# Patient Record
Sex: Male | Born: 1950 | Race: White | Hispanic: No | Marital: Married | Smoking: Current some day smoker
Health system: Southern US, Community
[De-identification: ages and names within clinical notes are randomized; demographics above are authoritative.]

## PROBLEM LIST (undated history)

## (undated) DIAGNOSIS — N184 Chronic kidney disease, stage 4 (severe): Secondary | ICD-10-CM

## (undated) DIAGNOSIS — I5022 Chronic systolic (congestive) heart failure: Secondary | ICD-10-CM

## (undated) DIAGNOSIS — I10 Essential (primary) hypertension: Secondary | ICD-10-CM

## (undated) DIAGNOSIS — Z95810 Presence of automatic (implantable) cardiac defibrillator: Secondary | ICD-10-CM

## (undated) DIAGNOSIS — I2510 Atherosclerotic heart disease of native coronary artery without angina pectoris: Secondary | ICD-10-CM

## (undated) DIAGNOSIS — Z953 Presence of xenogenic heart valve: Secondary | ICD-10-CM

## (undated) DIAGNOSIS — I4891 Unspecified atrial fibrillation: Secondary | ICD-10-CM

## (undated) DIAGNOSIS — E119 Type 2 diabetes mellitus without complications: Secondary | ICD-10-CM

## (undated) DIAGNOSIS — E1122 Type 2 diabetes mellitus with diabetic chronic kidney disease: Secondary | ICD-10-CM

## (undated) DIAGNOSIS — I9789 Other postprocedural complications and disorders of the circulatory system, not elsewhere classified: Secondary | ICD-10-CM

## (undated) HISTORY — DX: Presence of automatic (implantable) cardiac defibrillator: Z95.810

## (undated) HISTORY — DX: Unspecified atrial fibrillation: I48.91

## (undated) HISTORY — PX: CORONARY ANGIOPLASTY: SHX604

## (undated) HISTORY — DX: Other postprocedural complications and disorders of the circulatory system, not elsewhere classified: I97.89

## (undated) HISTORY — DX: Chronic kidney disease, stage 4 (severe): N18.4

## (undated) HISTORY — DX: Type 2 diabetes mellitus without complications: E11.9

## (undated) HISTORY — DX: Type 2 diabetes mellitus with diabetic chronic kidney disease: E11.22

## (undated) HISTORY — DX: Essential (primary) hypertension: I10

## (undated) HISTORY — DX: Atherosclerotic heart disease of native coronary artery without angina pectoris: I25.10

## (undated) HISTORY — DX: Presence of xenogenic heart valve: Z95.3

---

## 2013-06-12 HISTORY — PX: CARDIAC VALVE REPLACEMENT: SHX585

## 2013-06-12 HISTORY — PX: CORONARY ARTERY BYPASS GRAFT: SHX141

## 2015-08-18 ENCOUNTER — Telehealth: Payer: Self-pay | Admitting: Cardiovascular Disease

## 2015-08-18 NOTE — Telephone Encounter (Signed)
Received records from [facility] for appointment on 09/03/15 with Dr Sallyanne Kuster.  Records given to [facility] (medical records) for Dr Croitoru's schedule on 09/03/15. lp

## 2015-09-03 ENCOUNTER — Other Ambulatory Visit: Payer: Self-pay | Admitting: Cardiovascular Disease

## 2015-09-03 ENCOUNTER — Encounter: Payer: Self-pay | Admitting: Cardiovascular Disease

## 2015-09-03 ENCOUNTER — Ambulatory Visit (INDEPENDENT_AMBULATORY_CARE_PROVIDER_SITE_OTHER): Payer: Medicare HMO | Admitting: Cardiovascular Disease

## 2015-09-03 VITALS — BP 142/80 | HR 66 | Ht 71.0 in | Wt 271.4 lb

## 2015-09-03 DIAGNOSIS — E1122 Type 2 diabetes mellitus with diabetic chronic kidney disease: Secondary | ICD-10-CM

## 2015-09-03 DIAGNOSIS — I5043 Acute on chronic combined systolic (congestive) and diastolic (congestive) heart failure: Secondary | ICD-10-CM

## 2015-09-03 DIAGNOSIS — N184 Chronic kidney disease, stage 4 (severe): Secondary | ICD-10-CM | POA: Diagnosis not present

## 2015-09-03 DIAGNOSIS — R0602 Shortness of breath: Secondary | ICD-10-CM | POA: Diagnosis not present

## 2015-09-03 DIAGNOSIS — E785 Hyperlipidemia, unspecified: Secondary | ICD-10-CM

## 2015-09-03 DIAGNOSIS — Z952 Presence of prosthetic heart valve: Secondary | ICD-10-CM | POA: Diagnosis not present

## 2015-09-03 DIAGNOSIS — I9789 Other postprocedural complications and disorders of the circulatory system, not elsewhere classified: Secondary | ICD-10-CM

## 2015-09-03 DIAGNOSIS — I4891 Unspecified atrial fibrillation: Secondary | ICD-10-CM

## 2015-09-03 DIAGNOSIS — Z953 Presence of xenogenic heart valve: Secondary | ICD-10-CM

## 2015-09-03 DIAGNOSIS — I25118 Atherosclerotic heart disease of native coronary artery with other forms of angina pectoris: Secondary | ICD-10-CM

## 2015-09-03 DIAGNOSIS — I10 Essential (primary) hypertension: Secondary | ICD-10-CM

## 2015-09-03 DIAGNOSIS — Z95810 Presence of automatic (implantable) cardiac defibrillator: Secondary | ICD-10-CM

## 2015-09-03 MED ORDER — TORSEMIDE 20 MG PO TABS: 60.0000 mg | ORAL_TABLET | Freq: Two times a day (BID) | ORAL | Status: DC

## 2015-09-03 NOTE — Patient Instructions (Signed)
Medication Instructions: TAKE Torsemide 60 mg (3 tablets) twice daily starting today UNTIL YOU LOSE 9 LBS. Then resume 40 mg twice daily.  Your physician recommends that you weigh, daily, at the same time every day, and in the same amount of clothing. Please record your daily weights on the handout provided and bring it to your next appointment.  Call back on [place] with your weight.  Labwork: Your physician recommends that you return for lab work TODAY.  Testing/Procedures: NONE  Follow-up: Dr Sallyanne Kuster recommends that you schedule a follow-up appointment in 1 month.  If you need a refill on your cardiac medications before your next appointment, please call your pharmacy.

## 2015-09-03 NOTE — Progress Notes (Signed)
Patient ID: Dominic [name], male   DOB: [birth year], 65 y.o.   MRN: [id number]    Cardiology Office Note    Date:  09/05/2015   ID:  Dominic [name], DOB 08-18-1950, MRN [id number]  PCP:  Pcp Not In System  Cardiologist:   [name], MD   Chief Complaint  Patient presents with  . New Patient (Initial Visit)    has some chest tightness, has shortness of breath, has edema in legs,no pain or cramping in legs, has lightheadedness or dizziness, has fatigue    History of Present Illness:  Dominic [name] is a 65 y.o. male with severe and complex cardiac problems. He was previously cared for in [facility] and subsequently at [facility]. Due to changes in his insurance coverage he can no longer be followed at [facility] and is here to establish new cardiology care.  Mr. Paschen has severe ischemic cardiomyopathy with left ventricular ejection fraction most recently estimated to be approximately 30% by echocardiogram performed in October 2016. He describes septal and apical akinesis with global hypokinesis He had evidence of restrictive filling as well as moderate right ventricular systolic dysfunction and moderate aortic insufficiency. He has been admitted 3 times in the last 6 months with acute heart failure exacerbation. Most recently he was hospitalized in January for acute renal insufficiency related to acute watery diarrhea and continued treatment with diuretics and RAAS inhibitors. Chronically, he seems to have class IIIB functional status. As far as I can tell from results from his cardiac catheterization and his weights doing subsequent admissions, his dry weight appears to be 257-260 pounds.  He has an extensive history of coronary artery disease. He tells me that he thinks he has had 9 cardiac catheterizations and 5 stents. At least on one admission he had 2 episodes of cardiac arrest. I think this was during his hospitalization for bypass surgery/valve replacement.    He underwent bypass surgery in [place] in 2015 with synchronous mitral valve replacement with a biological prosthesis. He has a history of previous stent placed in the LAD artery, type of Delia Chimes date of procedure unknown. He underwent placement of a bare metal stent to the ostium of the left circumflex coronary artery in September 2015. He subsequently underwent placement of 2 drug-eluting stents at [facility] in October 2016, both drug-eluting stents, placed in the right coronary artery and left circumflex coronary artery respectively. At the time of that cardiac catheterization he had 50% stenosis in the distal left main, 90% proximal LAD, 70% in-stent restenosis proximal LAD, patent LIMA to distal LAD, 80% RCA. No mention is made of his second bypass graft, which I presume is occluded By history today, he appears to have infrequent episodes of angina pectoris, mostly associated with worsening shortness of breath during heart failure exacerbation. He is on combination aspirin/clopidogrel.  At the time of his bypass surgery received a 31 mm [employer] bioprosthetic valve. By echocardiogram last October 2016 the mean transvalvular gradient was 5 mmHg. The same echocardiogram described a trileaflet unrestricted aortic valve with moderate insufficiency.  His chart describes a history of postoperative atrial fibrillation. He is not on anticoagulants. He takes low-dose amiodarone.  He has a history of left bundle branch block and he received a CRT-D [employer] device on 07/01/2015. Interrogation of the device today shows no interval episodes of sustained or nonsustained ventricular tachycardia and no atrial fibrillation. Battery is at beginning of life in the last charge time was 9 seconds.  There is 100% biventricular pacing as well as 34% atrial pacing. Heart rate histograms appear appropriate for his very sedentary lifestyle. Today the presenting rhythm was atrial sensed,  biventricular paced. The QRS complex has a small initial positive deflection in the septal leads, the overall QRS duration is around 168 ms. Lead parameters are all excellent: - Atrial lead sensed P wave 4.0 mV, impedance 703 ohm, threshold 0.7 V at 0.4 ms - R Ventricular lead R-wave 8.1 mV, impedance 366 ohm, threshold 0.7 V at 0.4 ms, high-voltage impedance 60 ohm - L Ventricular lead R-wave 10 mV, impedance 575 ohms, threshold 1.5 V is 0.4 ms.  He has advanced chronic kidney disease. With recent baseline creatinine around 2.0. During his hospitalization in January 2017 the peak creatinine reached 4.6. He has type 2 diabetes mellitus and does not require insulin, complicated by kidney disease and neuropathy. He is still on metformin although previous notes expressed concern regarding this medication in the setting of severe cardiac and renal disease. He takes atorvastatin and a relatively low dose. He has long-standing treated hypertension. He is a former smoker and has a history of COPD and takes bronchodilators. He has a history of migraine headaches treated with Fioricet.  Today he complains of shortness of breath with minimal activity such as walking in the house. He has difficulty lying down in bed and sleeps sitting up. He has gained approximately 9 pounds since his last hospital discharge. He has only mild ankle edema, limited to the left leg where saphenous vein graft harvesting was performed. He does not have any swelling in his right ankle. He denies syncope and palpitations. He has infrequent episodes of angina pectoris, usually when his breathing becomes very difficult. He denies focal neurological complaints and he has not had active bleeding. His diarrhea is not currently a problem. It may have been related to treatment with Rapaflo. He denies cough, hemoptysis, abdominal pain, intolerance to heat or cold, polyuria/polydipsia. He has occasional dizziness and lightheadedness especially with  changes in position. He complains of severe fatigue.  He is carefully monitoring his weight at home. Today his home scale showed 265 pounds. Our office scale shows 271 pounds. He understands the need for sodium restriction and follows it carefully for the most part. He has a weakness for hot dogs.   Past Medical History  Diagnosis Date  . History of mitral valve replacement with bioprosthetic valve 09/05/2015    31 mm Edwards 2015, Dr. Jerelene Redden  . Acute on chronic combined systolic (congestive) and diastolic (congestive) heart failure ([place]) 09/05/2015  . CAD (coronary artery disease) 09/05/2015    CABG 2015 PCI-BMS ostial LCX Sept 2015 04/05/2015 cath 50% distal LM, 90% prox LAD, old stent prox LAD 70% ISR, patent LIMA-LAD, 80% prox RCA PCI-DES x2 RCA and LCX   . Biventricular ICD (implantable cardioverter-defibrillator) - [facility] 09/05/2015    Jul 01, 2015 [facility] Dynagen X4  . CKD stage 4 due to type 2 diabetes mellitus ([place]) 09/05/2015  . Essential hypertension 09/05/2015  . Diabetes mellitus, type 2 ([place]) 09/05/2015  . Postoperative atrial fibrillation ([place]) 09/05/2015    Past Surgical History  Procedure Laterality Date  . Coronary angioplasty    . Cardiac valve replacement  2015    74mm Edwards bioprosthesis  . Coronary artery bypass graft  2015    [facility], Dr. Jerelene Redden.    Outpatient Encounter Prescriptions as of 09/03/2015  Medication Sig  . albuterol (PROVENTIL HFA;VENTOLIN HFA) 108 (90 Base) MCG/ACT inhaler Inhale  2 puffs into the lungs every 6 (six) hours as needed for wheezing or shortness of breath.  [name] amiodarone (PACERONE) 200 MG tablet Take 200 mg by mouth daily.  [name] aspirin 81 MG tablet Take 81 mg by mouth daily.  [name] atorvastatin (LIPITOR) 20 MG tablet Take 20 mg by mouth daily.  . butalbital-acetaminophen-caffeine (FIORICET WITH CODEINE) 50-325-40-30 MG capsule Take 1 capsule by mouth every 4 (four) hours as needed for headache.  . Cholecalciferol (VITAMIN D3) 2000 units TABS Take 1 tablet by  mouth daily.  . clopidogrel (PLAVIX) 75 MG tablet Take 75 mg by mouth daily.  . DULoxetine (CYMBALTA) 60 MG capsule Take 120 mg by mouth daily.  . fluticasone (FLONASE) 50 MCG/ACT nasal spray Place 1 spray into both nostrils daily.  [name] gabapentin (NEURONTIN) 100 MG capsule Take 100 mg by mouth 3 (three) times daily.  [name] glucose blood test strip 1 each by Other route as needed for other. Use as instructed  . HYDROcodone-acetaminophen (NORCO/VICODIN) 5-325 MG tablet Take 1 tablet by mouth every 6 (six) hours as needed for moderate pain.  . metFORMIN (GLUCOPHAGE) 1000 MG tablet Take 1,000 mg by mouth 2 (two) times daily with a meal.  . metoprolol succinate (TOPROL-XL) 25 MG 24 hr tablet Take 25 mg by mouth daily.  . nitroGLYCERIN (NITROSTAT) 0.4 MG SL tablet Place 0.4 mg under the tongue every 5 (five) minutes as needed for chest pain.  [name] topiramate (TOPAMAX) 25 MG tablet Take 50 mg by mouth at bedtime.  . torsemide (DEMADEX) 20 MG tablet Take 2 tablets (40 mg total) by mouth 2 (two) times daily.  .     No facility-administered encounter medications on file as of 09/03/2015.    Allergies:   Hydrochlorothiazide; Amoxicillin-pot clavulanate; Clindamycin; Lisinopril; Meloxicam; Topiramate; and Tamsulosin   Social History   Social History  . Marital Status: Married    Spouse Name: N/A  . Number of Children: N/A  . Years of Education: N/A   Social History Main Topics  . Smoking status: Former Research scientist (life sciences)  . Smokeless tobacco: Never Used  . Alcohol Use: None  . Drug Use: None  . Sexual Activity: Not Asked   Other Topics Concern  . None   Social History Narrative     Family History:  The patient's family history is significant for the absence of known premature cardiac disease, valvular heart problems or arrhythmia.  ROS:   Please see the history of present illness.    ROS All other systems reviewed and are negative.   PHYSICAL EXAM:   VS:  BP 142/80 mmHg  Pulse 66  Ht 5\' 11"  (1.803  m)  Wt 123.095 kg (271 lb 6 oz)  BMI 37.87 kg/m2   GEN: Severely obese, well developed, in no acute distress HEENT: norma, no exophtalmos Neck: no JVD, carotid bruits, or masses, no goiter Cardiac: RRR, S2, S3 present; grade 2/6 holosystolic murmur at the left lower sternal border no diastolic murmurs, rubs, or gallops, 1+ left ankle edema, no edema in right ankle Respiratory:  clear to auscultation bilaterally, normal work of breathing; LV left subclavian defibrillator site with well-healed scar GI: soft, nontender, nondistended, + BS MS: no deformity or atrophy Skin: warm and dry, no rash Neuro:  Alert and Oriented x 3, Strength and sensation are intact Psych: euthymic mood, full affect  Wt Readings from Last 3 Encounters:  09/03/15 123.095 kg (271 lb 6 oz)      Studies/Labs Reviewed:   EKG:  EKG is ordered today.  The ekg ordered today demonstrates atrial sensed, ventricular paced, small R wave in lead V1 and V2 and predominantly positive in V6 and 1  Recent Labs: 09/03/2015: BUN 32*; Creat 2.73*; Potassium 4.4; Sodium 140   Lipid Panel October 2016 total cholesterol 154, LDL 85, HDL 45, triglycerides 123  Additional studies/ records that were reviewed today include:  Extensive review of records from [facility], multiple admissions, cardiac catheterization, echocardiogram, labs    ASSESSMENT:    1. Acute on chronic combined systolic (congestive) and diastolic (congestive) heart failure ([place])   2. Shortness of breath   3. Coronary artery disease involving native coronary artery of native heart with other form of angina pectoris ([place])   4. History of mitral valve replacement with bioprosthetic valve   5. Biventricular ICD (implantable cardioverter-defibrillator) - [facility]   6. CKD stage 4 due to type 2 diabetes mellitus ([place])   7. Essential hypertension   8. Type 2 diabetes mellitus with stage 4 chronic kidney disease, without long-term current use of insulin  ([place])   9. Hyperlipidemia   10. Postoperative atrial fibrillation (H[facility])      PLAN:  In order of problems listed above:  1. CHF: Class IIIB symptoms, some orthopnea, hypervolemic by physical exam and by weight. Increase torsemide to 60 mg twice a day. Continue daily weights and sodium restriction, recheck labs. Touch base in 3 days to see what his weight is and how symptoms have progressed. Caution to avoid acute worsening of renal function. 2. Dyspnea: Unclear to what degree his COPD may also be contributing to shortness of breath. I don't know how long he has been taking amiodarone, but needs to keep the possibility of amiodarone related side effects in mind. 3. CAD: He received 2 drug-eluting stents in October 2016 and should remain on dual antiplatelet therapy until October 2017 at least. If atrial fibrillation is documented with stop aspirin and start a true anticoagulant, probably warfarin in view of his volatile renal function. He has relatively mild angina pectoris, possibly related to volume overload. Reassess need for antianginal therapy after diuresis 4. S/p MVR: 31 mm bioprosthesis with normal function by recent echocardiogram. Mean gradient was still relatively high at 5 mmHg, even though he has a large prosthesis. Avoid tachycardia. 5. CRT-D: Normal function of his biventricular defibrillator. 100% biventricular pacing. No arrhythmia detected. Will transfer [place] follow-up to our device clinic 6. CKD stage 4: With recent episode of severe acute renal insufficiency. At that time spironolactone and ACE inhibitor were stopped. I don't believe it is safe to restart these. If blood pressure allows after diuresis, consider hydralazine/nitrates. 7. HTN: Blood pressure rather high today, reassess after diuresis. Probably add BiDil equivalent. 8. DM: Appears to be well controlled on metformin. Hemoglobin A1c 6.2% in January 2017 9. HLP: His lipid profile is not bad, but ideally his LDL  cholesterol should be less than 70 and last October LDL was 85. Will reevaluate at his next appointment. 10. AFib: I have no details about the diagnosis. Obviously he is at very high embolic risk, but he is not on anticoagulants. It is possible that the decision was made to stop anticoagulants when he received his drug-eluting stents and was placed on aspirin and clopidogrel. It is also possible that the burden of atrial fibrillation was very low and was felt safer to keep him off anticoagulation. We'll take advantage of the fact that he has a rhythm device in place and will  only initiate anticoagulation if he develops atrial fibrillation. Continue amiodarone for now, since he seems to be at very high risk for arrhythmia recurrence but the risk/benefit of this agent should be periodically reviewed. Check liver function tests and TSH at least twice yearly. TSH 3.12 in July 2016. Normal LFTs January 2017.    Medication Adjustments/Labs and Tests Ordered: Current medicines are reviewed at length with the patient today.  Concerns regarding medicines are outlined above.  Medication changes, Labs and Tests ordered today are listed in the Patient Instructions below. Patient Instructions  Medication Instructions: TAKE Torsemide 60 mg (3 tablets) twice daily starting today UNTIL YOU LOSE 9 LBS. Then resume 40 mg twice daily.  Your physician recommends that you weigh, daily, at the same time every day, and in the same amount of clothing. Please record your daily weights on the handout provided and bring it to your next appointment.  Call back on [place] with your weight.  Labwork: Your physician recommends that you return for lab work TODAY.  Testing/Procedures: NONE  Follow-up: Dr Sallyanne Kuster recommends that you schedule a follow-up appointment in 1 month.  If you need a refill on your cardiac medications before your next appointment, please call your pharmacy.       Mikael Spray, MD    09/05/2015 11:10 AM    [address], [place], [place]  [address] Phone: [phone]; Fax: [phone]

## 2015-09-04 LAB — BASIC METABOLIC PANEL
BUN: 32 mg/dL — ABNORMAL HIGH (ref 7–25)
CHLORIDE: 99 mmol/L (ref 98–110)
CO2: 28 mmol/L (ref 20–31)
CREATININE: 2.73 mg/dL — AB (ref 0.70–1.25)
Calcium: 8.9 mg/dL (ref 8.6–10.3)
Glucose, Bld: 94 mg/dL (ref 65–99)
POTASSIUM: 4.4 mmol/L (ref 3.5–5.3)
SODIUM: 140 mmol/L (ref 135–146)

## 2015-09-04 LAB — BRAIN NATRIURETIC PEPTIDE: BRAIN NATRIURETIC PEPTIDE: 90.8 pg/mL (ref <100)

## 2015-09-05 ENCOUNTER — Encounter: Payer: Self-pay | Admitting: Cardiovascular Disease

## 2015-09-05 DIAGNOSIS — I480 Paroxysmal atrial fibrillation: Secondary | ICD-10-CM | POA: Insufficient documentation

## 2015-09-05 DIAGNOSIS — I2510 Atherosclerotic heart disease of native coronary artery without angina pectoris: Secondary | ICD-10-CM

## 2015-09-05 DIAGNOSIS — I25118 Atherosclerotic heart disease of native coronary artery with other forms of angina pectoris: Secondary | ICD-10-CM | POA: Insufficient documentation

## 2015-09-05 DIAGNOSIS — E1122 Type 2 diabetes mellitus with diabetic chronic kidney disease: Secondary | ICD-10-CM | POA: Insufficient documentation

## 2015-09-05 DIAGNOSIS — E669 Obesity, unspecified: Secondary | ICD-10-CM

## 2015-09-05 DIAGNOSIS — I5022 Chronic systolic (congestive) heart failure: Secondary | ICD-10-CM

## 2015-09-05 DIAGNOSIS — E119 Type 2 diabetes mellitus without complications: Secondary | ICD-10-CM

## 2015-09-05 DIAGNOSIS — Z953 Presence of xenogenic heart valve: Secondary | ICD-10-CM

## 2015-09-05 DIAGNOSIS — I5043 Acute on chronic combined systolic (congestive) and diastolic (congestive) heart failure: Secondary | ICD-10-CM | POA: Insufficient documentation

## 2015-09-05 DIAGNOSIS — I9789 Other postprocedural complications and disorders of the circulatory system, not elsewhere classified: Secondary | ICD-10-CM

## 2015-09-05 DIAGNOSIS — Z95810 Presence of automatic (implantable) cardiac defibrillator: Secondary | ICD-10-CM

## 2015-09-05 DIAGNOSIS — I4891 Unspecified atrial fibrillation: Secondary | ICD-10-CM

## 2015-09-05 DIAGNOSIS — I10 Essential (primary) hypertension: Secondary | ICD-10-CM

## 2015-09-05 DIAGNOSIS — E1169 Type 2 diabetes mellitus with other specified complication: Secondary | ICD-10-CM | POA: Insufficient documentation

## 2015-09-05 DIAGNOSIS — N184 Chronic kidney disease, stage 4 (severe): Secondary | ICD-10-CM

## 2015-09-05 HISTORY — DX: Type 2 diabetes mellitus without complications: E11.9

## 2015-09-05 HISTORY — DX: Presence of xenogenic heart valve: Z95.3

## 2015-09-05 HISTORY — DX: Chronic kidney disease, stage 4 (severe): E11.22

## 2015-09-05 HISTORY — DX: Atherosclerotic heart disease of native coronary artery without angina pectoris: I25.10

## 2015-09-05 HISTORY — DX: Unspecified atrial fibrillation: I48.91

## 2015-09-05 HISTORY — DX: Chronic systolic (congestive) heart failure: I50.22

## 2015-09-05 HISTORY — DX: Presence of automatic (implantable) cardiac defibrillator: Z95.810

## 2015-09-05 HISTORY — DX: Essential (primary) hypertension: I10

## 2015-09-06 ENCOUNTER — Telehealth: Payer: Self-pay | Admitting: Cardiovascular Disease

## 2015-09-06 NOTE — Telephone Encounter (Signed)
Returned call to patient, he voiced understanding of recommendations & will call if further concerns, or if still orthopneic when <= 257 lbs.

## 2015-09-06 NOTE — Telephone Encounter (Signed)
Mrs. Dominic [name] is calling to give Mr. Dominic [name] weight for today (Dr. Sallyanne Kuster wanted for today ) 263.8 .[name] Thanks

## 2015-09-06 NOTE — Telephone Encounter (Signed)
Kidney tests slightly worse from early February, but not far from baseline. BNP normal, suggesting he is not very fluid overloaded. I think we should shoot for a dry weight of 257 lb on his home scale, as long as he does not have orthopnea at that weight. "sliding scale" torsemide: - If morning weight is over 257 lb, take torsemide 60 mg twice daily - if morning weight is 257 lb or lower, take torsemide 40 mg twice daily - if morning weight is under 252 lb, do not take torsemide and call office.

## 2015-09-06 NOTE — Telephone Encounter (Signed)
Re: office visit 3/24 - instructed to call back w/ wt changes after torsemide increase.  263.8 on home scale today & was 271 in office friday.

## 2015-09-07 ENCOUNTER — Telehealth: Payer: Self-pay | Admitting: *Deleted

## 2015-09-07 NOTE — Telephone Encounter (Signed)
Spoke with pt wife, pt weighted 264.4 lb, this is up 7 oz since yesterday. He is having SOB and is having some orthopnea. He has swelling in feet and ankles. His torsemide is 60 mg twice daily. Will make dr croitoru aware

## 2015-09-07 NOTE — Telephone Encounter (Signed)
Spoke with pt wife, aware of dr croitoru's recommendations. °

## 2015-09-07 NOTE — Telephone Encounter (Signed)
Received call from [facility]. Spoke w/ Katrine Coho there who states she is faxing over a referral form. She infoms me she spoke to someone at our office yesterday about a referral form - Sunday Spillers believes this is for a request to set pt up w/ cardiac rehab services.  I confirmed fax number & informed her I would pass request to physician.  Spoke w/ Dr. Sallyanne Kuster who will review form & set up for appropriate services.

## 2015-09-07 NOTE — Telephone Encounter (Signed)
Let's wait another 48 hours before we decide to increase diuretic further. Please touch base again with weight on Thursday morning.

## 2015-09-07 NOTE — Telephone Encounter (Signed)
-----   Message from [name], MD sent at 09/05/2015 10:17 AM EDT ----- Kidney tests slightly worse from early February, but not far from baseline. BNP normal, suggesting he is not very fluid overloaded. Please calh him Monday and ask him what he weighs on home scale. I think we should shoot for a dry weight of 257 lb on his home scale, as long as he does not have orthopnea at that weight.

## 2015-09-08 LAB — CUP PACEART INCLINIC DEVICE CHECK
Date Time Interrogation Session: 20170329133303
HighPow Impedance: 60 Ohm
Lead Channel Impedance Value: 575 Ohm
Lead Channel Impedance Value: 703 Ohm
Lead Channel Sensing Intrinsic Amplitude: 4 mV
Lead Channel Setting Pacing Amplitude: 3.5 V
Lead Channel Setting Pacing Pulse Width: 0.4 ms
MDC IDC MSMT BATTERY REMAINING LONGEVITY: 96 mo
MDC IDC MSMT LEADCHNL RV IMPEDANCE VALUE: 366 Ohm
MDC IDC SET LEADCHNL LV PACING AMPLITUDE: 3.5 V
MDC IDC SET LEADCHNL LV PACING PULSEWIDTH: 0.4 ms
MDC IDC SET LEADCHNL RV PACING AMPLITUDE: 3.5 V
MDC IDC STAT BRADY RA PERCENT PACED: 34 %
MDC IDC STAT BRADY RV PERCENT PACED: 100 %
Pulse Gen Serial Number: 160713

## 2015-09-08 NOTE — Telephone Encounter (Signed)
Follow up      Pt c/o of Chest Pain: STAT if CP now or developed within 24 hours  1. Are you having CP right now? yes 2. Are you experiencing any other symptoms (ex. SOB, nausea, vomiting, sweating)? sob 3. How long have you been experiencing CP?  Since yesterday 4. Is your CP continuous or coming and going?    5. Have you taken Nitroglycerin? ?

## 2015-09-08 NOTE — Telephone Encounter (Signed)
Spoke with pt and wife. Pt has been having CP one and off yesterday and today with pain level 6 and 7. Yesterday it eventually stopped and he was able to go to bed last night about midnight. Last night he did take two 81 mg of ASA that seemed to help. Pt stated today it started after his nap, it didn't wake him up from his nap though. Pt stated that he is not having pain at the moment and he has not tried to take his Nitro. Pt stated he does not want to take his Nitro as it make his wife worry. Pt stated it is not radiating anywhere and his SOB is at baseline.  Pt going to call us if has additional questions and reminded on how to take Nitro if needed. Pt verbalized understanding, no additional questions at this time.

## 2015-09-10 NOTE — Telephone Encounter (Signed)
Please confirm that weight: 236 lb seems inaccurate. Was supposed to be 263? Continue taking the 60 mg twice daily of furosemide until he reaches about 257 pounds. If chest pain lasts more than 30 minutes is probably best that he go to the [facility] emergency room. He should try nitroglycerin first

## 2015-09-10 NOTE — Telephone Encounter (Signed)
Weight is 263 lb today. Spoke with pt, he voiced understanding of instructions from dr croitoru. Patient voiced understanding of when to go to the ER for chest pain.

## 2015-09-10 NOTE — Telephone Encounter (Signed)
Spoke with pt, his weight today is 236 lb. His weight got up to 264.4 lb yesterday and he took 3 pills two times yesterday. His SOB is the same, he has swelling in his feet and legs by the end of the day. He denies orthopnea. Will make dr croitoru aware.

## 2015-10-07 ENCOUNTER — Ambulatory Visit (INDEPENDENT_AMBULATORY_CARE_PROVIDER_SITE_OTHER): Payer: Medicare HMO | Admitting: Cardiovascular Disease

## 2015-10-07 ENCOUNTER — Telehealth: Payer: Self-pay | Admitting: Cardiovascular Disease

## 2015-10-07 ENCOUNTER — Encounter: Payer: Self-pay | Admitting: Cardiovascular Disease

## 2015-10-07 VITALS — BP 126/68 | HR 66 | Ht 72.0 in | Wt 273.0 lb

## 2015-10-07 DIAGNOSIS — I5043 Acute on chronic combined systolic (congestive) and diastolic (congestive) heart failure: Secondary | ICD-10-CM | POA: Diagnosis not present

## 2015-10-07 DIAGNOSIS — E669 Obesity, unspecified: Secondary | ICD-10-CM

## 2015-10-07 DIAGNOSIS — Z79899 Other long term (current) drug therapy: Secondary | ICD-10-CM | POA: Diagnosis not present

## 2015-10-07 DIAGNOSIS — I10 Essential (primary) hypertension: Secondary | ICD-10-CM | POA: Diagnosis not present

## 2015-10-07 DIAGNOSIS — E119 Type 2 diabetes mellitus without complications: Secondary | ICD-10-CM

## 2015-10-07 DIAGNOSIS — I25118 Atherosclerotic heart disease of native coronary artery with other forms of angina pectoris: Secondary | ICD-10-CM | POA: Diagnosis not present

## 2015-10-07 DIAGNOSIS — E1122 Type 2 diabetes mellitus with diabetic chronic kidney disease: Secondary | ICD-10-CM

## 2015-10-07 DIAGNOSIS — I9789 Other postprocedural complications and disorders of the circulatory system, not elsewhere classified: Secondary | ICD-10-CM

## 2015-10-07 DIAGNOSIS — Z952 Presence of prosthetic heart valve: Secondary | ICD-10-CM

## 2015-10-07 DIAGNOSIS — Z95810 Presence of automatic (implantable) cardiac defibrillator: Secondary | ICD-10-CM

## 2015-10-07 DIAGNOSIS — N184 Chronic kidney disease, stage 4 (severe): Secondary | ICD-10-CM

## 2015-10-07 DIAGNOSIS — I4891 Unspecified atrial fibrillation: Secondary | ICD-10-CM

## 2015-10-07 DIAGNOSIS — E1169 Type 2 diabetes mellitus with other specified complication: Secondary | ICD-10-CM

## 2015-10-07 DIAGNOSIS — Z953 Presence of xenogenic heart valve: Secondary | ICD-10-CM

## 2015-10-07 DIAGNOSIS — E785 Hyperlipidemia, unspecified: Secondary | ICD-10-CM

## 2015-10-07 MED ORDER — METOLAZONE 2.5 MG PO TABS: 2.5000 mg | ORAL_TABLET | Freq: Every day | ORAL | Status: DC

## 2015-10-07 NOTE — Telephone Encounter (Signed)
No, that is what I expected. Plan as discussed in clinic. Thank you

## 2015-10-07 NOTE — Telephone Encounter (Signed)
Pt was told to call back with the mg on his Potassium , and he takes 20mg 

## 2015-10-07 NOTE — Patient Instructions (Signed)
Your physician has recommended you make the following change in your medication...  1. TAKE metolazone 2.5mg  30 minutes prior to morning dose of torsemide (demadex) only when weight is greater than 260lbs 2. If you take metolazone - you will need to take a potassium pill with this  Your physician recommends that you return for lab work in: [place] physician recommends that you schedule a follow-up appointment in: [place] with Dr. Sallyanne Kuster  Please continue monitoring your weights  Please call [phone] with you potassium dose when you get home

## 2015-10-07 NOTE — Telephone Encounter (Signed)
Returned call to patient, he verbalized understanding.

## 2015-10-07 NOTE — Telephone Encounter (Signed)
Returned call for clarification. Pt notes he has the 52meq KCl on hand. Please advise if further clarification needed for dosing w taking metolazone.

## 2015-10-07 NOTE — Progress Notes (Signed)
Patient ID: Dominic [name], male   DOB: 1951-05-20, 65 y.o.   MRN: [id number]  Patient ID: Dominic [name], male   DOB: 11/05/1950, 65 y.o.   MRN: [id number]    Cardiology Office Note    Date:  09/05/2015   ID:  Dominic [name], DOB 17-Jan-1951, MRN [id number]  PCP:  Pcp Not In System  Cardiologist:   Dominic Klein, MD   Chief Complaint  Patient presents with  . New Patient (Initial Visit)    has some chest tightness, has shortness of breath, has edema in legs,no pain or cramping in legs, has lightheadedness or dizziness, has fatigue    History of Present Illness:  Dominic [name] is a 65 y.o. male with severe and complex cardiac problems. He was previously cared for in [facility] and subsequently at [facility]. Due to changes in his insurance coverage he can no longer be followed at [facility] and is here to establish new cardiology care since March 2017.  At his last appointment he was complaining of dyspnea and edema and weight gain. We increased the dose of his loop diuretic. A phone conversation suggested that he had lost some fluid, but it is apparent from the discussion today that he never really had a good diuretic response. The reduction in weight was simply due to a discrepancy between his home scale and our office scale. Our office scale seems to overestimate his home weight by roughly 8 pounds. He currently actually weighs 2 pounds more than he did at his last appointment in late March. He is quite frustrated with his inability to lose fluid despite reporting good compliance with sodium restriction. He continues to sleep on 2 pillows due to orthopnea. His legs only swell towards the end of the day. He has not had angina pectoris but does have occasional dizziness. He remember feelings best back in February when his weight was around 258 pounds on his home scale roughly 7 pounds less than it is today.   He denies cough, hemoptysis, abdominal pain, intolerance to  heat or cold, polyuria/polydipsia. He has occasional dizziness and lightheadedness especially with changes in position. He complains of severe fatigue.  He is carefully monitoring his weight at home and brings a detailed weight log, with daily checks. Today his home scale showed 265 pounds. Our office scale shows 273 pounds.   Interrogation of his CRT-D device shows normal function. Estimated generator longevity is 8 years. Last charge time was 9 seconds. All lead parameters appear to be excellent. The left ventricular lead pacing configuration is LV ring4-can. His heart rate variability footprint does show improvement compared with January. There is 100% by V pacing. There is 25% atrial pacing. There has been no atrial fibrillation.  Dominic [name] has severe ischemic cardiomyopathy with left ventricular ejection fraction most recently estimated to be approximately 30% by echocardiogram performed in October 2016. He describes septal and apical akinesis with global hypokinesis He had evidence of restrictive filling as well as moderate right ventricular systolic dysfunction and moderate aortic insufficiency. He has been admitted 3 times in the last 6 months with acute heart failure exacerbation. Most recently he was hospitalized in January for acute renal insufficiency related to acute watery diarrhea and continued treatment with diuretics and RAAS inhibitors. Chronically, he seems to have class IIIB functional status. As far as I can tell from results from his cardiac catheterization and his weights doing subsequent admissions, his dry weight appears to be 257-260 pounds.  He has an  extensive history of coronary artery disease. He tells me that he thinks he has had 9 cardiac catheterizations and 5 stents. At least on one admission he had 2 episodes of cardiac arrest. I think this was during his hospitalization for bypass surgery/valve replacement.   He underwent bypass surgery in [place] in 2015 with  synchronous mitral valve replacement with a biological prosthesis. He has a history of previous stent placed in the LAD artery, type of Delia Chimes date of procedure unknown. He underwent placement of a bare metal stent to the ostium of the left circumflex coronary artery in September 2015. He subsequently underwent placement of 2 drug-eluting stents at [facility] in October 2016, both drug-eluting stents, placed in the right coronary artery and left circumflex coronary artery respectively. At the time of that cardiac catheterization he had 50% stenosis in the distal left main, 90% proximal LAD, 70% in-stent restenosis proximal LAD, patent LIMA to distal LAD, 80% RCA. No mention is made of his second bypass graft, which I presume is occluded By history today, he appears to have infrequent episodes of angina pectoris, mostly associated with worsening shortness of breath during heart failure exacerbation. He is on combination aspirin/clopidogrel.  At the time of his bypass surgery received a 31 mm [employer] bioprosthetic valve. By echocardiogram last October 2016 the mean transvalvular gradient was 5 mmHg. The same echocardiogram described a trileaflet unrestricted aortic valve with moderate insufficiency.  His chart describes a history of postoperative atrial fibrillation. He is not on anticoagulants. He takes low-dose amiodarone.  He has a history of left bundle branch block and he received a CRT-D [employer] device on 07/01/2015. Interrogation of the device today shows no interval episodes of sustained or nonsustained ventricular tachycardia and no atrial fibrillation. Battery is at beginning of life in the last charge time was 9 seconds. There is 100% biventricular pacing as well as 34% atrial pacing. Heart rate histograms appear appropriate for his very sedentary lifestyle. Today the presenting rhythm was atrial sensed, biventricular paced. The QRS complex has a small initial positive  deflection in the septal leads, the overall QRS duration is around 168 ms. Lead parameters are all excellent: - Atrial lead sensed P wave 4.0 mV, impedance 703 ohm, threshold 0.7 V at 0.4 ms - R Ventricular lead R-wave 8.1 mV, impedance 366 ohm, threshold 0.7 V at 0.4 ms, high-voltage impedance 60 ohm - L Ventricular lead R-wave 10 mV, impedance 575 ohms, threshold 1.5 V is 0.4 ms.  He has advanced chronic kidney disease. With recent baseline creatinine around 2.0. During his hospitalization in January 2017 the peak creatinine reached 4.6. He has type 2 diabetes mellitus and does not require insulin, complicated by kidney disease and neuropathy. He is still on metformin although previous notes expressed concern regarding this medication in the setting of severe cardiac and renal disease. He takes atorvastatin and a relatively low dose. He has long-standing treated hypertension. He is a former smoker and has a history of COPD and takes bronchodilators. He has a history of migraine headaches treated with Fioricet.     Past Medical History  Diagnosis Date  . History of mitral valve replacement with bioprosthetic valve 09/05/2015    31 mm Edwards 2015, Dr. Jerelene Redden  . Acute on chronic combined systolic (congestive) and diastolic (congestive) heart failure ([place]) 09/05/2015  . CAD (coronary artery disease) 09/05/2015    CABG 2015 PCI-BMS ostial LCX Sept 2015 04/05/2015 cath 50% distal LM, 90% prox LAD, old  stent prox LAD 70% ISR, patent LIMA-LAD, 80% prox RCA PCI-DES x2 RCA and LCX   . Biventricular ICD (implantable cardioverter-defibrillator) - [facility] 09/05/2015    Jul 01, 2015 [facility] Dynagen X4  . CKD stage 4 due to type 2 diabetes mellitus ([place]) 09/05/2015  . Essential hypertension 09/05/2015  . Diabetes mellitus, type 2 ([place]) 09/05/2015  . Postoperative atrial fibrillation ([place]) 09/05/2015    Past Surgical History  Procedure Laterality Date  . Coronary angioplasty    . Cardiac valve replacement  2015     11mm Edwards bioprosthesis  . Coronary artery bypass graft  2015    [facility], Dr. Jerelene Redden.    Outpatient Encounter Prescriptions as of 09/03/2015  Medication Sig  . albuterol (PROVENTIL HFA;VENTOLIN HFA) 108 (90 Base) MCG/ACT inhaler Inhale 2 puffs into the lungs every 6 (six) hours as needed for wheezing or shortness of breath.  [name] amiodarone (PACERONE) 200 MG tablet Take 200 mg by mouth daily.  [name] aspirin 81 MG tablet Take 81 mg by mouth daily.  [name] atorvastatin (LIPITOR) 20 MG tablet Take 20 mg by mouth daily.  . butalbital-acetaminophen-caffeine (FIORICET WITH CODEINE) 50-325-40-30 MG capsule Take 1 capsule by mouth every 4 (four) hours as needed for headache.  . Cholecalciferol (VITAMIN D3) 2000 units TABS Take 1 tablet by mouth daily.  . clopidogrel (PLAVIX) 75 MG tablet Take 75 mg by mouth daily.  . DULoxetine (CYMBALTA) 60 MG capsule Take 120 mg by mouth daily.  . fluticasone (FLONASE) 50 MCG/ACT nasal spray Place 1 spray into both nostrils daily.  [name] gabapentin (NEURONTIN) 100 MG capsule Take 100 mg by mouth 3 (three) times daily.  [name] glucose blood test strip 1 each by Other route as needed for other. Use as instructed  . HYDROcodone-acetaminophen (NORCO/VICODIN) 5-325 MG tablet Take 1 tablet by mouth every 6 (six) hours as needed for moderate pain.  . metFORMIN (GLUCOPHAGE) 1000 MG tablet Take 1,000 mg by mouth 2 (two) times daily with a meal.  . metoprolol succinate (TOPROL-XL) 25 MG 24 hr tablet Take 25 mg by mouth daily.  . nitroGLYCERIN (NITROSTAT) 0.4 MG SL tablet Place 0.4 mg under the tongue every 5 (five) minutes as needed for chest pain.  [name] topiramate (TOPAMAX) 25 MG tablet Take 50 mg by mouth at bedtime.  . torsemide (DEMADEX) 20 MG tablet Take 2 tablets (40 mg total) by mouth 2 (two) times daily.  .     No facility-administered encounter medications on file as of 09/03/2015.    Allergies:   Hydrochlorothiazide; Amoxicillin-pot clavulanate; Clindamycin; Lisinopril; Meloxicam;  Topiramate; and Tamsulosin   Social History   Social History  . Marital Status: Married    Spouse Name: N/A  . Number of Children: N/A  . Years of Education: N/A   Social History Main Topics  . Smoking status: Former Research scientist (life sciences)  . Smokeless tobacco: Never Used  . Alcohol Use: None  . Drug Use: None  . Sexual Activity: Not Asked   Other Topics Concern  . None   Social History Narrative     Family History:  The patient's family history is significant for the absence of known premature cardiac disease, valvular heart problems or arrhythmia.  ROS:   Please see the history of present illness.    ROS All other systems reviewed and are negative.   PHYSICAL EXAM:   VS:  BP 142/80 mmHg  Pulse 66  Ht 5\' 11"  (1.803 m)  Wt 123.095 kg (271 lb 6 oz)  BMI  37.87 kg/m2   GEN: Severely obese, well developed, in no acute distress HEENT: norma, no exophtalmos Neck: no JVD, carotid bruits, or masses, no goiter Cardiac: RRR, S2, S3 present; grade 2/6 holosystolic murmur at the left lower sternal border no diastolic murmurs, rubs, or gallops, 1+ left ankle edema, no edema in right ankle Respiratory:  clear to auscultation bilaterally, normal work of breathing; LV left subclavian defibrillator site with well-healed scar GI: soft, nontender, nondistended, + BS MS: no deformity or atrophy Skin: warm and dry, no rash Neuro:  Alert and Oriented x 3, Strength and sensation are intact Psych: euthymic mood, full affect  Wt Readings from Last 3 Encounters:  09/03/15 123.095 kg (271 lb 6 oz)      Studies/Labs Reviewed:   EKG:  EKG is not ordered today.  The ekg ordered today demonstrates atrial sensed, ventricular paced, small R wave in lead V1 and V2 and predominantly positive in V6 and 1  Recent Labs: 09/03/2015: BUN 32*; Creat 2.73*; Potassium 4.4; Sodium 140   Lipid Panel October 2016 total cholesterol 154, LDL 85, HDL 45, triglycerides 123  Additional studies/ records that were reviewed  today include:  Extensive review of records from [facility], multiple admissions, cardiac catheterization, echocardiogram, labs    ASSESSMENT:    1. Acute on chronic combined systolic (congestive) and diastolic (congestive) heart failure ([place])   2. Shortness of breath   3. Coronary artery disease involving native coronary artery of native heart with other form of angina pectoris ([place])   4. History of mitral valve replacement with bioprosthetic valve   5. Biventricular ICD (implantable cardioverter-defibrillator) - [facility]   6. CKD stage 4 due to type 2 diabetes mellitus ([place])   7. Essential hypertension   8. Type 2 diabetes mellitus with stage 4 chronic kidney disease, without long-term current use of insulin ([place])   9. Hyperlipidemia   10. Postoperative atrial fibrillation ([facility])      PLAN:  In order of problems listed above:  1. CHF: Class IIIB symptoms, some orthopnea, hypervolemic by physical exam and by weight. At metolazone 2.5 mg in the morning before his torsemide, until his weight decreases by 9 pounds. Continue daily weights and sodium restriction, recheck labs. Touch base in 3 days to see what his weight is and how symptoms have progressed. Caution to avoid acute worsening of renal function. 2. Dyspnea: Unclear to what degree his COPD may also be contributing to shortness of breath. I don't know how long he has been taking amiodarone, but needs to keep the possibility of amiodarone related side effects in mind. 3. CAD: He received 2 drug-eluting stents in October 2016 and should remain on dual antiplatelet therapy until October 2017 at least. If atrial fibrillation is documented will stop aspirin and start a true anticoagulant, probably warfarin in view of his volatile renal function. He has relatively mild angina pectoris, possibly related to volume overload. Reassess need for antianginal therapy after diuresis 4. S/p MVR: 31 mm bioprosthesis with normal function by  recent echocardiogram. Mean gradient was still relatively high at 5 mmHg, even though he has a large prosthesis. Avoid tachycardia. 5. CRT-D: Normal function of his biventricular defibrillator. 100% biventricular pacing. No arrhythmia detected. Will transfer [place] follow-up to our device clinic 6. CKD stage 4: With recent episode of severe acute renal insufficiency. At that time spironolactone and ACE inhibitor were stopped. I don't believe it is safe to restart these. If blood pressure allows after diuresis, consider hydralazine/nitrates.  7. HTN: Blood pressure still a little high today, reassess after diuresis. Probably add BiDil equivalent. 8. DM: Appears to be well controlled on metformin. Hemoglobin A1c 6.2% in January 2017 9. HLP: His lipid profile is not bad, but ideally his LDL cholesterol should be less than 70 and last October LDL was 85. Will reevaluate at his next appointment. 10. AFib: I have no details about the diagnosis. Obviously he is at very high embolic risk, but he is not on anticoagulants. It is possible that the decision was made to stop anticoagulants when he received his drug-eluting stents and was placed on aspirin and clopidogrel. It is also possible that the burden of atrial fibrillation was very low and was felt safer to keep him off anticoagulation. We'll take advantage of the fact that he has a rhythm device in place and will only initiate anticoagulation if he develops atrial fibrillation. Continue amiodarone for now, since he seems to be at very high risk for arrhythmia recurrence but the risk/benefit of this agent should be periodically reviewed. Check liver function tests and TSH at least twice yearly. TSH 3.12 in July 2016. Normal LFTs January 2017.    Medication Adjustments/Labs and Tests Ordered: Current medicines are reviewed at length with the patient today.  Concerns regarding medicines are outlined above.  Medication changes, Labs and Tests ordered today are  listed in the Patient Instructions below. Patient Instructions  Medication Instructions: TAKE Torsemide 60 mg (3 tablets) twice daily starting today UNTIL YOU LOSE 9 LBS. Then resume 40 mg twice daily.  Your physician recommends that you weigh, daily, at the same time every day, and in the same amount of clothing. Please record your daily weights on the handout provided and bring it to your next appointment.  Call back on [place] with your weight.  Labwork: Your physician recommends that you return for lab work TODAY.  Testing/Procedures: NONE  Follow-up: Dr Sallyanne Kuster recommends that you schedule a follow-up appointment in 1 month.  If you need a refill on your cardiac medications before your next appointment, please call your pharmacy.       Mikael Spray, MD  09/05/2015 11:10 AM    [address], [place], [place]  [address] Phone: [phone]; Fax: [phone]

## 2015-10-08 DIAGNOSIS — E785 Hyperlipidemia, unspecified: Secondary | ICD-10-CM | POA: Insufficient documentation

## 2015-10-12 ENCOUNTER — Telehealth: Payer: Self-pay

## 2015-10-12 DIAGNOSIS — E785 Hyperlipidemia, unspecified: Secondary | ICD-10-CM

## 2015-10-12 NOTE — Telephone Encounter (Signed)
Called patient. Patient aware of additional blood work and will have labs drawn at [facility] lab in [place].  Patient has been taking "those fluids pills" (possibly Metolazone-started at last office visit) and his weight is down to 258lbs.

## 2015-10-12 NOTE — Telephone Encounter (Signed)
-----   Message from [name], MD sent at 10/08/2015  3:27 PM EDT ----- Please add a lipid profile to his upcoming lab draw

## 2015-10-12 NOTE — Telephone Encounter (Signed)
Thanks

## 2015-10-21 LAB — BASIC METABOLIC PANEL
BUN: 73 mg/dL — ABNORMAL HIGH (ref 7–25)
CHLORIDE: 91 mmol/L — AB (ref 98–110)
CO2: 32 mmol/L — AB (ref 20–31)
CREATININE: 3.68 mg/dL — AB (ref 0.70–1.25)
Calcium: 9.1 mg/dL (ref 8.6–10.3)
Glucose, Bld: 113 mg/dL — ABNORMAL HIGH (ref 65–99)
Potassium: 3.2 mmol/L — ABNORMAL LOW (ref 3.5–5.3)
Sodium: 137 mmol/L (ref 135–146)

## 2015-10-21 LAB — LIPID PANEL
CHOLESTEROL: 185 mg/dL (ref 125–200)
HDL: 34 mg/dL — ABNORMAL LOW (ref >=40)
LDL CALC: 104 mg/dL (ref <130)
TRIGLYCERIDES: 237 mg/dL — AB (ref <150)
Total CHOL/HDL Ratio: 5.4 Ratio — ABNORMAL HIGH (ref <=5.0)
VLDL: 47 mg/dL — ABNORMAL HIGH (ref <30)

## 2015-10-28 ENCOUNTER — Telehealth: Payer: Self-pay | Admitting: Cardiovascular Disease

## 2015-10-28 NOTE — Telephone Encounter (Signed)
New message ° ° ° ° °Returning a call to the nurse °

## 2015-10-28 NOTE — Telephone Encounter (Signed)
Message  Received: 2 days ago    [name], MD  Dionne Bucy Truitt, CMA            It could well have been a seizure. Was any imaging test or Neurology follow up recommended by PCP?  MCr       Previous Messages     ----- Message -----   From: Dionne Bucy Truitt, CMA   Sent: 10/26/2015  3:42 PM    To: [name], MD   Wife and patient returned phone call.  Reviewed results and recommendations with them. Patient states that today he weighed 262lbs, 263lbs yesterday, and 265lbs on Saturday. He has not been taking the metolazone "for a while." He is hesitant to hold Torsemide due to the fact he is still gaining weight. He takes 2 tablets of the torsemide usually (will take 3 tablets if he is gaining).  Wife/patient report dizziness "even when he gets up slow, he is always dizzy." Was seen by PCP and was told he may have had a mini-stroke. Also reports an episode that "scared her," patient fell asleep in swing. He turned his head, his right arm started shaking "almost like a seizure." Wife states patient looked at her and "his eyes were just weird." She woke him up. He has no recollection of this event. Patient complains that since this episode, his jaw has been tight on the right side and has been experiencing very bad headaches.

## 2015-10-28 NOTE — Telephone Encounter (Signed)
Message  Received: Today    Dionne Bucy Helvi Royals, CMA  [name], MD           PCP did not order any imaging or referral.  Wife states his stomach in swelling and his breathing is not that good. He does bare minimum. She reports that he has been sleeping a lot more during the day and he has taken 3 pain pills today.  I recommended that she take him to the hospital to which she replied, "He won't go."  Went to discuss with Cherene Altes, RN. Wife had hung phone up by the time I returned.  Returned call to wife. Left detailed voice message.

## 2015-10-29 ENCOUNTER — Emergency Department (HOSPITAL_COMMUNITY): Payer: Medicare HMO

## 2015-10-29 ENCOUNTER — Encounter (HOSPITAL_COMMUNITY)
Admission: EM | Disposition: A | Discharge status: Home / Self Care | Payer: Self-pay | Source: Home / Self Care | Attending: Cardiovascular Disease

## 2015-10-29 ENCOUNTER — Inpatient Hospital Stay (HOSPITAL_COMMUNITY)
Admission: EM | Admit: 2015-10-29 | Discharge: 2015-10-31 | DRG: 287 | Disposition: A | Discharge status: Home / Self Care | Payer: Medicare HMO | Attending: Cardiovascular Disease | Admitting: Cardiovascular Disease

## 2015-10-29 ENCOUNTER — Encounter (HOSPITAL_COMMUNITY): Payer: Self-pay | Admitting: Emergency Medicine

## 2015-10-29 DIAGNOSIS — T502X5A Adverse effect of carbonic-anhydrase inhibitors, benzothiadiazides and other diuretics, initial encounter: Secondary | ICD-10-CM | POA: Diagnosis present

## 2015-10-29 DIAGNOSIS — N184 Chronic kidney disease, stage 4 (severe): Secondary | ICD-10-CM | POA: Diagnosis present

## 2015-10-29 DIAGNOSIS — Z7984 Long term (current) use of oral hypoglycemic drugs: Secondary | ICD-10-CM | POA: Diagnosis not present

## 2015-10-29 DIAGNOSIS — Z8249 Family history of ischemic heart disease and other diseases of the circulatory system: Secondary | ICD-10-CM | POA: Diagnosis not present

## 2015-10-29 DIAGNOSIS — I25708 Atherosclerosis of coronary artery bypass graft(s), unspecified, with other forms of angina pectoris: Secondary | ICD-10-CM

## 2015-10-29 DIAGNOSIS — E1122 Type 2 diabetes mellitus with diabetic chronic kidney disease: Secondary | ICD-10-CM | POA: Diagnosis present

## 2015-10-29 DIAGNOSIS — E861 Hypovolemia: Secondary | ICD-10-CM | POA: Diagnosis present

## 2015-10-29 DIAGNOSIS — Z7902 Long term (current) use of antithrombotics/antiplatelets: Secondary | ICD-10-CM

## 2015-10-29 DIAGNOSIS — I5042 Chronic combined systolic (congestive) and diastolic (congestive) heart failure: Secondary | ICD-10-CM | POA: Diagnosis present

## 2015-10-29 DIAGNOSIS — Z952 Presence of prosthetic heart valve: Secondary | ICD-10-CM | POA: Diagnosis not present

## 2015-10-29 DIAGNOSIS — Z951 Presence of aortocoronary bypass graft: Secondary | ICD-10-CM | POA: Diagnosis not present

## 2015-10-29 DIAGNOSIS — I5022 Chronic systolic (congestive) heart failure: Secondary | ICD-10-CM | POA: Diagnosis not present

## 2015-10-29 DIAGNOSIS — I509 Heart failure, unspecified: Secondary | ICD-10-CM | POA: Diagnosis not present

## 2015-10-29 DIAGNOSIS — Z9861 Coronary angioplasty status: Secondary | ICD-10-CM

## 2015-10-29 DIAGNOSIS — I2510 Atherosclerotic heart disease of native coronary artery without angina pectoris: Secondary | ICD-10-CM | POA: Diagnosis present

## 2015-10-29 DIAGNOSIS — Z87891 Personal history of nicotine dependence: Secondary | ICD-10-CM | POA: Diagnosis not present

## 2015-10-29 DIAGNOSIS — Z95810 Presence of automatic (implantable) cardiac defibrillator: Secondary | ICD-10-CM | POA: Diagnosis not present

## 2015-10-29 DIAGNOSIS — Z7951 Long term (current) use of inhaled steroids: Secondary | ICD-10-CM

## 2015-10-29 DIAGNOSIS — I130 Hypertensive heart and chronic kidney disease with heart failure and stage 1 through stage 4 chronic kidney disease, or unspecified chronic kidney disease: Principal | ICD-10-CM | POA: Diagnosis present

## 2015-10-29 DIAGNOSIS — I872 Venous insufficiency (chronic) (peripheral): Secondary | ICD-10-CM | POA: Diagnosis present

## 2015-10-29 DIAGNOSIS — E785 Hyperlipidemia, unspecified: Secondary | ICD-10-CM | POA: Diagnosis present

## 2015-10-29 DIAGNOSIS — I25118 Atherosclerotic heart disease of native coronary artery with other forms of angina pectoris: Secondary | ICD-10-CM | POA: Diagnosis present

## 2015-10-29 DIAGNOSIS — N179 Acute kidney failure, unspecified: Secondary | ICD-10-CM | POA: Diagnosis not present

## 2015-10-29 DIAGNOSIS — Z7982 Long term (current) use of aspirin: Secondary | ICD-10-CM

## 2015-10-29 DIAGNOSIS — Z953 Presence of xenogenic heart valve: Secondary | ICD-10-CM

## 2015-10-29 DIAGNOSIS — N189 Chronic kidney disease, unspecified: Secondary | ICD-10-CM

## 2015-10-29 HISTORY — DX: Chronic systolic (congestive) heart failure: I50.22

## 2015-10-29 HISTORY — PX: CARDIAC CATHETERIZATION: SHX172

## 2015-10-29 LAB — CBC
HEMATOCRIT: 35.4 % — AB (ref 39.0–52.0)
HEMATOCRIT: 35.4 % — AB (ref 39.0–52.0)
HEMOGLOBIN: 11.6 g/dL — AB (ref 13.0–17.0)
Hemoglobin: 11.5 g/dL — ABNORMAL LOW (ref 13.0–17.0)
MCH: 29.4 pg (ref 26.0–34.0)
MCH: 29.2 pg (ref 26.0–34.0)
MCHC: 32.8 g/dL (ref 30.0–36.0)
MCHC: 32.5 g/dL (ref 30.0–36.0)
MCV: 89.8 fL (ref 78.0–100.0)
MCV: 89.8 fL (ref 78.0–100.0)
Platelets: 207 10*3/uL (ref 150–400)
Platelets: 203 10*3/uL (ref 150–400)
RBC: 3.94 MIL/uL — ABNORMAL LOW (ref 4.22–5.81)
RBC: 3.94 MIL/uL — AB (ref 4.22–5.81)
RDW: 14.4 % (ref 11.5–15.5)
RDW: 14.5 % (ref 11.5–15.5)
WBC: 7.6 10*3/uL (ref 4.0–10.5)
WBC: 6.1 10*3/uL (ref 4.0–10.5)

## 2015-10-29 LAB — POCT I-STAT 3, VENOUS BLOOD GAS (G3P V)
ACID-BASE EXCESS: 2 mmol/L (ref 0.0–2.0)
Acid-Base Excess: 3 mmol/L — ABNORMAL HIGH (ref 0.0–2.0)
Bicarbonate: 27.7 mEq/L — ABNORMAL HIGH (ref 20.0–24.0)
Bicarbonate: 28.1 mEq/L — ABNORMAL HIGH (ref 20.0–24.0)
O2 Saturation: 59 %
O2 Saturation: 59 %
PCO2 VEN: 46 mmHg (ref 45.0–50.0)
PH VEN: 7.394 — AB (ref 7.250–7.300)
PO2 VEN: 31 mmHg (ref 31.0–45.0)
TCO2: 29 mmol/L (ref 0–100)
TCO2: 29 mmol/L (ref 0–100)
pCO2, Ven: 45.8 mmHg (ref 45.0–50.0)
pH, Ven: 7.389 — ABNORMAL HIGH (ref 7.250–7.300)
pO2, Ven: 31 mmHg (ref 31.0–45.0)

## 2015-10-29 LAB — BLOOD GAS, VENOUS

## 2015-10-29 LAB — COMPREHENSIVE METABOLIC PANEL
ALBUMIN: 3.2 g/dL — AB (ref 3.5–5.0)
ALT: 18 U/L (ref 17–63)
AST: 21 U/L (ref 15–41)
Alkaline Phosphatase: 85 U/L (ref 38–126)
Anion gap: 12 (ref 5–15)
BILIRUBIN TOTAL: 0.3 mg/dL (ref 0.3–1.2)
BUN: 48 mg/dL — AB (ref 6–20)
CO2: 28 mmol/L (ref 22–32)
CREATININE: 3.3 mg/dL — AB (ref 0.61–1.24)
Calcium: 9 mg/dL (ref 8.9–10.3)
Chloride: 100 mmol/L — ABNORMAL LOW (ref 101–111)
GFR calc Af Amer: 21 mL/min — ABNORMAL LOW (ref >60)
GFR, EST NON AFRICAN AMERICAN: 18 mL/min — AB (ref >60)
GLUCOSE: 154 mg/dL — AB (ref 65–99)
Potassium: 3.3 mmol/L — ABNORMAL LOW (ref 3.5–5.1)
Sodium: 140 mmol/L (ref 135–145)
TOTAL PROTEIN: 6.7 g/dL (ref 6.5–8.1)

## 2015-10-29 LAB — I-STAT VENOUS BLOOD GAS, ED
ACID-BASE EXCESS: 3 mmol/L — AB (ref 0.0–2.0)
Bicarbonate: 27.4 mEq/L — ABNORMAL HIGH (ref 20.0–24.0)
O2 Saturation: 99 %
PH VEN: 7.456 — AB (ref 7.250–7.300)
TCO2: 29 mmol/L (ref 0–100)
pCO2, Ven: 38.9 mmHg — ABNORMAL LOW (ref 45.0–50.0)
pO2, Ven: 117 mmHg — ABNORMAL HIGH (ref 31.0–45.0)

## 2015-10-29 LAB — CREATININE, SERUM
Creatinine, Ser: 3.16 mg/dL — ABNORMAL HIGH (ref 0.61–1.24)
GFR calc Af Amer: 22 mL/min — ABNORMAL LOW (ref >60)
GFR calc non Af Amer: 19 mL/min — ABNORMAL LOW (ref >60)

## 2015-10-29 LAB — TROPONIN I: Troponin I: 0.03 ng/mL (ref <0.031)

## 2015-10-29 LAB — BRAIN NATRIURETIC PEPTIDE: B NATRIURETIC PEPTIDE 5: 124.6 pg/mL — AB (ref 0.0–100.0)

## 2015-10-29 LAB — I-STAT TROPONIN, ED: Troponin i, poc: 0.02 ng/mL (ref 0.00–0.08)

## 2015-10-29 LAB — GLUCOSE, CAPILLARY: GLUCOSE-CAPILLARY: 155 mg/dL — AB (ref 65–99)

## 2015-10-29 SURGERY — RIGHT HEART CATH

## 2015-10-29 MED ORDER — ALBUTEROL SULFATE (2.5 MG/3ML) 0.083% IN NEBU
2.5000 mg | INHALATION_SOLUTION | Freq: Four times a day (QID) | RESPIRATORY_TRACT | Status: DC | PRN
  Filled 2015-10-29: qty 3

## 2015-10-29 MED ORDER — SODIUM CHLORIDE 0.9 % IV SOLN: 250.0000 mL | INTRAVENOUS | Status: DC | PRN

## 2015-10-29 MED ORDER — SODIUM CHLORIDE 0.9% FLUSH
3.0000 mL | Freq: Two times a day (BID) | INTRAVENOUS | Status: DC
  Administered 2015-10-30 (×2): 3 mL via INTRAVENOUS

## 2015-10-29 MED ORDER — SODIUM CHLORIDE 0.9% FLUSH
3.0000 mL | Freq: Two times a day (BID) | INTRAVENOUS | Status: DC
  Administered 2015-10-29: 3 mL via INTRAVENOUS

## 2015-10-29 MED ORDER — LIDOCAINE HCL (PF) 1 % IJ SOLN
INTRAMUSCULAR | Status: AC
  Filled 2015-10-29: qty 30

## 2015-10-29 MED ORDER — FLUTICASONE PROPIONATE 50 MCG/ACT NA SUSP
1.0000 | Freq: Every day | NASAL | Status: DC
  Administered 2015-10-30 – 2015-10-31 (×2): 1 via NASAL
  Filled 2015-10-29: qty 16

## 2015-10-29 MED ORDER — TOPIRAMATE 100 MG PO TABS
50.0000 mg | ORAL_TABLET | Freq: Two times a day (BID) | ORAL | Status: DC
  Administered 2015-10-29 – 2015-10-31 (×4): 50 mg via ORAL
  Filled 2015-10-29 (×4): qty 1

## 2015-10-29 MED ORDER — HEPARIN (PORCINE) IN NACL 2-0.9 UNIT/ML-% IJ SOLN
INTRAMUSCULAR | Status: AC
  Filled 2015-10-29: qty 500

## 2015-10-29 MED ORDER — ACETAMINOPHEN 325 MG PO TABS
650.0000 mg | ORAL_TABLET | ORAL | Status: DC | PRN
  Administered 2015-10-29: 650 mg via ORAL
  Filled 2015-10-29: qty 2

## 2015-10-29 MED ORDER — VITAMIN D 1000 UNITS PO TABS
2000.0000 [IU] | ORAL_TABLET | Freq: Every day | ORAL | Status: DC
Start: 2015-10-30 — End: 2015-10-31
  Administered 2015-10-30 – 2015-10-31 (×2): 2000 [IU] via ORAL
  Filled 2015-10-29 (×3): qty 2

## 2015-10-29 MED ORDER — ENOXAPARIN SODIUM 40 MG/0.4ML ~~LOC~~ SOLN
40.0000 mg | SUBCUTANEOUS | Status: DC
  Administered 2015-10-30: 40 mg via SUBCUTANEOUS
  Filled 2015-10-29: qty 0.4

## 2015-10-29 MED ORDER — ISOSORBIDE MONONITRATE ER 60 MG PO TB24
60.0000 mg | ORAL_TABLET | Freq: Every day | ORAL | Status: DC
Start: 2015-10-30 — End: 2015-10-31
  Administered 2015-10-30 – 2015-10-31 (×2): 60 mg via ORAL
  Filled 2015-10-29 (×2): qty 1

## 2015-10-29 MED ORDER — HYDROCODONE-ACETAMINOPHEN 5-325 MG PO TABS
1.0000 | ORAL_TABLET | Freq: Four times a day (QID) | ORAL | Status: DC | PRN
  Administered 2015-10-30: 1 via ORAL
  Filled 2015-10-29: qty 1

## 2015-10-29 MED ORDER — FUROSEMIDE 10 MG/ML IJ SOLN
40.0000 mg | Freq: Once | INTRAMUSCULAR | Status: AC
  Administered 2015-10-29: 40 mg via INTRAVENOUS
  Filled 2015-10-29: qty 4

## 2015-10-29 MED ORDER — SODIUM CHLORIDE 0.9% FLUSH: 3.0000 mL | INTRAVENOUS | Status: DC | PRN

## 2015-10-29 MED ORDER — HEPARIN SODIUM (PORCINE) 5000 UNIT/ML IJ SOLN: 5000.0000 [IU] | Freq: Three times a day (TID) | INTRAMUSCULAR | Status: DC

## 2015-10-29 MED ORDER — POTASSIUM CHLORIDE CRYS ER 20 MEQ PO TBCR
20.0000 meq | EXTENDED_RELEASE_TABLET | Freq: Once | ORAL | Status: AC
Start: 2015-10-29 — End: 2015-10-29
  Administered 2015-10-29: 20 meq via ORAL
  Filled 2015-10-29: qty 1

## 2015-10-29 MED ORDER — ONDANSETRON HCL 4 MG/2ML IJ SOLN: 4.0000 mg | Freq: Four times a day (QID) | INTRAMUSCULAR | Status: DC | PRN

## 2015-10-29 MED ORDER — AMIODARONE HCL 200 MG PO TABS
200.0000 mg | ORAL_TABLET | Freq: Every day | ORAL | Status: DC
  Administered 2015-10-30 – 2015-10-31 (×2): 200 mg via ORAL
  Filled 2015-10-29 (×2): qty 1

## 2015-10-29 MED ORDER — NITROGLYCERIN 0.4 MG SL SUBL: 0.4000 mg | SUBLINGUAL_TABLET | SUBLINGUAL | Status: DC | PRN

## 2015-10-29 MED ORDER — DULOXETINE HCL 60 MG PO CPEP
60.0000 mg | ORAL_CAPSULE | Freq: Two times a day (BID) | ORAL | Status: DC
  Administered 2015-10-29 – 2015-10-31 (×4): 60 mg via ORAL
  Filled 2015-10-29 (×4): qty 1

## 2015-10-29 MED ORDER — SODIUM CHLORIDE 0.9% FLUSH: 3.0000 mL | Freq: Two times a day (BID) | INTRAVENOUS | Status: DC

## 2015-10-29 MED ORDER — SODIUM CHLORIDE 0.9% FLUSH
3.0000 mL | INTRAVENOUS | Status: DC | PRN
Start: 2015-10-29 — End: 2015-10-31

## 2015-10-29 MED ORDER — ACETAMINOPHEN 325 MG PO TABS: 650.0000 mg | ORAL_TABLET | ORAL | Status: DC | PRN

## 2015-10-29 MED ORDER — ASPIRIN 81 MG PO CHEW
81.0000 mg | CHEWABLE_TABLET | Freq: Every day | ORAL | Status: DC
  Administered 2015-10-30 – 2015-10-31 (×2): 81 mg via ORAL
  Filled 2015-10-29 (×2): qty 1

## 2015-10-29 MED ORDER — LIDOCAINE HCL (PF) 1 % IJ SOLN
INTRAMUSCULAR | Status: DC | PRN
  Administered 2015-10-29: 2 mL via INTRADERMAL

## 2015-10-29 MED ORDER — HEPARIN (PORCINE) IN NACL 2-0.9 UNIT/ML-% IJ SOLN
INTRAMUSCULAR | Status: DC | PRN
  Administered 2015-10-29: 500 mL

## 2015-10-29 MED ORDER — GABAPENTIN 100 MG PO CAPS
100.0000 mg | ORAL_CAPSULE | Freq: Three times a day (TID) | ORAL | Status: DC
  Administered 2015-10-29 – 2015-10-31 (×5): 100 mg via ORAL
  Filled 2015-10-29 (×5): qty 1

## 2015-10-29 MED ORDER — POTASSIUM CHLORIDE CRYS ER 20 MEQ PO TBCR
20.0000 meq | EXTENDED_RELEASE_TABLET | Freq: Two times a day (BID) | ORAL | Status: DC
  Administered 2015-10-29 – 2015-10-31 (×4): 20 meq via ORAL
  Filled 2015-10-29 (×3): qty 1

## 2015-10-29 MED ORDER — INSULIN ASPART 100 UNIT/ML ~~LOC~~ SOLN: 0.0000 [IU] | Freq: Three times a day (TID) | SUBCUTANEOUS | Status: DC

## 2015-10-29 MED ORDER — GLIPIZIDE 2.5 MG HALF TABLET
2.5000 mg | ORAL_TABLET | Freq: Every day | ORAL | Status: DC
  Administered 2015-10-30 – 2015-10-31 (×2): 2.5 mg via ORAL
  Filled 2015-10-29 (×4): qty 1

## 2015-10-29 MED ORDER — METOPROLOL SUCCINATE ER 50 MG PO TB24
50.0000 mg | ORAL_TABLET | Freq: Every day | ORAL | Status: DC
  Administered 2015-10-30 – 2015-10-31 (×2): 50 mg via ORAL
  Filled 2015-10-29 (×2): qty 1

## 2015-10-29 MED ORDER — CLOPIDOGREL BISULFATE 75 MG PO TABS
75.0000 mg | ORAL_TABLET | Freq: Every day | ORAL | Status: DC
  Administered 2015-10-30 – 2015-10-31 (×2): 75 mg via ORAL
  Filled 2015-10-29 (×2): qty 1

## 2015-10-29 MED ORDER — SODIUM CHLORIDE 0.9 % IV SOLN
250.0000 mL | INTRAVENOUS | Status: DC | PRN
Start: 2015-10-29 — End: 2015-10-31

## 2015-10-29 MED ORDER — ATORVASTATIN CALCIUM 20 MG PO TABS
20.0000 mg | ORAL_TABLET | Freq: Every day | ORAL | Status: DC
  Administered 2015-10-30 – 2015-10-31 (×2): 20 mg via ORAL
  Filled 2015-10-29 (×2): qty 1

## 2015-10-29 SURGICAL SUPPLY — 7 items
CATH SWAN GANZ VIP 7.5F (CATHETERS) ×3 IMPLANT
PACK CARDIAC CATHETERIZATION (CUSTOM PROCEDURE TRAY) ×3 IMPLANT
SHEATH PINNACLE 8F 10CM (SHEATH) ×3 IMPLANT
SLEEVE REPOSITIONING LENGTH 30 (MISCELLANEOUS) ×3 IMPLANT
TRANSDUCER W/STOPCOCK (MISCELLANEOUS) ×3 IMPLANT
TUBING ART PRESS 72  MALE/FEM (TUBING) ×2
TUBING ART PRESS 72 MALE/FEM (TUBING) ×1 IMPLANT

## 2015-10-29 NOTE — Telephone Encounter (Signed)
Spoke with patient and his weight is up from 262 yesterday to 267 He did have 2 Torsemide yesterday am but none in the afternoon or this am secondary to recommendations of Dr Sallyanne Kuster  Dr Sallyanne Kuster reviewed labs, BUN 73 Cr 3.68 so he was going to hold Torsemide for 24 hours  He has had headache and dizziness for about a month  Does have some increased shortness of breath, swelling in ankles, feet, and lower abdomen Discussed with Dr Debara Pickett DOD and recommendation is to go to [facility] ED Advised patient and wife, verbalized understanding  Spoke with Wenda Overland PA and Janett Billow in ED triage

## 2015-10-29 NOTE — Progress Notes (Signed)
Pt transferred from Cath Lab. Pt brought to the floor in stable condition. Vitals taken. All immediate pertinent needs to patient addressed. Patient Guide given to patient. Important safety instructions relating to hospitalization reviewed with patient. Patient verbalized understanding. Will continue to monitor pt.

## 2015-10-29 NOTE — H&P (Signed)
History & Physical    Patient ID: Dominic [name] MRN: [id number], DOB/AGE: 1951/03/31   Admit date: 10/29/2015   Primary Physician: Pcp Not In System Primary Cardiologist: Dr. Sallyanne Kuster  Patient Profile    65 yo male with PMH of CAD (CABG 2015 PCI-BMS ostial LCX Sept 2015 04/05/2015 cath 50% distal LM, 90% prox LAD, old stent prox LAD 70% ISR, patent LIMA-LAD, 80% prox RCA PCI-DES x2 RCA and LCX)/Biventricular ICD/Mitral valve replacement (1/17) /CKD IV/HTN/DM II and post op atrial fibrillation who presented to the [facility] ED with SOB and weight gain.    Past Medical History    Past Medical History  Diagnosis Date  . History of mitral valve replacement with bioprosthetic valve 09/05/2015    31 mm Edwards 2015, Dr. Jerelene Redden  . Acute on chronic combined systolic (congestive) and diastolic (congestive) heart failure ([place]) 09/05/2015  . CAD (coronary artery disease) 09/05/2015    CABG 2015 PCI-BMS ostial LCX Sept 2015 04/05/2015 cath 50% distal LM, 90% prox LAD, old stent prox LAD 70% ISR, patent LIMA-LAD, 80% prox RCA PCI-DES x2 RCA and LCX   . Biventricular ICD (implantable cardioverter-defibrillator) - [facility] 09/05/2015    Jul 01, 2015 [facility] Dynagen X4  . CKD stage 4 due to type 2 diabetes mellitus ([place]) 09/05/2015  . Essential hypertension 09/05/2015  . Diabetes mellitus, type 2 ([place]) 09/05/2015  . Postoperative atrial fibrillation ([place]) 09/05/2015    Past Surgical History  Procedure Laterality Date  . Coronary angioplasty    . Cardiac valve replacement  2015    65mm Edwards bioprosthesis  . Coronary artery bypass graft  2015    [facility], Dr. Jerelene Redden.     Allergies  Allergies  Allergen Reactions  . Hydrochlorothiazide Itching and Other (See Comments)    Unknown. Hyponatremia  . Amoxicillin-Pot Clavulanate Nausea And Vomiting, Other (See Comments) and Nausea Only    unknown Doesn't know  . Clindamycin Nausea And Vomiting, Nausea Only and Other (See Comments)    Chest pain  .  Lisinopril Other (See Comments) and Cough    Unknown. Unknown. hyponatremia Unknown. Unknown.  . Meloxicam Other (See Comments), Itching and Nausea Only    unknown Unknown.  . Topiramate Other (See Comments)  . Tamsulosin Other (See Comments)    dysuria    History of Present Illness    65 yo male patient of Dr. Victorino December with PMH of CAD (CABG 2015 PCI-BMS ostial LCX Sept 2015 04/05/2015 cath 50% distal LM, 90% prox LAD, old stent prox LAD 70% ISR, patent LIMA-LAD, 80% prox RCA PCI-DES x2 RCA and LCX)/Biventricular ICD/Mitral valve replacement (1/17) /CKD IV/HTN/DM II and post op atrial fibrillation. He was last seen in the office on 10/07/2015 where during this appointment he was complaining of dyspnea, edema and weight gain. At this office visit the scale showed 273 pounds. Plans to give metolazone 2.5mg  in the morning before his torsemide, until his weight decreased by 9 pounds. During a follow-up phone call he reported he was taking his medications appropriately, and his weight was down to 258 pounds.  Patient's wife called the office yesterday reporting that his weight was up from 262-267 this morning. He reported that he did take 2 torsemide yesterday, but none this morning secondary to his elevated creatinine of 3.68. Patient reported having increased shortness of breath, swelling in his ankles, feet and lower abdomen. He was again advised over the phone to come to [facility] ED for further evaluation.  In talking with patient he  reports that symptoms have been waxing and waning since his most recent cardiac intervention back in October. States that sometimes his weight is up, other times it's controlled. Reports that he gets frustrated often, given the fluctuations in his weight pattern, even though he is attempting to stick to his medication regimen. States last night with shortness of breath, and lower extremity edema became worse. Also reports that he is developed intermittent centralized  chest pain that is sharp in nature, seems to exist with rest and exertion. Does report orthopnea, along with PND, saying that he uses at least 2-3 pillows at night to sleep. Reports a worsening renal function over the past couple of months, and has seen nephrology states in the past but with no plans for dialysis yet.   In the ED he was noted to have a creatinine of 3.3, which is slightly improved from previous reading of 3.681 week ago. Hemoglobin is stable, point of care troponin is negative, BNP slightly elevated at 124.6. chest x-ray was negative for edema/infection. EKG is relatively unchanged showing AV paced rhythm with a rate of 64. He was given one dose of IV Lasix 40 mg, with no urine output yet. Blood pressure is soft at 105/60.   Cardiology has been called for admission related to the patient's CHF.     Home Medications    Prior to Admission medications   Medication Sig Start Date End Date Taking? Authorizing Provider  albuterol (PROVENTIL HFA;VENTOLIN HFA) 108 (90 Base) MCG/ACT inhaler Inhale 2 puffs into the lungs every 6 (six) hours as needed for wheezing or shortness of breath.    Historical Provider, MD  amiodarone (PACERONE) 200 MG tablet Take 200 mg by mouth daily.    Historical Provider, MD  aspirin 81 MG tablet Take 81 mg by mouth daily.    Historical Provider, MD  atorvastatin (LIPITOR) 20 MG tablet Take 20 mg by mouth daily.    Historical Provider, MD  butalbital-acetaminophen-caffeine (FIORICET WITH CODEINE) 50-325-40-30 MG capsule Take 1 capsule by mouth every 4 (four) hours as needed for headache.    Historical Provider, MD  Cholecalciferol (VITAMIN D3) 2000 units TABS Take 1 tablet by mouth daily.    Historical Provider, MD  clopidogrel (PLAVIX) 75 MG tablet Take 75 mg by mouth daily.    Historical Provider, MD  DULoxetine (CYMBALTA) 60 MG capsule Take 120 mg by mouth daily.    Historical Provider, MD  fluticasone (FLONASE) 50 MCG/ACT nasal spray Place 1 spray into both  nostrils daily.    Historical Provider, MD  gabapentin (NEURONTIN) 100 MG capsule Take 100 mg by mouth 3 (three) times daily.    Historical Provider, MD  glucose blood test strip 1 each by Other route as needed for other. Use as instructed    Historical Provider, MD  HYDROcodone-acetaminophen (NORCO/VICODIN) 5-325 MG tablet Take 1 tablet by mouth every 6 (six) hours as needed for moderate pain.    Historical Provider, MD  metFORMIN (GLUCOPHAGE) 1000 MG tablet Take 1,000 mg by mouth 2 (two) times daily with a meal.    Historical Provider, MD  metolazone (ZAROXOLYN) 2.5 MG tablet Take 1 tablet (2.5 mg total) by mouth daily. 10/07/15   Jihaad Bruschi, MD  metoprolol succinate (TOPROL-XL) 25 MG 24 hr tablet Take 25 mg by mouth daily.    Historical Provider, MD  nitroGLYCERIN (NITROSTAT) 0.4 MG SL tablet Place 0.4 mg under the tongue every 5 (five) minutes as needed for chest pain.    Historical  Provider, MD  topiramate (TOPAMAX) 25 MG tablet Take 50 mg by mouth at bedtime.    Historical Provider, MD  torsemide (DEMADEX) 20 MG tablet Take 3 tablets (60 mg total) by mouth 2 (two) times daily. 09/03/15   [name], MD    Family History    Family History  Problem Relation Age of Onset  . Hypertension Father   . Hyperlipidemia Father     Social History    Social History   Social History  . Marital Status: Married    Spouse Name: N/A  . Number of Children: N/A  . Years of Education: N/A   Occupational History  . Not on file.   Social History Main Topics  . Smoking status: Current Some Day Smoker  . Smokeless tobacco: Never Used  . Alcohol Use: No  . Drug Use: No  . Sexual Activity: Not on file   Other Topics Concern  . Not on file   Social History Narrative     Review of Systems    General:  No chills, fever, night sweats or weight changes.  Cardiovascular:  + chest pain, + dyspnea on exertion, + edema, + orthopnea, palpitations, paroxysmal nocturnal  dyspnea. Dermatological: No rash, lesions/masses Respiratory: No cough, dyspnea Urologic: No hematuria, dysuria Abdominal:   No nausea, vomiting, diarrhea, bright red blood per rectum, melena, or hematemesis Neurologic:  No visual changes, wkns, changes in mental status. +light-headedness with change in position All other systems reviewed and are otherwise negative except as noted above.  Physical Exam    Blood pressure 105/60, pulse 63, temperature 97.7 F (36.5 C), temperature source Oral, resp. rate 14, height 6\' 1"  (1.854 m), weight 274 lb 5 oz (124.427 kg), SpO2 94 %.  General: Pleasant older male, NAD Psych: Normal affect. Neuro: Alert and oriented X 3. Moves all extremities spontaneously. HEENT: Normal  Neck: Supple without bruits + JVD Lungs:  Resp regular but slightly labored, diminished throughout, mild crackles in LL fields. Heart: RRR no s3, s4, or murmurs. Abdomen: Soft, non-tender, non-distended, BS + x 4.  Extremities: No clubbing, cyanosis or 2+edema L>R. DP/PT/Radials 2+ and equal bilaterally.  Labs    Troponin [facility] Test)  Recent Labs  10/29/15 1127  TROPIPOC 0.02   No results for input(s): CKTOTAL, CKMB, TROPONINI in the last 72 hours. Lab Results  Component Value Date   WBC 7.6 10/29/2015   HGB 11.6* 10/29/2015   HCT 35.4* 10/29/2015   MCV 89.8 10/29/2015   PLT 207 10/29/2015     Recent Labs Lab 10/29/15 1113  NA 140  K 3.3*  CL 100*  CO2 28  BUN 48*  CREATININE 3.30*  CALCIUM 9.0  PROT 6.7  BILITOT 0.3  ALKPHOS 85  ALT 18  AST 21  GLUCOSE 154*   Lab Results  Component Value Date   CHOL 185 10/21/2015   HDL 34* 10/21/2015   LDLCALC 104 10/21/2015   TRIG 237* 10/21/2015     Radiology Studies    Dg Chest 2 View  10/29/2015  CLINICAL DATA:  Shortness of breath. EXAM: CHEST  2 VIEW COMPARISON:  None. FINDINGS: There is a left biventricular ICD. Heart size is within normal limits. Median sternotomy wires are present.  Evidence for prior mitral valve surgery. Lungs are clear without pulmonary edema or focal airspace disease. Degenerative changes in the thoracic spine. No large pleural effusions. IMPRESSION: No active cardiopulmonary disease. Electronically Signed   By: Scherrie Gerlach.D.  On: 10/29/2015 12:12    ECG & Cardiac Imaging    EKG: AV paced, rate 64  Echo: 06/28/2015  INTERPRETATION --------------------------------------------------------------- SEVERE LV DYSFUNCTION-30% WITH MODERATE LVH NORMAL RIGHT VENTRICULAR SYSTOLIC FUNCTION VALVULAR REGURGITATION: MODERATE AR, TRIVIAL MR, MILD TR VALVULAR STENOSIS: MILD AS PROSTHETIC VALVE(S): BIOPROSTHETIC MV Limited views of RV; function appears grossly normal Compared with prior Echo study on 04/05/2015: NO SIGNIFICANT CHANGE    Assessment & Plan    1. CHF: Patient reports intermittent symptoms of CHF including dyspnea, lower extremity edema, and weight gain since October 2016. Has been seen a few times in the office for this with attempts to control in the outpatient setting. Most recently reports yesterday afternoon feeling increasingly dyspneic along with noted weight gain in the past 2 days of 5 pounds. It has been difficult to dose diuretic given his history of CKD an elevated creatinine. He presented to the ED earlier this morning, has been given one dose 40 mg Lasix IV, with 350 UOP. Does appear to have some volume overload on exam, with mild crackles in the lower lung fields diminished throughout, 2+ lower extremity edema with the left being greater than the right and JVD, but his chest x-ray is clear. --Reports a dry weight around 260Lbs, with current weight at 274Lbs --On Zaroxolyn 2.5 mg daily, along with Demadex 60 mg twice a day at home.  --May need to consult nephrology given patient's history of CKD, and difficulty dosing diuretics. He does report seeing a nephrologist in the past but no current plans for dialysis at this time. Will consult  and hold diuresis, and follow recommendations.  --Involve HF? --On Imdur and BB  2. CKD IV: Cr currently 3.30 which is down from 3.68 a week ago. --Will hold home metformin, start SSI --No room for ACE/ARB  --K + 3.3, home dose of potassium is 20 mEq twice a day --Replace with 33meq now, and continue with home dosing  3. CAD: Reports intermittent centralized chest pressure that is sharp in nature over the past couple of weeks. This seems to come with rest and activity.  --He does have significant residual CAD, but would not consider cath candidate at this time given his Cr level --POC trop was negative --Cycle troponins 3 --Would start on heparin drip if elevation occurs. --Continue ASA and plavix   4. DM II: --Hold home metformin, start SSI  5. Hx of Atrial Fib: This has been mentioned in previous notes, but unclear about when this was first noted. At this time not currently on anticoagulation therapy. Noted to be an AV paced rhythm on past EKGs.  --Continue aspirin --On Amiodarone   6. S/p MV replacement 06/2015   7. Biventricular PM:  --interrogation PM back in April at the office showed >>normal function. Estimated generator longevity is 8 years. Last charge time was 9 seconds. All lead parameters appear to be excellent. The left ventricular lead pacing configuration is LV ring4-can. His heart rate variability footprint does show improvement compared with January. There is 100% by V pacing. There is 25% atrial pacing. There has been no atrial fibrillation.  8. HLD: --On statin  -Dr. Sallyanne Kuster to see  Signed, Reino Bellis, NP-C Pager [phone] 10/29/2015, 3:27 PM  I have seen and examined the patient along with Reino Bellis, NP-C.  I have reviewed the chart, notes and new data.  I agree with NP's note.  Key new complaints: constant dyspnea, weight gain 5 lb after stopping the diuretic for 1 day  Key examination changes: extremities are warm; JVP hard to see, but  seems elevated 7-9 cm, clear lungs, split S2, no clear S3 Key new findings / data: AsBiV pacing on monitor, CXR shows emphysematous chest with clear lung fields  PLAN: Admit for CHF with cardiorenal sd. I wonder whether there is actually worsening of intrinsic renal function. He needs a Nephrology consultation. His dyspnea may also be explained by emphysema. Need to clarify with a RHC. Involve CHF service.  [name], MD, [place] [phone] 10/29/2015, 4:35 PM

## 2015-10-29 NOTE — Telephone Encounter (Signed)
Mr.Ruybal is calling because he has gained 5lbs of fluid since yesterday evening. He was told to stop taking Torsemide for 24hrs .[name] Please call  Thanks

## 2015-10-29 NOTE — ED Notes (Signed)
Pt sts weight gain and SOB with elevated kidney levels sent from PCP

## 2015-10-29 NOTE — ED Provider Notes (Signed)
CSN: [id number]     Arrival date & time 10/29/15  1048 History   First MD Initiated Contact with Patient 10/29/15 1211     Chief Complaint  Patient presents with  . Abnormal Lab  . Shortness of Breath     (Consider location/radiation/quality/duration/timing/severity/associated sxs/prior Treatment) Patient is a 65 y.o. male presenting with shortness of breath. The history is provided by the patient.  Shortness of Breath Severity:  Moderate Onset quality:  Gradual Duration:  1 week Timing:  Constant Progression:  Worsening Chronicity:  Recurrent Relieved by:  Nothing Worsened by:  Nothing tried (certain positions) Ineffective treatments:  None tried Associated symptoms: no abdominal pain, no chest pain, no fever, no headaches, no rash and no vomiting    65 yo M With a chief complaint of shortness of breath and fluid overload. Going on for about a week. Was seen by his cardiologist about a week ago and had an increasing dose of his diuretic. He then got a call back as his renal function was significant only worse than previous. He is told to stop his diuretic. Patient having worsening leg swelling as well as shortness of breath since then. Went to his family doctor today who suggested he come to the emergency department. Denied any chest pain. Having some dizziness upon walking. Denies any exertional components.  Past Medical History  Diagnosis Date  . History of mitral valve replacement with bioprosthetic valve 09/05/2015    31 mm Edwards 2015, Dr. Jerelene Redden  . Acute on chronic combined systolic (congestive) and diastolic (congestive) heart failure ([place]) 09/05/2015  . CAD (coronary artery disease) 09/05/2015    CABG 2015 PCI-BMS ostial LCX Sept 2015 04/05/2015 cath 50% distal LM, 90% prox LAD, old stent prox LAD 70% ISR, patent LIMA-LAD, 80% prox RCA PCI-DES x2 RCA and LCX   . Biventricular ICD (implantable cardioverter-defibrillator) - [facility] 09/05/2015    Jul 01, 2015 [facility] Dynagen X4  . CKD  stage 4 due to type 2 diabetes mellitus ([place]) 09/05/2015  . Essential hypertension 09/05/2015  . Diabetes mellitus, type 2 ([place]) 09/05/2015  . Postoperative atrial fibrillation ([place]) 09/05/2015   Past Surgical History  Procedure Laterality Date  . Coronary angioplasty    . Cardiac valve replacement  2015    46mm Edwards bioprosthesis  . Coronary artery bypass graft  2015    HPR, Dr. Jerelene Redden.   Family History  Problem Relation Age of Onset  . Hypertension Father   . Hyperlipidemia Father    Social History  Substance Use Topics  . Smoking status: Current Some Day Smoker  . Smokeless tobacco: Never Used  . Alcohol Use: No    Review of Systems  Constitutional: Negative for fever and chills.  HENT: Negative for congestion and facial swelling.   Eyes: Negative for discharge and visual disturbance.  Respiratory: Positive for shortness of breath.   Cardiovascular: Positive for leg swelling. Negative for chest pain and palpitations.  Gastrointestinal: Negative for vomiting, abdominal pain and diarrhea.  Musculoskeletal: Negative for myalgias and arthralgias.  Skin: Negative for color change and rash.  Neurological: Negative for tremors, syncope and headaches.  Psychiatric/Behavioral: Negative for confusion and dysphoric mood.      Allergies  Hydrochlorothiazide; Amoxicillin-pot clavulanate; Clindamycin; Lisinopril; Meloxicam; Topiramate; and Tamsulosin  Home Medications   Prior to Admission medications   Medication Sig Start Date End Date Taking? Authorizing Provider  albuterol (PROVENTIL HFA;VENTOLIN HFA) 108 (90 Base) MCG/ACT inhaler Inhale 2 puffs into the lungs every 6 (six) hours  as needed for wheezing or shortness of breath.   Yes Historical Provider, MD  amiodarone (PACERONE) 200 MG tablet Take 200 mg by mouth daily.   Yes Historical Provider, MD  aspirin 81 MG tablet Take 81 mg by mouth daily.   Yes Historical Provider, MD  atorvastatin (LIPITOR) 20 MG tablet Take 20 mg by  mouth daily.   Yes Historical Provider, MD  butalbital-acetaminophen-caffeine (FIORICET WITH CODEINE) 50-325-40-30 MG capsule Take 1 capsule by mouth every 4 (four) hours as needed for headache.   Yes Historical Provider, MD  cephALEXin (KEFLEX) 500 MG capsule Take 500 mg by mouth 3 (three) times daily.   Yes Historical Provider, MD  Cholecalciferol (VITAMIN D3) 2000 units TABS Take 1 tablet by mouth daily.   Yes Historical Provider, MD  clopidogrel (PLAVIX) 75 MG tablet Take 75 mg by mouth daily.   Yes Historical Provider, MD  DULoxetine (CYMBALTA) 60 MG capsule Take 60 mg by mouth 2 (two) times daily.    Yes Historical Provider, MD  fluticasone (FLONASE) 50 MCG/ACT nasal spray Place 1 spray into both nostrils daily.   Yes Historical Provider, MD  gabapentin (NEURONTIN) 100 MG capsule Take 100 mg by mouth 3 (three) times daily.   Yes Historical Provider, MD  glipiZIDE (GLUCOTROL) 2.5 mg TABS tablet Take 2.5 mg by mouth daily before breakfast.   Yes Historical Provider, MD  glucose blood test strip 1 each by Other route as needed for other. Use as instructed   Yes Historical Provider, MD  HYDROcodone-acetaminophen (NORCO/VICODIN) 5-325 MG tablet Take 1 tablet by mouth every 6 (six) hours as needed for moderate pain.   Yes Historical Provider, MD  isosorbide mononitrate (IMDUR) 60 MG 24 hr tablet Take 60 mg by mouth daily.   Yes Historical Provider, MD  metFORMIN (GLUCOPHAGE) 1000 MG tablet Take 1,000 mg by mouth daily with breakfast.    Yes Historical Provider, MD  metolazone (ZAROXOLYN) 2.5 MG tablet Take 1 tablet (2.5 mg total) by mouth daily. 10/07/15  Yes Mihai Croitoru, MD  metoprolol succinate (TOPROL-XL) 25 MG 24 hr tablet Take 50 mg by mouth daily.    Yes Historical Provider, MD  nitroGLYCERIN (NITROSTAT) 0.4 MG SL tablet Place 0.4 mg under the tongue every 5 (five) minutes as needed for chest pain.   Yes Historical Provider, MD  potassium chloride SA (K-DUR,KLOR-CON) 20 MEQ tablet Take 20 mEq  by mouth 2 (two) times daily.   Yes Historical Provider, MD  topiramate (TOPAMAX) 25 MG tablet Take 50 mg by mouth 2 (two) times daily.    Yes Historical Provider, MD  torsemide (DEMADEX) 20 MG tablet Take 3 tablets (60 mg total) by mouth 2 (two) times daily. 09/03/15  Yes Mihai Croitoru, MD   BP 119/60 mmHg  Pulse 60  Temp(Src) 97.4 F (36.3 C) (Oral)  Resp 16  Ht 6' (1.829 m)  Wt 267 lb 4.8 oz (121.246 kg)  BMI 36.24 kg/m2  SpO2 98% Physical Exam  Constitutional: He is oriented to person, place, and time. He appears well-developed and well-nourished.  HENT:  Head: Normocephalic and atraumatic.  Eyes: EOM are normal. Pupils are equal, round, and reactive to light.  Neck: Normal range of motion. Neck supple. No JVD present.  Cardiovascular: Normal rate and regular rhythm.  Exam reveals no gallop and no friction rub.   No murmur heard. Pulmonary/Chest: No respiratory distress. He has no wheezes.  Abdominal: He exhibits no distension. There is no rebound and no guarding.  Musculoskeletal: Normal  range of motion.  Neurological: He is alert and oriented to person, place, and time.  Skin: No rash noted. No pallor.  Psychiatric: He has a normal mood and affect. His behavior is normal.  Nursing note and vitals reviewed.   ED Course  Procedures (including critical care time) Labs Review Labs Reviewed  CBC - Abnormal; Notable for the following:    RBC 3.94 (*)    Hemoglobin 11.6 (*)    HCT 35.4 (*)    All other components within normal limits  COMPREHENSIVE METABOLIC PANEL - Abnormal; Notable for the following:    Potassium 3.3 (*)    Chloride 100 (*)    Glucose, Bld 154 (*)    BUN 48 (*)    Creatinine, Ser 3.30 (*)    Albumin 3.2 (*)    GFR calc non Af Amer 18 (*)    GFR calc Af Amer 21 (*)    All other components within normal limits  BRAIN NATRIURETIC PEPTIDE - Abnormal; Notable for the following:    B Natriuretic Peptide 124.6 (*)    All other components within normal  limits  CBC - Abnormal; Notable for the following:    RBC 3.94 (*)    Hemoglobin 11.5 (*)    HCT 35.4 (*)    All other components within normal limits  CREATININE, SERUM - Abnormal; Notable for the following:    Creatinine, Ser 3.16 (*)    GFR calc non Af Amer 19 (*)    GFR calc Af Amer 22 (*)    All other components within normal limits  I-STAT VENOUS BLOOD GAS, ED - Abnormal; Notable for the following:    pH, Ven 7.456 (*)    pCO2, Ven 38.9 (*)    pO2, Ven 117.0 (*)    Bicarbonate 27.4 (*)    Acid-Base Excess 3.0 (*)    All other components within normal limits  POCT I-STAT 3, VENOUS BLOOD GAS (G3P V) - Abnormal; Notable for the following:    pH, Ven 7.389 (*)    Bicarbonate 27.7 (*)    All other components within normal limits  POCT I-STAT 3, VENOUS BLOOD GAS (G3P V) - Abnormal; Notable for the following:    pH, Ven 7.394 (*)    Bicarbonate 28.1 (*)    Acid-Base Excess 3.0 (*)    All other components within normal limits  BLOOD GAS, VENOUS  TROPONIN I  BASIC METABOLIC PANEL  TROPONIN I  TROPONIN I  Randolm Idol, ED    Imaging Review Dg Chest 2 View  10/29/2015  CLINICAL DATA:  Shortness of breath. EXAM: CHEST  2 VIEW COMPARISON:  None. FINDINGS: There is a left biventricular ICD. Heart size is within normal limits. Median sternotomy wires are present. Evidence for prior mitral valve surgery. Lungs are clear without pulmonary edema or focal airspace disease. Degenerative changes in the thoracic spine. No large pleural effusions. IMPRESSION: No active cardiopulmonary disease. Electronically Signed   By: Markus Daft M.D.   On: 10/29/2015 12:12   I have personally reviewed and evaluated these images and lab results as part of my medical decision-making.   EKG Interpretation   Date/Time:  Friday Oct 29 2015 11:08:54 EDT Ventricular Rate:  64 PR Interval:  112 QRS Duration: 180 QT Interval:  520 QTC Calculation: 536 R Axis:   57 Text Interpretation:  Atrial-sensed  ventricular-paced rhythm Abnormal ECG  No old tracing to compare Confirmed by Logan Vegh MD, DANIEL [phone]) on  10/29/2015 11:58:40  AM      MDM   Final diagnoses:  Congestive heart failure, unspecified congestive heart failure chronicity, unspecified congestive heart failure type (HCC)  CKD (chronic kidney disease), unspecified stage    65 yo M With a chief complaint of shortness of breath and fluid overload. Patient having slightly improved renal function from a week ago. We'll give 40 of Lasix. Cardiology consult.  Cards admission.  The patients results and plan were reviewed and discussed.   Any x-rays performed were independently reviewed by myself.   Differential diagnosis were considered with the presenting HPI.  Medications  isosorbide mononitrate (IMDUR) 24 hr tablet 60 mg (not administered)  potassium chloride SA (K-DUR,KLOR-CON) CR tablet 20 mEq (not administered)  albuterol (PROVENTIL) (2.5 MG/3ML) 0.083% nebulizer solution 2.5 mg (not administered)  amiodarone (PACERONE) tablet 200 mg (not administered)  aspirin chewable tablet 81 mg (not administered)  atorvastatin (LIPITOR) tablet 20 mg (not administered)  cholecalciferol (VITAMIN D) tablet 2,000 Units (not administered)  clopidogrel (PLAVIX) tablet 75 mg (not administered)  DULoxetine (CYMBALTA) DR capsule 60 mg (not administered)  fluticasone (FLONASE) 50 MCG/ACT nasal spray 1 spray (not administered)  glipiZIDE (GLUCOTROL) tablet 2.5 mg (not administered)  gabapentin (NEURONTIN) capsule 100 mg (not administered)  HYDROcodone-acetaminophen (NORCO/VICODIN) 5-325 MG per tablet 1 tablet (not administered)  metoprolol succinate (TOPROL-XL) 24 hr tablet 50 mg (not administered)  nitroGLYCERIN (NITROSTAT) SL tablet 0.4 mg (not administered)  topiramate (TOPAMAX) tablet 50 mg (not administered)  sodium chloride flush (NS) 0.9 % injection 3 mL (not administered)  sodium chloride flush (NS) 0.9 % injection 3 mL (not  administered)  ondansetron (ZOFRAN) injection 4 mg (not administered)  insulin aspart (novoLOG) injection 0-9 Units (not administered)  sodium chloride flush (NS) 0.9 % injection 3 mL (not administered)  sodium chloride flush (NS) 0.9 % injection 3 mL (not administered)  0.9 %  sodium chloride infusion (not administered)  acetaminophen (TYLENOL) tablet 650 mg (not administered)  enoxaparin (LOVENOX) injection 40 mg (not administered)  sodium chloride flush (NS) 0.9 % injection 3 mL (not administered)  sodium chloride flush (NS) 0.9 % injection 3 mL (not administered)  0.9 %  sodium chloride infusion (not administered)  furosemide (LASIX) injection 40 mg (40 mg Intravenous Given 10/29/15 1243)  potassium chloride SA (K-DUR,KLOR-CON) CR tablet 20 mEq (20 mEq Oral Given 10/29/15 1619)    Filed Vitals:   10/29/15 1706 10/29/15 1711 10/29/15 1715 10/29/15 1755  BP: 128/72 125/70 115/70 119/60  Pulse: 63 64 62 60  Temp:    97.4 F (36.3 C)  TempSrc:    Oral  Resp: 12 8 12 16   Height:    6' (1.829 m)  Weight:    267 lb 4.8 oz (121.246 kg)  SpO2: 98% 98% 97% 98%    Final diagnoses:  Congestive heart failure, unspecified congestive heart failure chronicity, unspecified congestive heart failure type (HCC)  CKD (chronic kidney disease), unspecified stage    Admission/ observation were discussed with the admitting physician, patient and/or family and they are comfortable with the plan.     Deno Etienne, DO 10/29/15 [phone]

## 2015-10-29 NOTE — Interval H&P Note (Signed)
History and Physical Interval Note:  10/29/2015 4:54 PM  Dominic [name]  has presented today for surgery, with the diagnosis of hf  The various methods of treatment have been discussed with the patient and family. After consideration of risks, benefits and other options for treatment, the patient has consented to  Procedure(s): Right Heart Cath (N/A) as a surgical intervention .  The patient's history has been reviewed, patient examined, no change in status, stable for surgery.  I have reviewed the patient's chart and labs.  Questions were answered to the patient's satisfaction.     Abbigael Detlefsen, Quillian Quince

## 2015-10-30 ENCOUNTER — Inpatient Hospital Stay (HOSPITAL_COMMUNITY): Payer: Medicare HMO

## 2015-10-30 ENCOUNTER — Encounter (HOSPITAL_COMMUNITY): Payer: Self-pay | Admitting: General Practice

## 2015-10-30 DIAGNOSIS — I509 Heart failure, unspecified: Secondary | ICD-10-CM

## 2015-10-30 LAB — GLUCOSE, CAPILLARY
GLUCOSE-CAPILLARY: 115 mg/dL — AB (ref 65–99)
Glucose-Capillary: 110 mg/dL — ABNORMAL HIGH (ref 65–99)
Glucose-Capillary: 102 mg/dL — ABNORMAL HIGH (ref 65–99)
Glucose-Capillary: 100 mg/dL — ABNORMAL HIGH (ref 65–99)

## 2015-10-30 LAB — ECHOCARDIOGRAM COMPLETE
Height: 72 in
Weight: 4243.2 oz

## 2015-10-30 LAB — BASIC METABOLIC PANEL
Anion gap: 12 (ref 5–15)
BUN: 41 mg/dL — AB (ref 6–20)
CHLORIDE: 103 mmol/L (ref 101–111)
CO2: 26 mmol/L (ref 22–32)
CREATININE: 2.88 mg/dL — AB (ref 0.61–1.24)
Calcium: 9.1 mg/dL (ref 8.9–10.3)
GFR calc Af Amer: 25 mL/min — ABNORMAL LOW (ref >60)
GFR calc non Af Amer: 22 mL/min — ABNORMAL LOW (ref >60)
GLUCOSE: 112 mg/dL — AB (ref 65–99)
Potassium: 3.9 mmol/L (ref 3.5–5.1)
Sodium: 141 mmol/L (ref 135–145)

## 2015-10-30 LAB — TROPONIN I
Troponin I: <0.03 ng/mL (ref <0.031)
Troponin I: 0.03 ng/mL (ref <0.031)

## 2015-10-30 MED ORDER — ALBUTEROL SULFATE (2.5 MG/3ML) 0.083% IN NEBU
2.5000 mg | INHALATION_SOLUTION | Freq: Four times a day (QID) | RESPIRATORY_TRACT | Status: AC
  Administered 2015-10-30 (×3): 2.5 mg via RESPIRATORY_TRACT
  Filled 2015-10-30 (×3): qty 3

## 2015-10-30 MED ORDER — PERFLUTREN LIPID MICROSPHERE
INTRAVENOUS | Status: AC
  Administered 2015-10-30: 12:00:00
  Filled 2015-10-30: qty 10

## 2015-10-30 MED ORDER — PERFLUTREN LIPID MICROSPHERE
1.0000 mL | INTRAVENOUS | Status: AC | PRN
  Filled 2015-10-30: qty 10

## 2015-10-30 NOTE — Consult Note (Signed)
[facility] Admit Date: 10/29/2015 10/30/2015 Rexene Agent Requesting Physician:  Croitoru  Reason for Consult:  AoCKD HPI:  17M seen at request of Dr. Sallyanne Kuster for the evaluation of acute on chronic renal insufficiency. Patient follows with [facility]. It appears that his baseline serum creatinine is quite labile ranging from 2-4. He was admitted yesterday evening with progressive dyspnea with orthopnea and exertional components, weight gain, and concern for an acute exacerbation of systolic heart failure. He underwent right heart catheterization demonstrating low filling pressures and normal cardiac output more consistent with hypovolemia. He has a history of heavy tobacco use and COPD and is being treated for a primary pulmonary component to his dyspnea. Other pertinent history includes type 2 diabetes, hypertension, history of mitral valve replacement with bioprosthetic valve, status post ICD placement, CAD with history of CABG.  Since admission his diuretics have been held. Creatinine has improved to 2.88 this morning as demonstrated below. Other electrolytes are stable. Home diuretics including metolazone and torsemide. He has received bronchodilators this morning and does not think it has caused improvement. He does not engage very much and his family (wife and son) are providing much of the information and conversation.  Renal [facility] at [facility] in January of this year had a left kidney at 10.4 cm and right 11.8 cm with no structural abnormalities and findings consistent with medical renal disease.   CREAT (mg/dL)  Date Value  10/21/2015 3.68*  09/03/2015 2.73*   CREATININE, SER (mg/dL)  Date Value  10/30/2015 2.88*  10/29/2015 3.16*  10/29/2015 3.30*  ] I/Os: I/O last 3 completed shifts: In: 600 [P.O.:600] Out: 2475 [Urine:2475]   ROS Balance of 12 systems is negative w/ exceptions as above  PMH  Past Medical History  Diagnosis Date  . History of  mitral valve replacement with bioprosthetic valve 09/05/2015    31 mm Edwards 2015, Dr. Jerelene Redden  . Acute on chronic combined systolic (congestive) and diastolic (congestive) heart failure ([place]) 09/05/2015  . CAD (coronary artery disease) 09/05/2015    CABG 2015 PCI-BMS ostial LCX Sept 2015 04/05/2015 cath 50% distal LM, 90% prox LAD, old stent prox LAD 70% ISR, patent LIMA-LAD, 80% prox RCA PCI-DES x2 RCA and LCX   . Biventricular ICD (implantable cardioverter-defibrillator) - [facility] 09/05/2015    Jul 01, 2015 [facility] Dynagen X4  . CKD stage 4 due to type 2 diabetes mellitus ([place]) 09/05/2015  . Essential hypertension 09/05/2015  . Diabetes mellitus, type 2 ([place]) 09/05/2015  . Postoperative atrial fibrillation ([place]) 09/05/2015   [place]  Past Surgical History  Procedure Laterality Date  . Coronary angioplasty    . Cardiac valve replacement  2015    61mm Edwards bioprosthesis  . Coronary artery bypass graft  2015    HPR, Dr. Jerelene Redden.   FH  Family History  Problem Relation Age of Onset  . Hypertension Father   . Hyperlipidemia Father    SH  reports that he has been smoking.  He has never used smokeless tobacco. He reports that he does not drink alcohol or use illicit drugs. Allergies  Allergies  Allergen Reactions  . Hydrochlorothiazide Itching and Other (See Comments)    Unknown. Hyponatremia  . Amoxicillin-Pot Clavulanate Nausea And Vomiting, Other (See Comments) and Nausea Only    unknown Doesn't know  . Clindamycin Nausea And Vomiting, Nausea Only and Other (See Comments)    Chest pain  . Lisinopril Other (See Comments) and Cough    Unknown. Unknown. hyponatremia Unknown. Unknown.  [name]  Meloxicam Other (See Comments), Itching and Nausea Only    unknown Unknown.  . Topiramate Other (See Comments)  . Tamsulosin Other (See Comments)    dysuria   Home medications Prior to Admission medications   Medication Sig Start Date End Date Taking? Authorizing Provider  albuterol (PROVENTIL  HFA;VENTOLIN HFA) 108 (90 Base) MCG/ACT inhaler Inhale 2 puffs into the lungs every 6 (six) hours as needed for wheezing or shortness of breath.   Yes Historical Provider, MD  amiodarone (PACERONE) 200 MG tablet Take 200 mg by mouth daily.   Yes Historical Provider, MD  aspirin 81 MG tablet Take 81 mg by mouth daily.   Yes Historical Provider, MD  atorvastatin (LIPITOR) 20 MG tablet Take 20 mg by mouth daily.   Yes Historical Provider, MD  butalbital-acetaminophen-caffeine (FIORICET WITH CODEINE) 50-325-40-30 MG capsule Take 1 capsule by mouth every 4 (four) hours as needed for headache.   Yes Historical Provider, MD  cephALEXin (KEFLEX) 500 MG capsule Take 500 mg by mouth 3 (three) times daily.   Yes Historical Provider, MD  Cholecalciferol (VITAMIN D3) 2000 units TABS Take 1 tablet by mouth daily.   Yes Historical Provider, MD  clopidogrel (PLAVIX) 75 MG tablet Take 75 mg by mouth daily.   Yes Historical Provider, MD  DULoxetine (CYMBALTA) 60 MG capsule Take 60 mg by mouth 2 (two) times daily.    Yes Historical Provider, MD  fluticasone (FLONASE) 50 MCG/ACT nasal spray Place 1 spray into both nostrils daily.   Yes Historical Provider, MD  gabapentin (NEURONTIN) 100 MG capsule Take 100 mg by mouth 3 (three) times daily.   Yes Historical Provider, MD  glipiZIDE (GLUCOTROL) 2.5 mg TABS tablet Take 2.5 mg by mouth daily before breakfast.   Yes Historical Provider, MD  glucose blood test strip 1 each by Other route as needed for other. Use as instructed   Yes Historical Provider, MD  HYDROcodone-acetaminophen (NORCO/VICODIN) 5-325 MG tablet Take 1 tablet by mouth every 6 (six) hours as needed for moderate pain.   Yes Historical Provider, MD  isosorbide mononitrate (IMDUR) 60 MG 24 hr tablet Take 60 mg by mouth daily.   Yes Historical Provider, MD  metFORMIN (GLUCOPHAGE) 1000 MG tablet Take 1,000 mg by mouth daily with breakfast.    Yes Historical Provider, MD  metolazone (ZAROXOLYN) 2.5 MG tablet Take 1  tablet (2.5 mg total) by mouth daily. 10/07/15  Yes Mihai Croitoru, MD  metoprolol succinate (TOPROL-XL) 25 MG 24 hr tablet Take 50 mg by mouth daily.    Yes Historical Provider, MD  nitroGLYCERIN (NITROSTAT) 0.4 MG SL tablet Place 0.4 mg under the tongue every 5 (five) minutes as needed for chest pain.   Yes Historical Provider, MD  potassium chloride SA (K-DUR,KLOR-CON) 20 MEQ tablet Take 20 mEq by mouth 2 (two) times daily.   Yes Historical Provider, MD  topiramate (TOPAMAX) 25 MG tablet Take 50 mg by mouth 2 (two) times daily.    Yes Historical Provider, MD  torsemide (DEMADEX) 20 MG tablet Take 3 tablets (60 mg total) by mouth 2 (two) times daily. 09/03/15  Yes Mihai Croitoru, MD    Current Medications Scheduled Meds: . albuterol  2.5 mg Nebulization Q6H  . amiodarone  200 mg Oral Daily  . aspirin  81 mg Oral Daily  . atorvastatin  20 mg Oral Daily  . cholecalciferol  2,000 Units Oral Daily  . clopidogrel  75 mg Oral Daily  . DULoxetine  60 mg Oral BID  .  enoxaparin (LOVENOX) injection  40 mg Subcutaneous Q24H  . fluticasone  1 spray Each Nare Daily  . gabapentin  100 mg Oral TID  . glipiZIDE  2.5 mg Oral QAC breakfast  . insulin aspart  0-9 Units Subcutaneous TID WC  . isosorbide mononitrate  60 mg Oral Daily  . metoprolol succinate  50 mg Oral Daily  . potassium chloride SA  20 mEq Oral BID  . sodium chloride flush  3 mL Intravenous Q12H  . sodium chloride flush  3 mL Intravenous Q12H  . sodium chloride flush  3 mL Intravenous Q12H  . topiramate  50 mg Oral BID   Continuous Infusions:  PRN Meds:.sodium chloride, sodium chloride, acetaminophen, albuterol, HYDROcodone-acetaminophen, nitroGLYCERIN, ondansetron (ZOFRAN) IV, sodium chloride flush, sodium chloride flush, sodium chloride flush  CBC  Recent Labs Lab 10/29/15 1113 10/29/15 1752  WBC 7.6 6.1  HGB 11.6* 11.5*  HCT 35.4* 35.4*  MCV 89.8 89.8  PLT 207 [id number]   Basic Metabolic Panel  Recent Labs Lab 10/29/15 1113  10/29/15 1752 10/30/15 0725  NA 140  --  141  K 3.3*  --  3.9  CL 100*  --  103  CO2 28  --  26  GLUCOSE 154*  --  112*  BUN 48*  --  41*  CREATININE 3.30* 3.16* 2.88*  CALCIUM 9.0  --  9.1    Physical Exam  Blood pressure 121/56, pulse 57, temperature 97.9 F (36.6 C), temperature source Oral, resp. rate 16, height 6' (1.829 m), weight 120.294 kg (265 lb 3.2 oz), SpO2 96 %. GEN: chronlically ill appearing ENT: NCAT EYES: EOMI CV: RRR PULM: CTAB, diminished throughout, no crackles ABD: s/ntd SKIN: no rashes/ EXT:No Edema in R leg, 1+ chronic in LLE   Assessment 33M with AoCKD3-4 2/2 overdiuresis in setting of chronic dyspnea, chronic systolic HF, likely COPD  1. AoCKD 2/2 overdiuresis and hypovolemia: improving with held diuretics 2. Chronic systolic HF 3. Suspected COPD 4. Chronic dyspnea 5. CAD s/p CABG 6. DM2 7. HTN 8. Hx/o MVR 9. Past tobacco user  Plan 1. He is improving nicely with held diuretics 2. I don't think any other changes are necessary at the current time 3. He will BL to continue following up with his outpatient nephrologist upon discharge. Will sign off for now.  Please call with any questions or concerns.  Pt will need follow up with nephrology at [facility] MD [phone] pgr 10/30/2015, 10:43 AM

## 2015-10-30 NOTE — Progress Notes (Signed)
  Echocardiogram 2D Echocardiogram has been performed.  Diamond Nickel 10/30/2015, 12:29 PM

## 2015-10-30 NOTE — Progress Notes (Signed)
Patient ID: Dominic [name], male   DOB: March 28, 1951, 65 y.o.   MRN: [id number]     Subjective:    Still SOB this AM  Objective:   Temp:  [97.4 F (36.3 C)-97.9 F (36.6 C)] 97.9 F (36.6 C) (05/20 0646) Pulse Rate:  [57-67] 57 (05/20 0646) Resp:  [8-18] 16 (05/20 0646) BP: (104-135)/(44-73) 121/56 mmHg (05/20 0646) SpO2:  [90 %-99 %] 96 % (05/20 0646) Weight:  [265 lb 3.2 oz (120.294 kg)-274 lb 5 oz (124.427 kg)] 265 lb 3.2 oz (120.294 kg) (05/20 0646) Last BM Date: 10/26/15  Filed Weights   10/29/15 1104 10/29/15 1755 10/30/15 0646  Weight: 274 lb 5 oz (124.427 kg) 267 lb 4.8 oz (121.246 kg) 265 lb 3.2 oz (120.294 kg)    Intake/Output Summary (Last 24 hours) at 10/30/15 0852 Last data filed at 10/30/15 [id number]  Gross per 24 hour  Intake    600 ml  Output   2475 ml  Net  -1875 ml    Telemetry: V-paced Exam:  General: NAD  HEENT: sclera clear, throat clear  Resp: CTAB  Cardiac: RRR, no m/rg, no jvd  GI: abdomen soft, NT, ND  MSK:1+ LLE edema (he reports this is chronic)  Neuro: no focal deficits  Psych: appropriate affect  Lab Results:  Basic Metabolic Panel:  Recent Labs Lab 10/29/15 1113 10/29/15 1752  NA 140  --   K 3.3*  --   CL 100*  --   CO2 28  --   GLUCOSE 154*  --   BUN 48*  --   CREATININE 3.30* 3.16*  CALCIUM 9.0  --     Liver Function Tests:  Recent Labs Lab 10/29/15 1113  AST 21  ALT 18  ALKPHOS 85  BILITOT 0.3  PROT 6.7  ALBUMIN 3.2*    CBC:  Recent Labs Lab 10/29/15 1113 10/29/15 1752  WBC 7.6 6.1  HGB 11.6* 11.5*  HCT 35.4* 35.4*  MCV 89.8 89.8  PLT 207 203    Cardiac Enzymes:  Recent Labs Lab 10/29/15 1752 10/30/15 0002  TROPONINI 0.03 <0.03    BNP: No results for input(s): PROBNP in the last 8760 hours.  Coagulation: No results for input(s): INR in the last 168 hours.  ECG:   Medications:   Scheduled Medications: . amiodarone  200 mg Oral Daily  . aspirin  81 mg Oral Daily  . atorvastatin   20 mg Oral Daily  . cholecalciferol  2,000 Units Oral Daily  . clopidogrel  75 mg Oral Daily  . DULoxetine  60 mg Oral BID  . enoxaparin (LOVENOX) injection  40 mg Subcutaneous Q24H  . fluticasone  1 spray Each Nare Daily  . gabapentin  100 mg Oral TID  . glipiZIDE  2.5 mg Oral QAC breakfast  . insulin aspart  0-9 Units Subcutaneous TID WC  . isosorbide mononitrate  60 mg Oral Daily  . metoprolol succinate  50 mg Oral Daily  . potassium chloride SA  20 mEq Oral BID  . sodium chloride flush  3 mL Intravenous Q12H  . sodium chloride flush  3 mL Intravenous Q12H  . sodium chloride flush  3 mL Intravenous Q12H  . topiramate  50 mg Oral BID     Infusions:     PRN Medications:  sodium chloride, sodium chloride, acetaminophen, albuterol, HYDROcodone-acetaminophen, nitroGLYCERIN, ondansetron (ZOFRAN) IV, sodium chloride flush, sodium chloride flush, sodium chloride flush     Assessment/Plan   1. Chronic Systolic HF - admitted with  SOB, reported weight gain.  - no echo in our system. Echo at [facility] Jan 2017 with LVEF 30%. Repeat echo is pending.  --Reports a dry weight around 260Lbs, with current weight at 274Lbs --On Zaroxolyn 2.5 mg daily, along with Demadex 60 mg twice a day at home.  - 10/29/15 RHC with PCWP 8, CI 2.2. Overall low filling presssures. Mean PA 20. From RA/PCWP ratio there is some question about possible RV dysfunction according to report.  - CXR no acute process, BNP 124.  - continue to hold diuretics, we will f/u repeat echo  2. CAD - CABG 2015 PCI-BMS ostial LCX Sept 2015 04/05/2015 cath 50% distal LM, 90% prox LAD, old stent prox LAD 70% ISR, patent LIMA-LAD, 80% prox RCA PCI-DES x2 RCA and LCX - recent chest pain - troponins are negative, EKG ventricular paced - to me he describes atypical chest pain. Sharp pain midchest worst with deep breathing, lasts only about 1 minute. F/u echo.   3. CKD IV Follow up labs, had uptrend in Cr on admission.   4.  Biventricular pacemaker  5. Bioprosthetic MVR  6. SOB - low filling pressures on recent RHC, does not appear to be related to CHF - 30 year smoking history, he does not recall every having PFTs. Will need as outpatient - will give trial of neb treatments to see if helps with SOB - f/u echo  Carlyle Dolly, M.D.

## 2015-10-31 ENCOUNTER — Encounter (HOSPITAL_COMMUNITY): Payer: Self-pay | Admitting: Physician Assistant

## 2015-10-31 DIAGNOSIS — I872 Venous insufficiency (chronic) (peripheral): Secondary | ICD-10-CM

## 2015-10-31 LAB — BASIC METABOLIC PANEL
Anion gap: 7 (ref 5–15)
BUN: 35 mg/dL — AB (ref 6–20)
CHLORIDE: 108 mmol/L (ref 101–111)
CO2: 25 mmol/L (ref 22–32)
Calcium: 8.6 mg/dL — ABNORMAL LOW (ref 8.9–10.3)
Creatinine, Ser: 2.51 mg/dL — ABNORMAL HIGH (ref 0.61–1.24)
GFR calc Af Amer: 30 mL/min — ABNORMAL LOW (ref >60)
GFR, EST NON AFRICAN AMERICAN: 25 mL/min — AB (ref >60)
GLUCOSE: 111 mg/dL — AB (ref 65–99)
POTASSIUM: 3.3 mmol/L — AB (ref 3.5–5.1)
Sodium: 140 mmol/L (ref 135–145)

## 2015-10-31 LAB — GLUCOSE, CAPILLARY: Glucose-Capillary: 102 mg/dL — ABNORMAL HIGH (ref 65–99)

## 2015-10-31 MED ORDER — POTASSIUM CHLORIDE CRYS ER 20 MEQ PO TBCR
40.0000 meq | EXTENDED_RELEASE_TABLET | Freq: Once | ORAL | Status: AC
  Administered 2015-10-31: 40 meq via ORAL
  Filled 2015-10-31: qty 2

## 2015-10-31 MED ORDER — POTASSIUM CHLORIDE CRYS ER 20 MEQ PO TBCR: 20.0000 meq | EXTENDED_RELEASE_TABLET | Freq: Two times a day (BID) | ORAL | Status: DC

## 2015-10-31 MED ORDER — TORSEMIDE 20 MG PO TABS: 40.0000 mg | ORAL_TABLET | Freq: Two times a day (BID) | ORAL | Status: DC

## 2015-10-31 MED ORDER — TORSEMIDE 20 MG PO TABS
40.0000 mg | ORAL_TABLET | Freq: Two times a day (BID) | ORAL | Status: DC
  Filled 2015-10-31: qty 2

## 2015-10-31 MED ORDER — METOLAZONE 2.5 MG PO TABS: 2.5000 mg | ORAL_TABLET | Freq: Every day | ORAL | Status: DC | PRN

## 2015-10-31 NOTE — Progress Notes (Signed)
Patient ID: Dominic [name], male   DOB: 12-05-1950, 65 y.o.   MRN: [id number]     Subjective:    Remains SOB  Objective:   Temp:  [97.8 F (36.6 C)-97.9 F (36.6 C)] 97.8 F (36.6 C) (05/21 0643) Pulse Rate:  [64-68] 68 (05/21 0643) Resp:  [16-18] 18 (05/21 0643) BP: (110-135)/(40-57) 135/57 mmHg (05/21 0643) SpO2:  [93 %-98 %] 98 % (05/21 0643) Weight:  [267 lb 1.6 oz (121.156 kg)] 267 lb 1.6 oz (121.156 kg) (05/21 0643) Last BM Date: 10/26/15  Filed Weights   10/29/15 1755 10/30/15 0646 10/31/15 0643  Weight: 267 lb 4.8 oz (121.246 kg) 265 lb 3.2 oz (120.294 kg) 267 lb 1.6 oz (121.156 kg)    Intake/Output Summary (Last 24 hours) at 10/31/15 0808 Last data filed at 10/31/15 0700  Gross per 24 hour  Intake    720 ml  Output   1350 ml  Net   -630 ml    Telemetry: V-paced Exam:  General: NAD  HEENT: sclera clear, throat clear  Resp: CTAB  Cardiac: RRR, no m/rg, no jvd  GI: abdomen soft, NT, ND  MSK:1+ LLE edema (he reports this is chronic)  Neuro: no focal deficits  Psych: appropriate affect  Lab Results:  Basic Metabolic Panel:  Recent Labs Lab 10/29/15 1113 10/29/15 1752 10/30/15 0725 10/31/15 0501  NA 140  --  141 140  K 3.3*  --  3.9 3.3*  CL 100*  --  103 108  CO2 28  --  26 25  GLUCOSE 154*  --  112* 111*  BUN 48*  --  41* 35*  CREATININE 3.30* 3.16* 2.88* 2.51*  CALCIUM 9.0  --  9.1 8.6*    Liver Function Tests:  Recent Labs Lab 10/29/15 1113  AST 21  ALT 18  ALKPHOS 85  BILITOT 0.3  PROT 6.7  ALBUMIN 3.2*    CBC:  Recent Labs Lab 10/29/15 1113 10/29/15 1752  WBC 7.6 6.1  HGB 11.6* 11.5*  HCT 35.4* 35.4*  MCV 89.8 89.8  PLT 207 203    Cardiac Enzymes:  Recent Labs Lab 10/29/15 1752 10/30/15 0002 10/30/15 0725  TROPONINI 0.03 <0.03 0.03    BNP: No results for input(s): PROBNP in the last 8760 hours.  Coagulation: No results for input(s): INR in the last 168 hours.  ECG:   Medications:   Scheduled  Medications: . amiodarone  200 mg Oral Daily  . aspirin  81 mg Oral Daily  . atorvastatin  20 mg Oral Daily  . cholecalciferol  2,000 Units Oral Daily  . clopidogrel  75 mg Oral Daily  . DULoxetine  60 mg Oral BID  . enoxaparin (LOVENOX) injection  40 mg Subcutaneous Q24H  . fluticasone  1 spray Each Nare Daily  . gabapentin  100 mg Oral TID  . glipiZIDE  2.5 mg Oral QAC breakfast  . insulin aspart  0-9 Units Subcutaneous TID WC  . isosorbide mononitrate  60 mg Oral Daily  . metoprolol succinate  50 mg Oral Daily  . potassium chloride SA  20 mEq Oral BID  . sodium chloride flush  3 mL Intravenous Q12H  . sodium chloride flush  3 mL Intravenous Q12H  . sodium chloride flush  3 mL Intravenous Q12H  . topiramate  50 mg Oral BID    Infusions:    PRN Medications: sodium chloride, sodium chloride, acetaminophen, albuterol, HYDROcodone-acetaminophen, nitroGLYCERIN, ondansetron (ZOFRAN) IV, sodium chloride flush, sodium chloride flush, sodium chloride flush  Assessment/Plan   1. Chronic Systolic HF - admitted with SOB, reported weight gain.  - Echo at [facility] Jan 2017 with LVEF 30%. - 10/2015 echo LVEF 45-50%, cannot evaluate diastolic function, normal MVR --Reports a dry weight around 260Lbs, with admit weight at 274Lbs - 10/29/15 RHC with PCWP 8, CI 2.2. Overall low filling presssures. Mean PA 20. Of note even with low filling pressures he still has LE edema, probable component of venous insufficiency.  - CXR no acute process, BNP 124.   - will discharge on torsemide 40mg  bid, can take metolazone 2.5mg  daily prn weight gain greater than 4 pounds.   2. CAD - CABG 2015 PCI-BMS ostial LCX Sept 2015 04/05/2015 cath 50% distal LM, 90% prox LAD, old stent prox LAD 70% ISR, patent LIMA-LAD, 80% prox RCA PCI-DES x2 RCA and LCX - recent atypical  chest pain - troponins are negative, EKG ventricular paced - to me he describes atypical chest pain. Sharp pain midchest worst with deep  breathing, lasts only about 1 minute. Echo shows improved LVEF. No further workup at this time.   3. CKD IV - Cr trending down as his diuretics are held.   4. Biventricular pacemaker  5. Bioprosthetic MVR - normal function by echo this admi   6. SOB - low filling pressures on recent RHC, echo shows improved and near normal LVEF with normal functioning MVR. Symptoms do not appear to be cardiac at this time.  - 30 year smoking history, he does not recall every having PFTs. Will need as outpatient. Continue prn albuterol.    Will discharge home today, needs f/u in 2-3 weeks.  Carlyle Dolly, M.D.

## 2015-10-31 NOTE — Discharge Instructions (Signed)
Please see your PCP for evaluation of lung function through a test called pulmonary function  If not already doing so, would ear compression stockings  Venous Stasis or Chronic Venous Insufficiency Chronic venous insufficiency, also called venous stasis, is a condition that affects the veins in the legs. The condition prevents blood from being pumped through these veins effectively. Blood may no longer be pumped effectively from the legs back to the heart. This condition can range from mild to severe. With proper treatment, you should be able to continue with an active life. CAUSES  Chronic venous insufficiency occurs when the vein walls become stretched, weakened, or damaged or when valves within the vein are damaged. Some common causes of this include:  High blood pressure inside the veins (venous hypertension).  Increased blood pressure in the leg veins from long periods of sitting or standing.  A blood clot that blocks blood flow in a vein (deep vein thrombosis).  Inflammation of a superficial vein (phlebitis) that causes a blood clot to form. RISK FACTORS Various things can make you more likely to develop chronic venous insufficiency, including:  Family history of this condition.  Obesity.  Pregnancy.  Sedentary lifestyle.  Smoking.  Jobs requiring long periods of standing or sitting in one place.  Being a certain age. Women in their 83s and 56s and men in their 39s are more likely to develop this condition. SIGNS AND SYMPTOMS  Symptoms may include:   Varicose veins.  Skin breakdown or ulcers.  Reddened or discolored skin on the leg.  Brown, smooth, tight, and painful skin just above the ankle, usually on the inside surface (lipodermatosclerosis).  Swelling. DIAGNOSIS  To diagnose this condition, your health care provider will take a medical history and do a physical exam. The following tests may be ordered to confirm the diagnosis:  Duplex ultrasound--A  procedure that produces a picture of a blood vessel and nearby organs and also provides information on blood flow through the blood vessel.  Plethysmography--A procedure that tests blood flow.  A venogram, or venography--A procedure used to look at the veins using X-ray and dye. TREATMENT The goals of treatment are to help you return to an active life and to minimize pain or disability. Treatment will depend on the severity of the condition. Medical procedures may be needed for severe cases. Treatment options may include:   Use of compression stockings. These can help with symptoms and lower the chances of the problem getting worse, but they do not cure the problem.  Sclerotherapy--A procedure involving an injection of a material that "dissolves" the damaged veins. Other veins in the network of blood vessels take over the function of the damaged veins.  Surgery to remove the vein or cut off blood flow through the vein (vein stripping or laser ablation surgery).  Surgery to repair a valve. HOME CARE INSTRUCTIONS   Wear compression stockings as directed by your health care provider.  Only take over-the-counter or prescription medicines for pain, discomfort, or fever as directed by your health care provider.  Follow up with your health care provider as directed. SEEK MEDICAL CARE IF:   You have redness, swelling, or increasing pain in the affected area.  You see a red streak or line that extends up or down from the affected area.  You have a breakdown or loss of skin in the affected area, even if the breakdown is small.  You have an injury to the affected area. SEEK IMMEDIATE MEDICAL CARE IF:  You have an injury and open wound in the affected area.  Your pain is severe and does not improve with medicine.  You have sudden numbness or weakness in the foot or ankle below the affected area, or you have trouble moving your foot or ankle.  You have a fever or persistent symptoms for  more than 2-3 days.  You have a fever and your symptoms suddenly get worse. MAKE SURE YOU:   Understand these instructions.  Will watch your condition.  Will get help right away if you are not doing well or get worse.   This information is not intended to replace advice given to you by your health care provider. Make sure you discuss any questions you have with your health care provider.   Document Released: 10/02/2006 Document Revised: 03/19/2013 Document Reviewed: 02/03/2013 Elsevier Interactive Patient Education 2016 [place]. Heart Failure Heart failure means your heart has trouble pumping blood. This makes it hard for your body to work well. Heart failure is usually a long-term (chronic) condition. You must take good care of yourself and follow your doctor's treatment plan. HOME CARE  Take your heart medicine as told by your doctor.  Do not stop taking medicine unless your doctor tells you to.  Do not skip any dose of medicine.  Refill your medicines before they run out.  Take other medicines only as told by your doctor or pharmacist.  Stay active if told by your doctor. The elderly and people with severe heart failure should talk with a doctor about physical activity.  Eat heart-healthy foods. Choose foods that are without trans fat and are low in saturated fat, cholesterol, and salt (sodium). This includes fresh or frozen fruits and vegetables, fish, lean meats, fat-free or low-fat dairy foods, whole grains, and high-fiber foods. Lentils and dried peas and beans (legumes) are also good choices.  Limit salt if told by your doctor.  Cook in a healthy way. Roast, grill, broil, bake, poach, steam, or stir-fry foods.  Limit fluids as told by your doctor.  Weigh yourself every morning. Do this after you pee (urinate) and before you eat breakfast. Write down your weight to give to your doctor.  Take your blood pressure and write it down if your doctor tells you  to.  Ask your doctor how to check your pulse. Check your pulse as told.  Lose weight if told by your doctor.  Stop smoking or chewing tobacco. Do not use gum or patches that help you quit without your doctor's approval.  Schedule and go to doctor visits as told.  Nonpregnant women should have no more than 1 drink a day. Men should have no more than 2 drinks a day. Talk to your doctor about drinking alcohol.  Stop illegal drug use.  Stay current with shots (immunizations).  Manage your health conditions as told by your doctor.  Learn to manage your stress.  Rest when you are tired.  If it is really hot outside:  Avoid intense activities.  Use air conditioning or fans, or get in a cooler place.  Avoid caffeine and alcohol.  Wear loose-fitting, lightweight, and light-colored clothing.  If it is really cold outside:  Avoid intense activities.  Layer your clothing.  Wear mittens or gloves, a hat, and a scarf when going outside.  Avoid alcohol.  Learn about heart failure and get support as needed.  Get help to maintain or improve your quality of life and your ability to care for yourself as  needed. GET HELP IF:   You gain weight quickly.  You are more short of breath than usual.  You cannot do your normal activities.  You tire easily.  You cough more than normal, especially with activity.  You have any or more puffiness (swelling) in areas such as your hands, feet, ankles, or belly (abdomen).  You cannot sleep because it is hard to breathe.  You feel like your heart is beating fast (palpitations).  You get dizzy or light-headed when you stand up. GET HELP RIGHT AWAY IF:   You have trouble breathing.  There is a change in mental status, such as becoming less alert or not being able to focus.  You have chest pain or discomfort.  You faint. MAKE SURE YOU:   Understand these instructions.  Will watch your condition.  Will get help right away if you  are not doing well or get worse.   This information is not intended to replace advice given to you by your health care provider. Make sure you discuss any questions you have with your health care provider.   Document Released: 03/07/2008 Document Revised: 06/19/2014 Document Reviewed: 07/15/2012 Elsevier Interactive Patient Education [employer].

## 2015-10-31 NOTE — Discharge Summary (Signed)
Discharge Summary    Patient ID: Dominic [name],  MRN: [id number], DOB/AGE: 19-Dec-1950 65 y.o.  Admit date: 10/29/2015 Discharge date: 10/31/2015  Primary Care Provider: Pcp Not In System Primary Cardiologist: Dr. Sallyanne Kuster, MD  Discharge Diagnoses    Active Problems:   Chronic systolic heart failure ([place])   CAD (coronary artery disease)   CKD stage 4 due to type 2 diabetes mellitus ([place])   Venous insufficiency   History of mitral valve replacement with bioprosthetic valve   Allergies Allergies  Allergen Reactions  . Hydrochlorothiazide Itching and Other (See Comments)    Unknown. Hyponatremia  . Amoxicillin-Pot Clavulanate Nausea And Vomiting, Other (See Comments) and Nausea Only    unknown Doesn't know  . Clindamycin Nausea And Vomiting, Nausea Only and Other (See Comments)    Chest pain  . Lisinopril Other (See Comments) and Cough    Unknown. Unknown. hyponatremia Unknown. Unknown.  . Meloxicam Other (See Comments), Itching and Nausea Only    unknown Unknown.  . Topiramate Other (See Comments)  . Tamsulosin Other (See Comments)    dysuria    Diagnostic Studies/Procedures    Right heart catheterization 10/29/2015: Conclusion    Findings:  RA = 7 RV = 30/9 PA = 29/13 (20) PCW = 8 Fick cardiac output/index = 5.3/2.2 PVR = 2.4 WU Ao sat = 98% PA sat = 59%, 59%  Assessment: 1. Low filling pressures  2. Normal cardiac output  Plan/Discussion:  Filling pressures are low. Cardiac output is preserved. Given high RA/PCWP ratio suspect component of RV failure. Will hold diuratics for now. D/w Dr. Recardo Evangelist.   Echocardiogram 10/30/2015: Study Conclusions - Left ventricle: Septal and inferior basal hypokinesis Wall  thickness was increased in a pattern of mild LVH. There was mild  focal basal hypertrophy of the septum. Systolic function was  mildly reduced. The estimated ejection fraction was in the range  of 45% to 50%. The study is not  technically sufficient to allow  evaluation of LV diastolic function. - Aortic valve: There was mild regurgitation. - Mitral valve: Normal appearing MVR with no pervalvular  regurgitation. _____________   History of Present Illness     65 year old male with history of CAD CABG 2015 PCI-BMS ostial LCX Sept 2015 04/05/2015 cath 50% distal LM, 90% prox LAD, old stent prox LAD 70% ISR, patent LIMA-LAD, 80% prox RCA PCI-DES x2 RCA and LCX)/Biventricular ICD/Mitral valve replacement (1/17) /CKD IV/HTN/DM II and post op atrial fibrillation who presented to the [facility] ED with SOB and weight gain on 10/29/15. He was last seen in the office on 10/07/2015 where during this appointment he was complaining of dyspnea, edema and weight gain. At this office visit the scale showed 273 pounds. Plans to give metolazone 2.5mg  in the morning before his torsemide, until his weight decreased by 9 pounds. During a follow-up phone call he reported he was taking his medications appropriately, and his weight was down to 258 pounds.  Patient's wife called the office on 5/18 reporting that his weight was up from [place] on 5/19. He reported that he did take 2 torsemide on 5/18, but none on the morning of 5/19 secondary to his elevated creatinine of 3.68. Patient reported having increased shortness of breath, swelling in his ankles, feet and lower abdomen. He was again advised over the phone to come to [facility] ED for further evaluation.    Hospital Course     Consultants: Case management for evaluation of PT/OT  Patient  reported that symptoms had been waxing and waning since his most recent cardiac intervention back in October. Stated that sometimes his weight was up, other times it was controlled. Reported that he gets frustrated often, given the fluctuations in his weight pattern, even though he has been attempting to stick to his medication regimen. Stated on the evening of 5/18 he developed shortness of breath, and lower  extremity edema became worse. Also reported that he developed intermittent centralized chest pain that was sharp in nature, seemed to exist with rest and exertion. Did report orthopnea, along with PND, saying that he uses at least 2-3 pillows at night to sleep. Reported a worsening renal function over the past couple of months, and has seen nephrology states in the past but with no plans for dialysis yet.   In the ED he was noted to have a creatinine of 3.3, which was slightly improved from previous reading of 3.681 a week ago. Hemoglobin was stable, point of care troponin was negative, BNP slightly elevated at 124.6. chest x-ray was negative for edema/infection. EKG is relatively unchanged showing AV paced rhythm with a rate of 64. He was given one dose of IV Lasix 40 mg, with minimal UOP of 350 mL. Blood pressure was soft at 105/60. He underwent RHC on 5/19 that showed PCWP of 8, CI 2.2, overall low filling pressures, mean PA 20. Given his RA/PCWP ratio there was some question about possible RV dysfunction. Diuretics were held. Of note, even with his low filling pressures he still had LE edema, which was felt to be venous insufficiency. We would benefit from compression stockings. Echo showed 45-50%, mild LVH, study not technically sufficient to allow for LV diastolic function, mild AI, mitral valve with normal appearing MVR with no perivalvular regurgitation. His troponin level cycled though as negative, and his chest pain was felt to be atypical, lasting only 1 minute in duration. He underwent a trial of nebs on 5/20, continued SOB. No prior PFTs. He is noted to be on amiodarone. He will need these as an outpatient. He has been seen by Dr. Harl Bowie, MD and felt to be stable for discharge.  _____________  Discharge Vitals Blood pressure 135/57, pulse 68, temperature 97.8 F (36.6 C), temperature source Oral, resp. rate 18, height 6' (1.829 m), weight 267 lb 1.6 oz (121.156 kg), SpO2 98 %.  Filed Weights    10/29/15 1755 10/30/15 0646 10/31/15 0643  Weight: 267 lb 4.8 oz (121.246 kg) 265 lb 3.2 oz (120.294 kg) 267 lb 1.6 oz (121.156 kg)    Labs & Radiologic Studies    CBC  Recent Labs  10/29/15 1113 10/29/15 1752  WBC 7.6 6.1  HGB 11.6* 11.5*  HCT 35.4* 35.4*  MCV 89.8 89.8  PLT 207 [id number]   Basic Metabolic Panel  Recent Labs  10/30/15 0725 10/31/15 0501  NA 141 140  K 3.9 3.3*  CL 103 108  CO2 26 25  GLUCOSE 112* 111*  BUN 41* 35*  CREATININE 2.88* 2.51*  CALCIUM 9.1 8.6*   Liver Function Tests  Recent Labs  10/29/15 1113  AST 21  ALT 18  ALKPHOS 85  BILITOT 0.3  PROT 6.7  ALBUMIN 3.2*   No results for input(s): LIPASE, AMYLASE in the last 72 hours. Cardiac Enzymes  Recent Labs  10/29/15 1752 10/30/15 0002 10/30/15 0725  TROPONINI 0.03 <0.03 0.03   BNP Invalid input(s): POCBNP D-Dimer No results for input(s): DDIMER in the last 72 hours. Hemoglobin A1C No results  for input(s): HGBA1C in the last 72 hours. Fasting Lipid Panel No results for input(s): CHOL, HDL, LDLCALC, TRIG, CHOLHDL, LDLDIRECT in the last 72 hours. Thyroid Function Tests No results for input(s): TSH, T4TOTAL, T3FREE, THYROIDAB in the last 72 hours.  Invalid input(s): FREET3 _____________  Dg Chest 2 View  10/29/2015  CLINICAL DATA:  Shortness of breath. EXAM: CHEST  2 VIEW COMPARISON:  None. FINDINGS: There is a left biventricular ICD. Heart size is within normal limits. Median sternotomy wires are present. Evidence for prior mitral valve surgery. Lungs are clear without pulmonary edema or focal airspace disease. Degenerative changes in the thoracic spine. No large pleural effusions. IMPRESSION: No active cardiopulmonary disease. Electronically Signed   By: Markus Daft M.D.   On: 10/29/2015 12:12   Disposition   Pt is being discharged home today in good condition.  Follow-up Plans & Appointments    Follow-up Information    Follow up with [name], MD On 11/05/2015.    Specialty:  Cardiology   Why:  Appointment time 10:45 AM   Contact information:   [address] [place] [address] [phone]        Discharge Medications   Current Discharge Medication List    CONTINUE these medications which have CHANGED   Details  metolazone (ZAROXOLYN) 2.5 MG tablet Take 1 tablet (2.5 mg total) by mouth daily as needed. For weight gain greater than 4 pounds Qty: 30 tablet, Refills: 5    potassium chloride SA (K-DUR,KLOR-CON) 20 MEQ tablet Take 1 tablet (20 mEq total) by mouth 2 (two) times daily. Take 1 additional potassium tab when taking metolazone Qty: 60 tablet, Refills: 5    torsemide (DEMADEX) 20 MG tablet Take 2 tablets (40 mg total) by mouth 2 (two) times daily. Qty: 60 tablet, Refills: 5      CONTINUE these medications which have NOT CHANGED   Details  albuterol (PROVENTIL HFA;VENTOLIN HFA) 108 (90 Base) MCG/ACT inhaler Inhale 2 puffs into the lungs every 6 (six) hours as needed for wheezing or shortness of breath.    amiodarone (PACERONE) 200 MG tablet Take 200 mg by mouth daily.    aspirin 81 MG tablet Take 81 mg by mouth daily.    atorvastatin (LIPITOR) 20 MG tablet Take 20 mg by mouth daily.    butalbital-acetaminophen-caffeine (FIORICET WITH CODEINE) 50-325-40-30 MG capsule Take 1 capsule by mouth every 4 (four) hours as needed for headache.    Cholecalciferol (VITAMIN D3) 2000 units TABS Take 1 tablet by mouth daily.    clopidogrel (PLAVIX) 75 MG tablet Take 75 mg by mouth daily.    DULoxetine (CYMBALTA) 60 MG capsule Take 60 mg by mouth 2 (two) times daily.     fluticasone (FLONASE) 50 MCG/ACT nasal spray Place 1 spray into both nostrils daily.    gabapentin (NEURONTIN) 100 MG capsule Take 100 mg by mouth 3 (three) times daily.    glipiZIDE (GLUCOTROL) 2.5 mg TABS tablet Take 2.5 mg by mouth daily before breakfast.    glucose blood test strip 1 each by Other route as needed for other. Use as instructed      HYDROcodone-acetaminophen (NORCO/VICODIN) 5-325 MG tablet Take 1 tablet by mouth every 6 (six) hours as needed for moderate pain.    isosorbide mononitrate (IMDUR) 60 MG 24 hr tablet Take 60 mg by mouth daily.    metFORMIN (GLUCOPHAGE) 1000 MG tablet Take 1,000 mg by mouth daily with breakfast.     metoprolol succinate (TOPROL-XL) 25  MG 24 hr tablet Take 50 mg by mouth daily.     nitroGLYCERIN (NITROSTAT) 0.4 MG SL tablet Place 0.4 mg under the tongue every 5 (five) minutes as needed for chest pain.    topiramate (TOPAMAX) 25 MG tablet Take 50 mg by mouth 2 (two) times daily.       STOP taking these medications     cephALEXin (KEFLEX) 500 MG capsule          Aspirin prescribed at discharge?  Yes High Intensity Statin Prescribed? (Lipitor 40-80mg  or Crestor 20-40mg ): Yes Beta Blocker Prescribed? Yes For EF <40%, was ACEI/ARB Prescribed? No: CKD stage IV, EF >40% ADP Receptor Inhibitor Prescribed? (i.e. Plavix etc.-Includes Medically Managed Patients): Yes For EF <40%, Aldosterone Inhibitor Prescribed? No: EF >40% Was EF assessed during THIS hospitalization? Yes Was Cardiac Rehab II ordered? (Included Medically managed Patients): No:    Outstanding Labs/Studies   none  Duration of Discharge Encounter   Greater than 30 minutes including physician time.  Signed, Christell Faith PA-C 10/31/2015, 9:10 AM

## 2015-11-01 ENCOUNTER — Encounter (HOSPITAL_COMMUNITY): Payer: Self-pay | Admitting: Internal Medicine

## 2015-11-02 LAB — CUP PACEART INCLINIC DEVICE CHECK
Date Time Interrogation Session: 20170523110442
Lead Channel Setting Pacing Amplitude: 3.5 V
Lead Channel Setting Pacing Pulse Width: 0.4 ms
MDC IDC SET LEADCHNL LV PACING AMPLITUDE: 3.5 V
MDC IDC SET LEADCHNL RV PACING AMPLITUDE: 3.5 V
MDC IDC SET LEADCHNL RV PACING PULSEWIDTH: 0.4 ms
Pulse Gen Serial Number: 160713

## 2015-11-05 ENCOUNTER — Telehealth: Payer: Self-pay | Admitting: *Deleted

## 2015-11-05 ENCOUNTER — Ambulatory Visit (INDEPENDENT_AMBULATORY_CARE_PROVIDER_SITE_OTHER): Payer: Medicare HMO | Admitting: Cardiovascular Disease

## 2015-11-05 ENCOUNTER — Encounter: Payer: Self-pay | Admitting: Cardiovascular Disease

## 2015-11-05 VITALS — BP 128/60 | HR 69 | Ht 72.0 in | Wt 277.2 lb

## 2015-11-05 DIAGNOSIS — E119 Type 2 diabetes mellitus without complications: Secondary | ICD-10-CM

## 2015-11-05 DIAGNOSIS — Z79899 Other long term (current) drug therapy: Secondary | ICD-10-CM

## 2015-11-05 DIAGNOSIS — E785 Hyperlipidemia, unspecified: Secondary | ICD-10-CM

## 2015-11-05 DIAGNOSIS — I5022 Chronic systolic (congestive) heart failure: Secondary | ICD-10-CM

## 2015-11-05 DIAGNOSIS — Z952 Presence of prosthetic heart valve: Secondary | ICD-10-CM

## 2015-11-05 DIAGNOSIS — I25118 Atherosclerotic heart disease of native coronary artery with other forms of angina pectoris: Secondary | ICD-10-CM

## 2015-11-05 DIAGNOSIS — N184 Chronic kidney disease, stage 4 (severe): Secondary | ICD-10-CM

## 2015-11-05 DIAGNOSIS — J449 Chronic obstructive pulmonary disease, unspecified: Secondary | ICD-10-CM | POA: Diagnosis not present

## 2015-11-05 DIAGNOSIS — E669 Obesity, unspecified: Secondary | ICD-10-CM

## 2015-11-05 DIAGNOSIS — Z953 Presence of xenogenic heart valve: Secondary | ICD-10-CM

## 2015-11-05 DIAGNOSIS — E1169 Type 2 diabetes mellitus with other specified complication: Secondary | ICD-10-CM

## 2015-11-05 DIAGNOSIS — E1122 Type 2 diabetes mellitus with diabetic chronic kidney disease: Secondary | ICD-10-CM

## 2015-11-05 DIAGNOSIS — I872 Venous insufficiency (chronic) (peripheral): Secondary | ICD-10-CM

## 2015-11-05 DIAGNOSIS — Z95810 Presence of automatic (implantable) cardiac defibrillator: Secondary | ICD-10-CM

## 2015-11-05 MED ORDER — TIOTROPIUM BROMIDE MONOHYDRATE 18 MCG IN CAPS: 18.0000 ug | ORAL_CAPSULE | Freq: Every day | RESPIRATORY_TRACT | Status: DC

## 2015-11-05 NOTE — Patient Instructions (Signed)
Dr Sallyanne Kuster recommends that you continue on your current medications as directed. Please refer to the Current Medication list given to you today.  Your physician has recommended that you have a pulmonary function test. Pulmonary Function Tests are a group of tests that measure how well air moves in and out of your lungs.  Your physician recommends that you weigh, daily, at the same time every day, and in the same amount of clothing. Please record your daily weights on the handout provided and bring it to your next appointment. Please call the office if your weight is 275+ pounds.  Dr Sallyanne Kuster recommends that you schedule a follow-up appointment in 1 month.  If you need a refill on your cardiac medications before your next appointment, please call your pharmacy.

## 2015-11-05 NOTE — Telephone Encounter (Signed)
Spoke with patient regarding appointment for PFT's ordered by Dr. Sallyanne Kuster.  Scheduled for 11/18/15 at 10:00 am---arrive [facility] admitting at 9:45am for check in--No Smoking. No Caffeine and No Inhalers 4 hours prior to testing.  Patient voiced his understanding.

## 2015-11-06 DIAGNOSIS — J449 Chronic obstructive pulmonary disease, unspecified: Secondary | ICD-10-CM | POA: Insufficient documentation

## 2015-11-06 NOTE — Progress Notes (Signed)
Patient ID: Dominic [name], male   DOB: Sep 24, 1950, 65 y.o.   MRN: [id number] Patient ID: Dominic [name], male   DOB: May 31, 1951, 65 y.o.   MRN: [id number]  Patient ID: Dominic [name], male   DOB: 17-Mar-1951, 65 y.o.   MRN: [id number]    Cardiology Office Note    Date:  09/05/2015   ID:  Dominic [name], DOB Feb 08, 1951, MRN [id number]  PCP:  Pcp Not In System  Cardiologist:   [name], MD   Chief Complaint  Patient presents with  . New Patient (Initial Visit)    has some chest tightness, has shortness of breath, has edema in legs,no pain or cramping in legs, has lightheadedness or dizziness, has fatigue    History of Present Illness:  Dominic [name] is a 65 y.o. male with severe and complex cardiac problems.   On 5/19 he was hospitalized for persistent edema and dyspnea despite diuretic treatment leading to acute on chronic renal failure. His weight was 267 lb, creatinine was 3.3. Right heart cath showed:  RA = 7 RV = 30/9 PA = 29/13 (20) PCW = 8 Fick cardiac output/index = 5.3/2.2 PVR = 2.4 WU Ao sat = 98% PA sat = 59%, 59%  1. Low filling pressures  2. Normal cardiac output Filling pressures are low. Cardiac output is preserved. Given high RA/PCWP ratio suspect component of RV failure.   Echocardiogram 10/30/2015: Study Conclusions - Left ventricle: Septal and inferior basal hypokinesis Wall  thickness was increased in a pattern of mild LVH. There was mild  focal basal hypertrophy of the septum. Systolic function was  mildly reduced. The estimated ejection fraction was in the range  of 45% to 50%. The study is not technically sufficient to allow  evaluation of LV diastolic function. - Aortic valve: There was mild regurgitation. - Mitral valve: Normal appearing MVR with no pervalvular  regurgitation.  He denies cough, hemoptysis, abdominal pain, intolerance to heat or cold, polyuria/polydipsia. He has occasional dizziness and lightheadedness especially with  changes in position. He complains of severe fatigue.  He is carefully monitoring his weight at home and brings a detailed weight log, with daily checks. Today his home scale showed 271 pounds in pj's. Our office scale shows 277 pounds (he weighed the same on his home scale when fully dressed).   Interrogation of his CRT-D device shows normal function. Estimated generator longevity is 8 years. Last charge time was 9 seconds. All lead parameters appear to be excellent. The left ventricular lead pacing configuration is LV ring4-can. His heart rate variability footprint does show improvement compared with January. There is 100% BiV pacing. There is 23% atrial pacing. There has been no atrial fibrillation or ventricular tachycardia.  Dominic [name] has severe ischemic cardiomyopathy with left ventricular ejection fraction estimated to be approximately 30% by echocardiogram performed in October 2016. That echo describes septal and apical akinesis with global hypokinesis. He had evidence of restrictive filling as well as moderate right ventricular systolic dysfunction and moderate aortic insufficiency. Echo in May 2017 (after PCI and CRT) shows LVEF 45-50% and normal function of the mitral valve prosthesis. Right heart catheterization in May 2017 showed right atrial pressure 8, mean wedge pressure 6, cardiac index 2.2 L/minute.vIn January he was hospitalized for acute renal insufficiency related to acute watery diarrhea and continued treatment with diuretics and RAAS inhibitors (prolactin and ace inhibitors were stopped). He was hospitalized at [facility] in May 2017 and underwent right heart catheterization, showing that he was "dry".  He has an extensive history of coronary artery disease. He tells me that he thinks he has had 9 cardiac catheterizations and 5 stents. At least on one admission he had 2 episodes of cardiac arrest. I think this was during his hospitalization for bypass surgery/valve replacement. He  underwent bypass surgery in [place] in 2015 with synchronous mitral valve replacement with a biological prosthesis. He has a history of previous stent placed in the LAD artery, type of stent and date of procedure unknown. He underwent placement of a bare metal stent to the ostium of the left circumflex coronary artery in September 2015. He subsequently underwent placement of 2 drug-eluting stents at [facility] in October 2016, placed in the right coronary artery and left circumflex coronary artery respectively. At the time of that cardiac catheterization he had 50% stenosis in the distal left main, 90% proximal LAD, 70% in-stent restenosis proximal LAD, patent LIMA to distal LAD, 80% RCA. No mention is made of his second bypass graft, which I presume is occluded. He is on combination aspirin/clopidogrel.  At the time of his bypass surgery received a 31 mm [employer] mitral bioprosthetic valve. By echocardiogram last October 2016 the mean transvalvular gradient was 5 mmHg. The same echocardiogram described a trileaflet unrestricted aortic valve with moderate insufficiency.   His chart describes a history of postoperative atrial fibrillation. He is not on anticoagulants. He takes low-dose amiodarone.  He has a history of left bundle branch block and he received a CRT-D [employer] device on 07/01/2015.  He has advanced chronic kidney disease. With recent baseline creatinine around 2.0. During his hospitalization in January 2017 the peak creatinine reached 4.6. He has type 2 diabetes mellitus and does not require insulin, complicated by kidney disease and neuropathy. He is still on metformin although previous notes expressed concern regarding this medication in the setting of severe cardiac and renal disease. He takes atorvastatin and a relatively low dose. He has long-standing treated hypertension. He is a former smoker and has a history of COPD and takes bronchodilators. He has a  history of migraine headaches treated with Fioricet.     Past Medical History  Diagnosis Date  . History of mitral valve replacement with bioprosthetic valve 09/05/2015    31 mm Edwards 2015, Dr. Jerelene Redden  . Acute on chronic combined systolic (congestive) and diastolic (congestive) heart failure ([place]) 09/05/2015  . CAD (coronary artery disease) 09/05/2015    CABG 2015 PCI-BMS ostial LCX Sept 2015 04/05/2015 cath 50% distal LM, 90% prox LAD, old stent prox LAD 70% ISR, patent LIMA-LAD, 80% prox RCA PCI-DES x2 RCA and LCX   . Biventricular ICD (implantable cardioverter-defibrillator) - [facility] 09/05/2015    Jul 01, 2015 [facility] Dynagen X4  . CKD stage 4 due to type 2 diabetes mellitus ([place]) 09/05/2015  . Essential hypertension 09/05/2015  . Diabetes mellitus, type 2 ([place]) 09/05/2015  . Postoperative atrial fibrillation ([place]) 09/05/2015    Past Surgical History  Procedure Laterality Date  . Coronary angioplasty    . Cardiac valve replacement  2015    81mm Edwards bioprosthesis  . Coronary artery bypass graft  2015    [facility], Dr. Jerelene Redden.    Outpatient Encounter Prescriptions as of 09/03/2015  Medication Sig  . albuterol (PROVENTIL HFA;VENTOLIN HFA) 108 (90 Base) MCG/ACT inhaler Inhale 2 puffs into the lungs every 6 (six) hours as needed for wheezing or shortness of breath.  [name] amiodarone (PACERONE) 200 MG tablet Take 200 mg by mouth daily.  [name]  aspirin 81 MG tablet Take 81 mg by mouth daily.  [name] atorvastatin (LIPITOR) 20 MG tablet Take 20 mg by mouth daily.  . butalbital-acetaminophen-caffeine (FIORICET WITH CODEINE) 50-325-40-30 MG capsule Take 1 capsule by mouth every 4 (four) hours as needed for headache.  . Cholecalciferol (VITAMIN D3) 2000 units TABS Take 1 tablet by mouth daily.  . clopidogrel (PLAVIX) 75 MG tablet Take 75 mg by mouth daily.  . DULoxetine (CYMBALTA) 60 MG capsule Take 120 mg by mouth daily.  . fluticasone (FLONASE) 50 MCG/ACT nasal spray Place 1 spray into both nostrils daily.  [name]  gabapentin (NEURONTIN) 100 MG capsule Take 100 mg by mouth 3 (three) times daily.  [name] glucose blood test strip 1 each by Other route as needed for other. Use as instructed  . HYDROcodone-acetaminophen (NORCO/VICODIN) 5-325 MG tablet Take 1 tablet by mouth every 6 (six) hours as needed for moderate pain.  . metFORMIN (GLUCOPHAGE) 1000 MG tablet Take 1,000 mg by mouth 2 (two) times daily with a meal.  . metoprolol succinate (TOPROL-XL) 25 MG 24 hr tablet Take 25 mg by mouth daily.  . nitroGLYCERIN (NITROSTAT) 0.4 MG SL tablet Place 0.4 mg under the tongue every 5 (five) minutes as needed for chest pain.  [name] topiramate (TOPAMAX) 25 MG tablet Take 50 mg by mouth at bedtime.  . torsemide (DEMADEX) 20 MG tablet Take 2 tablets (40 mg total) by mouth 2 (two) times daily.  .     No facility-administered encounter medications on file as of 09/03/2015.    Allergies:   Hydrochlorothiazide; Amoxicillin-pot clavulanate; Clindamycin; Lisinopril; Meloxicam; Topiramate; and Tamsulosin   Social History   Social History  . Marital Status: Married    Spouse Name: N/A  . Number of Children: N/A  . Years of Education: N/A   Social History Main Topics  . Smoking status: Former Research scientist (life sciences)  . Smokeless tobacco: Never Used  . Alcohol Use: None  . Drug Use: None  . Sexual Activity: Not Asked   Other Topics Concern  . None   Social History Narrative     Family History:  The patient's family history is significant for the absence of known premature cardiac disease, valvular heart problems or arrhythmia.  ROS:   Please see the history of present illness.    ROS All other systems reviewed and are negative.   PHYSICAL EXAM:   VS:  BP 142/80 mmHg  Pulse 66  Ht 5\' 11"  (1.803 m)  Wt 123.095 kg (271 lb 6 oz)  BMI 37.87 kg/m2   GEN: Severely obese, well developed, in no acute distress HEENT: norma, no exophtalmos Neck: no JVD, carotid bruits, or masses, no goiter Cardiac: RRR, S2, S3 present; grade 2/6  holosystolic murmur at the left lower sternal border no diastolic murmurs, rubs, or gallops, 1+ left ankle edema, no edema in right ankle Respiratory:  clear to auscultation bilaterally, normal work of breathing; LV left subclavian defibrillator site with well-healed scar GI: soft, nontender, nondistended, + BS MS: no deformity or atrophy Skin: warm and dry, no rash Neuro:  Alert and Oriented x 3, Strength and sensation are intact Psych: euthymic mood, full affect  Wt Readings from Last 3 Encounters:  09/03/15 123.095 kg (271 lb 6 oz)      Studies/Labs Reviewed:   EKG:  EKG is ordered today.  The ekg ordered today demonstrates atrial sensed, ventricular paced, small R wave in lead V1 and V2 and predominantly positive in V6 and 1, QRS 170 ms,  QTC 565 ms  Recent Labs: 09/03/2015: BUN 32*; Creat 2.73*; Potassium 4.4; Sodium 140   Lipid Panel October 2016 total cholesterol 154, LDL 85, HDL 45, triglycerides 123  Additional studies/ records that were reviewed today include:  Extensive review of records from [facility], multiple admissions, cardiac catheterization, echocardiogram, labs    ASSESSMENT:    1. Acute on chronic combined systolic (congestive) and diastolic (congestive) heart failure ([place])   2. Shortness of breath / COPD  3. Coronary artery disease involving native coronary artery of native heart with other form of angina pectoris ([place])   4. History of mitral valve replacement with bioprosthetic valve   5. Biventricular ICD (implantable cardioverter-defibrillator) - [facility]   6. CKD stage 4 due to type 2 diabetes mellitus ([place])   7. Essential hypertension   8. Type 2 diabetes mellitus with stage 4 chronic kidney disease, without long-term current use of insulin ([place])   9. Hyperlipidemia   10. Postoperative atrial fibrillation ([facility])      PLAN:  In order of problems listed above:  1. CHF: Class IIIB symptoms. Symptoms are no different than they were on the day  of his cardiac catheterization, when cardiac filling pressures were low. I think his previous "dry weight" was an underestimation. Set a new target "dry weight" of 275 pounds on his home scale. Note that he had substantial lower extremity edema even though his right atrial pressures were not particularly high. The right atrial pressure was higher than the left atrial, suggesting that he does have a significant component of right heart failure, probably due to both cor pulmonale (obesity, COPD, sleep apnea?) As well as long-standing left heart failure. Left ventricular systolic function is actually improved from last year, probably due to the contribution of both biventricular pacing and the revascularization procedure that he underwent in October. Note that the PA pressure estimated by echo correlated very well with that measured at cardiac cath. 2. Dyspnea: Shortness of breath was just as bad when he underwent his cardiac catheterization and had clearly low cardiac filling pressures. This demonstrates that his dyspnea is primarily due to lung disease. I don't know how long he has been taking amiodarone, but needs to keep the possibility of amiodarone related side effects in mind. Start Spiriva. Scheduled for PFTs with diffusion capacity. 3. CAD: He received 2 drug-eluting stents in October 2016 and should remain on dual antiplatelet therapy until October 2017 at least. If atrial fibrillation is documented will stop aspirin and start a true anticoagulant, probably warfarin in view of his volatile renal function. He has relatively mild angina pectoris. He remains in the window for possible restenosis, but is a poor candidate for repeat angiography due to his renal function. Left ventricular ejection fraction is improved compared to last year. 4. S/p MVR: 31 mm bioprosthesis with normal function by recent echocardiogram. Mitral gradient was lower on the echo performed recently been on the echo from [place]. Avoid  tachycardia. 5. CRT-D: Normal function of his biventricular defibrillator. 100% biventricular pacing. No arrhythmia detected.  6. CKD stage 4: With recent episode of severe acute renal insufficiency. Spironolactone and ACE inhibitor were stopped. I don't believe it is safe to restart these. If blood pressure allows, consider hydralazine/nitrates. 7. HTN: Blood pressure is good today 8. DM: Appears to be well controlled on metformin. Hemoglobin A1c 6.2% in January 2017 9. HLP: His lipid profile is not bad, but ideally his LDL cholesterol should be less than 70 and last October  LDL was 85. Will reevaluate at his next appointment. 10. AFib: I have no details about the diagnosis. Obviously he is at very high embolic risk, but he is not on anticoagulants. It is possible that the decision was made to stop anticoagulants when he received his drug-eluting stents and was placed on aspirin and clopidogrel. It is also possible that the burden of atrial fibrillation was very low and was felt safer to keep him off anticoagulation. We'll take advantage of the fact that he has a rhythm device in place and will only initiate anticoagulation if he develops atrial fibrillation. His chest x-ray was clear on the recent hospital admission, without infiltrates due to either edema or pneumonia with suggestion of pulmonary fibrosis. Continue amiodarone for now, since he seems to be at very high risk for arrhythmia recurrence but the risk/benefit of this agent should be periodically reviewed. Check liver function tests and TSH at least twice yearly. Scheduled for PFTs. I wonder if we can identify some baseline PFTs performed at [facility].  11. Peripheral venous insufficiency: Despite only minimally elevated right atrial pressures at cardiac catheterization, he had very prominent persistent leg edema. This is consistent with substantial venous insufficiency. Keep legs elevated. Recommend compression stockings.  If upcoming PFTs show  restrictive lung disease with substantial diffusion capacity abnormalities, would stop amiodarone therapy. If the abnormalities are primarily obstructive, then his shortness of breath is attributable primarily to COPD.   Patient Instructions  Dr Sallyanne Kuster recommends that you continue on your current medications as directed. Please refer to the Current Medication list given to you today.  Your physician has recommended that you have a pulmonary function test. Pulmonary Function Tests are a group of tests that measure how well air moves in and out of your lungs.  Your physician recommends that you weigh, daily, at the same time every day, and in the same amount of clothing. Please record your daily weights on the handout provided and bring it to your next appointment. Please call the office if your weight is 275+ pounds.  Dr Sallyanne Kuster recommends that you schedule a follow-up appointment in 1 month.  If you need a refill on your cardiac medications before your next appointment, please call your pharmacy.     Mikael Spray, MD  09/05/2015 11:10 AM    [address], [place], [place]  [address] Phone: [phone]; Fax: [phone]

## 2015-11-10 ENCOUNTER — Encounter: Payer: Self-pay | Admitting: Cardiovascular Disease

## 2015-11-16 LAB — CUP PACEART INCLINIC DEVICE CHECK
Date Time Interrogation Session: 20170606125951
Lead Channel Setting Pacing Amplitude: 3.5 V
Lead Channel Setting Pacing Amplitude: 3.5 V
Lead Channel Setting Pacing Pulse Width: 0.4 ms
Lead Channel Setting Pacing Pulse Width: 0.4 ms
MDC IDC PG SERIAL: 160713
MDC IDC SET LEADCHNL RA PACING AMPLITUDE: 3.5 V

## 2015-11-18 ENCOUNTER — Ambulatory Visit (HOSPITAL_COMMUNITY)
Admission: RE | Admit: 2015-11-18 | Discharge: 2015-11-18 | Disposition: A | Discharge status: Home / Self Care | Payer: Medicare HMO | Source: Ambulatory Visit | Attending: Cardiovascular Disease | Admitting: Cardiovascular Disease

## 2015-11-18 ENCOUNTER — Telehealth: Payer: Self-pay | Admitting: Cardiovascular Disease

## 2015-11-18 DIAGNOSIS — J449 Chronic obstructive pulmonary disease, unspecified: Secondary | ICD-10-CM | POA: Diagnosis not present

## 2015-11-18 DIAGNOSIS — Z79899 Other long term (current) drug therapy: Secondary | ICD-10-CM | POA: Insufficient documentation

## 2015-11-18 LAB — PULMONARY FUNCTION TEST
DL/VA % PRED: 62 %
DL/VA: 2.91 ml/min/mmHg/L
DLCO unc % pred: 37 %
DLCO unc: 12.71 ml/min/mmHg
FEF 25-75 POST: 2.66 L/s
FEF 25-75 PRE: 1.43 L/s
FEF2575-%Change-Post: 85 %
FEF2575-%Pred-Post: 93 %
FEF2575-%Pred-Pre: 50 %
FEV1-%Change-Post: 36 %
FEV1-%PRED-PRE: 58 %
FEV1-%Pred-Post: 79 %
FEV1-POST: 2.85 L
FEV1-PRE: 2.09 L
FEV1FVC-%Change-Post: 16 %
FEV1FVC-%PRED-PRE: 75 %
FEV6-%Change-Post: 13 %
FEV6-%PRED-POST: 91 %
FEV6-%Pred-Pre: 80 %
FEV6-POST: 4.16 L
FEV6-Pre: 3.66 L
FEV6FVC-%PRED-POST: 105 %
FEV6FVC-%Pred-Pre: 105 %
FVC-%Change-Post: 16 %
FVC-%PRED-POST: 89 %
FVC-%Pred-Pre: 76 %
FVC-POST: 4.28 L
FVC-Pre: 3.66 L
PRE FEV1/FVC RATIO: 57 %
PRE FEV6/FVC RATIO: 100 %
Post FEV1/FVC ratio: 67 %
Post FEV6/FVC ratio: 100 %
RV % PRED: 110 %
RV: 2.64 L
TLC % PRED: 103 %
TLC: 7.49 L

## 2015-11-18 MED ORDER — ALBUTEROL SULFATE (2.5 MG/3ML) 0.083% IN NEBU
2.5000 mg | INHALATION_SOLUTION | Freq: Once | RESPIRATORY_TRACT | Status: AC
  Administered 2015-11-18: 2.5 mg via RESPIRATORY_TRACT

## 2015-11-18 NOTE — Telephone Encounter (Signed)
New message    The pt wife was wanting the results of the pt test called to her from this morning.

## 2015-11-22 ENCOUNTER — Encounter: Payer: Self-pay | Admitting: Cardiovascular Disease

## 2015-11-22 NOTE — Telephone Encounter (Signed)
Follow-up    The wife is calling to get results on the pt test done last week. No other information given.

## 2015-11-23 NOTE — Telephone Encounter (Signed)
Notes Recorded by Dionne Bucy Truitt, CMA on 11/22/2015 at 5:35 PM Called patient with results. Patient verbalized understanding and agreed with plan.

## 2015-12-02 NOTE — Telephone Encounter (Signed)
Referral placed to [facility] at [facility].

## 2015-12-02 NOTE — Addendum Note (Signed)
Addended by: Diana Eves on: 12/02/2015 01:39 PM   Modules accepted: Orders

## 2015-12-16 ENCOUNTER — Ambulatory Visit (INDEPENDENT_AMBULATORY_CARE_PROVIDER_SITE_OTHER): Payer: Medicare HMO | Admitting: Cardiovascular Disease

## 2015-12-16 ENCOUNTER — Encounter: Payer: Self-pay | Admitting: Cardiovascular Disease

## 2015-12-16 VITALS — BP 127/71 | HR 64 | Ht 72.0 in | Wt 274.0 lb

## 2015-12-16 DIAGNOSIS — E1122 Type 2 diabetes mellitus with diabetic chronic kidney disease: Secondary | ICD-10-CM

## 2015-12-16 DIAGNOSIS — I9789 Other postprocedural complications and disorders of the circulatory system, not elsewhere classified: Secondary | ICD-10-CM

## 2015-12-16 DIAGNOSIS — I25118 Atherosclerotic heart disease of native coronary artery with other forms of angina pectoris: Secondary | ICD-10-CM

## 2015-12-16 DIAGNOSIS — E119 Type 2 diabetes mellitus without complications: Secondary | ICD-10-CM

## 2015-12-16 DIAGNOSIS — I5022 Chronic systolic (congestive) heart failure: Secondary | ICD-10-CM

## 2015-12-16 DIAGNOSIS — E669 Obesity, unspecified: Secondary | ICD-10-CM

## 2015-12-16 DIAGNOSIS — E1169 Type 2 diabetes mellitus with other specified complication: Secondary | ICD-10-CM

## 2015-12-16 DIAGNOSIS — Z95810 Presence of automatic (implantable) cardiac defibrillator: Secondary | ICD-10-CM

## 2015-12-16 DIAGNOSIS — Z952 Presence of prosthetic heart valve: Secondary | ICD-10-CM | POA: Diagnosis not present

## 2015-12-16 DIAGNOSIS — E785 Hyperlipidemia, unspecified: Secondary | ICD-10-CM

## 2015-12-16 DIAGNOSIS — J449 Chronic obstructive pulmonary disease, unspecified: Secondary | ICD-10-CM | POA: Diagnosis not present

## 2015-12-16 DIAGNOSIS — I10 Essential (primary) hypertension: Secondary | ICD-10-CM

## 2015-12-16 DIAGNOSIS — N184 Chronic kidney disease, stage 4 (severe): Secondary | ICD-10-CM

## 2015-12-16 DIAGNOSIS — I872 Venous insufficiency (chronic) (peripheral): Secondary | ICD-10-CM

## 2015-12-16 DIAGNOSIS — Z953 Presence of xenogenic heart valve: Secondary | ICD-10-CM

## 2015-12-16 DIAGNOSIS — I4891 Unspecified atrial fibrillation: Secondary | ICD-10-CM

## 2015-12-16 NOTE — Progress Notes (Signed)
Cardiology Office Note    Date:  12/16/2015   ID:  Dominic [name], DOB [birth year], MRN [id number]  PCP:  Sharmon Leyden, MD  Cardiologist:   [name], MD   Chief Complaint  Patient presents with  . Follow-up    pt c/o SOB    History of Present Illness:  Dominic [name] is a 65 y.o. 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  He was doing poorly with aggressive diuretic therapy for heart failure and right heart catheterization performed earlier this year demonstrated that he actually had low left ventricular filling pressures. Pulmonary function tests confirm that he has advanced chronic lung disease which probably explains much of his chronic dyspnea. He has been started on combination bronchodilator therapy and has had substantial improvement in his symptoms.  He continues to have NYHA functional class II dyspnea, but feels better. I saw him smiling for the first time today. Activity level has improved. His weight on his home scale has been steady around 269-270 kilograms (his home scale shows roughly 5 pounds less than our office scale. He continues to have leg edema which is likely attributable to right heart failure and peripheral venous insufficiency. He has not required adjustment of diuretic therapy since his last appointment.  Interrogation of his CRT-D device shows 100% biventricular pacing, increased levels of activity, and improved heart rate histogram likely as a consequence of more activity. There has been no atrial fibrillation and no ventricular tachycardia. He has 18% atrial pacing. Battery longevity is estimated at about 8 years.  Dominic [name] had  severe ischemic cardiomyopathy with left ventricular ejection fraction estimated to be approximately 30% by echocardiogram performed in October 2016. That echo describes septal and apical akinesis with global hypokinesis. He had evidence of restrictive filling as well as moderate right ventricular systolic dysfunction and moderate aortic insufficiency. Echo in May 2017 (after PCI and CRT) shows improved LVEF 45-50% and normal function of the mitral valve prosthesis. Right heart catheterization in May 2017 showed right atrial pressure 8, mean wedge pressure 6, cardiac index 2.2 L/minute.   He has an extensive history of coronary artery disease. He tells me that he thinks he has had 9 cardiac catheterizations and 5 stents. At least on one admission he had 2 episodes of cardiac arrest. I think this was during his hospitalization for bypass surgery/valve replacement. He underwent bypass surgery in [place] in 2015 with synchronous mitral valve replacement with a biological prosthesis. He has a history of previous stent placed in the LAD artery, type of stent and date of procedure unknown. He underwent placement of a bare metal stent to the ostium of the left circumflex coronary artery in September 2015. He subsequently underwent placement of 2 drug-eluting stents at [facility] in October 2016, placed in the right coronary artery and left circumflex coronary artery respectively. At the time of that cardiac catheterization he had 50% stenosis in the distal left main, 90% proximal LAD, 70% in-stent restenosis proximal LAD, patent LIMA to distal LAD, 80% RCA. No mention is made of his second bypass graft, which I presume is occluded. He is on combination aspirin/clopidogrel.  At the time of his bypass surgery received a 31 mm [employer] mitral bioprosthetic valve.  By echocardiogram last October 2016 the mean transvalvular gradient was 5 mmHg. The same echocardiogram described a trileaflet unrestricted  aortic valve with moderate insufficiency.   His chart describes a history of postoperative atrial fibrillation. He is not on anticoagulants. He takes low-dose amiodarone.  He has a history of left bundle branch block and he received a CRT-D [employer] device on 07/01/2015.  He has advanced chronic kidney disease. With recent baseline creatinine around 2.0. During his hospitalization in January 2017 the peak creatinine reached 4.6 (acute diarrheal illness). He has type 2 diabetes mellitus (not requiring insulin), complicated by kidney disease and neuropathy. . He takes atorvastatin ina relatively low dose. He has long-standing treated hypertension. He is a former smoker and has a history of COPD and takes bronchodilators. He has a history of migraine headaches treated with Fioricet.  Past Medical History  Diagnosis Date  . History of mitral valve replacement with bioprosthetic valve 09/05/2015    31 mm Edwards 2015, Dr. Jerelene Redden  . Chronic systolic CHF (congestive heart failure) ([place]) 09/05/2015    a. [place] 10/29/15: PCW 8, CI 2.2. Overall low filling presssures. Mean PA 20; b. echo 10/30/15: EF 45-50%, not tech suff to allwo for LV diastolic fxn, mild AI, nl appearing MVR  . CAD (coronary artery disease) 09/05/2015    CABG 2015 PCI-BMS ostial LCX Sept 2015 04/05/2015 cath 50% distal LM, 90% prox LAD, old stent prox LAD 70% ISR, patent LIMA-LAD, 80% prox RCA PCI-DES x2 RCA and LCX   . Biventricular ICD (implantable cardioverter-defibrillator) - [facility] 09/05/2015    Jul 01, 2015 [facility] Dynagen X4  . CKD stage 4 due to type 2 diabetes mellitus ([place]) 09/05/2015  . Essential hypertension 09/05/2015  . Diabetes mellitus, type 2 ([place]) 09/05/2015  . Postoperative atrial fibrillation ([place]) 09/05/2015    Past Surgical History  Procedure Laterality Date  . Coronary angioplasty    . Cardiac valve replacement  2015    39mm Edwards bioprosthesis  . Coronary artery bypass graft  2015    [facility], Dr. Jerelene Redden.  . Cardiac  catheterization N/A 10/29/2015    Procedure: Right Heart Cath;  Surgeon: Jolaine Artist, MD;  Location: [facility];  Service: Cardiovascular;  Laterality: N/A;    Current Medications: Outpatient Prescriptions Prior to Visit  Medication Sig Dispense Refill  . albuterol (PROVENTIL HFA;VENTOLIN HFA) 108 (90 Base) MCG/ACT inhaler Inhale 2 puffs into the lungs every 6 (six) hours as needed for wheezing or shortness of breath.    [name] amiodarone (PACERONE) 200 MG tablet Take 200 mg by mouth daily.    [name] aspirin 81 MG tablet Take 81 mg by mouth daily.    [name] atorvastatin (LIPITOR) 20 MG tablet Take 20 mg by mouth daily.    . butalbital-acetaminophen-caffeine (FIORICET WITH CODEINE) 50-325-40-30 MG capsule Take 1 capsule by mouth every 4 (four) hours as needed for headache.    . Cholecalciferol (VITAMIN D3) 2000 units TABS Take 1 tablet by mouth daily.    . clopidogrel (PLAVIX) 75 MG tablet Take 75 mg by mouth daily.    . DULoxetine (CYMBALTA) 60 MG capsule Take 60 mg by mouth 2 (two) times daily.     . fluticasone (FLONASE) 50 MCG/ACT nasal spray Place 1 spray into both nostrils daily.    [name] gabapentin (NEURONTIN) 100 MG capsule Take 100 mg by mouth 3 (three) times daily.    [name] glipiZIDE (GLUCOTROL) 2.5 mg TABS tablet Take 2.5 mg by mouth daily before breakfast.    .  glucose blood test strip 1 each by Other route as needed for other. Use as instructed    . HYDROcodone-acetaminophen (NORCO/VICODIN) 5-325 MG tablet Take 1 tablet by mouth every 6 (six) hours as needed for moderate pain.    . isosorbide mononitrate (IMDUR) 60 MG 24 hr tablet Take 60 mg by mouth daily.    . metFORMIN (GLUCOPHAGE) 1000 MG tablet Take 1,000 mg by mouth daily with breakfast.     . metolazone (ZAROXOLYN) 2.5 MG tablet Take 1 tablet (2.5 mg total) by mouth daily as needed. For weight gain greater than 4 pounds 30 tablet 5  . metoprolol succinate (TOPROL-XL) 25 MG 24 hr tablet Take 50 mg by mouth daily.     . nitroGLYCERIN  (NITROSTAT) 0.4 MG SL tablet Place 0.4 mg under the tongue every 5 (five) minutes as needed for chest pain.    . potassium chloride SA (K-DUR,KLOR-CON) 20 MEQ tablet Take 1 tablet (20 mEq total) by mouth 2 (two) times daily. Take 1 additional potassium tab when taking metolazone 60 tablet 5  . tiotropium (SPIRIVA HANDIHALER) 18 MCG inhalation capsule Place 1 capsule (18 mcg total) into inhaler and inhale daily. 30 capsule 11  . topiramate (TOPAMAX) 25 MG tablet Take 50 mg by mouth 2 (two) times daily.     [name] torsemide (DEMADEX) 20 MG tablet Take 2 tablets (40 mg total) by mouth 2 (two) times daily. 60 tablet 5   No facility-administered medications prior to visit.     Allergies:   Hydrochlorothiazide; Amoxicillin-pot clavulanate; Clindamycin; Lisinopril; Meloxicam; Topiramate; and Tamsulosin   Social History   Social History  . Marital Status: Married    Spouse Name: N/A  . Number of Children: N/A  . Years of Education: N/A   Social History Main Topics  . Smoking status: Current Some Day Smoker  . Smokeless tobacco: Never Used  . Alcohol Use: No  . Drug Use: No  . Sexual Activity: Not Currently   Other Topics Concern  . None   Social History Narrative     Family History:  The patient's family history includes Hyperlipidemia in his father; Hypertension in his father.   ROS:   Please see the history of present illness.    ROS All other systems reviewed and are negative.   PHYSICAL EXAM:   VS:  BP 127/71 mmHg  Pulse 64  Ht 6' (1.829 m)  Wt 124.286 kg (274 lb)  BMI 37.15 kg/m2  SpO2 98%   GEN: Well nourished, well developed, in no acute distress HEENT: normal Neck: no JVD, carotid bruits, or masses Cardiac: Paradoxically split second heart sound, RRR; no murmurs, rubs, or gallops, 1-2+ symmetrical ankle edema , healthy subclavian defibrillator site Respiratory: Reduced breath sounds globally, but otherwise clear to auscultation bilaterally, normal work of breathing GI:  soft, nontender, nondistended, + BS MS: no deformity or atrophy Skin: warm and dry, no rash Neuro:  Alert and Oriented x 3, Strength and sensation are intact Psych: euthymic mood, full affect  Wt Readings from Last 3 Encounters:  12/16/15 124.286 kg (274 lb)  11/05/15 125.737 kg (277 lb 3.2 oz)  10/31/15 121.156 kg (267 lb 1.6 oz)      Studies/Labs Reviewed:   EKG:  EKG is not ordered today.    Recent Labs: 10/29/2015: ALT 18; B Natriuretic Peptide 124.6*; Hemoglobin 11.5*; Platelets 203 10/31/2015: BUN 35*; Creatinine, Ser 2.51*; Potassium 3.3*; Sodium 140   Lipid Panel    Component Value Date/Time   CHOL  185 10/21/2015 0933   TRIG 237* 10/21/2015 0933   HDL 34* 10/21/2015 0933   CHOLHDL 5.4* 10/21/2015 0933   VLDL 47* 10/21/2015 0933   LDLCALC 104 10/21/2015 0933      ASSESSMENT:    No diagnosis found.   PLAN:  In order of problems listed above:  1. CHF: Class II symptoms. Target "dry weight" of 275 pounds on his home scale (he is actually lower than that by 4-5 lb). The right atrial pressure was higher than the left atrial, suggesting that he does have a significant component of right heart failure, probably due to both cor pulmonale (obesity, COPD, sleep apnea?) as well as long-standing left heart failure. Left ventricular systolic function is substantially improved from last year, probably due to the contribution of biventricular pacing and the revascularization procedure that he underwent in October 2016. Note that the PA pressure estimated by echo correlated very well with that measured at cardiac cath. 2. COPD: Currently dyspnea is primarily due to lung disease. While keeping the possibility of amiodarone related side effects in mind, the PFT pattern and the response to bronchodilator suggests obstructive lung disease is the bigger problem. Consider repeat PFTs in a year for amiodarone treatment monitoring 3. CAD: He received 2 drug-eluting stents in October 2016 and  should remain on dual antiplatelet therapy until October 2017 at least. If atrial fibrillation is documented will stop aspirin and start a true anticoagulant, probably warfarin in view of his volatile renal function. He has little in the way of angina pectoris. He remains in the window for possible restenosis, but is a poor candidate for repeat angiography due to his renal function. Left ventricular ejection fraction is improved compared to last year. 4. S/p MVR: 31 mm bioprosthesis with normal function by recent echocardiogram. Mitral gradient was lower on the echo performed recently been on the echo from [place]. Avoid tachycardia. 5. CRT-D: Normal function of his biventricular defibrillator. 100% biventricular pacing. No atrial or ventricular arrhythmia detected.  6. CKD stage 4: With recent episode of severe acute renal insufficiency. Spironolactone and ACE inhibitor were stopped. I don't believe it is safe to restart these. If blood pressure allows, consider hydralazine/nitrates. 7. HTN: Blood pressure is good today 8. DM: Appears to be well controlled on metformin. Hemoglobin A1c 6.2% in January 2017 9. HLP: His lipid profile is not bad, but ideally his LDL cholesterol should be less than 70. Will reevaluate before next appointment. 10. AFib: I have no details about the diagnosis. Obviously he is at very high embolic risk, but he is not on anticoagulants. It is possible that the decision was made to stop anticoagulants when he received his drug-eluting stents and was placed on aspirin and clopidogrel. It is also possible that the burden of atrial fibrillation was very low and was felt safer to keep him off anticoagulation. We'll take advantage of the fact that he has a rhythm device in place and will only initiate anticoagulation if he develops atrial fibrillation. His chest x-ray was clear on the recent hospital admission, without infiltrates due to either edema or pneumonia with suggestion of pulmonary  fibrosis. Continue amiodarone for now, since he seems to be at very high risk for arrhythmia recurrence but the risk/benefit of this agent should be periodically reviewed. Check liver function tests and TSH at least twice yearly.  11. Peripheral venous insufficiency: Despite only minimally elevated right atrial pressures at cardiac catheterization, he had prominent persistent leg edema. This is consistent with substantial  venous insufficiency. Keep legs elevated. Recommend compression stockings.   Medication Adjustments/Labs and Tests Ordered: Current medicines are reviewed at length with the patient today.  Concerns regarding medicines are outlined above.  Medication changes, Labs and Tests ordered today are listed in the Patient Instructions below. Patient Instructions  Dr Sallyanne Kuster recommends that you continue on your current medications as directed. Please refer to the Current Medication list given to you today.  Dr Sallyanne Kuster recommends that you schedule a follow-up appointment in 3 months. You will receive a reminder letter in the mail two months in advance. If you don't receive a letter, please call our office to schedule the follow-up appointment.  If you need a refill on your cardiac medications before your next appointment, please call your pharmacy.    Signed, [name], MD  12/16/2015 10:28 AM    [facility] [address], [place], [place]  [address] Phone: [phone]; Fax: [phone]

## 2015-12-16 NOTE — Patient Instructions (Signed)
Dr Sallyanne Kuster recommends that you continue on your current medications as directed. Please refer to the Current Medication list given to you today.  Dr Sallyanne Kuster recommends that you schedule a follow-up appointment in 3 months. You will receive a reminder letter in the mail two months in advance. If you don't receive a letter, please call our office to schedule the follow-up appointment.  If you need a refill on your cardiac medications before your next appointment, please call your pharmacy.

## 2015-12-24 ENCOUNTER — Other Ambulatory Visit: Payer: Self-pay | Admitting: Cardiovascular Disease

## 2015-12-24 MED ORDER — AMIODARONE HCL 200 MG PO TABS: 200.0000 mg | ORAL_TABLET | Freq: Every day | ORAL | Status: DC

## 2015-12-24 NOTE — Telephone Encounter (Signed)
Rx(s) sent to pharmacy electronically.  

## 2015-12-24 NOTE — Telephone Encounter (Signed)
°*  STAT* If patient is at the pharmacy, call can be transferred to refill team.   1. Which medications need to be refilled? (please list name of each medication and dose if known) Amiodarone  2. Which pharmacy/location (including street and city if local pharmacy) is medication to be sent to? [facility]  3. Do they need a 30 day or 90 day supply? 90  

## 2015-12-28 LAB — CUP PACEART INCLINIC DEVICE CHECK
Battery Remaining Longevity: 96 mo
HIGH POWER IMPEDANCE MEASURED VALUE: 70 Ohm
Lead Channel Pacing Threshold Amplitude: 0.7 V
Lead Channel Pacing Threshold Amplitude: 0.7 V
Lead Channel Pacing Threshold Pulse Width: 0.4 ms
Lead Channel Pacing Threshold Pulse Width: 0.4 ms
Lead Channel Setting Pacing Amplitude: 3.5 V
Lead Channel Setting Pacing Amplitude: 3.5 V
Lead Channel Setting Pacing Amplitude: 3.5 V
Lead Channel Setting Pacing Pulse Width: 0.4 ms
Lead Channel Setting Pacing Pulse Width: 0.4 ms
Lead Channel Setting Sensing Sensitivity: 1 mV
MDC IDC MSMT BATTERY REMAINING PERCENTAGE: 100 %
MDC IDC MSMT LEADCHNL LV IMPEDANCE VALUE: 602 Ohm
MDC IDC MSMT LEADCHNL LV PACING THRESHOLD AMPLITUDE: 1.5 V
MDC IDC MSMT LEADCHNL RA IMPEDANCE VALUE: 684 Ohm
MDC IDC MSMT LEADCHNL RV IMPEDANCE VALUE: 383 Ohm
MDC IDC MSMT LEADCHNL RV PACING THRESHOLD PULSEWIDTH: 0.4 ms
MDC IDC PG SERIAL: 160713
MDC IDC SESS DTM: 20170706135500
MDC IDC SET LEADCHNL RV SENSING SENSITIVITY: 0.6 mV
MDC IDC STAT BRADY RA PERCENT PACED: 18 %
MDC IDC STAT BRADY RV PERCENT PACED: 100 %

## 2016-01-03 ENCOUNTER — Telehealth: Payer: Self-pay

## 2016-01-03 DIAGNOSIS — E785 Hyperlipidemia, unspecified: Secondary | ICD-10-CM

## 2016-01-03 DIAGNOSIS — Z79899 Other long term (current) drug therapy: Secondary | ICD-10-CM

## 2016-01-03 NOTE — Telephone Encounter (Signed)
Dominic Klein, MD  Dionne Bucy Jencarlo Bonadonna, CMA        If I forgot, please order CMET and lipids before his next appointment  [facility]

## 2016-01-04 NOTE — Telephone Encounter (Signed)
Called patient, spoke with wife, Thayer Headings ([place]).  Advised her of Dr Lurline Del recommendations. Wife verbalized understanding.  Appointment made for 3 month follow-up with Dr C made for 03/14/2016 at 11:45a.

## 2016-01-06 ENCOUNTER — Telehealth: Payer: Self-pay | Admitting: Cardiovascular Disease

## 2016-01-06 NOTE — Telephone Encounter (Signed)
Instructed pt wife to send a manual transmission w/ home monitor b/c pt believes his ICD is beeping at him. Instructed pt wife that once we receive the transmission someone will review it and call her back. Pt wife verbalized understanding.

## 2016-01-06 NOTE — Telephone Encounter (Signed)
CRT-D remote received. Stable lead measurements. Normal device function. Presenting: AsBp. No alerts. No episodes recorded.  Informed patient/wife of device findings. I asked wife if patient had been around anything magnetic. Wife states that patient has not been around any magnets. I explained to wife that the device was functioning normally, and that she should search the house for other causes of the beeping.  Again, wife voiced understanding.

## 2016-01-10 ENCOUNTER — Telehealth: Payer: Self-pay

## 2016-01-10 NOTE — Telephone Encounter (Signed)
Request for surgical clearance:   1. What type surgery is being performed? Colonoscopy and EGD  2. When is this surgery scheduled? pending  3. Are there any medications that need to be held prior to surgery and how long? Plavix; 7 days prior to procedure  4. Name of the physician performing surgery: Dr Margaretmary Bayley  5. What is the office phone and fax number?  Phone [phone]  Fax [phone]

## 2016-01-11 ENCOUNTER — Encounter: Payer: Self-pay | Admitting: Cardiovascular Disease

## 2016-01-11 NOTE — Telephone Encounter (Signed)
Sent via epic 

## 2016-01-12 ENCOUNTER — Telehealth: Payer: Self-pay | Admitting: Cardiovascular Disease

## 2016-01-12 NOTE — Telephone Encounter (Signed)
Clearance faxed 01/12/16

## 2016-01-12 NOTE — Telephone Encounter (Signed)
Faxed clearance note:

## 2016-03-09 ENCOUNTER — Encounter: Payer: Self-pay | Admitting: Pulmonary Disease

## 2016-03-09 ENCOUNTER — Ambulatory Visit (INDEPENDENT_AMBULATORY_CARE_PROVIDER_SITE_OTHER): Payer: Medicare HMO | Admitting: Pulmonary Disease

## 2016-03-09 DIAGNOSIS — Z720 Tobacco use: Secondary | ICD-10-CM

## 2016-03-09 DIAGNOSIS — R911 Solitary pulmonary nodule: Secondary | ICD-10-CM | POA: Insufficient documentation

## 2016-03-09 DIAGNOSIS — J449 Chronic obstructive pulmonary disease, unspecified: Secondary | ICD-10-CM | POA: Diagnosis not present

## 2016-03-09 MED ORDER — UMECLIDINIUM-VILANTEROL 62.5-25 MCG/INH IN AEPB: 1.0000 | INHALATION_SPRAY | Freq: Every day | RESPIRATORY_TRACT | 0 refills | Status: DC

## 2016-03-09 NOTE — Patient Instructions (Signed)
Trial of ANORO once daily-  call for prescription if this works Use albuterol 2 puffs as needed for rescue  CT chest-no contrast will be scheduled to follow up on nodules noted on your CT abdomen

## 2016-03-09 NOTE — Addendum Note (Signed)
Addended by: Mathis Dad on: 03/09/2016 04:38 PM   Modules accepted: Orders

## 2016-03-09 NOTE — Assessment & Plan Note (Signed)
Trial of ANORO once daily-  call for prescription if this works Use albuterol 2 puffs as needed for rescue

## 2016-03-09 NOTE — Assessment & Plan Note (Signed)
Smoking cessation was emphasized 

## 2016-03-09 NOTE — Progress Notes (Signed)
   Subjective:    Patient ID: Dominic [name], male    DOB: 20-Apr-1951, 65 y.o.   MRN: [id number]  HPI    Review of Systems  Constitutional: Negative for activity change, appetite change, chills, fever and unexpected weight change.  HENT: Negative for congestion, dental problem, postnasal drip, rhinorrhea, sneezing, sore throat, trouble swallowing and voice change.   Eyes: Negative for visual disturbance.  Respiratory: Positive for chest tightness and shortness of breath. Negative for cough and choking.   Cardiovascular: Negative for chest pain and leg swelling.  Gastrointestinal: Negative for abdominal pain, nausea and vomiting.  Genitourinary: Negative for difficulty urinating.  Musculoskeletal: Negative for arthralgias.  Skin: Negative for rash.  Psychiatric/Behavioral: Negative for behavioral problems and confusion.       Objective:   Physical Exam        Assessment & Plan:

## 2016-03-09 NOTE — Assessment & Plan Note (Signed)
CT chest-no contrast will be scheduled

## 2016-03-09 NOTE — Progress Notes (Signed)
Subjective:    Patient ID: Dominic [name], male    DOB: 1951/03/21, 65 y.o.   MRN: [id number]  HPI  Chief Complaint  Patient presents with  . pulmonary Consult    Unsure who referred patient, patient has several health issues, states that doctors wanted him to be seen to evaluate lungs. was given Incruse in the hospital, but done with sample.  Still taking Spiriva as well.  Says Incruse worked better. SOB    65 year old smoker referred for evaluation of COPD by cardiology. He has chronic systolic heart failure status post biventricular pacer , atrial fibrillation and CK D. He smoked one half pack per day for 25 years.  He reports dyspnea grade 2-3 for many years He was started on Spiriva in the past but this did not improve his dyspnea . He does not correlate his smoking with his dyspnea. He reports occasional wheezing but denies recurrent chest colds or pneumonia . He reports an occasional dry cough.  He was hospitalized at [facility] about a week ago for abdominal pain, nausea and vomiting-he was told that he had kidney failure which improved with hydration. I have reviewed the records including imaging reports. 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 Right upper quadrant ultrasound was normal and ultrasound of the kidneys did not show hydronephrosis. He was discharged on Incruse and he states that this helped him more than the Spiriva   Significant tests/ events  Echo 10/2015 showed EF 45% and no evidence of pulmonary hypertension  11/2015 PFTs showed moderate reversible airway obstruction FEV1 58% and improved 36% from 2.09-2.85 with bronchodilator FVC 76% improved to 89%, TLC was normal and DLCO was 37%  Past Medical History:  Diagnosis Date  . Biventricular ICD (implantable cardioverter-defibrillator) - [facility] 09/05/2015   Jul 01, 2015 [facility] Dynagen X4  . CAD  (coronary artery disease) 09/05/2015   CABG 2015 PCI-BMS ostial LCX Sept 2015 04/05/2015 cath 50% distal LM, 90% prox LAD, old stent prox LAD 70% ISR, patent LIMA-LAD, 80% prox RCA PCI-DES x2 RCA and LCX   . Chronic systolic CHF (congestive heart failure) ([place]) 09/05/2015   a. [place] 10/29/15: PCW 8, CI 2.2. Overall low filling presssures. Mean PA 20; b. echo 10/30/15: EF 45-50%, not tech suff to allwo for LV diastolic fxn, mild AI, nl appearing MVR  . CKD stage 4 due to type 2 diabetes mellitus ([place]) 09/05/2015  . Diabetes mellitus, type 2 ([place]) 09/05/2015  . Essential hypertension 09/05/2015  . History of mitral valve replacement with bioprosthetic valve 09/05/2015   31 mm Edwards 2015, Dr. Jerelene Redden  . Postoperative atrial fibrillation ([place]) 09/05/2015     Past Surgical History:  Procedure Laterality Date  . CARDIAC CATHETERIZATION N/A 10/29/2015   Procedure: Right Heart Cath;  Surgeon: Jolaine Artist, MD;  Location: [place] [facility];  Service: Cardiovascular;  Laterality: N/A;  . CARDIAC VALVE REPLACEMENT  2015   21mm Edwards bioprosthesis  . CORONARY ANGIOPLASTY    . CORONARY ARTERY BYPASS GRAFT  2015   [facility], Dr. Jerelene Redden.     Allergies  Allergen Reactions  . Hydrochlorothiazide Itching and Other (See Comments)    Unknown. Hyponatremia  . Amoxicillin-Pot Clavulanate Nausea And Vomiting, Other (See Comments) and Nausea Only    unknown Doesn't know  . Clindamycin Nausea And Vomiting, Nausea Only and Other (See Comments)    Chest pain  . Lisinopril  Other (See Comments) and Cough    Unknown. Unknown. hyponatremia Unknown. Unknown.  . Meloxicam Other (See Comments), Itching and Nausea Only    unknown Unknown.  . Topiramate Other (See Comments)  . Tamsulosin Other (See Comments)    dysuria     Social History   Social History  . Marital status: Married    Spouse name: N/A  . Number of children: N/A  . Years of education: N/A   Occupational History  . Not on file.   Social  History Main Topics  . Smoking status: Current Some Day Smoker    Packs/day: 0.50    Years: 40.00  . Smokeless tobacco: Never Used  . Alcohol use No  . Drug use: No  . Sexual activity: Not Currently   Other Topics Concern  . Not on file   Social History Narrative  . No narrative on file     Family History  Problem Relation Age of Onset  . Hypertension Father   . Hyperlipidemia Father      Review of Systems neg for any significant sore throat, dysphagia, itching, sneezing, nasal congestion or excess/ purulent secretions, fever, chills, sweats, unintended wt loss, pleuritic or exertional cp, hempoptysis, orthopnea pnd or change in chronic leg swelling.   Also denies presyncope, palpitations, heartburn, abdominal pain, nausea, vomiting, diarrhea or change in bowel or urinary habits, dysuria,hematuria, rash, arthralgias, visual complaints, headache, numbness weakness or ataxia.     Objective:   Physical Exam  Gen. Pleasant, obese, in no distress, normal affect ENT - no lesions, no post nasal drip, class 2-3 airway Neck: No JVD, no thyromegaly, no carotid bruits Lungs: no use of accessory muscles, no dullness to percussion, decreased without rales or rhonchi  Cardiovascular: Rhythm regular, heart sounds  normal, no murmurs or gallops, 1+ peripheral edema L >> R Abdomen: soft and non-tender, no hepatosplenomegaly, BS normal. Musculoskeletal: No deformities, no cyanosis or clubbing Neuro:  alert, non focal, no tremors       Assessment & Plan:

## 2016-03-10 LAB — COMPREHENSIVE METABOLIC PANEL
ALBUMIN: 3.7 g/dL (ref 3.6–5.1)
ALK PHOS: 105 U/L (ref 40–115)
ALT: 17 U/L (ref 9–46)
AST: 17 U/L (ref 10–35)
BUN: 33 mg/dL — ABNORMAL HIGH (ref 7–25)
CHLORIDE: 109 mmol/L (ref 98–110)
CO2: 22 mmol/L (ref 20–31)
Calcium: 9.4 mg/dL (ref 8.6–10.3)
Creat: 2.75 mg/dL — ABNORMAL HIGH (ref 0.70–1.25)
Glucose, Bld: 69 mg/dL (ref 65–99)
POTASSIUM: 3.9 mmol/L (ref 3.5–5.3)
Sodium: 141 mmol/L (ref 135–146)
TOTAL PROTEIN: 6.7 g/dL (ref 6.1–8.1)
Total Bilirubin: 0.3 mg/dL (ref 0.2–1.2)

## 2016-03-10 LAB — LIPID PANEL
CHOLESTEROL: 108 mg/dL — AB (ref 125–200)
HDL: 28 mg/dL — AB (ref >=40)
LDL Cholesterol: 34 mg/dL (ref <130)
TRIGLYCERIDES: 228 mg/dL — AB (ref <150)
Total CHOL/HDL Ratio: 3.9 Ratio (ref <=5.0)
VLDL: 46 mg/dL — ABNORMAL HIGH (ref <30)

## 2016-03-14 ENCOUNTER — Encounter: Payer: Self-pay | Admitting: Cardiovascular Disease

## 2016-03-14 ENCOUNTER — Ambulatory Visit (INDEPENDENT_AMBULATORY_CARE_PROVIDER_SITE_OTHER): Payer: Medicare HMO | Admitting: Cardiovascular Disease

## 2016-03-14 ENCOUNTER — Encounter: Payer: Medicare HMO | Admitting: Cardiovascular Disease

## 2016-03-14 VITALS — BP 109/67 | HR 65 | Ht 72.0 in | Wt 258.2 lb

## 2016-03-14 DIAGNOSIS — Z953 Presence of xenogenic heart valve: Secondary | ICD-10-CM

## 2016-03-14 DIAGNOSIS — E1169 Type 2 diabetes mellitus with other specified complication: Secondary | ICD-10-CM

## 2016-03-14 DIAGNOSIS — I9789 Other postprocedural complications and disorders of the circulatory system, not elsewhere classified: Secondary | ICD-10-CM

## 2016-03-14 DIAGNOSIS — E669 Obesity, unspecified: Secondary | ICD-10-CM

## 2016-03-14 DIAGNOSIS — Z01810 Encounter for preprocedural cardiovascular examination: Secondary | ICD-10-CM

## 2016-03-14 DIAGNOSIS — Z95810 Presence of automatic (implantable) cardiac defibrillator: Secondary | ICD-10-CM | POA: Diagnosis not present

## 2016-03-14 DIAGNOSIS — I25118 Atherosclerotic heart disease of native coronary artery with other forms of angina pectoris: Secondary | ICD-10-CM

## 2016-03-14 DIAGNOSIS — N184 Chronic kidney disease, stage 4 (severe): Secondary | ICD-10-CM

## 2016-03-14 DIAGNOSIS — I5022 Chronic systolic (congestive) heart failure: Secondary | ICD-10-CM

## 2016-03-14 DIAGNOSIS — E1122 Type 2 diabetes mellitus with diabetic chronic kidney disease: Secondary | ICD-10-CM

## 2016-03-14 DIAGNOSIS — I4891 Unspecified atrial fibrillation: Secondary | ICD-10-CM

## 2016-03-14 DIAGNOSIS — I10 Essential (primary) hypertension: Secondary | ICD-10-CM

## 2016-03-14 DIAGNOSIS — J449 Chronic obstructive pulmonary disease, unspecified: Secondary | ICD-10-CM | POA: Diagnosis not present

## 2016-03-14 LAB — CUP PACEART INCLINIC DEVICE CHECK
Battery Remaining Longevity: 90 mo
Battery Remaining Percentage: 100 %
Date Time Interrogation Session: 20171003122000
HIGH POWER IMPEDANCE MEASURED VALUE: 82 Ohm
Lead Channel Impedance Value: 705 Ohm
Lead Channel Pacing Threshold Amplitude: 0.7 V
Lead Channel Pacing Threshold Amplitude: 1.5 V
Lead Channel Pacing Threshold Pulse Width: 0.4 ms
Lead Channel Pacing Threshold Pulse Width: 0.4 ms
Lead Channel Pacing Threshold Pulse Width: 0.4 ms
Lead Channel Setting Pacing Amplitude: 3.5 V
Lead Channel Setting Sensing Sensitivity: 0.6 mV
MDC IDC MSMT LEADCHNL LV IMPEDANCE VALUE: 613 Ohm
MDC IDC MSMT LEADCHNL RV IMPEDANCE VALUE: 371 Ohm
MDC IDC MSMT LEADCHNL RV PACING THRESHOLD AMPLITUDE: 0.7 V
MDC IDC PG SERIAL: 160713
MDC IDC SET LEADCHNL LV PACING AMPLITUDE: 3.5 V
MDC IDC SET LEADCHNL LV PACING PULSEWIDTH: 0.4 ms
MDC IDC SET LEADCHNL LV SENSING SENSITIVITY: 1 mV
MDC IDC SET LEADCHNL RV PACING AMPLITUDE: 3.5 V
MDC IDC SET LEADCHNL RV PACING PULSEWIDTH: 0.4 ms
MDC IDC STAT BRADY RA PERCENT PACED: 14 %
MDC IDC STAT BRADY RV PERCENT PACED: 100 %

## 2016-03-14 MED ORDER — TORSEMIDE 20 MG PO TABS: ORAL_TABLET | ORAL | 11 refills | Status: DC

## 2016-03-14 MED ORDER — ATORVASTATIN CALCIUM 10 MG PO TABS: 10.0000 mg | ORAL_TABLET | Freq: Every day | ORAL | 11 refills | Status: DC

## 2016-03-14 NOTE — Progress Notes (Signed)
Cardiology Office Note    Date:  03/14/2016   ID:  Dominic [name], DOB [birth year], MRN [id number]  PCP:  Sharmon Leyden, MD  Cardiologist:   [name], MD   Chief Complaint  Patient presents with  . Follow-up    History of Present Illness:  Dominic [name] is a 66 y.o. 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  He was hospitalized in [facility] with acute kidney injury related to hypovolemia from nausea, vomiting and poor by mouth intake. On admission his creatinine was 4.6 and he received fluids. Labs performed just 4 days ago show creatinine of 2.75 (baseline is around 2.5).   He was recently diagnosed with sigmoid colon cancer. Noncontrast CT of the abdomen and pelvis did not show evidence of metastatic disease, but the comment was made that he will require MRI to exclude liver metastases.   He has had problems with recurrent nausea and vomiting and poor by mouth intake and has lost substantial weight. He had previously estimated his "dry weight" to be 275 pounds on our office scale ((his home scale shows roughly 5 pounds less than our office scale). Today he weighs 17 pounds less than that. He was even worse, 8 pounds lighter than this when he was in acute renal failure and required hospitalization in [facility]. He has reduced his torsemide dose by himself, based on how much he is able to take in.   He reports that metformin has been discontinued. His recent lipid profile shows an excellent reduction in LDL cholesterol, maybe even slightly excessive. He continues to take amiodarone and remains in normal rhythm. His liver function tests are normal.  He was doing  poorly with aggressive diuretic therapy for heart failure and right heart catheterization performed earlier this year demonstrated that he actually had low left ventricular filling pressures. Pulmonary function tests confirm that he has advanced chronic lung disease which probably explains much of his chronic dyspnea. He has been started on combination bronchodilator therapy and has had substantial improvement in his symptoms.  He continues to have NYHA functional class II dyspnea . He continues to have leg edema which is likely attributable to right heart failure and peripheral venous insufficiency.   Interrogation of his CRT-D device shows 100% biventricular pacing, increased levels of activity, and improved heart rate histogram likely as a consequence of more activity. There has been no atrial fibrillation and no ventricular tachycardia. He has 14% atrial pacing. Battery longevity is estimated at about 7.5 years.  Dominic [name] had severe ischemic cardiomyopathy with left ventricular ejection fraction estimated to be approximately 30% by echocardiogram performed in October 2016. That echo describes septal and apical akinesis with global hypokinesis. He had evidence of restrictive filling as well as moderate right ventricular systolic dysfunction and moderate aortic insufficiency. Echo in May 2017 (after PCI and CRT) shows improved LVEF 45-50% and normal function of the mitral valve prosthesis. Right heart catheterization in May 2017 showed right atrial pressure 8, mean wedge pressure 6, cardiac index 2.2 L/minute.   He has an extensive history of coronary artery disease. He tells me that he thinks he has had 9 cardiac catheterizations and 5 stents. At least on one admission he had 2 episodes of cardiac  arrest. I think this was during his hospitalization for bypass surgery/valve replacement. He underwent bypass surgery in [place] in 2015 with synchronous mitral valve replacement with a biological  prosthesis. He has a history of previous stent placed in the LAD artery, type of stent and date of procedure unknown. He underwent placement of a bare metal stent to the ostium of the left circumflex coronary artery in September 2015. He subsequently underwent placement of 2 drug-eluting stents at [facility] in October 2016, placed in the right coronary artery and left circumflex coronary artery respectively. At the time of that cardiac catheterization he had 50% stenosis in the distal left main, 90% proximal LAD, 70% in-stent restenosis proximal LAD, patent LIMA to distal LAD, 80% RCA. No mention is made of his second bypass graft, which I presume is occluded. He is on combination aspirin/clopidogrel.  At the time of his bypass surgery received a 31 mm [employer] mitral bioprosthetic valve. By echocardiogram last October 2016 the mean transvalvular gradient was 5 mmHg. The same echocardiogram described a trileaflet unrestricted aortic valve with moderate insufficiency.   His chart describes a history of postoperative atrial fibrillation. He is not on anticoagulants. He takes low-dose amiodarone.  He has a history of left bundle branch block and he received a CRT-D [employer] device on 07/01/2015. The device and the leads are MRI conditional.  He has advanced chronic kidney disease. With recent baseline creatinine around 2.0-2.5. During his hospitalization in January 2017 the peak creatinine reached 4.6 (acute diarrheal illness), reaching a similar level in August when he had nausea and vomiting.[name] He has type 2 diabetes mellitus (not requiring insulin), complicated by kidney disease and neuropathy. . He takes atorvastatin in a relatively low dose. He has long-standing treated hypertension. He is a former smoker and has a history of COPD and takes bronchodilators. He has a history of migraine headaches treated with Fioricet.  Past Medical History:  Diagnosis Date  . Biventricular  ICD (implantable cardioverter-defibrillator) - [facility] 09/05/2015   Jul 01, 2015 [facility] Dynagen X4  . CAD (coronary artery disease) 09/05/2015   CABG 2015 PCI-BMS ostial LCX Sept 2015 04/05/2015 cath 50% distal LM, 90% prox LAD, old stent prox LAD 70% ISR, patent LIMA-LAD, 80% prox RCA PCI-DES x2 RCA and LCX   . Chronic systolic CHF (congestive heart failure) ([place]) 09/05/2015   a. [place] 10/29/15: PCW 8, CI 2.2. Overall low filling presssures. Mean PA 20; b. echo 10/30/15: EF 45-50%, not tech suff to allwo for LV diastolic fxn, mild AI, nl appearing MVR  . CKD stage 4 due to type 2 diabetes mellitus ([place]) 09/05/2015  . Diabetes mellitus, type 2 ([place]) 09/05/2015  . Essential hypertension 09/05/2015  . History of mitral valve replacement with bioprosthetic valve 09/05/2015   31 mm Edwards 2015, Dr. Jerelene Redden  . Postoperative atrial fibrillation ([place]) 09/05/2015    Past Surgical History:  Procedure Laterality Date  . CARDIAC CATHETERIZATION N/A 10/29/2015   Procedure: Right Heart Cath;  Surgeon: Jolaine Artist, MD;  Location: [place] [facility];  Service: Cardiovascular;  Laterality: N/A;  . CARDIAC VALVE REPLACEMENT  2015   25mm Edwards bioprosthesis  . CORONARY ANGIOPLASTY    . CORONARY ARTERY BYPASS GRAFT  2015   [facility], Dr. Jerelene Redden.    Current Medications: Outpatient Medications Prior to Visit  Medication Sig Dispense Refill  . albuterol (PROVENTIL HFA;VENTOLIN HFA) 108 (90 Base) MCG/ACT inhaler Inhale 2 puffs into the lungs every 6 (six) hours as  needed for wheezing or shortness of breath.    [name] amiodarone (PACERONE) 200 MG tablet Take 1 tablet (200 mg total) by mouth daily. 90 tablet 3  . aspirin 81 MG tablet Take 81 mg by mouth daily.    . butalbital-acetaminophen-caffeine (FIORICET WITH CODEINE) 50-325-40-30 MG capsule Take 1 capsule by mouth every 4 (four) hours as needed for headache.    . Cholecalciferol (VITAMIN D3) 2000 units TABS Take 1 tablet by mouth daily.    . clopidogrel (PLAVIX) 75 MG tablet  Take 75 mg by mouth daily.    . DULoxetine (CYMBALTA) 60 MG capsule Take 60 mg by mouth 2 (two) times daily.     . fluticasone (FLONASE) 50 MCG/ACT nasal spray Place 1 spray into both nostrils daily.    [name] gabapentin (NEURONTIN) 100 MG capsule Take 100 mg by mouth 3 (three) times daily.    [name] glipiZIDE (GLUCOTROL) 2.5 mg TABS tablet Take 2.5 mg by mouth daily before breakfast.    . glucose blood test strip 1 each by Other route as needed for other. Use as instructed    . HYDROcodone-acetaminophen (NORCO/VICODIN) 5-325 MG tablet Take 1 tablet by mouth every 6 (six) hours as needed for moderate pain.    . isosorbide mononitrate (IMDUR) 60 MG 24 hr tablet Take 60 mg by mouth daily.    . metolazone (ZAROXOLYN) 2.5 MG tablet Take 1 tablet (2.5 mg total) by mouth daily as needed. For weight gain greater than 4 pounds 30 tablet 5  . metoprolol succinate (TOPROL-XL) 25 MG 24 hr tablet Take 50 mg by mouth daily.     . nitroGLYCERIN (NITROSTAT) 0.4 MG SL tablet Place 0.4 mg under the tongue every 5 (five) minutes as needed for chest pain.    . potassium chloride SA (K-DUR,KLOR-CON) 20 MEQ tablet Take 1 tablet (20 mEq total) by mouth 2 (two) times daily. Take 1 additional potassium tab when taking metolazone 60 tablet 5  . tiotropium (SPIRIVA HANDIHALER) 18 MCG inhalation capsule Place 1 capsule (18 mcg total) into inhaler and inhale daily. 30 capsule 11  . topiramate (TOPAMAX) 25 MG tablet Take 50 mg by mouth 2 (two) times daily.     [name] umeclidinium-vilanterol (ANORO ELLIPTA) 62.5-25 MCG/INH AEPB Inhale 1 puff into the lungs daily. 1 each 0  . atorvastatin (LIPITOR) 20 MG tablet Take 20 mg by mouth daily.    . metFORMIN (GLUCOPHAGE) 1000 MG tablet Take 1,000 mg by mouth daily with breakfast.     . torsemide (DEMADEX) 20 MG tablet Take 2 tablets (40 mg total) by mouth 2 (two) times daily. 60 tablet 5   No facility-administered medications prior to visit.      Allergies:   Hydrochlorothiazide; Amoxicillin-pot  clavulanate; Clindamycin; Lisinopril; Meloxicam; Topiramate; and Tamsulosin   Social History   Social History  . Marital status: Married    Spouse name: N/A  . Number of children: N/A  . Years of education: N/A   Social History Main Topics  . Smoking status: Current Some Day Smoker    Packs/day: 0.50    Years: 40.00  . Smokeless tobacco: Never Used  . Alcohol use No  . Drug use: No  . Sexual activity: Not Currently   Other Topics Concern  . None   Social History Narrative  . None     Family History:  The patient's family history includes Hyperlipidemia in his father; Hypertension in his father.   ROS:   Please see the history of present  illness.    ROS All other systems reviewed and are negative.   PHYSICAL EXAM:   VS:  BP 109/67   Pulse 65   Ht 6' (1.829 m)   Wt 117.1 kg (258 lb 3.2 oz)   SpO2 97%   BMI 35.02 kg/m    GEN: Well nourished, well developed, in no acute distress  HEENT: normal  Neck: no JVD, carotid bruits, or masses Cardiac: Paradoxically split second heart sound, RRR; no murmurs, rubs, or gallops, 1-2+ symmetrical ankle edema , healthy subclavian defibrillator site Respiratory: Reduced breath sounds globally, but otherwise clear to auscultation bilaterally, normal work of breathing GI: soft, nontender, nondistended, + BS MS: no deformity or atrophy  Skin: warm and dry, no rash Neuro:  Alert and Oriented x 3, Strength and sensation are intact Psych: euthymic mood, full affect  Wt Readings from Last 3 Encounters:  03/14/16 117.1 kg (258 lb 3.2 oz)  03/09/16 116.6 kg (257 lb)  12/16/15 124.3 kg (274 lb)      Studies/Labs Reviewed:   EKG:  EKG is not ordered today.    Recent Labs: 10/29/2015: B Natriuretic Peptide 124.6; Hemoglobin 11.5; Platelets 203 03/10/2016: ALT 17; BUN 33; Creat 2.75; Potassium 3.9; Sodium 141   Lipid Panel    Component Value Date/Time   CHOL 108 (L) 03/10/2016 0925   TRIG 228 (H) 03/10/2016 0925   HDL 28 (L)  03/10/2016 0925   CHOLHDL 3.9 03/10/2016 0925   VLDL 46 (H) 03/10/2016 0925   LDLCALC 34 03/10/2016 0925      ASSESSMENT:    1. Chronic systolic heart failure ([place])   2. Biventricular ICD (implantable cardioverter-defibrillator) in place   3. Chronic obstructive pulmonary disease, unspecified COPD type ([place])   4. Coronary artery disease involving native coronary artery of native heart with other form of angina pectoris ([place])   5. History of mitral valve replacement with bioprosthetic valve   6. CKD stage 4 due to type 2 diabetes mellitus ([place])   7. Essential hypertension   8. Diabetes mellitus type 2 in obese ([place])   9. Postoperative atrial fibrillation ([facility])   10. Preoperative cardiovascular examination      PLAN:  In order of problems listed above:  1. CHF: Class II symptoms. He appears to be hypovolemic today, although his blood pressure is low normal Target "dry weight" has to be readjusted for what is likely true weight loss, but even so he is probably dry today. Have him hold his diuretics unless she weighs 260 pounds or higher on his home scale and take half of the previous dose of torsemide ounces 40 mg once daily. He does not need to take metolazone at this time. The right atrial pressure was higher than the left atrial, suggesting that he does have a significant component of right heart failure, probably due to both cor pulmonale (obesity, COPD, sleep apnea?) as well as long-standing left heart failure. Left ventricular systolic function is substantially improved from last year, probably due to the contribution of biventricular pacing and the revascularization procedure that he underwent in October 2016. Note that the PA pressure estimated by echo correlated very well with that measured at cardiac cath. 2. COPD: Currently dyspnea is primarily due to lung disease. Think this is a bigger concern with his upcoming colon surgery than his cardiac condition. While keeping the  possibility of amiodarone related side effects in mind, the PFT pattern and the response to bronchodilator suggests obstructive lung disease is the bigger  problem. Repeat PFTs in a year for amiodarone treatment monitoring 3. CAD: He received 2 drug-eluting stents in October 2016 and he can now safely interrupt dual antiplatelet therap. He remains in the window for possible restenosis, he does not have angina pectoris and is a poor candidate for repeat angiography due to his renal function. Left ventricular ejection fraction is improved compared to last year. 4. S/p MVR: 31 mm bioprosthesis with normal function by recent echocardiogram. Mitral gradient was lower on the echo performed recently compared to the echo from [place]. Avoid tachycardia. 5. CRT-D: Normal function of his biventricular defibrillator. 100% biventricular pacing. No atrial or ventricular arrhythmia detected. His device is MRI conditional. Reprogramming has to be performed before MRI scans, but most MRI sequences can be performed if these are necessary to exclude liver metastases. 6. CKD stage 4: With recent episode of severe acute renal insufficiency, and time this year. Spironolactone and ACE inhibitor were stopped early in the year and clearly it is not safe to restart these. 7. HTN: Blood pressure is good today 8. DM: Appears to be well controlled. Renal dysfunction led to discontinuation of  metformin. Hemoglobin A1c 6.2% in January 2017 9. HLP: His lipid profile is excellent, and we will cut back his atorvastatin to 10 mg daily  10. AFib: I leave this was just a postoperative event and for this reason he was not started on anticoagulation. None has been detected since implantation of his defibrillator in January.. Continue amiodarone for now, since he seems to be at very high risk for arrhythmia recurrence. Check liver function tests and TSH at least twice yearly.  11. Peripheral venous insufficiency: Despite only minimally elevated  right atrial pressures at cardiac catheterization, he had prominent persistent leg edema. This is consistent with substantial venous insufficiency. Keep legs elevated. Recommend compression stockings.  Overall I believe that his perioperative risk of major cardiovascular events is moderate due to the richness of his chronic medical problems. However all his cardiac complaints are currently well compensated and further evaluation or treatment is not necessary. If anything he appears to be on the "dry side".   Medication Adjustments/Labs and Tests Ordered: Current medicines are reviewed at length with the patient today.  Concerns regarding medicines are outlined above.  Medication changes, Labs and Tests ordered today are listed in the Patient Instructions below. Patient Instructions  Dr Sallyanne Kuster has recommended making the following medication changes: 1. DECREASE Atorvastatin to 10 mg 2. DECREASE Torsemide to 40 mg ONCE daily - take only if weight is over 260 pounds  Dr C recommends that you schedule a follow-up appointment in 1 month.  If you need a refill on your cardiac medications before your next appointment, please call your pharmacy.    Signed, [name], MD  03/14/2016 9:15 AM    [address], [place], [place]  [address] Phone: [phone]; Fax: [phone]

## 2016-03-14 NOTE — Patient Instructions (Signed)
Dr Sallyanne Kuster has recommended making the following medication changes: 1. DECREASE Atorvastatin to 10 mg 2. DECREASE Torsemide to 40 mg ONCE daily - take only if weight is over 260 pounds  Dr C recommends that you schedule a follow-up appointment in 1 month.  If you need a refill on your cardiac medications before your next appointment, please call your pharmacy.

## 2016-03-17 ENCOUNTER — Ambulatory Visit (HOSPITAL_BASED_OUTPATIENT_CLINIC_OR_DEPARTMENT_OTHER): Payer: Medicare HMO

## 2016-03-17 ENCOUNTER — Telehealth: Payer: Self-pay | Admitting: Pulmonary Disease

## 2016-03-17 MED ORDER — UMECLIDINIUM-VILANTEROL 62.5-25 MCG/INH IN AEPB: 1.0000 | INHALATION_SPRAY | Freq: Every day | RESPIRATORY_TRACT | 5 refills | Status: DC

## 2016-03-17 NOTE — Telephone Encounter (Signed)
Instructions      Return in about 2 months (around 05/09/2016) for TP.  Trial of ANORO once daily-  call for prescription if this works Use albuterol 2 puffs as needed for rescue  CT chest-no contrast will be scheduled to follow up on nodules noted on your CT abdomen   Pt's wife is aware that I have sent Rx to [facility]. Nothing more needed at this time.

## 2016-03-17 NOTE — Telephone Encounter (Signed)
pts wife calling and stating that they need a refill of the anoro sent to [facility] [address] in [place].  Stated that the anoro is helping a great deal.  thanks

## 2016-03-20 ENCOUNTER — Encounter: Payer: Medicare HMO | Admitting: Cardiovascular Disease

## 2016-04-03 ENCOUNTER — Telehealth (HOSPITAL_COMMUNITY): Payer: Self-pay | Admitting: Cardiovascular Disease

## 2016-04-03 NOTE — Telephone Encounter (Signed)
Cannot find documentation for a call. Routed to primary.

## 2016-04-03 NOTE — Telephone Encounter (Signed)
New message    Patient calling back stating someone call the home today did not leave a name or reason for call.

## 2016-04-04 NOTE — Telephone Encounter (Signed)
Not sure who called. Called and notified patient's wife, Thayer Headings.

## 2016-04-18 ENCOUNTER — Encounter: Payer: Medicare HMO | Admitting: Cardiovascular Disease

## 2016-04-27 ENCOUNTER — Ambulatory Visit: Payer: Medicare HMO | Admitting: Adult Health

## 2016-05-08 ENCOUNTER — Encounter: Payer: Self-pay | Admitting: *Deleted

## 2016-05-09 ENCOUNTER — Encounter: Payer: Self-pay | Admitting: Cardiovascular Disease

## 2016-05-09 ENCOUNTER — Ambulatory Visit (INDEPENDENT_AMBULATORY_CARE_PROVIDER_SITE_OTHER): Payer: Medicare HMO | Admitting: Cardiovascular Disease

## 2016-05-09 VITALS — BP 120/64 | HR 62 | Ht 72.0 in | Wt 244.0 lb

## 2016-05-09 DIAGNOSIS — I25118 Atherosclerotic heart disease of native coronary artery with other forms of angina pectoris: Secondary | ICD-10-CM

## 2016-05-09 DIAGNOSIS — I9789 Other postprocedural complications and disorders of the circulatory system, not elsewhere classified: Secondary | ICD-10-CM

## 2016-05-09 DIAGNOSIS — I5043 Acute on chronic combined systolic (congestive) and diastolic (congestive) heart failure: Secondary | ICD-10-CM | POA: Diagnosis not present

## 2016-05-09 DIAGNOSIS — Z95810 Presence of automatic (implantable) cardiac defibrillator: Secondary | ICD-10-CM

## 2016-05-09 DIAGNOSIS — Z953 Presence of xenogenic heart valve: Secondary | ICD-10-CM

## 2016-05-09 DIAGNOSIS — J449 Chronic obstructive pulmonary disease, unspecified: Secondary | ICD-10-CM | POA: Diagnosis not present

## 2016-05-09 DIAGNOSIS — N184 Chronic kidney disease, stage 4 (severe): Secondary | ICD-10-CM

## 2016-05-09 DIAGNOSIS — E1122 Type 2 diabetes mellitus with diabetic chronic kidney disease: Secondary | ICD-10-CM

## 2016-05-09 DIAGNOSIS — I4891 Unspecified atrial fibrillation: Secondary | ICD-10-CM

## 2016-05-09 DIAGNOSIS — E782 Mixed hyperlipidemia: Secondary | ICD-10-CM

## 2016-05-09 DIAGNOSIS — I10 Essential (primary) hypertension: Secondary | ICD-10-CM

## 2016-05-09 DIAGNOSIS — I872 Venous insufficiency (chronic) (peripheral): Secondary | ICD-10-CM

## 2016-05-09 NOTE — Patient Instructions (Signed)
Dr Sallyanne Kuster has recommended making the following medication changes: 1. STOP Metolazone  Your physician recommends that you return for lab work in 2 weeks.  Dr Sallyanne Kuster recommends that you schedule a follow-up appointment in 3 months with a defibrillator check.  If you need a refill on your cardiac medications before your next appointment, please call your pharmacy.

## 2016-05-09 NOTE — Progress Notes (Signed)
Cardiology Office Note    Date:  05/09/2016   ID:  Jonathandavid Marlett, DOB [birth year], MRN [id number]  PCP:  Sharmon Leyden, MD  Cardiologist:   [name], MD   Chief Complaint  Patient presents with  . Follow-up    patient reports shortness of breath with rest and dizziness with position changes    History of Present Illness:  Tallie Hevia is a 65 y.o. 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  Daily presents with complaints of orthostatic dizziness and has a roughly 20 mmHg drop in his blood pressure upon standing. His shortness of breath appears to be at baseline  He was recently diagnosed with sigmoid colon cancer and underwent a partial colectomy with Dr. Wynona Neat at [facility], Nov 1. He saw Dr. Frederic Jericho on November 17.  On November 15 his creatinine had increased to 4.66 and he weighed 248 lb on that day, he saw the nephrologist (Dr. [name]) at [facility]. He was advised to reduce the dose of metolazone to once weekly and his potassium supplement dose was also decreased. He has a follow up appointment with a nephrologist on January 19.   His appetite has been poor and his weight was reportedly as low as 234 pounds (per pt). Today he weighs 244 pounds. Prior to surgery we had estimated that his dry weight was around 260 pounds, and he has been steadily losing weight this year.  His abdominal wound has mostly healed but he has a little bit of superficial purulent drainage with minimal erythema just lateral to his umbilicus. A nice fever or chills or abdominal pain.  He continues to take amiodarone and remains in normal rhythm. His liver function tests are  normal.  He was doing poorly with aggressive diuretic therapy for heart failure and right heart catheterization performed earlier this year demonstrated that he actually had low left ventricular filling pressures. Pulmonary function tests confirm that he has advanced chronic lung disease which probably explains much of his chronic dyspnea. He continues to have NYHA functional class II dyspnea . He continues to have leg edema which is likely attributable to right heart failure and peripheral venous insufficiency. Today he only has mild swelling in his left leg and there is no edema in the right leg at all.  Interrogation of his CRT-D device shows 100% biventricular pacing, increased levels of activity, and improved heart rate histogram likely as a consequence of more activity. There has been no atrial fibrillation and no ventricular tachycardia. He has infrequent atrial pacing. Battery longevity is estimated at about 7.5 years.  Mr. Stief had severe ischemic cardiomyopathy with left ventricular ejection fraction estimated to be approximately 30% by echocardiogram performed in October 2016. That echo describes septal and apical akinesis with global hypokinesis. He had evidence of restrictive filling as well as moderate right ventricular systolic dysfunction and moderate aortic insufficiency. Echo in May 2017 (after PCI and CRT) shows improved LVEF 45-50% and normal function of the mitral valve prosthesis. Right heart catheterization in May 2017 showed right atrial pressure 8, mean wedge pressure 6, cardiac index 2.2 L/minute.   He has an extensive history of coronary artery disease. He tells me that he thinks he has had 9 cardiac catheterizations and 5 stents.  At least on one admission he had 2 episodes of cardiac arrest. I think this was during his hospitalization for bypass surgery/valve replacement. He underwent bypass surgery in [place] in 2015 with synchronous mitral valve replacement with a  biological prosthesis. He has a history of previous stent placed in the LAD artery, type of stent and date of procedure unknown. He underwent placement of a bare metal stent to the ostium of the left circumflex coronary artery in September 2015. He subsequently underwent placement of 2 drug-eluting stents at [facility] in October 2016, placed in the right coronary artery and left circumflex coronary artery respectively. At the time of that cardiac catheterization he had 50% stenosis in the distal left main, 90% proximal LAD, 70% in-stent restenosis proximal LAD, patent LIMA to distal LAD, 80% RCA. No mention is made of his second bypass graft, which I presume is occluded. He is on combination aspirin/clopidogrel.  At the time of his bypass surgery received a 31 mm [employer] mitral bioprosthetic valve. By echocardiogram last October 2016 the mean transvalvular gradient was 5 mmHg. The same echocardiogram described a trileaflet unrestricted aortic valve with moderate insufficiency.   His chart describes a history of postoperative atrial fibrillation. He is not on anticoagulants. He takes low-dose amiodarone.  He has a history of left bundle branch block and he received a CRT-D [employer] device on 07/01/2015. The device and the leads are MRI conditional.  He has advanced chronic kidney disease. With recent baseline creatinine around 2.0-2.5. During his hospitalization in January 2017 the peak creatinine reached 4.6 (acute diarrheal illness), reaching a similar level in August when he had nausea and vomiting.[name] He has type 2 diabetes mellitus (not requiring insulin), complicated by kidney disease and neuropathy.  He takes atorvastatin in a relatively low dose. He has long-standing treated hypertension. He is a former smoker and has a history of COPD and takes bronchodilators. He has a history of migraine headaches treated with Fioricet. He underwent sigmoid colectomy in November 2017 for  colon cancer.  Past Medical History:  Diagnosis Date  . Biventricular ICD (implantable cardioverter-defibrillator) - [facility] 09/05/2015   Jul 01, 2015 [facility] Dynagen X4  . CAD (coronary artery disease) 09/05/2015   CABG 2015 PCI-BMS ostial LCX Sept 2015 04/05/2015 cath 50% distal LM, 90% prox LAD, old stent prox LAD 70% ISR, patent LIMA-LAD, 80% prox RCA PCI-DES x2 RCA and LCX   . Chronic systolic CHF (congestive heart failure) ([place]) 09/05/2015   a. [place] 10/29/15: PCW 8, CI 2.2. Overall low filling presssures. Mean PA 20; b. echo 10/30/15: EF 45-50%, not tech suff to allwo for LV diastolic fxn, mild AI, nl appearing MVR  . CKD stage 4 due to type 2 diabetes mellitus ([place]) 09/05/2015  . Diabetes mellitus, type 2 ([place]) 09/05/2015  . Essential hypertension 09/05/2015  . History of mitral valve replacement with bioprosthetic valve 09/05/2015   31 mm Edwards 2015, Dr. Jerelene Redden  . Postoperative atrial fibrillation ([place]) 09/05/2015    Past Surgical History:  Procedure Laterality Date  . CARDIAC CATHETERIZATION N/A 10/29/2015   Procedure: Right Heart Cath;  Surgeon: Jolaine Artist, MD;  Location: [place];  Service: Cardiovascular;  Laterality: N/A;  . CARDIAC VALVE REPLACEMENT  2015   92mm Edwards bioprosthesis  . CORONARY ANGIOPLASTY    . CORONARY ARTERY BYPASS GRAFT  2015   [facility], Dr. Jerelene Redden.    Current Medications: Outpatient Medications Prior to Visit  Medication Sig Dispense Refill  .  albuterol (PROVENTIL HFA;VENTOLIN HFA) 108 (90 Base) MCG/ACT inhaler Inhale 2 puffs into the lungs every 6 (six) hours as needed for wheezing or shortness of breath.    [name] amiodarone (PACERONE) 200 MG tablet Take 1 tablet (200 mg total) by mouth daily. 90 tablet 3  . aspirin 81 MG tablet Take 81 mg by mouth daily.    [name] atorvastatin (LIPITOR) 10 MG tablet Take 1 tablet (10 mg total) by mouth daily. 30 tablet 11  . bisacodyl (DULCOLAX) 5 MG EC tablet Take 5 mg by mouth daily as needed for moderate constipation.     . butalbital-acetaminophen-caffeine (FIORICET WITH CODEINE) 50-325-40-30 MG capsule Take 1 capsule by mouth every 4 (four) hours as needed for headache.    . Cholecalciferol (VITAMIN D3) 2000 units TABS Take 1 tablet by mouth daily.    . clopidogrel (PLAVIX) 75 MG tablet Take 75 mg by mouth daily.    [name] dicyclomine (BENTYL) 20 MG tablet Take 20 mg by mouth every 6 (six) hours.    . DULoxetine (CYMBALTA) 60 MG capsule Take 60 mg by mouth daily.     . fluticasone (FLONASE) 50 MCG/ACT nasal spray Place 1 spray into both nostrils daily.    [name] gabapentin (NEURONTIN) 100 MG capsule Take 100 mg by mouth 3 (three) times daily.    [name] glipiZIDE (GLUCOTROL) 2.5 mg TABS tablet Take 2.5 mg by mouth daily before breakfast.    . glucose blood test strip 1 each by Other route as needed for other. Use as instructed    . HYDROcodone-acetaminophen (NORCO/VICODIN) 5-325 MG tablet Take 1 tablet by mouth every 6 (six) hours as needed for moderate pain.    . isosorbide mononitrate (IMDUR) 60 MG 24 hr tablet Take 60 mg by mouth daily.    . metolazone (ZAROXOLYN) 2.5 MG tablet Take 1 tablet (2.5 mg total) by mouth daily as needed. For weight gain greater than 4 pounds 30 tablet 5  . metoprolol succinate (TOPROL-XL) 25 MG 24 hr tablet Take 50 mg by mouth daily.     . nitroGLYCERIN (NITROSTAT) 0.4 MG SL tablet Place 0.4 mg under the tongue every 5 (five) minutes as needed for chest pain.    . pantoprazole (PROTONIX) 40 MG tablet Take 40 mg by mouth daily.    . potassium chloride SA (K-DUR,KLOR-CON) 20 MEQ tablet Take 1 tablet (20 mEq total) by mouth 2 (two) times daily. Take 1 additional potassium tab when taking metolazone 60 tablet 5  . tiotropium (SPIRIVA HANDIHALER) 18 MCG inhalation capsule Place 1 capsule (18 mcg total) into inhaler and inhale daily. 30 capsule 11  . topiramate (TOPAMAX) 25 MG tablet Take 50 mg by mouth 2 (two) times daily.     [name] torsemide (DEMADEX) 20 MG tablet Take 2 tablet (40 mg total) by mouth  daily only if weight is over 260 pounds. 60 tablet 11  . umeclidinium-vilanterol (ANORO ELLIPTA) 62.5-25 MCG/INH AEPB Inhale 1 puff into the lungs daily. 1 each 5   No facility-administered medications prior to visit.      Allergies:   Hydrochlorothiazide; Amoxicillin-pot clavulanate; Clindamycin; Lisinopril; Meloxicam; Topiramate; and Tamsulosin   Social History   Social History  . Marital status: Married    Spouse name: N/A  . Number of children: N/A  . Years of education: N/A   Social History Main Topics  . Smoking status: Current Some Day Smoker    Packs/day: 0.50    Years: 40.00  . Smokeless tobacco: Never Used  .  Alcohol use No  . Drug use: No  . Sexual activity: Not Currently   Other Topics Concern  . None   Social History Narrative  . None     Family History:  The patient's family history includes Hyperlipidemia in his father; Hypertension in his father.   ROS:   Please see the history of present illness.    ROS All other systems reviewed and are negative.   PHYSICAL EXAM:   VS:  BP 120/64   Pulse 62   Ht 6' (1.829 m)   Wt 244 lb (110.7 kg)   BMI 33.09 kg/m    GEN: Well nourished, well developed, in no acute distress  HEENT: normal  Neck: no JVD, carotid bruits, or masses Cardiac: Paradoxically split second heart sound, RRR; no murmurs, rubs, or gallops, 1-2+ symmetrical ankle edema , healthy subclavian defibrillator site Respiratory: Reduced breath sounds globally, but otherwise clear to auscultation bilaterally, normal work of breathing GI: soft, nontender, nondistended, + BS. He has a scar to the left of the umbilicus to the pubis. This has healed nicely in its inferior portion but he has a roughly 4 mm wide, 2-3 millimeter deep poorly healed area to the left of the umbilicus where there is scant purulent yellowish drainage. MS: no deformity or atrophy  Skin: warm and dry, no rash Neuro:  Alert and Oriented x 3, Strength and sensation are  intact Psych: euthymic mood, full affect  Wt Readings from Last 3 Encounters:  05/09/16 244 lb (110.7 kg)  03/14/16 258 lb 3.2 oz (117.1 kg)  03/09/16 257 lb (116.6 kg)      Studies/Labs Reviewed:   EKG:  EKG is not ordered today.    Recent Labs: 10/29/2015: B Natriuretic Peptide 124.6; Hemoglobin 11.5; Platelets 203 03/10/2016: ALT 17; BUN 33; Creat 2.75; Potassium 3.9; Sodium 141   Lipid Panel    Component Value Date/Time   CHOL 108 (L) 03/10/2016 0925   TRIG 228 (H) 03/10/2016 0925   HDL 28 (L) 03/10/2016 0925   CHOLHDL 3.9 03/10/2016 0925   VLDL 46 (H) 03/10/2016 0925   LDLCALC 34 03/10/2016 0925      ASSESSMENT:    1. Systolic and diastolic CHF, acute on chronic ([place])   2. Chronic obstructive pulmonary disease, unspecified COPD type ([place])   3. Coronary artery disease involving native coronary artery of native heart with other form of angina pectoris ([place])   4. History of mitral valve replacement with bioprosthetic valve   5. Biventricular ICD (implantable cardioverter-defibrillator) in place   6. CKD stage 4 due to type 2 diabetes mellitus ([place])   7. Essential hypertension   8. Type 2 diabetes mellitus with stage 4 chronic kidney disease, without long-term current use of insulin ([place])   9. Mixed hyperlipidemia   10. Postoperative atrial fibrillation (H[facility])   11. Venous insufficiency      PLAN:  In order of problems listed above:  1. CHF: Class II symptoms. No clinical signs of hypervolemia. I believe he is a little "dry" now. Have asked him to discontinue his metolazone. If I understand correctly, he is now taking torsemide again. We'll recheck labs in a couple of weeks, before he returns to see his nephrologist. 2. COPD: Currently dyspnea is primarily due to lung disease. the PFT pattern and the response to bronchodilator suggests obstructive lung disease is the bigger problem. Repeat PFTs yearly for amiodarone treatment monitoring 3. CAD: He received 2  drug-eluting stents in October 2016, he does  not have angina pectoris and is a poor candidate for repeat angiography due to his renal function. Left ventricular ejection fraction is improved compared to last year. 4. S/p MVR: 31 mm bioprosthesis with normal function by recent echocardiogram. Mitral gradient was lower on the echo performed recently compared to the echo from [place]. Avoid tachycardia. 5. CRT-D: Normal function of his biventricular defibrillator. 100% biventricular pacing. No atrial or ventricular arrhythmia detected. His device is MRI conditional.  6. CKD stage 4: With recent and repeated episodes of severe acute renal insufficiency, 3rd time this year on his abdominal surgery. Spironolactone and ACE inhibitor were stopped early in the year and clearly it is not safe to restart these. 7. HTN: Blood pressure is good today, has some orthostatic hypotension possibly due to excessive diuresis 8. DM: Appears to be well controlled.  Hemoglobin A1c 6.2% in January 2017 9. HLP: His lipid profile is excellent 10. AFib: probably this was just a postoperative event and for this reason he was not started on anticoagulation. None has been detected since implantation of his defibrillator in January. Continue amiodarone for now, since he seems to be at very high risk for arrhythmia recurrence. Check liver function tests and TSH at least twice yearly.  11. Peripheral venous insufficiency: Despite only minimally elevated right atrial pressures at cardiac catheterization, he had prominent persistent leg edema, L>R. This is consistent with substantial venous insufficiency. Keep legs elevated. Recommend compression stockings. 12. Surgical wound: He appears to have a small superficial surgical wound infection that will likely heal without need for antibiotics or debridement. Asked him to call if he develops surrounding erythema tenderness, worsening drainage, fever or chills.    Medication Adjustments/Labs and  Tests Ordered: Current medicines are reviewed at length with the patient today.  Concerns regarding medicines are outlined above.  Medication changes, Labs and Tests ordered today are listed in the Patient Instructions below. Patient Instructions  Dr Sallyanne Kuster has recommended making the following medication changes: 1. STOP Metolazone  Your physician recommends that you return for lab work in 2 weeks.  Dr Sallyanne Kuster recommends that you schedule a follow-up appointment in 3 months with a defibrillator check.  If you need a refill on your cardiac medications before your next appointment, please call your pharmacy.    Signed, [name], MD  05/09/2016 3:35 PM    [facility] [address], [place], [place]  [address] Phone: [phone]; Fax: [phone]

## 2016-05-12 ENCOUNTER — Telehealth: Payer: Self-pay | Admitting: Cardiovascular Disease

## 2016-05-12 NOTE — Telephone Encounter (Signed)
His swelling is related not only to congestive heart failure, but also to peripheral venous insufficiency. His kidney function was substantially worse recently. I think he is due to have labs performed next week. Let's wait for those results before he starts taking metolazone again

## 2016-05-12 NOTE — Telephone Encounter (Signed)
SPOKE  WITH  CAREGIVER  PT  HAS  SIGNIFICANT   EDEMA   AND  SINCE  METOLAZONE   WAS  STOPPED  HAS  HAD  4  LB  WEIGHT  GAIN    HAS  TORSEMIDE  AVAILABLE  BUT  CAN  ONLY TAKE IF  WEIGHT IS OVER  260  PT  IS ONLY  AT  248  WILL FORWARD TO  DR  Sallyanne Kuster FOR  REVIEW AMBER  (CAREGIVER) IS ON CALL THIS  WEEKEND   PT  AWARE  TO  CALL AMBER IF  NEEDED  AND  IF S/S INCREASE TO   GO TO ER FOR  EVAL .

## 2016-05-12 NOTE — Telephone Encounter (Signed)
Pt c/o swelling: STAT is pt has developed SOB within 24 hours  1. How long have you been experiencing swelling? 4 days  2. Where is the swelling located? Lower extremidies  3.  Are you currently taking a "fluid pill"? No it has been DC  4.  Are you currently SOB? YES BUT NO INCREASE AND COUGH  5.  Have you traveled recently? NO

## 2016-05-12 NOTE — Telephone Encounter (Signed)
VOICE MAIL  LEFT  FOR  AMBER  RE  DR  [id number] RESPONSE  TO MESSAGE .Adonis Housekeeper

## 2016-05-15 ENCOUNTER — Telehealth: Payer: Self-pay | Admitting: Cardiovascular Disease

## 2016-05-15 NOTE — Telephone Encounter (Signed)
Amber [facility]) is calling because Mr. Kempfer has gained an additional 3lbs , which makes it a total of 7lbs in a week . Please call   Thanks

## 2016-05-15 NOTE — Telephone Encounter (Signed)
Spoke with [facility] home health nurse she states that she is just reporting weight for pt pt is at 251 pounds today denies any SOB LE edema is 2+ pitting edema and hands are swelling but not pitting. She states that current order (TORSEMIDE AVAILABLE BUT ONLY TAKE IF  WEIGHT IS OVER  260) so she will still hold. Amber will continue to track weight and report and also she will draw labs 05-22-16 (MAG, BNP, BMP) as ordered at his last OV 05-09-16.

## 2016-05-19 LAB — CUP PACEART INCLINIC DEVICE CHECK
Battery Remaining Longevity: 84 mo
Battery Remaining Percentage: 100 %
Brady Statistic RA Percent Paced: 13 %
Brady Statistic RV Percent Paced: 100 %
Date Time Interrogation Session: 20171128153800
HighPow Impedance: 62 Ohm
Implantable Pulse Generator Implant Date: 20170119
Lead Channel Impedance Value: 360 Ohm
Lead Channel Impedance Value: 547 Ohm
Lead Channel Impedance Value: 665 Ohm
Lead Channel Pacing Threshold Amplitude: 0.9 V
Lead Channel Pacing Threshold Amplitude: 1.2 V
Lead Channel Pacing Threshold Amplitude: 2.4 V
Lead Channel Pacing Threshold Pulse Width: 0.4 ms
Lead Channel Pacing Threshold Pulse Width: 0.7 ms
Lead Channel Pacing Threshold Pulse Width: 0.8 ms
Lead Channel Setting Pacing Amplitude: 2 V
Lead Channel Setting Pacing Amplitude: 2.8 V
Lead Channel Setting Pacing Amplitude: 4 V
Lead Channel Setting Pacing Pulse Width: 0.4 ms
Lead Channel Setting Pacing Pulse Width: 0.7 ms
Lead Channel Setting Sensing Sensitivity: 0.6 mV
Lead Channel Setting Sensing Sensitivity: 1 mV
Pulse Gen Serial Number: 160713

## 2016-06-01 ENCOUNTER — Telehealth: Payer: Self-pay

## 2016-06-01 NOTE — Telephone Encounter (Signed)
Faxed orders for labwork (BNP, BMP, and Mag) to [facility] on 05/23/16.

## 2016-06-01 NOTE — Telephone Encounter (Signed)
Faxed medication orders to Dominic [name] at [facility] on 05/23/16.

## 2016-06-19 LAB — BASIC METABOLIC PANEL
BUN: 19 mg/dL (ref 7–25)
CALCIUM: 8.5 mg/dL — AB (ref 8.6–10.3)
CHLORIDE: 110 mmol/L (ref 98–110)
CO2: 21 mmol/L (ref 20–31)
Creat: 1.93 mg/dL — ABNORMAL HIGH (ref 0.70–1.25)
GLUCOSE: 86 mg/dL (ref 65–99)
POTASSIUM: 4.1 mmol/L (ref 3.5–5.3)
SODIUM: 139 mmol/L (ref 135–146)

## 2016-06-19 LAB — BRAIN NATRIURETIC PEPTIDE: Brain Natriuretic Peptide: 1657.8 pg/mL — ABNORMAL HIGH (ref <100)

## 2016-06-19 LAB — MAGNESIUM: Magnesium: 2.1 mg/dL (ref 1.5–2.5)

## 2016-06-21 ENCOUNTER — Telehealth: Payer: Self-pay | Admitting: *Deleted

## 2016-06-21 NOTE — Telephone Encounter (Signed)
Left message for pt to call.

## 2016-06-21 NOTE — Telephone Encounter (Signed)
Spoke with pt wife, they no longer have a HHN. His weight today is 243. Yesterday was 243.6lb, they do not write the weights down so they do not know how much weight he has gained. He has edema in both feet and legs and the left is worse. He c/o SOB and being unable to lie flat to sleep but not every night. He is currently taking no diuretics, he reports they were stopped when he had surgery. Will make dr croitoru aware

## 2016-06-21 NOTE — Telephone Encounter (Signed)
Please restart diuretics at his previous dose and recheck basic metabolic panel and BNP in 2-3 weeks. Thanks! MCr

## 2016-06-21 NOTE — Telephone Encounter (Signed)
-----   Message from [name], MD sent at 06/20/2016 10:31 AM EST ----- Renal function is substantially better than last year and BNP suggests he is volume overloaded. Is Home health still seeing him? How much does he weigh. I think we probably need to increase his diuretics. What is he taking right now?

## 2016-06-22 ENCOUNTER — Encounter: Payer: Self-pay | Admitting: Cardiovascular Disease

## 2016-06-22 NOTE — Telephone Encounter (Signed)
This encounter was created in error - please disregard.

## 2016-06-22 NOTE — Telephone Encounter (Signed)
Spoke with pt, aware of dr croitoru's recommendations to restart torsemide as he was taking prior to surgery. Patient will call if continues to have orthopnea.

## 2016-06-22 NOTE — Telephone Encounter (Signed)
New Message    Per wife weight is 243lb  Returning [place] call

## 2016-08-09 ENCOUNTER — Encounter: Payer: Self-pay | Admitting: Cardiovascular Disease

## 2016-08-09 ENCOUNTER — Ambulatory Visit (INDEPENDENT_AMBULATORY_CARE_PROVIDER_SITE_OTHER): Payer: Medicare PPO | Admitting: Cardiovascular Disease

## 2016-08-09 VITALS — BP 134/71 | HR 66 | Ht 72.0 in | Wt 241.0 lb

## 2016-08-09 DIAGNOSIS — J449 Chronic obstructive pulmonary disease, unspecified: Secondary | ICD-10-CM | POA: Diagnosis not present

## 2016-08-09 DIAGNOSIS — E1122 Type 2 diabetes mellitus with diabetic chronic kidney disease: Secondary | ICD-10-CM

## 2016-08-09 DIAGNOSIS — E1169 Type 2 diabetes mellitus with other specified complication: Secondary | ICD-10-CM

## 2016-08-09 DIAGNOSIS — Z953 Presence of xenogenic heart valve: Secondary | ICD-10-CM

## 2016-08-09 DIAGNOSIS — I9789 Other postprocedural complications and disorders of the circulatory system, not elsewhere classified: Secondary | ICD-10-CM

## 2016-08-09 DIAGNOSIS — N184 Chronic kidney disease, stage 4 (severe): Secondary | ICD-10-CM | POA: Diagnosis not present

## 2016-08-09 DIAGNOSIS — I25118 Atherosclerotic heart disease of native coronary artery with other forms of angina pectoris: Secondary | ICD-10-CM | POA: Diagnosis not present

## 2016-08-09 DIAGNOSIS — I872 Venous insufficiency (chronic) (peripheral): Secondary | ICD-10-CM

## 2016-08-09 DIAGNOSIS — E669 Obesity, unspecified: Secondary | ICD-10-CM

## 2016-08-09 DIAGNOSIS — I5022 Chronic systolic (congestive) heart failure: Secondary | ICD-10-CM | POA: Diagnosis not present

## 2016-08-09 DIAGNOSIS — E782 Mixed hyperlipidemia: Secondary | ICD-10-CM

## 2016-08-09 DIAGNOSIS — I10 Essential (primary) hypertension: Secondary | ICD-10-CM | POA: Diagnosis not present

## 2016-08-09 DIAGNOSIS — I4891 Unspecified atrial fibrillation: Secondary | ICD-10-CM

## 2016-08-09 DIAGNOSIS — Z95810 Presence of automatic (implantable) cardiac defibrillator: Secondary | ICD-10-CM | POA: Diagnosis not present

## 2016-08-09 LAB — CUP PACEART INCLINIC DEVICE CHECK
Date Time Interrogation Session: 20180228050000
HighPow Impedance: 68 Ohm
Lead Channel Impedance Value: 621 Ohm
Lead Channel Pacing Threshold Amplitude: 0.9 V
Lead Channel Pacing Threshold Amplitude: 1.2 V
Lead Channel Pacing Threshold Amplitude: 2 V
Lead Channel Pacing Threshold Pulse Width: 0.7 ms
Lead Channel Sensing Intrinsic Amplitude: 13.8 mV
Lead Channel Sensing Intrinsic Amplitude: 5.7 mV
Lead Channel Setting Pacing Amplitude: 2.8 V
Lead Channel Setting Pacing Pulse Width: 0.4 ms
Lead Channel Setting Sensing Sensitivity: 0.6 mV
Lead Channel Setting Sensing Sensitivity: 1 mV
MDC IDC MSMT LEADCHNL LV SENSING INTR AMPL: 19.1 mV
MDC IDC MSMT LEADCHNL RA IMPEDANCE VALUE: 707 Ohm
MDC IDC MSMT LEADCHNL RA PACING THRESHOLD PULSEWIDTH: 0.4 ms
MDC IDC MSMT LEADCHNL RV IMPEDANCE VALUE: 395 Ohm
MDC IDC MSMT LEADCHNL RV PACING THRESHOLD PULSEWIDTH: 0.4 ms
MDC IDC PG IMPLANT DT: 20170119
MDC IDC SET LEADCHNL LV PACING AMPLITUDE: 3 V
MDC IDC SET LEADCHNL LV PACING PULSEWIDTH: 0.7 ms
MDC IDC SET LEADCHNL RA PACING AMPLITUDE: 2 V
Pulse Gen Serial Number: 160713

## 2016-08-09 NOTE — Progress Notes (Signed)
Cardiology Office Note    Date:  08/10/2016   ID:  Dominic [name], DOB [birth year], MRN [id number]  PCP:  Sharmon Leyden, MD  Cardiologist:   [name], MD   No chief complaint on file.   History of Present Illness:  Dominic [name] is a 66 y.o. 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  His shortness of breath appears to be at baseline  He underwent a partial colectomy with Dr. Wynona Neat at [place], Apr 12 2016. He saw his oncologist in January and he is expected to have an excellent prognosis with no additional need for chemotherapy. He saw Dr. Frederic Jericho on Jul 07, 2016 - BP was a little high that day. On November 15 his creatinine had increased to 4.66 and he weighed 248 lb on that day, he saw the nephrologist (Dr. [name]) at [facility]. He was advised to reduce the dose of metolazone to once weekly and his potassium supplement dose was also decreased. He had a follow up appointment with a nephrologist on January 22. At that point he had lost 13 pounds and his creatinine was better at 1.95. Repeat labs on January 1 showed a creatinine of 2.3  Today he weighs 241 pounds. He continues to sleep with 4 pillows.  He continues to take amiodarone and remains in normal rhythm. His liver function tests are normal. Pulmonary function tests confirm that he has advanced chronic lung disease which probably explains much of his chronic dyspnea (NYHA functional class II) . He continues to have mild leg edema (more obvious in the left calf) which is likely attributable to right heart failure and peripheral venous insufficiency. Today he only has mild swelling in his left leg and there is  no edema in the right leg at all.  Interrogation of his CRT-D [facility] Dynagen X4 device shows 100% biventricular pacing (LV ring 4 to can)), increased levels of activity, and improved heart rate histogram, improved "footprint". There has been no atrial fibrillation and no ventricular tachycardia. He has infrequent atrial pacing. Battery longevity is estimated at about 8 years. Presenting rhythm today is atrial sensed, ventricular paced, based on the intracardiac electrogram. P waves that are very hard to see on surface ECG  [name] had severe ischemic cardiomyopathy with left ventricular ejection fraction estimated to be approximately 30% by echocardiogram performed in October 2016. That echo describes septal and apical akinesis with global hypokinesis. He had evidence of restrictive filling as well as moderate right ventricular systolic dysfunction and moderate aortic insufficiency. Echo in May 2017 (after PCI and CRT) shows improved LVEF 45-50% and normal function of the mitral valve prosthesis. Right heart catheterization in May 2017 showed right atrial pressure 8, mean wedge pressure 6, cardiac index 2.2 L/minute.   He has an extensive history of coronary artery disease. He tells me that he thinks he has had 9 cardiac catheterizations and 5 stents. At least on one admission he had 2 episodes of cardiac arrest. I think this was during his hospitalization for bypass surgery/valve replacement. He underwent bypass surgery in [place] in 2015 with synchronous mitral valve replacement with a biological prosthesis. He has a history of previous stent placed in the LAD artery, type of stent and date of procedure unknown.  He underwent placement of a bare metal stent to the ostium of the left circumflex coronary artery in September 2015. He subsequently underwent placement of 2 drug-eluting stents at [facility] in October 2016, placed in the right coronary artery and left circumflex coronary artery  respectively. At the time of that cardiac catheterization he had 50% stenosis in the distal left main, 90% proximal LAD, 70% in-stent restenosis proximal LAD, patent LIMA to distal LAD, 80% RCA. No mention is made of his second bypass graft, which I presume is occluded. He is on combination aspirin/clopidogrel.  At the time of his bypass surgery received a 31 mm [employer] mitral bioprosthetic valve. By echocardiogram last October 2016 the mean transvalvular gradient was 5 mmHg. The same echocardiogram described a trileaflet unrestricted aortic valve with moderate insufficiency.   His chart describes a history of postoperative atrial fibrillation. He is not on anticoagulants. He takes low-dose amiodarone.  He has a history of left bundle branch block and he received a CRT-D [employer] device on 07/01/2015. The device and the leads are MRI conditional.  He has advanced chronic kidney disease. With recent baseline creatinine around 2.0-2.5. During his hospitalization in January 2017 the peak creatinine reached 4.6 (acute diarrheal illness), reaching a similar level in August when he had nausea and vomiting.Dominic [name] He has type 2 diabetes mellitus (not requiring insulin), complicated by kidney disease and neuropathy.  He takes atorvastatin in a relatively low dose. He has long-standing treated hypertension. He is a former smoker and has a history of COPD and takes bronchodilators. He has a history of migraine headaches treated with Fioricet. He underwent sigmoid colectomy in November 2017 for colon cancer.  Past Medical History:  Diagnosis Date  . Biventricular ICD (implantable cardioverter-defibrillator) - [facility] 09/05/2015   Jul 01, 2015 [name] Dynagen X4  . CAD (coronary artery disease) 09/05/2015   CABG 2015 PCI-BMS ostial LCX Sept 2015 04/05/2015 cath 50% distal LM, 90% prox LAD, old stent prox LAD 70% ISR, patent LIMA-LAD, 80% prox RCA PCI-DES x2 RCA and LCX   . Chronic systolic CHF  (congestive heart failure) ([place]) 09/05/2015   a. [facility] 10/29/15: PCW 8, CI 2.2. Overall low filling presssures. Mean PA 20; b. echo 10/30/15: EF 45-50%, not tech suff to allwo for LV diastolic fxn, mild AI, nl appearing MVR  . CKD stage 4 due to type 2 diabetes mellitus ([place]) 09/05/2015  . Diabetes mellitus, type 2 ([place]) 09/05/2015  . Essential hypertension 09/05/2015  . History of mitral valve replacement with bioprosthetic valve 09/05/2015   31 mm Edwards 2015, Dr. Jerelene Redden  . Postoperative atrial fibrillation ([place]) 09/05/2015    Past Surgical History:  Procedure Laterality Date  . CARDIAC CATHETERIZATION N/A 10/29/2015   Procedure: Right Heart Cath;  Surgeon: Jolaine Artist, MD;  Location: [place] [facility];  Service: Cardiovascular;  Laterality: N/A;  . CARDIAC VALVE REPLACEMENT  2015   4mm Edwards bioprosthesis  . CORONARY ANGIOPLASTY    . CORONARY ARTERY BYPASS GRAFT  2015   [facility], Dr. Jerelene Redden.    Current Medications: Outpatient Medications Prior to Visit  Medication Sig Dispense Refill  . albuterol (PROVENTIL HFA;VENTOLIN HFA) 108 (90 Base) MCG/ACT inhaler Inhale 2 puffs into the lungs every 6 (six) hours as needed for wheezing or shortness of breath.    Dominic [name] amiodarone (PACERONE) 200 MG tablet Take 1 tablet (200 mg total) by mouth daily. 90 tablet 3  . aspirin 81 MG tablet Take 81 mg by mouth daily.    Dominic [name]  atorvastatin (LIPITOR) 10 MG tablet Take 1 tablet (10 mg total) by mouth daily. 30 tablet 11  . bisacodyl (DULCOLAX) 5 MG EC tablet Take 5 mg by mouth daily as needed for moderate constipation.    . butalbital-acetaminophen-caffeine (FIORICET WITH CODEINE) 50-325-40-30 MG capsule Take 1 capsule by mouth every 4 (four) hours as needed for headache.    . clopidogrel (PLAVIX) 75 MG tablet Take 75 mg by mouth daily.    . DULoxetine (CYMBALTA) 60 MG capsule Take 60 mg by mouth daily.     . fluticasone (FLONASE) 50 MCG/ACT nasal spray Place 1 spray into both nostrils daily.    Dominic [name] glipiZIDE  (GLUCOTROL) 2.5 mg TABS tablet Take 2.5 mg by mouth daily before breakfast.    . glucose blood test strip 1 each by Other route as needed for other. Use as instructed    . isosorbide mononitrate (IMDUR) 60 MG 24 hr tablet Take 60 mg by mouth daily.    . metolazone (ZAROXOLYN) 2.5 MG tablet Take 1 tablet (2.5 mg total) by mouth daily as needed. For weight gain greater than 4 pounds 30 tablet 5  . metoprolol succinate (TOPROL-XL) 25 MG 24 hr tablet Take 50 mg by mouth daily.     . nitroGLYCERIN (NITROSTAT) 0.4 MG SL tablet Place 0.4 mg under the tongue every 5 (five) minutes as needed for chest pain.    Dominic [name] tiotropium (SPIRIVA HANDIHALER) 18 MCG inhalation capsule Place 1 capsule (18 mcg total) into inhaler and inhale daily. 30 capsule 11  . tiZANidine (ZANAFLEX) 2 MG tablet Take 1 tablet by mouth 2 (two) times daily as needed for pain.    Dominic [name] topiramate (TOPAMAX) 25 MG tablet Take 50 mg by mouth 2 (two) times daily.     Dominic [name] torsemide (DEMADEX) 20 MG tablet Take 2 tablet (40 mg total) by mouth daily only if weight is over 260 pounds. 60 tablet 11  . umeclidinium-vilanterol (ANORO ELLIPTA) 62.5-25 MCG/INH AEPB Inhale 1 puff into the lungs daily. 1 each 5  . Cholecalciferol (VITAMIN D3) 2000 units TABS Take 1 tablet by mouth daily.    Dominic [name] dicyclomine (BENTYL) 20 MG tablet Take 20 mg by mouth every 6 (six) hours.    . gabapentin (NEURONTIN) 100 MG capsule Take 100 mg by mouth 3 (three) times daily.    Dominic [name] HYDROcodone-acetaminophen (NORCO/VICODIN) 5-325 MG tablet Take 1 tablet by mouth every 6 (six) hours as needed for moderate pain.    . pantoprazole (PROTONIX) 40 MG tablet Take 40 mg by mouth daily.    . potassium chloride SA (K-DUR,KLOR-CON) 20 MEQ tablet Take 1 tablet (20 mEq total) by mouth 2 (two) times daily. Take 1 additional potassium tab when taking metolazone (Patient not taking: Reported on 08/09/2016) 60 tablet 5   No facility-administered medications prior to visit.      Allergies:    Hydrochlorothiazide; Amoxicillin-pot clavulanate; Clindamycin; Lisinopril; Meloxicam; Topiramate; and Tamsulosin   Social History   Social History  . Marital status: Married    Spouse name: N/A  . Number of children: N/A  . Years of education: N/A   Social History Main Topics  . Smoking status: Current Some Day Smoker    Packs/day: 0.50    Years: 40.00  . Smokeless tobacco: Never Used  . Alcohol use No  . Drug use: No  . Sexual activity: Not Currently   Other Topics Concern  . None   Social History Narrative  . None     Family History:  The patient's family history includes Hyperlipidemia in his father; Hypertension in his father.   ROS:   Please see the history of present illness.    ROS All other systems reviewed and are negative.   PHYSICAL EXAM:   VS:  BP 134/71   Pulse 66   Ht 6' (1.829 m)   Wt 109.3 kg (241 lb)   BMI 32.69 kg/m    GEN: Well nourished, well developed, in no acute distress  HEENT: normal  Neck: no JVD, carotid bruits, or masses Cardiac: Paradoxically split second heart sound, RRR; no murmurs, rubs, or gallops, 1-2+ symmetrical ankle edema , healthy subclavian defibrillator site Respiratory: Reduced breath sounds globally, but otherwise clear to auscultation bilaterally, normal work of breathing GI: soft, nontender, nondistended, + BS. He has a scar to the left of the umbilicus to the pubis. This has healed nicely in its inferior portion but he has a roughly 4 mm wide, 2-3 millimeter deep poorly healed area to the left of the umbilicus where there is scant purulent yellowish drainage. MS: no deformity or atrophy  Skin: warm and dry, no rash Neuro:  Alert and Oriented x 3, Strength and sensation are intact Psych: euthymic mood, full affect  Wt Readings from Last 3 Encounters:  08/09/16 109.3 kg (241 lb)  05/09/16 110.7 kg (244 lb)  03/14/16 117.1 kg (258 lb 3.2 oz)      Studies/Labs Reviewed:   EKG:  EKG is ordered today.  Atrial sensed,  ventricular paced rhythm with small positive R waves in V1-V2, QTC 519 ms. The P waves are very hard to see on surface ECG, are confirmed by comparison with his intracardiac electrogram  Recent Labs: 10/29/2015: Hemoglobin 11.5; Platelets 203 03/10/2016: ALT 17 06/19/2016: Brain Natriuretic Peptide 1,657.8; BUN 19; Creat 1.93; Magnesium 2.1; Potassium 4.1; Sodium 139   Lipid Panel    Component Value Date/Time   CHOL 108 (L) 03/10/2016 0925   TRIG 228 (H) 03/10/2016 0925   HDL 28 (L) 03/10/2016 0925   CHOLHDL 3.9 03/10/2016 0925   VLDL 46 (H) 03/10/2016 0925   LDLCALC 34 03/10/2016 0925      ASSESSMENT:    1. Chronic obstructive pulmonary disease, unspecified COPD type ([place])   2. Chronic systolic heart failure ([place])   3. Coronary artery disease involving native coronary artery of native heart with other form of angina pectoris ([place])   4. History of mitral valve replacement with bioprosthetic valve   5. Biventricular ICD (implantable cardioverter-defibrillator) - [facility]   6. CKD stage 4 due to type 2 diabetes mellitus ([place])   7. Essential hypertension   8. Diabetes mellitus type 2 in obese ([place])   9. Mixed hyperlipidemia   10. Postoperative atrial fibrillation ([facility])   11. Venous insufficiency      PLAN:  In order of problems listed above:  1. CHF: Class II symptoms. No clinical signs of hypervolemia. His weight has been quite volatile over the last year because of a couple of episodes of acute kidney injury and his abdominal surgery. "Dry weight" seems to be right around 240 pounds. Creatinine baseline seems to be 2.0-2.2 2. COPD: Currently dyspnea is primarily due to lung disease. the PFT pattern and the response to bronchodilator suggests obstructive lung disease is the bigger problem. Repeat PFTs yearly for amiodarone treatment monitoring. Will order at his next office visit. 3. CAD: He received 2 drug-eluting stents in October 2016, he does not have angina pectoris and is a poor  candidate  for repeat angiography due to his renal function. Left ventricular ejection fraction is improved compared to last year. 4. S/p MVR: 31 mm bioprosthesis with normal function by recent echocardiogram. Mitral gradient was lower on the echo performed recently compared to the echo from [place]. Avoid tachycardia. 5. CRT-D: Normal function of his biventricular defibrillator. 100% biventricular pacing. No atrial or ventricular arrhythmia detected. His device is MRI conditional.  6. CKD stage 4: With recent and repeated episodes of severe acute renal insufficiency, 3rd time this year on his abdominal surgery. Spironolactone and ACE inhibitor were stopped early in the year and clearly it is not safe to restart these. Should not be on scheduled metolazone, but this could be used on an intermittent basis. We'll coordinate this with his nephrologist at [facility] 7. HTN: Blood pressure is good today, complaints of orthostatic hypotension are not prominent today 8. DM: Appears to be well controlled.   9. HLP: His lipid profile is excellent 10. AFib:  just a postoperative event and for this reason he was not started on anticoagulation. None has been detected since implantation of his defibrillator in January. Continue amiodarone for now, since he seems to be at very high risk for arrhythmia recurrence. Check liver function tests and TSH at least twice yearly.  11. Peripheral venous insufficiency: Despite only minimally elevated right atrial pressures at cardiac catheterization, he had prominent persistent leg edema, L>R. This is consistent with substantial venous insufficiency. Has only mild edema now. Keep legs elevated. Recommend compression stockings.    Medication Adjustments/Labs and Tests Ordered: Current medicines are reviewed at length with the patient today.  Concerns regarding medicines are outlined above.  Medication changes, Labs and Tests ordered today are listed in the Patient Instructions  below. Patient Instructions  Dr Sallyanne Kuster recommends that you continue on your current medications as directed. Please refer to the Current Medication list given to you today.  Remote monitoring is used to monitor your Pacemaker of ICD from home. This monitoring reduces the number of office visits required to check your device to one time per year. It allows [place] to keep an eye on the functioning of your device to ensure it is working properly. You are scheduled for a device check from home on Wednesday, May 30th, 2018. You may send your transmission at any time that day. If you have a wireless device, the transmission will be sent automatically. After your physician reviews your transmission, you will receive a postcard with your next transmission date.  Dr Sallyanne Kuster recommends that you schedule a follow-up appointment in 6 months with a defibrillator check. You will receive a reminder letter in the mail two months in advance. If you don't receive a letter, please call our office to schedule the follow-up appointment.  If you need a refill on your cardiac medications before your next appointment, please call your pharmacy.    Signed, [name], MD  08/10/2016 3:12 PM    [facility], [place], [place]  [address] Phone: [phone]; Fax: [phone]

## 2016-08-09 NOTE — Patient Instructions (Signed)
Dr Sallyanne Kuster recommends that you continue on your current medications as directed. Please refer to the Current Medication list given to you today.  Remote monitoring is used to monitor your Pacemaker of ICD from home. This monitoring reduces the number of office visits required to check your device to one time per year. It allows [place] to keep an eye on the functioning of your device to ensure it is working properly. You are scheduled for a device check from home on Wednesday, May 30th, 2018. You may send your transmission at any time that day. If you have a wireless device, the transmission will be sent automatically. After your physician reviews your transmission, you will receive a postcard with your next transmission date.  Dr Sallyanne Kuster recommends that you schedule a follow-up appointment in 6 months with a defibrillator check. You will receive a reminder letter in the mail two months in advance. If you don't receive a letter, please call our office to schedule the follow-up appointment.  If you need a refill on your cardiac medications before your next appointment, please call your pharmacy.

## 2016-08-10 NOTE — Addendum Note (Signed)
Addended by: Milderd Meager on: 08/10/2016 04:00 PM   Modules accepted: Orders

## 2016-11-08 ENCOUNTER — Ambulatory Visit (INDEPENDENT_AMBULATORY_CARE_PROVIDER_SITE_OTHER): Payer: Medicare HMO | Admitting: *Deleted

## 2016-11-08 DIAGNOSIS — I5022 Chronic systolic (congestive) heart failure: Secondary | ICD-10-CM

## 2016-11-10 NOTE — Progress Notes (Signed)
Remote ICD transmission.   

## 2016-11-17 ENCOUNTER — Encounter: Payer: Self-pay | Admitting: Cardiology

## 2016-11-21 LAB — CUP PACEART REMOTE DEVICE CHECK
Date Time Interrogation Session: 20180612100345
Lead Channel Setting Pacing Amplitude: 2 V
Lead Channel Setting Pacing Amplitude: 3 V
Lead Channel Setting Pacing Pulse Width: 0.7 ms
MDC IDC PG IMPLANT DT: 20170119
MDC IDC PG SERIAL: 160713
MDC IDC SET LEADCHNL LV SENSING SENSITIVITY: 1 mV
MDC IDC SET LEADCHNL RV PACING AMPLITUDE: 2.8 V
MDC IDC SET LEADCHNL RV PACING PULSEWIDTH: 0.4 ms
MDC IDC SET LEADCHNL RV SENSING SENSITIVITY: 0.6 mV

## 2016-12-25 ENCOUNTER — Other Ambulatory Visit: Payer: Self-pay | Admitting: Cardiovascular Disease

## 2016-12-25 NOTE — Telephone Encounter (Signed)
Rx has been sent to the pharmacy electronically. ° °

## 2017-01-26 ENCOUNTER — Other Ambulatory Visit: Payer: Self-pay | Admitting: Cardiovascular Disease

## 2017-01-27 LAB — BASIC METABOLIC PANEL
BUN / CREAT RATIO: 12 (ref 10–24)
BUN: 37 mg/dL — AB (ref 8–27)
CALCIUM: 8.9 mg/dL (ref 8.6–10.2)
CHLORIDE: 92 mmol/L — AB (ref 96–106)
CO2: 26 mmol/L (ref 20–29)
Creatinine, Ser: 3.11 mg/dL — ABNORMAL HIGH (ref 0.76–1.27)
GFR calc non Af Amer: 20 mL/min/{1.73_m2} — ABNORMAL LOW (ref >59)
GFR, EST AFRICAN AMERICAN: 23 mL/min/{1.73_m2} — AB (ref >59)
GLUCOSE: 107 mg/dL — AB (ref 65–99)
POTASSIUM: 2.8 mmol/L — AB (ref 3.5–5.2)
Sodium: 137 mmol/L (ref 134–144)

## 2017-02-01 ENCOUNTER — Telehealth: Payer: Self-pay

## 2017-02-01 NOTE — Telephone Encounter (Signed)
1. Type of surgery: colonoscopy 2. Date of surgery: pending 3. Surgeon: Dr Margaretmary Bayley 4. Medications that need to be held & how long: Plavix; 5 days 5. Fax and/or Phone: (p) [phone] (f) [phone]

## 2017-02-05 ENCOUNTER — Encounter: Payer: Self-pay | Admitting: Cardiovascular Disease

## 2017-02-05 NOTE — Telephone Encounter (Signed)
epicd 

## 2017-02-07 ENCOUNTER — Ambulatory Visit (INDEPENDENT_AMBULATORY_CARE_PROVIDER_SITE_OTHER): Payer: Medicare HMO | Admitting: *Deleted

## 2017-02-07 DIAGNOSIS — I5022 Chronic systolic (congestive) heart failure: Secondary | ICD-10-CM

## 2017-02-07 DIAGNOSIS — Z95810 Presence of automatic (implantable) cardiac defibrillator: Secondary | ICD-10-CM

## 2017-02-08 ENCOUNTER — Telehealth: Payer: Self-pay | Admitting: *Deleted

## 2017-02-08 DIAGNOSIS — E876 Hypokalemia: Secondary | ICD-10-CM

## 2017-02-08 NOTE — Progress Notes (Signed)
Remote ICD transmission.   

## 2017-02-08 NOTE — Telephone Encounter (Signed)
Faxed to Dr Badreddine's office via epic.

## 2017-02-08 NOTE — Telephone Encounter (Signed)
-----   Message from [name], MD sent at 01/29/2017  1:57 PM EDT ----- K very low and creatinine worsened (although probably at or close to baseline). Has he taken metolazone recently? How is his breathing, weight? Please do not take metolazone this week. Please skip one day of torsemide. I do not see any K supplement on his list. Please take 20 mEq three times a day for 2 days and recheck labs on Thursday (BMET, BNP). Please forward results to Dr. [name], [facility]

## 2017-02-08 NOTE — Telephone Encounter (Signed)
Spoke with pt wife, she reports the patient is currently taking potassium 20 meq twice daily. He had just started it when the lab work was drawn. They will return to the office tomorrow to have it rechecked.

## 2017-02-09 LAB — CUP PACEART REMOTE DEVICE CHECK
Battery Remaining Longevity: 90 mo
Brady Statistic RA Percent Paced: 5 %
Brady Statistic RV Percent Paced: 100 %
Date Time Interrogation Session: 20180829095400
HIGH POWER IMPEDANCE MEASURED VALUE: 74 Ohm
Lead Channel Impedance Value: 360 Ohm
Lead Channel Impedance Value: 623 Ohm
Lead Channel Impedance Value: 673 Ohm
Lead Channel Pacing Threshold Amplitude: 0.9 V
Lead Channel Pacing Threshold Amplitude: 1.2 V
Lead Channel Pacing Threshold Amplitude: 2 V
Lead Channel Setting Pacing Amplitude: 2.8 V
Lead Channel Setting Pacing Pulse Width: 0.4 ms
Lead Channel Setting Sensing Sensitivity: 0.6 mV
Lead Channel Setting Sensing Sensitivity: 1 mV
MDC IDC MSMT BATTERY REMAINING PERCENTAGE: 100 %
MDC IDC MSMT LEADCHNL LV PACING THRESHOLD PULSEWIDTH: 0.7 ms
MDC IDC MSMT LEADCHNL RA PACING THRESHOLD PULSEWIDTH: 0.4 ms
MDC IDC MSMT LEADCHNL RV PACING THRESHOLD PULSEWIDTH: 0.4 ms
MDC IDC PG IMPLANT DT: 20170119
MDC IDC PG SERIAL: 160713
MDC IDC SET LEADCHNL LV PACING AMPLITUDE: 3 V
MDC IDC SET LEADCHNL LV PACING PULSEWIDTH: 0.7 ms
MDC IDC SET LEADCHNL RA PACING AMPLITUDE: 2 V

## 2017-02-09 LAB — BASIC METABOLIC PANEL
BUN / CREAT RATIO: 10 (ref 10–24)
BUN: 37 mg/dL — ABNORMAL HIGH (ref 8–27)
CHLORIDE: 97 mmol/L (ref 96–106)
CO2: 24 mmol/L (ref 20–29)
Calcium: 9.3 mg/dL (ref 8.6–10.2)
Creatinine, Ser: 3.53 mg/dL — ABNORMAL HIGH (ref 0.76–1.27)
GFR calc non Af Amer: 17 mL/min/{1.73_m2} — ABNORMAL LOW (ref >59)
GFR, EST AFRICAN AMERICAN: 20 mL/min/{1.73_m2} — AB (ref >59)
GLUCOSE: 107 mg/dL — AB (ref 65–99)
POTASSIUM: 3.7 mmol/L (ref 3.5–5.2)
Sodium: 138 mmol/L (ref 134–144)

## 2017-02-14 ENCOUNTER — Telehealth: Payer: Self-pay | Admitting: Cardiovascular Disease

## 2017-02-14 NOTE — Telephone Encounter (Signed)
Received records from [facility] for appointment on 03/05/17 with Dr Sallyanne Kuster.  Records put with Dr Croitoru's schedule for 03/05/17. lp

## 2017-02-14 NOTE — Telephone Encounter (Signed)
New message    Pt wife calling to get husband blood test results. She requests call back

## 2017-02-15 NOTE — Telephone Encounter (Signed)
Notes recorded by [name], MD on 02/09/2017 at 7:11 PM EDT Potassium is better, but Creatinine worse. Needs to hold diuretic until he gains about 3-4 lb of fluid

## 2017-02-15 NOTE — Telephone Encounter (Signed)
Tried to return call-Left message-we do not have DPR to speak with a Larena Glassman.

## 2017-02-15 NOTE — Telephone Encounter (Signed)
New message   Dominic [name]  called wanted to speak with Physician or Nurse.   Explain that a messages would need to be taken or I could transfer to DOD for today.    Unable to reach someone at DOD pod - caller hung up .

## 2017-02-16 ENCOUNTER — Encounter: Payer: Self-pay | Admitting: Cardiology

## 2017-02-19 ENCOUNTER — Other Ambulatory Visit: Payer: Self-pay | Admitting: Cardiovascular Disease

## 2017-02-20 NOTE — Telephone Encounter (Signed)
REFILL 

## 2017-02-28 ENCOUNTER — Other Ambulatory Visit: Payer: Self-pay | Admitting: Cardiovascular Disease

## 2017-03-05 ENCOUNTER — Encounter: Payer: Medicare HMO | Admitting: Cardiovascular Disease

## 2017-03-29 ENCOUNTER — Telehealth: Payer: Self-pay | Admitting: Cardiovascular Disease

## 2017-03-29 DIAGNOSIS — Z79899 Other long term (current) drug therapy: Secondary | ICD-10-CM

## 2017-03-29 DIAGNOSIS — I5022 Chronic systolic (congestive) heart failure: Secondary | ICD-10-CM

## 2017-03-29 NOTE — Telephone Encounter (Signed)
New Message    Pt is following up on lab results

## 2017-03-29 NOTE — Telephone Encounter (Signed)
lmtcb

## 2017-03-30 NOTE — Telephone Encounter (Signed)
Notes recorded by [name], MD on 02/09/2017 at 7:11 PM EDT Potassium is better, but Creatinine worse. Needs to hold diuretic until he gains about 3-4 lb of fluid

## 2017-03-30 NOTE — Telephone Encounter (Signed)
Yes please repeat BMET and ask his weight MCr

## 2017-03-30 NOTE — Telephone Encounter (Signed)
Spoke with patient & wife regarding labs from February 09, 2017. They had 2 messages left to return call for results. Patient reports he has been losing weight and is taking torsemide a few times a week off and on. He states his nephrologist is "worried" about his kidneys so he also advised him to take his diuretic sparingly. He reports he does not have much of an appetite. He states he is to have repeat labs per nephrology in early November.   Worth repeating BMET prior, since these results are based on labs from 1.5 months ago?  Routed to MD

## 2017-03-30 NOTE — Telephone Encounter (Signed)
Mrs.Blakley is returning your call

## 2017-04-03 NOTE — Telephone Encounter (Signed)
lmtcb

## 2017-04-03 NOTE — Telephone Encounter (Signed)
Wife returned call. Patient is down to 225lbs as of today. Thayer Headings reports patient is due to have labs drawn (with Dr Rudean Hitt) on Thursday.  Called Dr Leighton Ruff office, spoke with Jacqlyn Larsen, they will draw BMET for [facility] and fax results to [place].  Lab ordered and faxed to [facility] attention.

## 2017-04-03 NOTE — Telephone Encounter (Signed)
Returning your call. °

## 2017-04-03 NOTE — Telephone Encounter (Signed)
LMTCB

## 2017-05-09 ENCOUNTER — Ambulatory Visit (INDEPENDENT_AMBULATORY_CARE_PROVIDER_SITE_OTHER): Payer: Medicare HMO | Admitting: *Deleted

## 2017-05-09 DIAGNOSIS — I5022 Chronic systolic (congestive) heart failure: Secondary | ICD-10-CM | POA: Diagnosis not present

## 2017-05-09 NOTE — Progress Notes (Signed)
Remote ICD transmission.   

## 2017-05-11 ENCOUNTER — Encounter: Payer: Self-pay | Admitting: Cardiology

## 2017-05-22 LAB — CUP PACEART REMOTE DEVICE CHECK
Brady Statistic RA Percent Paced: 6 %
HighPow Impedance: 71 Ohm
Lead Channel Impedance Value: 361 Ohm
Lead Channel Impedance Value: 654 Ohm
Lead Channel Impedance Value: 670 Ohm
Lead Channel Pacing Threshold Amplitude: 1.2 V
Lead Channel Setting Pacing Amplitude: 2.8 V
Lead Channel Setting Pacing Amplitude: 3 V
Lead Channel Setting Pacing Pulse Width: 0.4 ms
Lead Channel Setting Sensing Sensitivity: 0.6 mV
MDC IDC MSMT BATTERY REMAINING LONGEVITY: 90 mo
MDC IDC MSMT BATTERY REMAINING PERCENTAGE: 100 %
MDC IDC MSMT LEADCHNL LV PACING THRESHOLD AMPLITUDE: 2 V
MDC IDC MSMT LEADCHNL LV PACING THRESHOLD PULSEWIDTH: 0.7 ms
MDC IDC MSMT LEADCHNL RA PACING THRESHOLD AMPLITUDE: 0.9 V
MDC IDC MSMT LEADCHNL RA PACING THRESHOLD PULSEWIDTH: 0.4 ms
MDC IDC MSMT LEADCHNL RV PACING THRESHOLD PULSEWIDTH: 0.4 ms
MDC IDC PG IMPLANT DT: 20170119
MDC IDC SESS DTM: 20181128121100
MDC IDC SET LEADCHNL LV PACING PULSEWIDTH: 0.7 ms
MDC IDC SET LEADCHNL LV SENSING SENSITIVITY: 1 mV
MDC IDC SET LEADCHNL RA PACING AMPLITUDE: 2 V
MDC IDC STAT BRADY RV PERCENT PACED: 100 %
Pulse Gen Serial Number: 160713

## 2017-06-06 ENCOUNTER — Ambulatory Visit: Payer: Medicare HMO | Admitting: Cardiovascular Disease

## 2017-06-06 ENCOUNTER — Encounter: Payer: Self-pay | Admitting: Cardiovascular Disease

## 2017-06-06 VITALS — BP 140/62 | HR 71 | Ht 72.0 in | Wt 240.4 lb

## 2017-06-06 DIAGNOSIS — Z95810 Presence of automatic (implantable) cardiac defibrillator: Secondary | ICD-10-CM

## 2017-06-06 DIAGNOSIS — Z01810 Encounter for preprocedural cardiovascular examination: Secondary | ICD-10-CM

## 2017-06-06 DIAGNOSIS — E1169 Type 2 diabetes mellitus with other specified complication: Secondary | ICD-10-CM

## 2017-06-06 DIAGNOSIS — I872 Venous insufficiency (chronic) (peripheral): Secondary | ICD-10-CM | POA: Diagnosis not present

## 2017-06-06 DIAGNOSIS — E669 Obesity, unspecified: Secondary | ICD-10-CM

## 2017-06-06 DIAGNOSIS — Z953 Presence of xenogenic heart valve: Secondary | ICD-10-CM

## 2017-06-06 DIAGNOSIS — E782 Mixed hyperlipidemia: Secondary | ICD-10-CM

## 2017-06-06 DIAGNOSIS — I5022 Chronic systolic (congestive) heart failure: Secondary | ICD-10-CM | POA: Diagnosis not present

## 2017-06-06 DIAGNOSIS — I9789 Other postprocedural complications and disorders of the circulatory system, not elsewhere classified: Secondary | ICD-10-CM | POA: Diagnosis not present

## 2017-06-06 DIAGNOSIS — I2510 Atherosclerotic heart disease of native coronary artery without angina pectoris: Secondary | ICD-10-CM

## 2017-06-06 DIAGNOSIS — N184 Chronic kidney disease, stage 4 (severe): Secondary | ICD-10-CM | POA: Diagnosis not present

## 2017-06-06 DIAGNOSIS — J449 Chronic obstructive pulmonary disease, unspecified: Secondary | ICD-10-CM | POA: Diagnosis not present

## 2017-06-06 DIAGNOSIS — I4891 Unspecified atrial fibrillation: Secondary | ICD-10-CM

## 2017-06-06 DIAGNOSIS — I10 Essential (primary) hypertension: Secondary | ICD-10-CM

## 2017-06-06 NOTE — Patient Instructions (Signed)
Dr Sallyanne Kuster recommends that you continue on your current medications as directed. Please refer to the Current Medication list given to you today.  Remote monitoring is used to monitor your Pacemaker of ICD from home. This monitoring reduces the number of office visits required to check your device to one time per year. It allows [place] to keep an eye on the functioning of your device to ensure it is working properly. You are scheduled for a device check from home on Wednesday, February 27th, 2019. You may send your transmission at any time that day. If you have a wireless device, the transmission will be sent automatically. After your physician reviews your transmission, you will receive a postcard with your next transmission date.  Dr Sallyanne Kuster recommends that you schedule a follow-up appointment in 6 months with an ICD check. You will receive a reminder letter in the mail two months in advance. If you don't receive a letter, please call our office to schedule the follow-up appointment.  If you need a refill on your cardiac medications before your next appointment, please call your pharmacy.

## 2017-06-06 NOTE — Progress Notes (Signed)
Cardiology Office Note    Date:  06/06/2017   ID:  Dominic [name], DOB 10/02/1950, MRN [id number]  PCP:  Dominic [name]., MD  Cardiologist:   [name], MD   Chief Complaint  Patient presents with  . Follow-up  CHF, CRT-D  History of Present Illness:  Dominic [name] is a 66 y.o. 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  He is preparing for implantation of an AV fistula on June 14, 2017 with Dr. Rosana Hoes.  His renal function has continued to deteriorate. Today he weighs 240 pounds.  This is probably close to his dry weight.  He denies problems with any change in shortness of breath.  He does not have clear-cut orthopnea.  He has mild lower extremity edema, as always more prominent in his left leg compared to the right.  He continues to smoke.  When asked him how much he said "too much".  His wife also smokes.  He has 100% biventricular pacing.  He remains in normal sinus rhythm (atrial sensed, ventricular paced). He continues to take amiodarone.  The P waves are always difficult to see on surface electrocardiogram  Interrogation of his CRT-D [facility] Dynagen X4 device shows 100% biventricular pacing (LV ring 4 to can).  Continues to show evidence of improved "footprint". There has been no atrial fibrillation and no ventricular tachycardia. He has infrequent atrial pacing. Battery longevity is estimated at about 8 years.   Mr. Dominic [name] had severe ischemic cardiomyopathy with left ventricular ejection fraction estimated to be approximately 30% by echocardiogram performed in October 2016. That echo describes septal and apical akinesis with global hypokinesis. He  had evidence of restrictive filling as well as moderate right ventricular systolic dysfunction and moderate aortic insufficiency. Echo in May 2017 (after PCI and CRT) shows improved LVEF 45-50% and normal function of the mitral valve prosthesis. Right heart catheterization in May 2017 showed right atrial pressure 8, mean wedge pressure 6, cardiac index 2.2 L/minute.   He has an extensive history of coronary artery disease. He tells me that he thinks he has had 9 cardiac catheterizations and 5 stents. At least on one admission he had 2 episodes of cardiac arrest. I think this was during his hospitalization for bypass surgery/valve replacement. He underwent bypass surgery in [place] in 2015 with synchronous mitral valve replacement with a biological prosthesis. He has a history of previous stent placed in the LAD artery, type of stent and date of procedure unknown. He underwent placement of a bare metal stent to the ostium of the left circumflex coronary artery in September 2015. He subsequently underwent placement of 2 drug-eluting stents at [facility] in October 2016, placed in the right coronary artery and left circumflex coronary artery respectively. At the time of that cardiac catheterization he had 50% stenosis in the distal left main, 90% proximal LAD, 70% in-stent restenosis proximal LAD, patent LIMA to distal LAD, 80% RCA. No mention is made of his second bypass graft, which I presume is occluded. He is on combination aspirin/clopidogrel.  At the time of his bypass surgery received a 31 mm [employer] mitral bioprosthetic valve. By echocardiogram last October 2016 the mean transvalvular gradient was 5 mmHg. The same echocardiogram described  a trileaflet unrestricted aortic valve with moderate insufficiency.   His chart describes a history of postoperative atrial fibrillation. He is not on anticoagulants. He takes low-dose amiodarone.  He has a history of left bundle branch block and  he received a CRT-D [employer] device on 07/01/2015. The device and the leads are MRI conditional.  He has advanced chronic kidney disease. With recent baseline creatinine around 3.0-3.5. During his hospitalization in January 2017 the peak creatinine reached 4.6 (acute diarrheal illness), reaching a similar level in August 2017 when he had nausea and vomiting.[name] He has type 2 diabetes mellitus (not requiring insulin), complicated by kidney disease and neuropathy.  He takes atorvastatin in a relatively low dose. He has long-standing treated hypertension. He is a former smoker and has a history of COPD and takes bronchodilators. He has a history of migraine headaches treated with Fioricet. He underwent sigmoid colectomy in November 2017 for colon cancer.  Past Medical History:  Diagnosis Date  . Biventricular ICD (implantable cardioverter-defibrillator) - [facility] 09/05/2015   Jul 01, 2015 [facility] Dynagen X4  . CAD (coronary artery disease) 09/05/2015   CABG 2015 PCI-BMS ostial LCX Sept 2015 04/05/2015 cath 50% distal LM, 90% prox LAD, old stent prox LAD 70% ISR, patent LIMA-LAD, 80% prox RCA PCI-DES x2 RCA and LCX   . Chronic systolic CHF (congestive heart failure) ([place]) 09/05/2015   a. [place] 10/29/15: PCW 8, CI 2.2. Overall low filling presssures. Mean PA 20; b. echo 10/30/15: EF 45-50%, not tech suff to allwo for LV diastolic fxn, mild AI, nl appearing MVR  . CKD stage 4 due to type 2 diabetes mellitus ([place]) 09/05/2015  . Diabetes mellitus, type 2 ([place]) 09/05/2015  . Essential hypertension 09/05/2015  . History of mitral valve replacement with bioprosthetic valve 09/05/2015   31 mm Edwards 2015, Dr. Jerelene Redden  . Postoperative atrial fibrillation ([place]) 09/05/2015    Past Surgical History:  Procedure Laterality Date  . CARDIAC CATHETERIZATION N/A 10/29/2015   Procedure: Right Heart Cath;  Surgeon: Jolaine Artist, MD;  Location: [facility];  Service: Cardiovascular;  Laterality: N/A;  . CARDIAC VALVE  REPLACEMENT  2015   26mm Edwards bioprosthesis  . CORONARY ANGIOPLASTY    . CORONARY ARTERY BYPASS GRAFT  2015   [facility], Dr. Jerelene Redden.    Current Medications: Outpatient Medications Prior to Visit  Medication Sig Dispense Refill  . albuterol (PROVENTIL HFA;VENTOLIN HFA) 108 (90 Base) MCG/ACT inhaler Inhale 2 puffs into the lungs every 6 (six) hours as needed for wheezing or shortness of breath.    [name] amiodarone (PACERONE) 200 MG tablet TAKE 1 TABLET EVERY DAY 90 tablet 3  . aspirin 81 MG tablet Take 81 mg by mouth daily.    [name] atorvastatin (LIPITOR) 10 MG tablet TAKE 1 TABLET EVERY DAY 90 tablet 2  . bisacodyl (DULCOLAX) 5 MG EC tablet Take 5 mg by mouth daily as needed for moderate constipation.    . butalbital-acetaminophen-caffeine (FIORICET WITH CODEINE) 50-325-40-30 MG capsule Take 1 capsule by mouth every 4 (four) hours as needed for headache.    . Cholecalciferol (VITAMIN D HIGH POTENCY PO) Take 2,000 Units by mouth daily.    . clopidogrel (PLAVIX) 75 MG tablet Take 75 mg by mouth daily.    . DULoxetine (CYMBALTA) 60 MG capsule Take 60 mg by mouth daily.     . fluticasone (FLONASE) 50 MCG/ACT nasal spray Place 1 spray into both nostrils daily.    [name] glipiZIDE (GLUCOTROL) 2.5 mg TABS  tablet Take 2.5 mg by mouth daily before breakfast.    . glucose blood test strip 1 each by Other route as needed for other. Use as instructed    . isosorbide mononitrate (IMDUR) 60 MG 24 hr tablet Take 60 mg by mouth daily.    . metolazone (ZAROXOLYN) 2.5 MG tablet Take 1 tablet (2.5 mg total) by mouth daily as needed. For weight gain greater than 4 pounds 30 tablet 5  . metoprolol succinate (TOPROL-XL) 25 MG 24 hr tablet Take 50 mg by mouth daily.     . nitroGLYCERIN (NITROSTAT) 0.4 MG SL tablet Place 0.4 mg under the tongue every 5 (five) minutes as needed for chest pain.    [name] sucralfate (CARAFATE) 1 g tablet Take 1 g by mouth 4 (four) times daily -  with meals and at bedtime.    [name] tiotropium (SPIRIVA  HANDIHALER) 18 MCG inhalation capsule Place 1 capsule (18 mcg total) into inhaler and inhale daily. 30 capsule 11  . tiZANidine (ZANAFLEX) 2 MG tablet Take 1 tablet by mouth 2 (two) times daily as needed for pain.    [name] topiramate (TOPAMAX) 25 MG tablet Take 50 mg by mouth 2 (two) times daily.     [name] torsemide (DEMADEX) 20 MG tablet Take 3 tablets by mouth daily.    [name] umeclidinium-vilanterol (ANORO ELLIPTA) 62.5-25 MCG/INH AEPB Inhale 1 puff into the lungs daily. 1 each 5  . vitamin B-12 (CYANOCOBALAMIN) 1000 MCG tablet Take 1,000 mcg by mouth daily.    [name] torsemide (DEMADEX) 20 MG tablet TAKE 2 TABLETS BY MOUTH DAILY ONLY IF WEIGHT IS OVER 260 POUNDS. (Patient not taking: Reported on 06/06/2017) 180 tablet 11   No facility-administered medications prior to visit.      Allergies:   Hydrochlorothiazide; Amoxicillin-pot clavulanate; Clindamycin; Lisinopril; Meloxicam; Topiramate; and Tamsulosin   Social History   Socioeconomic History  . Marital status: Married    Spouse name: None  . Number of children: None  . Years of education: None  . Highest education level: None  Social Needs  . Financial resource strain: None  . Food insecurity - worry: None  . Food insecurity - inability: None  . Transportation needs - medical: None  . Transportation needs - non-medical: None  Occupational History  . None  Tobacco Use  . Smoking status: Current Some Day Smoker    Packs/day: 0.50    Years: 40.00    Pack years: 20.00  . Smokeless tobacco: Never Used  Substance and Sexual Activity  . Alcohol use: No    Alcohol/week: 0.0 oz  . Drug use: No  . Sexual activity: Not Currently  Other Topics Concern  . None  Social History Narrative  . None     Family History:  The patient's family history includes Hyperlipidemia in his father; Hypertension in his father.   ROS:   Please see the history of present illness.    ROS All other systems reviewed and are negative.   PHYSICAL EXAM:   VS:  BP  140/62   Pulse 71   Ht 6' (1.829 m)   Wt 240 lb 6.4 oz (109 kg)   BMI 32.60 kg/m     General: Alert, oriented x3, no distress, obese Head: no evidence of trauma, PERRL, EOMI, no exophtalmos or lid lag, no myxedema, no xanthelasma; normal ears, nose and oropharynx Neck: normal jugular venous pulsations and no hepatojugular reflux; brisk carotid pulses without delay and no carotid bruits Chest: clear to auscultation,  no signs of consolidation by percussion or palpation, normal fremitus, symmetrical and full respiratory excursions, healthy subclavian pacemaker site Cardiovascular: normal position and quality of the apical impulse, regular rhythm, normal first and paradoxically split second heart sounds, no murmurs, rubs or gallops Abdomen: no tenderness or distention, no masses by palpation, no abnormal pulsatility or arterial bruits, normal bowel sounds, no hepatosplenomegaly Extremities: no clubbing, cyanosis; 1+ left calf edema; 2+ radial, ulnar and brachial pulses bilaterally; 2+ right femoral, posterior tibial and dorsalis pedis pulses; 2+ left femoral, posterior tibial and dorsalis pedis pulses; no subclavian or femoral bruits Neurological: grossly nonfocal Psych: Normal mood and affect   Wt Readings from Last 3 Encounters:  06/06/17 240 lb 6.4 oz (109 kg)  08/09/16 241 lb (109.3 kg)  05/09/16 244 lb (110.7 kg)      Studies/Labs Reviewed:   EKG:  EKG is ordered today.  Atrial sensed, ventricular paced rhythm with small positive initial R wave in lead V1 and V2, QRS 178 ms, QTC 584 ms. The P waves are very hard to see on surface ECG, are confirmed by comparison with his intracardiac electrogram   Recent Labs: 06/19/2016: Brain Natriuretic Peptide 1,657.8; Magnesium 2.1 02/09/2017: BUN 37; Creatinine, Ser 3.53; Potassium 3.7; Sodium 138   Lipid Panel    Component Value Date/Time   CHOL 108 (L) 03/10/2016 0925   TRIG 228 (H) 03/10/2016 0925   HDL 28 (L) 03/10/2016 0925   CHOLHDL  3.9 03/10/2016 0925   VLDL 46 (H) 03/10/2016 0925   LDLCALC 34 03/10/2016 0925      ASSESSMENT:    1. Chronic systolic heart failure ([place])   2. Chronic obstructive pulmonary disease, unspecified COPD type ([place])   3. Coronary artery disease involving native coronary artery of native heart without angina pectoris   4. History of mitral valve replacement with bioprosthetic valve   5. Biventricular ICD (implantable cardioverter-defibrillator) in place   6. CKD (chronic kidney disease) stage 4, GFR 15-29 ml/min (HCC)   7. Essential hypertension   8. Diabetes mellitus type 2 in obese ([place])   9. Mixed hyperlipidemia   10. Postoperative atrial fibrillation (HCC)   11. Venous insufficiency   12. Preoperative cardiovascular examination      PLAN:  In order of problems listed above:  1. CHF: Chronic class II exertional dyspnea, at baseline.  His weight is also close with the previously estimated to be his dry weight.  No change in heart failure medications made. 2. COPD: Currently dyspnea is primarily due to lung disease. the PFT pattern and the response to bronchodilator suggests obstructive lung disease is the bigger problem. Repeat PFTs yearly for amiodarone treatment monitoring.  We will plan to repeat PFTs, but I think we could delay that until after he has his AV fistula placed.  Strongly encouraged both Mr. Probert and his wife to stop smoking. 3. CAD: He received 2 drug-eluting stents in October 2016, he does not have angina pectoris.  Poor candidate for angiography until he starts dialysis.  I think stopping clopidogrel temporarily for his AV fistula will be a low risk proposition. 4. S/p MVR: 31 mm bioprosthesis with normal function by recent echocardiogram. Mitral gradient was lower on the echo performed recently compared to the echo from [place]. Avoid tachycardia. 5. CRT-D: Normal function of his biventricular defibrillator. 100% biventricular pacing. No atrial or ventricular arrhythmia  detected. His device is MRI conditional.  6. CKD stage 4: For this reason he is not on renin-angiotensin-aldosterone system inhibitors.  Planning to have an AV fistula placed 7. HTN: Fair control 8. DM: Well controlled, complicated by chronic kidney disease 9. HLP: Excellent LDL level, other lipid parameters consistent with insulin resistance  10. AFib:  just a postoperative event and for this reason he was not started on anticoagulation. None has been detected since implantation of his defibrillator in January. Continue amiodarone for now, since he seems to be at very high risk for arrhythmia recurrence. Check liver function tests and TSH at least twice yearly.  Could consider an attempt to wean off amiodarone after he has his fistula surgery. 11. Peripheral venous insufficiency: Despite only minimally elevated right atrial pressures at cardiac catheterization, he had prominent persistent leg edema, L>R. This is consistent with substantial venous insufficiency. Has only mild edema now. Keep legs elevated. Recommend compression stockings. 12. Preop eval: Due to his many and serious medical problems, his risk of perioperative complications is low-to-moderate.  However I do not think there are any particular investigations or interventions that would be beneficial prior to the AV fistula surgery    Medication Adjustments/Labs and Tests Ordered: Current medicines are reviewed at length with the patient today.  Concerns regarding medicines are outlined above.  Medication changes, Labs and Tests ordered today are listed in the Patient Instructions below. Patient Instructions  Dr Sallyanne Kuster recommends that you continue on your current medications as directed. Please refer to the Current Medication list given to you today.  Remote monitoring is used to monitor your Pacemaker of ICD from home. This monitoring reduces the number of office visits required to check your device to one time per year. It allows [place]  to keep an eye on the functioning of your device to ensure it is working properly. You are scheduled for a device check from home on Wednesday, February 27th, 2019. You may send your transmission at any time that day. If you have a wireless device, the transmission will be sent automatically. After your physician reviews your transmission, you will receive a postcard with your next transmission date.  Dr Sallyanne Kuster recommends that you schedule a follow-up appointment in 6 months with an ICD check. You will receive a reminder letter in the mail two months in advance. If you don't receive a letter, please call our office to schedule the follow-up appointment.  If you need a refill on your cardiac medications before your next appointment, please call your pharmacy.    Signed, [name], MD  06/06/2017 10:57 PM    [address], [place], [place]  [address] Phone: [phone]; Fax: [phone]

## 2017-07-03 ENCOUNTER — Telehealth: Payer: Self-pay | Admitting: Cardiovascular Disease

## 2017-07-03 MED ORDER — METOLAZONE 2.5 MG PO TABS: 2.5000 mg | ORAL_TABLET | Freq: Every day | ORAL | 1 refills | Status: DC | PRN

## 2017-07-03 NOTE — Telephone Encounter (Signed)
°*  STAT* If patient is at the pharmacy, call can be transferred to refill team.   1. Which medications need to be refilled? (please list name of each medication and dose if known) metolazone (ZAROXOLYN) 2.5 MG tablet  2. Which pharmacy/location (including street and city if local pharmacy) is medication to be sent to? [facility] [address] [place]. Do they need a 30 day or 90 day supply? [place]

## 2017-07-03 NOTE — Telephone Encounter (Signed)
Rx(s) sent to pharmacy electronically.  

## 2017-07-23 LAB — CUP PACEART INCLINIC DEVICE CHECK
Implantable Pulse Generator Implant Date: 20170119
MDC IDC SESS DTM: 20190211173744
Pulse Gen Serial Number: 160713

## 2017-08-08 ENCOUNTER — Ambulatory Visit (INDEPENDENT_AMBULATORY_CARE_PROVIDER_SITE_OTHER): Payer: Medicare HMO | Admitting: *Deleted

## 2017-08-08 DIAGNOSIS — I5022 Chronic systolic (congestive) heart failure: Secondary | ICD-10-CM | POA: Diagnosis not present

## 2017-08-08 NOTE — Progress Notes (Signed)
Remote ICD transmission.   

## 2017-08-09 ENCOUNTER — Encounter: Payer: Self-pay | Admitting: Cardiology

## 2017-08-24 LAB — CUP PACEART REMOTE DEVICE CHECK
Battery Remaining Longevity: 96 mo
Brady Statistic RA Percent Paced: 9 %
Date Time Interrogation Session: 20190227105200
HIGH POWER IMPEDANCE MEASURED VALUE: 70 Ohm
Implantable Pulse Generator Implant Date: 20170119
Lead Channel Impedance Value: 657 Ohm
Lead Channel Impedance Value: 693 Ohm
Lead Channel Pacing Threshold Amplitude: 1.1 V
Lead Channel Pacing Threshold Amplitude: 1.2 V
Lead Channel Pacing Threshold Pulse Width: 0.7 ms
Lead Channel Setting Sensing Sensitivity: 0.6 mV
Lead Channel Setting Sensing Sensitivity: 1 mV
MDC IDC MSMT BATTERY REMAINING PERCENTAGE: 100 %
MDC IDC MSMT LEADCHNL RA PACING THRESHOLD AMPLITUDE: 0.9 V
MDC IDC MSMT LEADCHNL RA PACING THRESHOLD PULSEWIDTH: 0.4 ms
MDC IDC MSMT LEADCHNL RV IMPEDANCE VALUE: 343 Ohm
MDC IDC MSMT LEADCHNL RV PACING THRESHOLD PULSEWIDTH: 0.4 ms
MDC IDC PG SERIAL: 160713
MDC IDC SET LEADCHNL LV PACING AMPLITUDE: 2.6 V
MDC IDC SET LEADCHNL LV PACING PULSEWIDTH: 0.7 ms
MDC IDC SET LEADCHNL RA PACING AMPLITUDE: 2 V
MDC IDC SET LEADCHNL RV PACING AMPLITUDE: 2.8 V
MDC IDC SET LEADCHNL RV PACING PULSEWIDTH: 0.4 ms
MDC IDC STAT BRADY RV PERCENT PACED: 100 %

## 2017-10-04 LAB — HM DIABETES EYE EXAM

## 2017-10-09 ENCOUNTER — Telehealth: Payer: Self-pay

## 2017-10-09 ENCOUNTER — Other Ambulatory Visit: Payer: Self-pay

## 2017-10-09 DIAGNOSIS — H349 Unspecified retinal vascular occlusion: Secondary | ICD-10-CM

## 2017-10-09 NOTE — Telephone Encounter (Signed)
Received records from ophthalmologist. Note indicates partial retinal artery occlusion. Per MCr: - No Afib on ICD - He has a hx of postop afib. Consider warfarin anticoagulation d/t near ESRD to discuss at office visit.  - Please sch for bilat carotid duplex r/t retinal artery occlusion.  Patient is due to see Dr Loletha Grayer in June. Called patient to sch. Lmtcb. Duplex ordered. Message routed to admin for scheduling.

## 2017-10-09 NOTE — Telephone Encounter (Signed)
Opened in error

## 2017-10-10 ENCOUNTER — Other Ambulatory Visit: Payer: Self-pay | Admitting: Cardiovascular Disease

## 2017-10-11 ENCOUNTER — Ambulatory Visit (HOSPITAL_COMMUNITY)
Admission: RE | Admit: 2017-10-11 | Discharge: 2017-10-11 | Disposition: A | Discharge status: Home / Self Care | Payer: Medicare HMO | Source: Ambulatory Visit | Attending: Cardiovascular Disease | Admitting: Cardiovascular Disease

## 2017-10-11 DIAGNOSIS — H349 Unspecified retinal vascular occlusion: Secondary | ICD-10-CM | POA: Diagnosis not present

## 2017-10-11 DIAGNOSIS — H439 Unspecified disorder of vitreous body: Secondary | ICD-10-CM | POA: Diagnosis not present

## 2017-10-15 ENCOUNTER — Telehealth: Payer: Self-pay

## 2017-10-15 NOTE — Telephone Encounter (Signed)
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  Please check his device early and see if he has had any atrial fibrillation in the last couple of months.

## 2017-10-15 NOTE — Telephone Encounter (Signed)
Spoke with patient and walked him through how to send a remote transmission. Patient unable to successfully send. Tech services number given for support.

## 2017-10-16 NOTE — Telephone Encounter (Signed)
10/15/17 remote reviewed. No AF recorded. Information forwarded to Dr.Croitoru.

## 2017-10-22 ENCOUNTER — Other Ambulatory Visit: Payer: Self-pay | Admitting: Cardiovascular Disease

## 2017-11-07 ENCOUNTER — Ambulatory Visit (INDEPENDENT_AMBULATORY_CARE_PROVIDER_SITE_OTHER): Payer: Medicare HMO | Admitting: *Deleted

## 2017-11-07 DIAGNOSIS — I5022 Chronic systolic (congestive) heart failure: Secondary | ICD-10-CM | POA: Diagnosis not present

## 2017-11-08 ENCOUNTER — Ambulatory Visit (INDEPENDENT_AMBULATORY_CARE_PROVIDER_SITE_OTHER): Payer: Medicare HMO | Admitting: Cardiovascular Disease

## 2017-11-08 ENCOUNTER — Encounter: Payer: Self-pay | Admitting: Cardiovascular Disease

## 2017-11-08 VITALS — BP 112/60 | HR 67 | Ht 72.0 in | Wt 243.4 lb

## 2017-11-08 DIAGNOSIS — I5042 Chronic combined systolic (congestive) and diastolic (congestive) heart failure: Secondary | ICD-10-CM

## 2017-11-08 DIAGNOSIS — E782 Mixed hyperlipidemia: Secondary | ICD-10-CM

## 2017-11-08 DIAGNOSIS — Z953 Presence of xenogenic heart valve: Secondary | ICD-10-CM | POA: Diagnosis not present

## 2017-11-08 DIAGNOSIS — I10 Essential (primary) hypertension: Secondary | ICD-10-CM

## 2017-11-08 DIAGNOSIS — J432 Centrilobular emphysema: Secondary | ICD-10-CM | POA: Diagnosis not present

## 2017-11-08 DIAGNOSIS — I9789 Other postprocedural complications and disorders of the circulatory system, not elsewhere classified: Secondary | ICD-10-CM

## 2017-11-08 DIAGNOSIS — I2510 Atherosclerotic heart disease of native coronary artery without angina pectoris: Secondary | ICD-10-CM | POA: Diagnosis not present

## 2017-11-08 DIAGNOSIS — Z79899 Other long term (current) drug therapy: Secondary | ICD-10-CM | POA: Diagnosis not present

## 2017-11-08 DIAGNOSIS — Z95810 Presence of automatic (implantable) cardiac defibrillator: Secondary | ICD-10-CM | POA: Diagnosis not present

## 2017-11-08 DIAGNOSIS — I872 Venous insufficiency (chronic) (peripheral): Secondary | ICD-10-CM | POA: Diagnosis not present

## 2017-11-08 DIAGNOSIS — N184 Chronic kidney disease, stage 4 (severe): Secondary | ICD-10-CM

## 2017-11-08 DIAGNOSIS — I4891 Unspecified atrial fibrillation: Secondary | ICD-10-CM

## 2017-11-08 DIAGNOSIS — E1122 Type 2 diabetes mellitus with diabetic chronic kidney disease: Secondary | ICD-10-CM

## 2017-11-08 NOTE — Progress Notes (Signed)
`   Cardiology Office Note    Date:  11/10/2017   ID:  Dominic [name], DOB [birth year], MRN [id number]  PCP:  Frederic Jericho Estevan Ryder., MD  Cardiologist:   [name], MD   No chief complaint on file. CHF, CRT-D  History of Present Illness:  Dominic [name] is a 67 y.o. 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  Since his last appointment he successfully underwent placement of AV fistula in his right forearm.  It has healed nicely and appears to be maturing well.  He does not yet required hemodialysis.  His weight has been stable.  He generally feels well and has NYHA class II dyspnea.  He can do most tasks as long as he keeps a slower pace.  He has been doing some carpentry work on his porch.  He has not had problems with edema, cough, wheezing, orthopnea, PND, angina pectoris, claudication or new focal neurological complaints.  He continues to smoke. His wife also smokes.  Neither one of them seems to be inclined to quit.  He has 100% biventricular pacing and only 6% atrial pacing.  No atrial fibrillation has been detected. the P waves are always difficult to see on surface electrocardiogram, today he is clinically in sinus rhythm,atrial sensed biV paced, positive R waves in lead V1 and a very narrow QRS at 118 ms.  He has not had any episodes of sustained or nonsustained VT and estimated generator longevity is still about 7-8 years.  CRT-D BSC Dynagen X4 device, LV pacing vector is LV ring 4 to can  Dominic [name] had severe ischemic cardiomyopathy with left ventricular ejection fraction estimated to be approximately 30% by echocardiogram performed in October 2016. That  echo describes septal and apical akinesis with global hypokinesis. He had evidence of restrictive filling as well as moderate right ventricular systolic dysfunction and moderate aortic insufficiency. Echo in May 2017 (after PCI and CRT) shows improved LVEF 45-50% and normal function of the mitral valve prosthesis. Right heart catheterization in May 2017 showed right atrial pressure 8, mean wedge pressure 6, cardiac index 2.2 L/minute.   He has an extensive history of coronary artery disease. He tells me that he thinks he has had 9 cardiac catheterizations and 5 stents. At least on one admission he had 2 episodes of cardiac arrest. I think this was during his hospitalization for bypass surgery/valve replacement. He underwent bypass surgery in [place] in 2015 with synchronous mitral valve replacement with a biological prosthesis. He has a history of previous stent placed in the LAD artery, type of stent and date of procedure unknown. He underwent placement of a bare metal stent to the ostium of the left circumflex coronary artery in September 2015. He subsequently underwent placement of 2 drug-eluting stents at [facility] in October 2016, placed in the right coronary artery and left circumflex coronary artery respectively. At the time of that cardiac catheterization he had 50% stenosis in the distal left main, 90% proximal LAD, 70% in-stent restenosis proximal LAD, patent LIMA to distal LAD, 80% RCA. No mention is made of his second bypass graft, which I presume is occluded. He is on combination aspirin/clopidogrel.  At the time of his bypass surgery received a 31 mm  [employer] mitral bioprosthetic valve. By echocardiogram last October 2016 the mean transvalvular gradient was 5 mmHg. The same echocardiogram described a trileaflet unrestricted aortic valve with moderate insufficiency.   His chart describes a history of postoperative atrial fibrillation. He is not on anticoagulants. He takes  low-dose amiodarone.  He has a history of left bundle branch block and he received a CRT-D [employer] device on 07/01/2015. The device and the leads are MRI conditional.  He has advanced chronic kidney disease. With recent baseline creatinine around 3.0-3.5. During his hospitalization in January 2017 the peak creatinine reached 4.6 (acute diarrheal illness), reaching a similar level in August 2017 when he had nausea and vomiting.[name] He has type 2 diabetes mellitus (not requiring insulin), complicated by kidney disease and neuropathy.  He takes atorvastatin in a relatively low dose. He has long-standing treated hypertension. He is a former smoker and has a history of COPD and takes bronchodilators. He has a history of migraine headaches treated with Fioricet. He underwent sigmoid colectomy in November 2017 for colon cancer.  Past Medical History:  Diagnosis Date  . Biventricular ICD (implantable cardioverter-defibrillator) - [facility] 09/05/2015   Jul 01, 2015 [facility] Dynagen X4  . CAD (coronary artery disease) 09/05/2015   CABG 2015 PCI-BMS ostial LCX Sept 2015 04/05/2015 cath 50% distal LM, 90% prox LAD, old stent prox LAD 70% ISR, patent LIMA-LAD, 80% prox RCA PCI-DES x2 RCA and LCX   . Chronic systolic CHF (congestive heart failure) ([place]) 09/05/2015   a. [place] 10/29/15: PCW 8, CI 2.2. Overall low filling presssures. Mean PA 20; b. echo 10/30/15: EF 45-50%, not tech suff to allwo for LV diastolic fxn, mild AI, nl appearing MVR  . CKD stage 4 due to type 2 diabetes mellitus ([place]) 09/05/2015  . Diabetes mellitus, type 2 ([place]) 09/05/2015  . Essential hypertension 09/05/2015  . History of mitral valve replacement with bioprosthetic valve 09/05/2015   31 mm Edwards 2015, Dr. Jerelene Redden  . Postoperative atrial fibrillation ([place]) 09/05/2015    Past Surgical History:  Procedure Laterality Date  . CARDIAC CATHETERIZATION N/A 10/29/2015   Procedure: Right Heart Cath;  Surgeon: Jolaine Artist, MD;  Location: [place] [facility];  Service: Cardiovascular;  Laterality: N/A;  . CARDIAC VALVE REPLACEMENT  2015   45mm Edwards bioprosthesis  . CORONARY ANGIOPLASTY    . CORONARY ARTERY BYPASS GRAFT  2015   [facility], Dr. Jerelene Redden.    Current Medications: Outpatient Medications Prior to Visit  Medication Sig Dispense Refill  . albuterol (PROVENTIL HFA;VENTOLIN HFA) 108 (90 Base) MCG/ACT inhaler Inhale 2 puffs into the lungs every 6 (six) hours as needed for wheezing or shortness of breath.    [name] amiodarone (PACERONE) 200 MG tablet TAKE 1 TABLET EVERY DAY 90 tablet 0  . aspirin 81 MG tablet Take 81 mg by mouth daily.    [name] atorvastatin (LIPITOR) 10 MG tablet TAKE 1 TABLET EVERY DAY 90 tablet 2  . bisacodyl (DULCOLAX) 5 MG EC tablet Take 5 mg by mouth daily as needed for moderate constipation.    . butalbital-acetaminophen-caffeine (FIORICET WITH CODEINE) 50-325-40-30 MG capsule Take 1 capsule by mouth every 4 (four) hours as needed for headache.    . Cholecalciferol (VITAMIN D HIGH POTENCY PO) Take 2,000 Units by mouth daily.    . clopidogrel (PLAVIX) 75 MG tablet Take 75 mg by mouth daily.    . DULoxetine (CYMBALTA) 60 MG capsule Take 60 mg by mouth daily.     [name]  fluticasone (FLONASE) 50 MCG/ACT nasal spray Place 1 spray into both nostrils daily.    [name] glipiZIDE (GLUCOTROL) 2.5 mg TABS tablet Take 2.5 mg by mouth daily before breakfast.    . glucose blood test strip 1 each by Other route as needed for other. Use as instructed    . isosorbide mononitrate (IMDUR) 60 MG 24 hr tablet Take 60 mg by mouth daily.    . metolazone (ZAROXOLYN) 2.5 MG tablet Take 1 tablet (2.5 mg total) by mouth daily as needed. For weight gain greater than 4 pounds 90 tablet 1  . metoprolol succinate (TOPROL-XL) 25 MG 24 hr tablet Take 50 mg by mouth daily.     . nitroGLYCERIN (NITROSTAT) 0.4 MG SL tablet Place 0.4 mg under the tongue every 5 (five) minutes as needed for chest pain.    [name] sucralfate (CARAFATE) 1 g tablet Take 1 g by mouth 4  (four) times daily -  with meals and at bedtime.    [name] tiotropium (SPIRIVA HANDIHALER) 18 MCG inhalation capsule Place 1 capsule (18 mcg total) into inhaler and inhale daily. 30 capsule 11  . tiZANidine (ZANAFLEX) 2 MG tablet Take 1 tablet by mouth 2 (two) times daily as needed for pain.    [name] topiramate (TOPAMAX) 25 MG tablet Take 50 mg by mouth 2 (two) times daily.     [name] torsemide (DEMADEX) 20 MG tablet Take 3 tablets by mouth daily.    [name] umeclidinium-vilanterol (ANORO ELLIPTA) 62.5-25 MCG/INH AEPB Inhale 1 puff into the lungs daily. 1 each 5  . vitamin B-12 (CYANOCOBALAMIN) 1000 MCG tablet Take 1,000 mcg by mouth daily.     No facility-administered medications prior to visit.      Allergies:   Hydrochlorothiazide; Amoxicillin-pot clavulanate; Clindamycin; Lisinopril; Meloxicam; Topiramate; and Tamsulosin   Social History   Socioeconomic History  . Marital status: Married    Spouse name: Not on file  . Number of children: Not on file  . Years of education: Not on file  . Highest education level: Not on file  Occupational History  . Not on file  Social Needs  . Financial resource strain: Not on file  . Food insecurity:    Worry: Not on file    Inability: Not on file  . Transportation needs:    Medical: Not on file    Non-medical: Not on file  Tobacco Use  . Smoking status: Current Some Day Smoker    Packs/day: 0.50    Years: 40.00    Pack years: 20.00  . Smokeless tobacco: Never Used  Substance and Sexual Activity  . Alcohol use: No    Alcohol/week: 0.0 oz  . Drug use: No  . Sexual activity: Not Currently  Lifestyle  . Physical activity:    Days per week: Not on file    Minutes per session: Not on file  . Stress: Not on file  Relationships  . Social connections:    Talks on phone: Not on file    Gets together: Not on file    Attends religious service: Not on file    Active member of club or organization: Not on file    Attends meetings of clubs or organizations:  Not on file    Relationship status: Not on file  Other Topics Concern  . Not on file  Social History Narrative  . Not on file     Family History:  The patient's family history includes Hyperlipidemia in his father; Hypertension in his father.  ROS:   Please see the history of present illness.    ROS All other systems reviewed and are negative.   PHYSICAL EXAM:   VS:  BP 112/60   Pulse 67   Ht 6' (1.829 m)   Wt 243 lb 6.4 oz (110.4 kg)   BMI 33.01 kg/m     General: Alert, oriented x3, no distress, obese; healthy left subclavian defibrillator site Head: no evidence of trauma, PERRL, EOMI, no exophtalmos or lid lag, no myxedema, no xanthelasma; normal ears, nose and oropharynx Neck: normal jugular venous pulsations and no hepatojugular reflux; brisk carotid pulses without delay and no carotid bruits Chest: clear to auscultation, no signs of consolidation by percussion or palpation, normal fremitus, symmetrical and full respiratory excursions Cardiovascular: normal position and quality of the apical impulse, regular rhythm, normal first and second heart sounds, no murmurs, rubs or gallops Abdomen: no tenderness or distention, no masses by palpation, no abnormal pulsatility or arterial bruits, normal bowel sounds, no hepatosplenomegaly Extremities: no clubbing, cyanosis; 1+ ankle edema, worse on left; 2+ radial, ulnar and brachial pulses bilaterally; 2+ right femoral, posterior tibial and dorsalis pedis pulses; 2+ left femoral, posterior tibial and dorsalis pedis pulses; no subclavian or femoral bruits Neurological: grossly nonfocal Psych: Normal mood and affect   Wt Readings from Last 3 Encounters:  11/08/17 243 lb 6.4 oz (110.4 kg)  06/06/17 240 lb 6.4 oz (109 kg)  08/09/16 241 lb (109.3 kg)      Studies/Labs Reviewed:   EKG:  EKG is ordered today.  Atrial sensed biventricular paced rhythm with positive R waves in V1.  QTc 497 ms Recent Labs: 02/09/2017: BUN 37; Creatinine,  Ser 3.53; Potassium 3.7; Sodium 138   Lipid Panel    Component Value Date/Time   CHOL 108 (L) 03/10/2016 0925   TRIG 228 (H) 03/10/2016 0925   HDL 28 (L) 03/10/2016 0925   CHOLHDL 3.9 03/10/2016 0925   VLDL 46 (H) 03/10/2016 0925   LDLCALC 34 03/10/2016 0925      ASSESSMENT:    1. Chronic combined systolic (congestive) and diastolic (congestive) heart failure ([place])   2. Centrilobular emphysema ([place])   3. Coronary artery disease involving native coronary artery of native heart without angina pectoris   4. History of mitral valve replacement with bioprosthetic valve   5. Biventricular ICD (implantable cardioverter-defibrillator) - [facility]   6. CKD (chronic kidney disease) stage 4, GFR 15-29 ml/min (HCC)   7. Essential hypertension   8. Diabetes mellitus with stage 4 chronic kidney disease GFR 15-29 ([place])   9. Mixed hyperlipidemia   10. Postoperative atrial fibrillation (HCC)   11. 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 CHF: Chronic class II exertional dyspnea, at baseline.  His weight is also close with the previously estimated to be his dry weight, around 240 pounds.  Appears clinically euvolemic.  No change in heart failure medications made. 2. COPD: Currently dyspnea is primarily due to lung disease. the PFT pattern and the response to bronchodilator suggests obstructive lung disease is the bigger problem. Again, I encouraged both Dominic [name] and his wife to stop smoking.  Repeat PFTs for amiodarone monitoring. 3. CAD: Asymptomatic since placement of stents in 2016.  Poor candidate for angiography until he starts dialysis.   4. S/p MVR: 31 mm bioprosthesis with normal function by May 2017 echocardiogram.  Avoid tachycardia.  He is at risk for  early prosthesis degeneration due to kidney disease.  Reminded him of the need for endocarditis prophylaxis. 5. CRT-D: Normal function of his biventricular defibrillator.  100% biventricular pacing. No atrial or ventricular arrhythmia detected. His device is MRI conditional.  Continue remote downloads every 3 months and office visits 6. CKD stage 4: For this reason he is not on renin-angiotensin-aldosterone system inhibitors.  Fistula is maturing well but he does not require dialysis yet.  Nephrologist is Dr. [name].  Creatinine was 3.95 on May 18, potassium 3.1 7. HTN: Excellent control 8. DM: Reports good control.  Most recent hemoglobin A1c was only 5.8%. 9. HLP: Excellent LDL level on statin, other lipid parameters consistent with insulin resistance  10. AFib: Not on anticoagulation since this arrhythmia has not recurred since open heart surgery.  Remains on amiodarone since he felt to get high risk for arrhythmia recurrence.  Atrial fibrillation would be deleterious in the setting of a mitral bioprosthesis and trigger heart failure exacerbation as well as inappropriate defibrillator shocks.   11. Amiodarone: LFTs and thyroid tests twice yearly while on amiodarone.  He complains of blood work with his nephrologist in the near future and I gave him lab slips for the necessary tests. 12. Peripheral venous insufficiency: Mild residual edema at this time.  Recommended keeping legs elevated and wearing compression stockings..     Medication Adjustments/Labs and Tests Ordered: Current medicines are reviewed at length with the patient today.  Concerns regarding medicines are outlined above.  Medication changes, Labs and Tests ordered today are listed in the Patient Instructions below. Patient Instructions  Dr Sallyanne Kuster recommends that you continue on your current medications as directed. Please refer to the Current Medication list given to you today.  Your physician recommends that you return for lab work at your convenience.  Remote monitoring is used to monitor your Pacemaker or ICD from home. This monitoring reduces the number of office visits required to check  your device to one time per year. It allows [place] to keep an eye on the functioning of your device to ensure it is working properly. You are scheduled for a device check from home on Thursday, August 29th, 2019. You may send your transmission at any time that day. If you have a wireless device, the transmission will be sent automatically. After your physician reviews your transmission, you will receive a notification with your next transmission date.  To improve our patient care and to more adequately follow your device, [facility] has decided, as a practice, to start following each patient four times a year with your home monitor. This means that you may experience a remote appointment that is close to an in-office appointment with your physician. Your insurance will apply at the same rate as other remote monitoring transmissions.  Dr Sallyanne Kuster recommends that you schedule a follow-up appointment in 12 months with an ICD check. You will receive a reminder letter in the mail two months in advance. If you don't receive a letter, please call our office to schedule the follow-up appointment.  If you need a refill on your cardiac medications before your next appointment, please call your pharmacy.    Signed, [name], MD  11/10/2017 2:22 PM    [facility] [address], [place], [place]  [address] Phone: [phone]; Fax: [phone]

## 2017-11-08 NOTE — Patient Instructions (Signed)
Dr Sallyanne Kuster recommends that you continue on your current medications as directed. Please refer to the Current Medication list given to you today.  Your physician recommends that you return for lab work at your convenience.  Remote monitoring is used to monitor your Pacemaker or ICD from home. This monitoring reduces the number of office visits required to check your device to one time per year. It allows [place] to keep an eye on the functioning of your device to ensure it is working properly. You are scheduled for a device check from home on Thursday, August 29th, 2019. You may send your transmission at any time that day. If you have a wireless device, the transmission will be sent automatically. After your physician reviews your transmission, you will receive a notification with your next transmission date.  To improve our patient care and to more adequately follow your device, [facility] has decided, as a practice, to start following each patient four times a year with your home monitor. This means that you may experience a remote appointment that is close to an in-office appointment with your physician. Your insurance will apply at the same rate as other remote monitoring transmissions.  Dr Sallyanne Kuster recommends that you schedule a follow-up appointment in 12 months with an ICD check. You will receive a reminder letter in the mail two months in advance. If you don't receive a letter, please call our office to schedule the follow-up appointment.  If you need a refill on your cardiac medications before your next appointment, please call your pharmacy.

## 2017-11-09 NOTE — Progress Notes (Signed)
letter

## 2017-11-09 NOTE — Progress Notes (Signed)
Remote ICD transmission.   

## 2017-11-10 DIAGNOSIS — J432 Centrilobular emphysema: Secondary | ICD-10-CM | POA: Insufficient documentation

## 2017-11-13 LAB — CUP PACEART REMOTE DEVICE CHECK
Battery Remaining Percentage: 100 %
Brady Statistic RA Percent Paced: 6 %
Date Time Interrogation Session: 20190529135800
HighPow Impedance: 74 Ohm
Implantable Pulse Generator Implant Date: 20170119
Lead Channel Impedance Value: 723 Ohm
Lead Channel Impedance Value: 812 Ohm
Lead Channel Pacing Threshold Pulse Width: 0.7 ms
Lead Channel Setting Pacing Amplitude: 2 V
Lead Channel Setting Pacing Amplitude: 2.6 V
Lead Channel Setting Pacing Pulse Width: 0.4 ms
Lead Channel Setting Sensing Sensitivity: 0.6 mV
Lead Channel Setting Sensing Sensitivity: 1 mV
MDC IDC MSMT BATTERY REMAINING LONGEVITY: 90 mo
MDC IDC MSMT LEADCHNL LV PACING THRESHOLD AMPLITUDE: 1.1 V
MDC IDC MSMT LEADCHNL RA PACING THRESHOLD AMPLITUDE: 0.9 V
MDC IDC MSMT LEADCHNL RA PACING THRESHOLD PULSEWIDTH: 0.4 ms
MDC IDC MSMT LEADCHNL RV IMPEDANCE VALUE: 355 Ohm
MDC IDC MSMT LEADCHNL RV PACING THRESHOLD AMPLITUDE: 1.2 V
MDC IDC MSMT LEADCHNL RV PACING THRESHOLD PULSEWIDTH: 0.4 ms
MDC IDC PG SERIAL: 160713
MDC IDC SET LEADCHNL LV PACING PULSEWIDTH: 0.7 ms
MDC IDC SET LEADCHNL RV PACING AMPLITUDE: 2.8 V
MDC IDC STAT BRADY RV PERCENT PACED: 100 %

## 2017-11-20 ENCOUNTER — Telehealth: Payer: Self-pay

## 2017-11-20 DIAGNOSIS — J449 Chronic obstructive pulmonary disease, unspecified: Secondary | ICD-10-CM

## 2017-11-20 DIAGNOSIS — Z79899 Other long term (current) drug therapy: Secondary | ICD-10-CM

## 2017-11-20 NOTE — Telephone Encounter (Signed)
Croitoru, Dani Gobble, MD  Nadezhda Pollitt, Dionne Bucy, CMA        Not urgent, but I would like Mr. Quintanar to have repeat PFTs with DLCO (diffusion capacity) for amiodarone monitoring. Does not need post bronchodilator PFT.  MCr

## 2017-12-14 LAB — CUP PACEART INCLINIC DEVICE CHECK
Implantable Pulse Generator Implant Date: 20170119
Lead Channel Setting Pacing Amplitude: 2 V
Lead Channel Setting Sensing Sensitivity: 0.6 mV
MDC IDC SESS DTM: 20190705160646
MDC IDC SET LEADCHNL LV PACING AMPLITUDE: 2.6 V
MDC IDC SET LEADCHNL LV PACING PULSEWIDTH: 0.7 ms
MDC IDC SET LEADCHNL LV SENSING SENSITIVITY: 1 mV
MDC IDC SET LEADCHNL RV PACING AMPLITUDE: 2.8 V
MDC IDC SET LEADCHNL RV PACING PULSEWIDTH: 0.4 ms
Pulse Gen Serial Number: 160713

## 2017-12-24 ENCOUNTER — Other Ambulatory Visit: Payer: Self-pay | Admitting: Cardiovascular Disease

## 2017-12-24 MED ORDER — AMIODARONE HCL 200 MG PO TABS: 200.0000 mg | ORAL_TABLET | Freq: Every day | ORAL | 2 refills | Status: DC

## 2017-12-24 NOTE — Telephone Encounter (Signed)
Rx(s) sent to pharmacy electronically.  

## 2017-12-24 NOTE — Telephone Encounter (Signed)
°*  STAT* If patient is at the pharmacy, call can be transferred to refill team.   1. Which medications need to be refilled? (please list name of each medication and dose if known)  Pt changing back to [facility]- needs a new prescription for  Amiodarone  2. Which pharmacy/location (including street and city if local pharmacy) is medication to be sent to?[facility] [phone] 3. Do they need a 30 day or 90 day supply? 90 and refills

## 2017-12-31 NOTE — Telephone Encounter (Signed)
Ordered. Line busy.  Message forwarded to admin pool for scheduling.

## 2018-01-04 LAB — COMPREHENSIVE METABOLIC PANEL
ALBUMIN: 3.8 g/dL (ref 3.6–4.8)
ALT: 15 IU/L (ref 0–44)
AST: 21 IU/L (ref 0–40)
Albumin/Globulin Ratio: 1.2 (ref 1.2–2.2)
Alkaline Phosphatase: 116 IU/L (ref 39–117)
BUN/Creatinine Ratio: 17 (ref 10–24)
BUN: 58 mg/dL — AB (ref 8–27)
Bilirubin Total: 0.3 mg/dL (ref 0.0–1.2)
CHLORIDE: 89 mmol/L — AB (ref 96–106)
CO2: 25 mmol/L (ref 20–29)
CREATININE: 3.44 mg/dL — AB (ref 0.76–1.27)
Calcium: 9.1 mg/dL (ref 8.6–10.2)
GFR calc non Af Amer: 18 mL/min/{1.73_m2} — ABNORMAL LOW (ref >59)
GFR, EST AFRICAN AMERICAN: 20 mL/min/{1.73_m2} — AB (ref >59)
GLUCOSE: 116 mg/dL — AB (ref 65–99)
Globulin, Total: 3.3 g/dL (ref 1.5–4.5)
Potassium: 3.4 mmol/L — ABNORMAL LOW (ref 3.5–5.2)
Sodium: 135 mmol/L (ref 134–144)
Total Protein: 7.1 g/dL (ref 6.0–8.5)

## 2018-01-04 LAB — TSH: TSH: 2.65 u[IU]/mL (ref 0.450–4.500)

## 2018-01-07 ENCOUNTER — Telehealth: Payer: Self-pay | Admitting: Cardiovascular Disease

## 2018-01-07 MED ORDER — AMIODARONE HCL 200 MG PO TABS: 200.0000 mg | ORAL_TABLET | Freq: Every day | ORAL | 3 refills | Status: DC

## 2018-01-07 NOTE — Telephone Encounter (Signed)
Pt's wife update with lab results. Wife also requested for new prescription for amiodarone be sent to pharmacy.

## 2018-01-07 NOTE — Telephone Encounter (Signed)
° ° ° °*  STAT* If patient is at the pharmacy, call can be transferred to refill team.   1. Which medications need to be refilled? (please list name of each medication and dose if known) amiodarone (PACERONE) 200 MG tablet  2. Which pharmacy/location (including street and city if local pharmacy) is medication to be sent to?[facility], [place] - [address]  3. Do they need a 30 day or 90 day supply? [place]

## 2018-01-07 NOTE — Telephone Encounter (Signed)
Refill sent to the pharmacy electronically.  

## 2018-01-07 NOTE — Telephone Encounter (Signed)
New message   Please call with lab results

## 2018-01-23 ENCOUNTER — Encounter (HOSPITAL_COMMUNITY): Payer: Medicare HMO

## 2018-01-28 ENCOUNTER — Ambulatory Visit (HOSPITAL_COMMUNITY)
Admission: RE | Admit: 2018-01-28 | Discharge: 2018-01-28 | Disposition: A | Discharge status: Home / Self Care | Payer: Medicare HMO | Source: Ambulatory Visit | Attending: Cardiovascular Disease | Admitting: Cardiovascular Disease

## 2018-01-28 DIAGNOSIS — Z79899 Other long term (current) drug therapy: Secondary | ICD-10-CM | POA: Diagnosis present

## 2018-01-28 DIAGNOSIS — J449 Chronic obstructive pulmonary disease, unspecified: Secondary | ICD-10-CM | POA: Diagnosis present

## 2018-01-28 LAB — PULMONARY FUNCTION TEST
DL/VA % pred: 42 %
DL/VA: 2 ml/min/mmHg/L
DLCO unc % pred: 45 %
DLCO unc: 15.44 ml/min/mmHg
FEF 25-75 PRE: 2.62 L/s
FEF2575-%Pred-Pre: 95 %
FEV1-%PRED-PRE: 109 %
FEV1-PRE: 3.85 L
FEV1FVC-%Pred-Pre: 95 %
FEV6-%Pred-Pre: 120 %
FEV6-Pre: 5.39 L
FEV6FVC-%Pred-Pre: 104 %
FVC-%Pred-Pre: 115 %
FVC-Pre: 5.44 L
Pre FEV1/FVC ratio: 71 %
Pre FEV6/FVC Ratio: 99 %

## 2018-02-06 ENCOUNTER — Ambulatory Visit (INDEPENDENT_AMBULATORY_CARE_PROVIDER_SITE_OTHER): Payer: Medicare HMO | Admitting: *Deleted

## 2018-02-06 DIAGNOSIS — I5022 Chronic systolic (congestive) heart failure: Secondary | ICD-10-CM

## 2018-02-06 NOTE — Progress Notes (Signed)
Remote ICD transmission.   

## 2018-03-06 LAB — CUP PACEART REMOTE DEVICE CHECK
Implantable Pulse Generator Implant Date: 20170119
MDC IDC PG SERIAL: 160713
MDC IDC SESS DTM: 20190925104954

## 2018-05-08 ENCOUNTER — Ambulatory Visit (INDEPENDENT_AMBULATORY_CARE_PROVIDER_SITE_OTHER): Payer: Self-pay

## 2018-05-08 DIAGNOSIS — I5022 Chronic systolic (congestive) heart failure: Secondary | ICD-10-CM

## 2018-05-08 DIAGNOSIS — I10 Essential (primary) hypertension: Secondary | ICD-10-CM

## 2018-05-08 NOTE — Progress Notes (Signed)
Remote ICD transmission.   

## 2018-06-28 ENCOUNTER — Telehealth: Payer: Self-pay

## 2018-06-28 LAB — CUP PACEART REMOTE DEVICE CHECK
Battery Remaining Longevity: 84 mo
Brady Statistic RA Percent Paced: 1 %
Date Time Interrogation Session: 20191127101100
HighPow Impedance: 72 Ohm
Implantable Pulse Generator Implant Date: 20170119
Lead Channel Impedance Value: 345 Ohm
Lead Channel Impedance Value: 724 Ohm
Lead Channel Impedance Value: 834 Ohm
Lead Channel Pacing Threshold Amplitude: 1.2 V
Lead Channel Pacing Threshold Amplitude: 1.3 V
Lead Channel Setting Pacing Amplitude: 2.6 V
Lead Channel Setting Pacing Amplitude: 2.8 V
Lead Channel Setting Sensing Sensitivity: 0.6 mV
Lead Channel Setting Sensing Sensitivity: 1 mV
MDC IDC MSMT BATTERY REMAINING PERCENTAGE: 100 %
MDC IDC MSMT LEADCHNL LV PACING THRESHOLD PULSEWIDTH: 0.7 ms
MDC IDC MSMT LEADCHNL RA PACING THRESHOLD AMPLITUDE: 1.1 V
MDC IDC MSMT LEADCHNL RA PACING THRESHOLD PULSEWIDTH: 0.4 ms
MDC IDC MSMT LEADCHNL RV PACING THRESHOLD PULSEWIDTH: 0.4 ms
MDC IDC PG SERIAL: 160713
MDC IDC SET LEADCHNL LV PACING PULSEWIDTH: 0.7 ms
MDC IDC SET LEADCHNL RA PACING AMPLITUDE: 2 V
MDC IDC SET LEADCHNL RV PACING PULSEWIDTH: 0.4 ms
MDC IDC STAT BRADY RV PERCENT PACED: 100 %

## 2018-06-28 NOTE — Telephone Encounter (Signed)
   Dominic [name] Pre-operative Risk Assessment    Request for surgical clearance:  1. What type of surgery is being performed? Colonoscopy  2. When is this surgery scheduled? TBD  3. What type of clearance is required Pharmacy  4. Are there any medications that need to be held prior to surgery and how long? Plavix and Aspirin  5. Practice name and name of physician performing surgery? Dominic [name]  Dr.Badreddine  6. What is your office phone number [phone]   7.   What is your office fax number [phone]  8.   Anesthesia type Not listed   Dominic [name] 06/28/2018, 1:26 PM  _________________________________________________________________   (provider comments below)

## 2018-07-03 NOTE — Telephone Encounter (Signed)
Okay to hold aspirin and Plavix for 5-7 days before colonoscopy MCr

## 2018-07-03 NOTE — Telephone Encounter (Signed)
Dr Sallyanne Kuster please comment on holding aspirin and Plavix in this patient pre op.  Please respond to CV DIV PRE OP  Morrie Daywalt PA-C 07/03/2018 12:58 PM

## 2018-07-03 NOTE — Telephone Encounter (Signed)
   Primary Cardiologist: [name], MD  Chart reviewed as part of pre-operative protocol coverage.   Dr. Sallyanne Kuster reviewed patient's chart and cleared patient to hold aspirin and plavix 5-7 days prior to his upcoming colonoscopy.   I will route this recommendation to the requesting party via Epic fax function and remove from pre-op pool.  Please call with questions.  Abigail Butts, PA-C 07/03/2018, 1:24 PM

## 2018-07-23 ENCOUNTER — Other Ambulatory Visit: Payer: Self-pay | Admitting: Cardiovascular Disease

## 2018-08-07 ENCOUNTER — Ambulatory Visit (INDEPENDENT_AMBULATORY_CARE_PROVIDER_SITE_OTHER): Payer: Self-pay | Admitting: *Deleted

## 2018-08-07 DIAGNOSIS — I5022 Chronic systolic (congestive) heart failure: Secondary | ICD-10-CM

## 2018-08-08 LAB — CUP PACEART REMOTE DEVICE CHECK
Battery Remaining Percentage: 100 %
Brady Statistic RA Percent Paced: 2 %
Brady Statistic RV Percent Paced: 100 %
Date Time Interrogation Session: 20200226093400
HighPow Impedance: 73 Ohm
Lead Channel Impedance Value: 705 Ohm
Lead Channel Impedance Value: 796 Ohm
Lead Channel Pacing Threshold Pulse Width: 0.4 ms
Lead Channel Pacing Threshold Pulse Width: 0.7 ms
Lead Channel Setting Pacing Amplitude: 2 V
Lead Channel Setting Pacing Amplitude: 2.6 V
Lead Channel Setting Pacing Pulse Width: 0.4 ms
Lead Channel Setting Sensing Sensitivity: 0.6 mV
Lead Channel Setting Sensing Sensitivity: 1 mV
MDC IDC MSMT BATTERY REMAINING LONGEVITY: 78 mo
MDC IDC MSMT LEADCHNL LV PACING THRESHOLD AMPLITUDE: 1.2 V
MDC IDC MSMT LEADCHNL RA PACING THRESHOLD AMPLITUDE: 1.1 V
MDC IDC MSMT LEADCHNL RV IMPEDANCE VALUE: 343 Ohm
MDC IDC MSMT LEADCHNL RV PACING THRESHOLD AMPLITUDE: 1.3 V
MDC IDC MSMT LEADCHNL RV PACING THRESHOLD PULSEWIDTH: 0.4 ms
MDC IDC PG IMPLANT DT: 20170119
MDC IDC PG SERIAL: 160713
MDC IDC SET LEADCHNL LV PACING PULSEWIDTH: 0.7 ms
MDC IDC SET LEADCHNL RV PACING AMPLITUDE: 2.8 V

## 2018-08-14 NOTE — Progress Notes (Signed)
Remote ICD transmission.   

## 2018-10-14 ENCOUNTER — Other Ambulatory Visit: Payer: Self-pay | Admitting: Cardiovascular Disease

## 2018-11-07 ENCOUNTER — Ambulatory Visit (INDEPENDENT_AMBULATORY_CARE_PROVIDER_SITE_OTHER): Payer: Medicare Other | Admitting: *Deleted

## 2018-11-07 DIAGNOSIS — I5043 Acute on chronic combined systolic (congestive) and diastolic (congestive) heart failure: Secondary | ICD-10-CM

## 2018-11-07 DIAGNOSIS — Z95810 Presence of automatic (implantable) cardiac defibrillator: Secondary | ICD-10-CM

## 2018-11-07 DIAGNOSIS — I5022 Chronic systolic (congestive) heart failure: Secondary | ICD-10-CM

## 2018-11-07 LAB — CUP PACEART REMOTE DEVICE CHECK
Battery Remaining Longevity: 78 mo
Battery Remaining Percentage: 100 %
Brady Statistic RA Percent Paced: 1 %
Brady Statistic RV Percent Paced: 100 %
Date Time Interrogation Session: 20200528100400
HighPow Impedance: 78 Ohm
Implantable Pulse Generator Implant Date: 20170119
Lead Channel Impedance Value: 358 Ohm
Lead Channel Impedance Value: 744 Ohm
Lead Channel Impedance Value: 763 Ohm
Lead Channel Setting Pacing Amplitude: 2 V
Lead Channel Setting Pacing Amplitude: 2.6 V
Lead Channel Setting Pacing Amplitude: 2.8 V
Lead Channel Setting Pacing Pulse Width: 0.4 ms
Lead Channel Setting Pacing Pulse Width: 0.7 ms
Lead Channel Setting Sensing Sensitivity: 0.6 mV
Lead Channel Setting Sensing Sensitivity: 1 mV
Pulse Gen Serial Number: 160713

## 2018-11-12 ENCOUNTER — Telehealth: Payer: Self-pay | Admitting: Cardiovascular Disease

## 2018-11-12 NOTE — Telephone Encounter (Signed)
home phone/ consent/ my chart declined/ pre reg completed °

## 2018-11-13 ENCOUNTER — Encounter: Payer: Self-pay | Admitting: Cardiology

## 2018-11-13 ENCOUNTER — Telehealth: Payer: Medicare Other | Admitting: Cardiovascular Disease

## 2018-11-13 ENCOUNTER — Telehealth (INDEPENDENT_AMBULATORY_CARE_PROVIDER_SITE_OTHER): Payer: Medicare Other | Admitting: Cardiovascular Disease

## 2018-11-13 ENCOUNTER — Encounter: Payer: Self-pay | Admitting: Cardiovascular Disease

## 2018-11-13 DIAGNOSIS — J449 Chronic obstructive pulmonary disease, unspecified: Secondary | ICD-10-CM | POA: Diagnosis not present

## 2018-11-13 DIAGNOSIS — I9789 Other postprocedural complications and disorders of the circulatory system, not elsewhere classified: Secondary | ICD-10-CM

## 2018-11-13 DIAGNOSIS — I5043 Acute on chronic combined systolic (congestive) and diastolic (congestive) heart failure: Secondary | ICD-10-CM | POA: Diagnosis not present

## 2018-11-13 DIAGNOSIS — E1169 Type 2 diabetes mellitus with other specified complication: Secondary | ICD-10-CM

## 2018-11-13 DIAGNOSIS — Z953 Presence of xenogenic heart valve: Secondary | ICD-10-CM

## 2018-11-13 DIAGNOSIS — I10 Essential (primary) hypertension: Secondary | ICD-10-CM

## 2018-11-13 DIAGNOSIS — E669 Obesity, unspecified: Secondary | ICD-10-CM

## 2018-11-13 DIAGNOSIS — E1122 Type 2 diabetes mellitus with diabetic chronic kidney disease: Secondary | ICD-10-CM

## 2018-11-13 DIAGNOSIS — Z95810 Presence of automatic (implantable) cardiac defibrillator: Secondary | ICD-10-CM

## 2018-11-13 DIAGNOSIS — Z720 Tobacco use: Secondary | ICD-10-CM

## 2018-11-13 DIAGNOSIS — I4891 Unspecified atrial fibrillation: Secondary | ICD-10-CM

## 2018-11-13 DIAGNOSIS — E782 Mixed hyperlipidemia: Secondary | ICD-10-CM

## 2018-11-13 DIAGNOSIS — I2510 Atherosclerotic heart disease of native coronary artery without angina pectoris: Secondary | ICD-10-CM

## 2018-11-13 DIAGNOSIS — N184 Chronic kidney disease, stage 4 (severe): Secondary | ICD-10-CM

## 2018-11-13 NOTE — Patient Instructions (Signed)
Medication Instructions:  Your physician recommends that you continue on your current medications as directed. Please refer to the Current Medication list given to you today.  If you need a refill on your cardiac medications before your next appointment, please call your pharmacy.   Lab work: We will get your lab results from the nephrologist and PCP. If you have not had a LFT or TSH then we may need to order them.  Testing/Procedures: None ordered  Follow-Up: At [facility], you and your health needs are our priority.  As part of our continuing mission to provide you with exceptional heart care, we have created designated Provider Care Teams.  These Care Teams include your primary Cardiologist (physician) and Advanced Practice Providers (APPs -  Physician Assistants and Nurse Practitioners) who all work together to provide you with the care you need, when you need it. You will need a follow up appointment in 12 months.  Please call our office 2 months in advance to schedule this appointment.  You may see [name], MD or one of the following Advanced Practice Providers on your designated Care Team: Almyra Deforest, PA-C Fabian Sharp, [place]

## 2018-11-13 NOTE — Progress Notes (Signed)
Virtual Visit via Telephone Note   This visit type was conducted due to national recommendations for restrictions regarding the COVID-19 Pandemic (e.g. social distancing) in an effort to limit this patient's exposure and mitigate transmission in our community.  Due to his co-morbid illnesses, this patient is at least at moderate risk for complications without adequate follow up.  This format is felt to be most appropriate for this patient at this time.  The patient did not have access to video technology/had technical difficulties with video requiring transitioning to audio format only (telephone).  All issues noted in this document were discussed and addressed.  No physical exam could be performed with this format.  Please refer to the patient's chart for his  consent to telehealth for [facility].   Date:  11/13/2018   ID:  Dominic [name], DOB 10-10-1950, MRN [id number]  Patient Location: Home Provider Location: Home  PCP:  Dellinger, Estevan Ryder., MD  Cardiologist:  [name], MD  Electrophysiologist:  None   Evaluation Performed:  Follow-Up Visit  Chief Complaint:  CHF, CRT-D follow up  History of Present Illness:    Dominic [name] is a 68 y.o. 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  He is done remarkably well since his last appointment and has not required major adjustments in his diuretic dose or hospitalization for heart failure.  He does take an occasional "booster" dose of metolazone for weight gain, and currently notes that he is probably 5 pounds above his usual weight and might need to take some metolazone.  He is done a good job with this  adjustment by himself.  For the most part, he continues to have NYHA functional class II dyspnea.  Generally does well but finds it hard to breathe if he has to wear a facemask.  He does not have any leg edema.  Denies orthopnea or PND.  He has an AV graft that probably will require revision, but he has not yet needed hemodialysis.  He continues to smoke about 5 cigarettes a day, as does his wife.  Interrogation of his device shows excellent findings with 100% biventricular pacing and no interval episodes of ventricular tachycardia or atrial fibrillation.  Rarely requires atrial pacing and has a good heart rate histogram distribution.  Although lead parameters are stable.  His "footprint" suggests good heart failure compensation and is greatly improved compared with his presentation a couple of years ago.  Estimated generator longevity is 6.5 years.  The patient does not have symptoms concerning for COVID-19 infection (fever, chills, cough, or new shortness of breath).    Past Medical History:  Diagnosis Date   Biventricular ICD (implantable cardioverter-defibrillator) - [facility] 09/05/2015   Jul 01, 2015 [facility] Dynagen X4   CAD (coronary artery disease) 09/05/2015   CABG 2015 PCI-BMS ostial LCX Sept 2015 04/05/2015 cath 50% distal LM, 90% prox LAD, old stent prox LAD 70% ISR, patent LIMA-LAD, 80% prox RCA PCI-DES x2 RCA and LCX    Chronic systolic CHF (congestive heart failure) ([place]) 09/05/2015   a. [place] 10/29/15: PCW 8, CI 2.2. Overall low filling presssures. Mean PA 20; b. echo 10/30/15: EF 45-50%, not tech suff to allwo for LV diastolic fxn, mild AI, nl appearing MVR  CKD stage 4 due to type 2 diabetes mellitus ([place]) 09/05/2015   Diabetes mellitus, type 2 ([place]) 09/05/2015   Essential hypertension 09/05/2015   History of mitral valve replacement with bioprosthetic valve 09/05/2015   31 mm Edwards 2015, Dr. Jerelene Redden   Postoperative atrial fibrillation ([place]) 09/05/2015   Past Surgical History:    Procedure Laterality Date   CARDIAC CATHETERIZATION N/A 10/29/2015   Procedure: Right Heart Cath;  Surgeon: Jolaine Artist, MD;  Location: [place] [facility];  Service: Cardiovascular;  Laterality: N/A;   CARDIAC VALVE REPLACEMENT  2015   28mm Edwards bioprosthesis   CORONARY ANGIOPLASTY     CORONARY ARTERY BYPASS GRAFT  2015   [facility], Dr. Jerelene Redden.     Current Meds  Medication Sig   albuterol (PROVENTIL HFA;VENTOLIN HFA) 108 (90 Base) MCG/ACT inhaler Inhale 2 puffs into the lungs every 6 (six) hours as needed for wheezing or shortness of breath.   amiodarone (PACERONE) 200 MG tablet TAKE 1 TABLET BY MOUTH EVERY DAY   aspirin 81 MG tablet Take 81 mg by mouth daily.   atorvastatin (LIPITOR) 10 MG tablet TAKE 1 TABLET EVERY DAY   bisacodyl (DULCOLAX) 5 MG EC tablet Take 5 mg by mouth daily as needed for moderate constipation.   butalbital-acetaminophen-caffeine (FIORICET WITH CODEINE) 50-325-40-30 MG capsule Take 1 capsule by mouth every 4 (four) hours as needed for headache.   Cholecalciferol (VITAMIN D HIGH POTENCY PO) Take 2,000 Units by mouth daily.   clopidogrel (PLAVIX) 75 MG tablet Take 75 mg by mouth daily.   DULoxetine (CYMBALTA) 60 MG capsule Take 60 mg by mouth daily.    fluticasone (FLONASE) 50 MCG/ACT nasal spray Place 1 spray into both nostrils daily.   glipiZIDE (GLUCOTROL) 2.5 mg TABS tablet Take 2.5 mg by mouth daily before breakfast.   glucose blood test strip 1 each by Other route as needed for other. Use as instructed   isosorbide mononitrate (IMDUR) 60 MG 24 hr tablet Take 60 mg by mouth daily.   metolazone (ZAROXOLYN) 2.5 MG tablet TAKE 1 TABLET BY MOUTH EVERY DAY AS NEEDED FOR WEIGHT GAIN GREATER THAN 4 POUNDS   metoprolol succinate (TOPROL-XL) 25 MG 24 hr tablet Take 50 mg by mouth daily.    nitroGLYCERIN (NITROSTAT) 0.4 MG SL tablet Place 0.4 mg under the tongue every 5 (five) minutes as needed for chest pain.   sucralfate (CARAFATE) 1 g tablet  Take 1 g by mouth 4 (four) times daily -  with meals and at bedtime.   tiotropium (SPIRIVA HANDIHALER) 18 MCG inhalation capsule Place 1 capsule (18 mcg total) into inhaler and inhale daily.   tiZANidine (ZANAFLEX) 2 MG tablet Take 1 tablet by mouth 2 (two) times daily as needed for pain.   topiramate (TOPAMAX) 25 MG tablet Take 50 mg by mouth 2 (two) times daily.    torsemide (DEMADEX) 20 MG tablet Take 3 tablets by mouth daily.   umeclidinium-vilanterol (ANORO ELLIPTA) 62.5-25 MCG/INH AEPB Inhale 1 puff into the lungs daily.   vitamin B-12 (CYANOCOBALAMIN) 1000 MCG tablet Take 1,000 mcg by mouth daily.     Allergies:   Hydrochlorothiazide; Amoxicillin-pot clavulanate; Clindamycin; Lisinopril; Meloxicam; Topiramate; and Tamsulosin   Social History   Tobacco Use   Smoking status: Current Some Day Smoker    Packs/day: 0.50    Years: 40.00    Pack years: 20.00   Smokeless tobacco: Never Used  Substance Use Topics   Alcohol use: No    Alcohol/week: 0.0 standard  drinks   Drug use: No     Family Hx: The patient's family history includes Hyperlipidemia in his father; Hypertension in his father.  ROS:   Please see the history of present illness.     All other systems reviewed and are negative.   Prior CV studies:   The following studies were reviewed today:  Apprehensive defibrillator download from 02/07/2019  Labs/Other Tests and Data Reviewed:    EKG:  An ECG dated 11/08/2017 was personally reviewed today and demonstrated:  Atrial sensed (sinus), ventricular paced rhythm with positive R waves in lead V1  Recent Labs: 01/03/2018: ALT 15; BUN 58; Creatinine, Ser 3.44; Potassium 3.4; Sodium 135; TSH 2.650  07/26/2018 Creatinine 3.29, BUN 55, potassium 3.7  Recent Lipid Panel Lab Results  Component Value Date/Time   CHOL 108 (L) 03/10/2016 09:25 AM   TRIG 228 (H) 03/10/2016 09:25 AM   HDL 28 (L) 03/10/2016 09:25 AM   CHOLHDL 3.9 03/10/2016 09:25 AM   LDLCALC 34  03/10/2016 09:25 AM  Performed more recently by PCP, will request report  Wt Readings from Last 3 Encounters:  11/13/18 248 lb (112.5 kg)  11/08/17 243 lb 6.4 oz (110.4 kg)  06/06/17 240 lb 6.4 oz (109 kg)     Objective:    Vital Signs:  BP (!) 123/53    Pulse 68    Ht 6' (1.829 m)    Wt 248 lb (112.5 kg)    BMI 33.63 kg/m    VITAL SIGNS:  reviewed Unable to examine  ASSESSMENT & PLAN:    1. CHF: Chronic class II exertional dyspnea.  His weight is a little bit above his estimated dry weight and he is preparing to take a dose of metolazone.  This has worked well for him in the past.  Reinforced the importance of daily weight monitoring and sodium restriction. 2. COPD: He always has some residual dyspnea, even when adequately diuresed, and his PFT pattern and the response to bronchodilator suggests obstructive lung disease is the bigger problem. Again, I encouraged both Mr. Jani and his wife to stop smoking.  Repeat PFTs for amiodarone monitoring would be due right about now, but in the setting of the coronavirus pandemic we will defer these. 3. CAD:  He has not had angina since stent placement in 2016 and is a poor candidate for contrast based procedures, at least until he starts hemodialysis. 4. S/p MVR: 31 mm bioprosthesis with normal function by May 2017 echocardiogram.    Again, it would be nice to review prosthetic valve function by echocardiography but this will be deferred until the risks of coronavirus exposure are lower. He is at risk for early prosthesis degeneration due to kidney disease.  Reminded him of the need for endocarditis prophylaxis. 5. CRT-D: Continue remote downloads every 3 months and office visit in 12 months.  Normal function of his biventricular defibrillator. 100% biventricular pacing. No atrial or ventricular arrhythmia detected. His device is MRI conditional.   6. CKD stage 4: For this reason he is not on renin-angiotensin-aldosterone system inhibitors.   Fistula will probably require revision, but he does not yet need hemodialysis.  Nephrologist is Dr. [name].   Will try to retrieve the most recent labs from him. 7. HTN: Excellent control 8. DM: Reports good control.  Most recent hemoglobin A1c was borderline at 7.1%.  At increased risk of hypoglycemia with worsening renal function. 9. HLP: Excellent LDL level on statin, other lipid parameters consistent with insulin resistance  10. AFib:  None detected since CRT-D implantation.  Not on anticoagulation since this arrhythmia has not recurred since open heart surgery.  Remains on amiodarone since he felt to get high risk for arrhythmia recurrence.  Atrial fibrillation would be deleterious in the setting of a mitral bioprosthesis and trigger heart failure exacerbation as well as inappropriate defibrillator shocks.   11. Amiodarone:  Needs liver function tests and TSH checked at least every 6 months.  He thinks he has had these done with his recent labs with his PCP and/or nephrologist. 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 Recommended keeping legs elevated and wearing compression stockings.[name]    COVID-19 Education: The signs and symptoms of COVID-19 were discussed with the patient and how to seek care for testing (follow up with PCP or arrange E-visit).  The importance of social distancing was discussed today.  Time:   Today, I have spent 21 minutes with the patient with telehealth technology discussing the above problems.     Medication Adjustments/Labs and Tests Ordered: Current medicines are reviewed at length with the patient today.  Concerns regarding medicines are outlined above.   Tests Ordered: No orders of the defined types were placed in this encounter.  Patient Instructions  Medication Instructions:  Your physician recommends that you continue on your current medications as directed. Please refer to the Current Medication list given to you today.  If you  need a refill on your cardiac medications before your next appointment, please call your pharmacy.   Lab work: We will get your lab results from the nephrologist and PCP. If you have not had a LFT or TSH then we may need to order them.  Testing/Procedures: None ordered  Follow-Up: At [facility], you and your health needs are our priority.  As part of our continuing mission to provide you with exceptional heart care, we have created designated Provider Care Teams.  These Care Teams include your primary Cardiologist (physician) and Advanced Practice Providers (APPs -  Physician Assistants and Nurse Practitioners) who all work together to provide you with the care you need, when you need it. You will need a follow up appointment in 12 months.  Please call our office 2 months in advance to schedule this appointment.  You may see [name], MD or one of the following Advanced Practice Providers on your designated Care Team: Almyra Deforest, [place] Fabian Sharp, [place]         Medication Changes: No orders of the defined types were placed in this encounter.   Disposition:  Follow up 12 months  Signed, [name], MD  11/13/2018 1:16 PM    [facility]

## 2018-11-13 NOTE — Progress Notes (Signed)
Remote ICD transmission.   

## 2019-02-06 ENCOUNTER — Ambulatory Visit (INDEPENDENT_AMBULATORY_CARE_PROVIDER_SITE_OTHER): Payer: Medicare Other | Admitting: *Deleted

## 2019-02-06 DIAGNOSIS — I4891 Unspecified atrial fibrillation: Secondary | ICD-10-CM

## 2019-02-06 DIAGNOSIS — I5043 Acute on chronic combined systolic (congestive) and diastolic (congestive) heart failure: Secondary | ICD-10-CM | POA: Diagnosis not present

## 2019-02-07 LAB — CUP PACEART REMOTE DEVICE CHECK
Battery Remaining Longevity: 72 mo
Battery Remaining Percentage: 98 %
Brady Statistic RA Percent Paced: 1 %
Brady Statistic RV Percent Paced: 100 %
Date Time Interrogation Session: 20200828111252
HighPow Impedance: 76 Ohm
Implantable Pulse Generator Implant Date: 20170119
Lead Channel Impedance Value: 410 Ohm
Lead Channel Impedance Value: 767 Ohm
Lead Channel Impedance Value: 809 Ohm
Lead Channel Pacing Threshold Amplitude: 1.1 V
Lead Channel Pacing Threshold Amplitude: 1.2 V
Lead Channel Pacing Threshold Amplitude: 1.3 V
Lead Channel Pacing Threshold Pulse Width: 0.4 ms
Lead Channel Pacing Threshold Pulse Width: 0.4 ms
Lead Channel Pacing Threshold Pulse Width: 0.7 ms
Lead Channel Setting Pacing Amplitude: 2 V
Lead Channel Setting Pacing Amplitude: 2.6 V
Lead Channel Setting Pacing Amplitude: 2.8 V
Lead Channel Setting Pacing Pulse Width: 0.4 ms
Lead Channel Setting Pacing Pulse Width: 0.7 ms
Lead Channel Setting Sensing Sensitivity: 0.6 mV
Lead Channel Setting Sensing Sensitivity: 1 mV
Pulse Gen Serial Number: 160713

## 2019-02-11 NOTE — Progress Notes (Signed)
Remote ICD transmission.   

## 2019-04-18 ENCOUNTER — Other Ambulatory Visit: Payer: Self-pay | Admitting: Cardiovascular Disease

## 2019-04-18 NOTE — Telephone Encounter (Signed)
Rx request sent to pharmacy.  

## 2019-05-12 ENCOUNTER — Ambulatory Visit (INDEPENDENT_AMBULATORY_CARE_PROVIDER_SITE_OTHER): Payer: Medicare Other | Admitting: *Deleted

## 2019-05-12 DIAGNOSIS — I5022 Chronic systolic (congestive) heart failure: Secondary | ICD-10-CM | POA: Diagnosis not present

## 2019-05-12 LAB — CUP PACEART REMOTE DEVICE CHECK
Battery Remaining Longevity: 66 mo
Battery Remaining Percentage: 90 %
Brady Statistic RA Percent Paced: 1 %
Brady Statistic RV Percent Paced: 100 %
Date Time Interrogation Session: 20201130055500
HighPow Impedance: 77 Ohm
Implantable Pulse Generator Implant Date: 20170119
Lead Channel Impedance Value: 340 Ohm
Lead Channel Impedance Value: 634 Ohm
Lead Channel Impedance Value: 729 Ohm
Lead Channel Pacing Threshold Amplitude: 1.1 V
Lead Channel Pacing Threshold Amplitude: 1.2 V
Lead Channel Pacing Threshold Amplitude: 1.3 V
Lead Channel Pacing Threshold Pulse Width: 0.4 ms
Lead Channel Pacing Threshold Pulse Width: 0.4 ms
Lead Channel Pacing Threshold Pulse Width: 0.7 ms
Lead Channel Setting Pacing Amplitude: 2 V
Lead Channel Setting Pacing Amplitude: 2.6 V
Lead Channel Setting Pacing Amplitude: 2.8 V
Lead Channel Setting Pacing Pulse Width: 0.4 ms
Lead Channel Setting Pacing Pulse Width: 0.7 ms
Lead Channel Setting Sensing Sensitivity: 0.6 mV
Lead Channel Setting Sensing Sensitivity: 1 mV
Pulse Gen Serial Number: 160713

## 2019-06-04 NOTE — Progress Notes (Signed)
ICD remote 

## 2019-08-06 ENCOUNTER — Other Ambulatory Visit: Payer: Self-pay | Admitting: Cardiovascular Disease

## 2019-08-11 ENCOUNTER — Ambulatory Visit (INDEPENDENT_AMBULATORY_CARE_PROVIDER_SITE_OTHER): Payer: Medicare HMO | Admitting: *Deleted

## 2019-08-11 DIAGNOSIS — I5022 Chronic systolic (congestive) heart failure: Secondary | ICD-10-CM | POA: Diagnosis not present

## 2019-08-11 LAB — CUP PACEART REMOTE DEVICE CHECK
Battery Remaining Longevity: 66 mo
Battery Remaining Percentage: 87 %
Brady Statistic RA Percent Paced: 1 %
Brady Statistic RV Percent Paced: 100 %
Date Time Interrogation Session: 20210301051300
HighPow Impedance: 82 Ohm
Implantable Pulse Generator Implant Date: 20170119
Lead Channel Impedance Value: 345 Ohm
Lead Channel Impedance Value: 617 Ohm
Lead Channel Impedance Value: 677 Ohm
Lead Channel Pacing Threshold Amplitude: 1.1 V
Lead Channel Pacing Threshold Amplitude: 1.2 V
Lead Channel Pacing Threshold Amplitude: 1.3 V
Lead Channel Pacing Threshold Pulse Width: 0.4 ms
Lead Channel Pacing Threshold Pulse Width: 0.4 ms
Lead Channel Pacing Threshold Pulse Width: 0.7 ms
Lead Channel Setting Pacing Amplitude: 2 V
Lead Channel Setting Pacing Amplitude: 2.6 V
Lead Channel Setting Pacing Amplitude: 2.8 V
Lead Channel Setting Pacing Pulse Width: 0.4 ms
Lead Channel Setting Pacing Pulse Width: 0.7 ms
Lead Channel Setting Sensing Sensitivity: 0.6 mV
Lead Channel Setting Sensing Sensitivity: 1 mV
Pulse Gen Serial Number: 160713

## 2019-08-11 NOTE — Progress Notes (Signed)
ICD Remote  

## 2019-09-23 ENCOUNTER — Telehealth: Payer: Self-pay | Admitting: Cardiovascular Disease

## 2019-09-23 NOTE — Telephone Encounter (Signed)
Pt c/o of Chest Pain: STAT if CP now or developed within 24 hours  1. Are you having CP right now? No  2. Are you experiencing any other symptoms (ex. SOB, nausea, vomiting, sweating)? SOB  3. How long have you been experiencing CP? 1 week  4. Is your CP continuous or coming and going? Coming and going  5. Have you taken Nitroglycerin? No   Pt c/o Shortness Of Breath: STAT if SOB developed within the last 24 hours or pt is noticeably SOB on the phone  1. Are you currently SOB (can you hear that pt is SOB on the phone)? No  2. How long have you been experiencing SOB? 1 week  3. Are you SOB when sitting or when up moving around? Both  4. Are you currently experiencing any other symptoms? No   Patient's wife is requesting an appointment with Dr. Sallyanne Kuster due to CP and SOB. I made her aware that Dr. Sallyanne Kuster currently does not have an OV available, however I told her I will advise with the nurse. Please assist.  ?

## 2019-09-23 NOTE — Telephone Encounter (Signed)
Returned call to patient of Dr. Loletha Grayer He reports pain in center chest when he breathes, chest pain/SOB worse with exertion Patient has SOB & chest pain x1 week He has not used NTG He is using inhalers but no noticeable relief Patient is 257lbs (was 268lbs on 08/13/19 per visit with [facility])  Scheduled OV with MD on 4/14 @ 2:40pm (DOD slot)  Routed to MD/RN as Juluis Rainier

## 2019-09-24 ENCOUNTER — Other Ambulatory Visit: Payer: Self-pay

## 2019-09-24 ENCOUNTER — Ambulatory Visit (INDEPENDENT_AMBULATORY_CARE_PROVIDER_SITE_OTHER): Payer: Medicare HMO | Admitting: Cardiovascular Disease

## 2019-09-24 ENCOUNTER — Encounter: Payer: Self-pay | Admitting: Cardiovascular Disease

## 2019-09-24 ENCOUNTER — Ambulatory Visit
Admission: RE | Admit: 2019-09-24 | Discharge: 2019-09-24 | Disposition: A | Discharge status: Home / Self Care | Payer: Medicare HMO | Source: Ambulatory Visit | Attending: Cardiovascular Disease | Admitting: Cardiovascular Disease

## 2019-09-24 VITALS — BP 124/60 | HR 71 | Temp 97.0°F | Ht 71.5 in | Wt 265.0 lb

## 2019-09-24 DIAGNOSIS — Z5181 Encounter for therapeutic drug level monitoring: Secondary | ICD-10-CM | POA: Diagnosis not present

## 2019-09-24 DIAGNOSIS — R071 Chest pain on breathing: Secondary | ICD-10-CM

## 2019-09-24 DIAGNOSIS — I5042 Chronic combined systolic (congestive) and diastolic (congestive) heart failure: Secondary | ICD-10-CM | POA: Diagnosis not present

## 2019-09-24 DIAGNOSIS — J449 Chronic obstructive pulmonary disease, unspecified: Secondary | ICD-10-CM

## 2019-09-24 DIAGNOSIS — Z953 Presence of xenogenic heart valve: Secondary | ICD-10-CM

## 2019-09-24 DIAGNOSIS — I10 Essential (primary) hypertension: Secondary | ICD-10-CM

## 2019-09-24 DIAGNOSIS — I9789 Other postprocedural complications and disorders of the circulatory system, not elsewhere classified: Secondary | ICD-10-CM

## 2019-09-24 DIAGNOSIS — R079 Chest pain, unspecified: Secondary | ICD-10-CM

## 2019-09-24 DIAGNOSIS — R0602 Shortness of breath: Secondary | ICD-10-CM

## 2019-09-24 DIAGNOSIS — E1122 Type 2 diabetes mellitus with diabetic chronic kidney disease: Secondary | ICD-10-CM

## 2019-09-24 DIAGNOSIS — I25118 Atherosclerotic heart disease of native coronary artery with other forms of angina pectoris: Secondary | ICD-10-CM

## 2019-09-24 DIAGNOSIS — Z95810 Presence of automatic (implantable) cardiac defibrillator: Secondary | ICD-10-CM

## 2019-09-24 DIAGNOSIS — I4891 Unspecified atrial fibrillation: Secondary | ICD-10-CM

## 2019-09-24 DIAGNOSIS — E782 Mixed hyperlipidemia: Secondary | ICD-10-CM

## 2019-09-24 DIAGNOSIS — N184 Chronic kidney disease, stage 4 (severe): Secondary | ICD-10-CM

## 2019-09-24 DIAGNOSIS — Z79899 Other long term (current) drug therapy: Secondary | ICD-10-CM

## 2019-09-24 NOTE — Patient Instructions (Addendum)
Medication Instructions:  STOP the Amiodarone  *If you need a refill on your cardiac medications before your next appointment, please call your pharmacy*   Lab Work: Your provider would like for you to have the following labs today: BNP, CMET and Sed rate  If you have labs (blood work) drawn today and your tests are completely normal, you will receive your results only by: [name] MyChart Message (if you have MyChart) OR . A paper copy in the mail If you have any lab test that is abnormal or we need to change your treatment, we will call you to review the results.   Testing/Procedures: Your physician has requested that you have a lexiscan myoview. For further information please visit [email or web address]. Please follow instruction sheet, as given. This will take place at [place], [address]  How to prepare for your Myocardial Perfusion Test:  Do not eat or drink 3 hours prior to your test, except you may have water.  Do not consume products containing caffeine (regular or decaffeinated) 12 hours prior to your test. (ex: coffee, chocolate, sodas, tea).  Do bring a list of your current medications with you.  If not listed below, you may take your medications as normal.  Do wear comfortable clothes (no dresses or overalls) and walking shoes, tennis shoes preferred (No heels or open toe shoes are allowed).  Do NOT wear cologne, perfume, aftershave, or lotions (deodorant is allowed).  The test will take approximately 3 to 4 hours to complete  If these instructions are not followed, your test will have to be rescheduled.  Use your inhaler before the test   A chest x-ray takes a picture of the organs and structures inside the chest, including the heart, lungs, and blood vessels. This test can show several things, including, whether the heart is enlarges; whether fluid is building up in the lungs; and whether pacemaker / defibrillator leads are still in place. Please have this done  today at [place]. You do not need an appointment.   Follow-Up: At [facility], you and your health needs are our priority.  As part of our continuing mission to provide you with exceptional heart care, we have created designated Provider Care Teams.  These Care Teams include your primary Cardiologist (physician) and Advanced Practice Providers (APPs -  Physician Assistants and Nurse Practitioners) who all work together to provide you with the care you need, when you need it.  We recommend signing up for the patient portal called "MyChart".  Sign up information is provided on this After Visit Summary.  MyChart is used to connect with patients for Virtual Visits (Telemedicine).  Patients are able to view lab/test results, encounter notes, upcoming appointments, etc.  Non-urgent messages can be sent to your provider as well.   To learn more about what you can do with MyChart, go to [email or web address].    Your next appointment:   2 week(s)  The format for your next appointment:   In Person  Provider:   With an APP

## 2019-09-24 NOTE — Progress Notes (Signed)
`   Cardiology Office Note    Date:  09/24/2019   ID:  Dominic [name], DOB 1951/04/18, MRN [id number]  PCP:  Frederic Jericho Estevan Ryder., MD  Cardiologist:   [name], MD   Chief Complaint  Patient presents with  . Congestive Heart Failure  . Pacemaker Check    CRT-D  . Shortness of Breath    History of Present Illness:  Dominic [name] is a 69 y.o. 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  For the last week or so he has been more short of breath, describing NYHA functional class IIIa symptoms.  He has not had leg edema and he does not have orthopnea or PND.  He also describes a relatively sharp discomfort in his right chest that is made worse by deep breathing and cough.  The cough is not productive.  He has not had fever, chills or sick contacts.  He has received both his COVID-19 shots.  He has not had unilateral calf swelling or tenderness.   He weighs about 17 pounds more than he did when we last saw him in the office in June 2020.  Not clear if this weight gain was gradual or abrupt.  He is worried that his "arteries are closing up again".  His last coronary angiogram was performed in 2016 when he received 2 stents.  Did a full check on his biventricular pacemaker -ICD today.[name] He has a dual chamber CRT-D [employer] X4 device, implanted in 2017.  LV pacing vector is LV ring 4 to can.  He has 99% atrial sensed rhythm with 100% biventricular pacing and there has been no atrial fibrillation or ventricular tachycardia since device implantation.  Estimated generator longevity is 5-1/2 years, excellent lead parameters.  His right forearm AV fistula has  healed nicely but he has not yet required hemodialysis.  His most recent creatinine was 3.43 on July 25, 2019, essentially unchanged over the last 24 months  Both the patient and his wife continue to smoke, although he reports that he has cut back to less than half a pack of cigarettes a day.  [name] had severe ischemic cardiomyopathy with left ventricular ejection fraction estimated to be approximately 30% by echocardiogram performed in October 2016. That echo describes septal and apical akinesis with global hypokinesis. He had evidence of restrictive filling as well as moderate right ventricular systolic dysfunction and moderate aortic insufficiency. Echo in May 2017 (after PCI and CRT) shows improved LVEF 45-50% and normal function of the mitral valve prosthesis. Right heart catheterization in May 2017 showed right atrial pressure 8, mean wedge pressure 6, cardiac index 2.2 L/minute.   He has an extensive history of coronary artery disease. He tells me that he thinks he has had 9 cardiac catheterizations and 5 stents. At least on one admission he had 2 episodes of cardiac arrest. I think this was during his hospitalization for bypass surgery/valve replacement. He underwent bypass surgery in [place] in 2015 with synchronous mitral valve replacement with a biological prosthesis. He has a history of previous stent placed in the LAD artery, type of stent and date of procedure unknown. He underwent placement of a bare metal stent to the ostium of the left circumflex  coronary artery in September 2015. He subsequently underwent placement of 2 drug-eluting stents at [facility] in October 2016, placed in the right coronary artery and left circumflex coronary artery respectively. At the time of that cardiac catheterization he had 50% stenosis in the distal left main, 90% proximal LAD, 70% in-stent restenosis proximal LAD, patent LIMA to distal LAD, 80% RCA. No mention is made of his second bypass  graft, which I presume is occluded. He is on combination aspirin/clopidogrel.  At the time of his bypass surgery received a 31 mm [employer] mitral bioprosthetic valve. By echocardiogram last October 2016 the mean transvalvular gradient was 5 mmHg. The same echocardiogram described a trileaflet unrestricted aortic valve with moderate insufficiency.  His chart describes a history of postoperative atrial fibrillation. He is not on anticoagulants. He takes low-dose amiodarone.  He has a history of left bundle branch block and he received a CRT-D [employer] device on 07/01/2015. The device and the leads are MRI conditional.  He has advanced chronic kidney disease. With recent baseline creatinine around 3.0-3.5. During his hospitalization in January 2017 the peak creatinine reached 4.6 (acute diarrheal illness), reaching a similar level in August 2017 when he had nausea and vomiting.[name] He has type 2 diabetes mellitus (not requiring insulin), complicated by kidney disease and neuropathy.  He takes atorvastatin in a relatively low dose. He has long-standing treated hypertension. He is a former smoker and has a history of COPD and takes bronchodilators. He has a history of migraine headaches treated with Fioricet. He underwent sigmoid colectomy in November 2017 for colon cancer.  Past Medical History:  Diagnosis Date  . Biventricular ICD (implantable cardioverter-defibrillator) - [facility] 09/05/2015   Jul 01, 2015 [facility] Dynagen X4  . CAD (coronary artery disease) 09/05/2015   CABG 2015 PCI-BMS ostial LCX Sept 2015 04/05/2015 cath 50% distal LM, 90% prox LAD, old stent prox LAD 70% ISR, patent LIMA-LAD, 80% prox RCA PCI-DES x2 RCA and LCX   . Chronic systolic CHF (congestive heart failure) ([place]) 09/05/2015   a. [place] 10/29/15: PCW 8, CI 2.2. Overall low filling presssures. Mean PA 20; b. echo 10/30/15: EF 45-50%, not tech suff to allwo for LV diastolic fxn, mild AI, nl appearing MVR  . CKD stage 4 due to  type 2 diabetes mellitus ([place]) 09/05/2015  . Diabetes mellitus, type 2 ([place]) 09/05/2015  . Essential hypertension 09/05/2015  . History of mitral valve replacement with bioprosthetic valve 09/05/2015   31 mm Edwards 2015, Dr. Jerelene Redden  . Postoperative atrial fibrillation ([place]) 09/05/2015    Past Surgical History:  Procedure Laterality Date  . CARDIAC CATHETERIZATION N/A 10/29/2015   Procedure: Right Heart Cath;  Surgeon: Jolaine Artist, MD;  Location: [facility];  Service: Cardiovascular;  Laterality: N/A;  . CARDIAC VALVE REPLACEMENT  2015   70m Edwards bioprosthesis  . CORONARY ANGIOPLASTY    . CORONARY ARTERY BYPASS GRAFT  2015   [facility], Dr. MJerelene Redden    Current Medications: Outpatient Medications Prior to Visit  Medication Sig Dispense Refill  . albuterol (PROVENTIL HFA;VENTOLIN HFA) 108 (90 Base) MCG/ACT inhaler Inhale 2 puffs into the lungs every 6 (six) hours as needed for wheezing or shortness of breath.    .Marland Kitchenaspirin 81 MG tablet Take 81 mg by mouth daily.    .Marland Kitchenatorvastatin (LIPITOR) 10 MG tablet TAKE 1 TABLET EVERY DAY (Patient taking differently: Take 20 mg by mouth daily. ) 90 tablet 2  . bisacodyl (DULCOLAX) 5 MG EC  tablet Take 5 mg by mouth daily as needed for moderate constipation.    . Cholecalciferol (VITAMIN D HIGH POTENCY PO) Take 2,000 Units by mouth daily.    . clopidogrel (PLAVIX) 75 MG tablet Take 75 mg by mouth daily.    . DULoxetine (CYMBALTA) 60 MG capsule Take 60 mg by mouth daily.     . fluticasone (FLONASE) 50 MCG/ACT nasal spray Place 1 spray into both nostrils daily.    [name] glipiZIDE (GLUCOTROL) 2.5 mg TABS tablet Take 2.5 mg by mouth daily before breakfast.    . HYDROcodone-acetaminophen (NORCO/VICODIN) 5-325 MG tablet TAKE 1 TABLET BY MOUTH EVERY 6 HOURS AS NEEDED FOR UP TO 5 DAYS    . metolazone (ZAROXOLYN) 2.5 MG tablet TAKE 1 TABLET BY MOUTH EVERY DAY AS NEEDED FOR WEIGHT GAIN GREATER THAN 4 POUNDS 90 tablet 0  . metoprolol succinate (TOPROL-XL) 25 MG  24 hr tablet Take 50 mg by mouth daily.     . mirtazapine (REMERON) 7.5 MG tablet Take 7.5 mg by mouth at bedtime.    . nitroGLYCERIN (NITROSTAT) 0.4 MG SL tablet Place 0.4 mg under the tongue every 5 (five) minutes as needed for chest pain.    . potassium chloride SA (KLOR-CON) 20 MEQ tablet Take 40 mEq by mouth 2 (two) times daily.    . sucralfate (CARAFATE) 1 g tablet Take 1 g by mouth 3 (three) times daily.     [name] tiotropium (SPIRIVA HANDIHALER) 18 MCG inhalation capsule Place 1 capsule (18 mcg total) into inhaler and inhale daily. 30 capsule 11  . tiZANidine (ZANAFLEX) 2 MG tablet Take 1 tablet by mouth 2 (two) times daily as needed for pain.    [name] topiramate (TOPAMAX) 25 MG tablet Take 50 mg by mouth 2 (two) times daily.     [name] torsemide (DEMADEX) 20 MG tablet Take 2 tablets by mouth 2 (two) times daily.     . vitamin B-12 (CYANOCOBALAMIN) 1000 MCG tablet Take 1,000 mcg by mouth daily.    [name] amiodarone (PACERONE) 200 MG tablet TAKE 1 TABLET BY MOUTH EVERY DAY 90 tablet 1  . glucose blood test strip 1 each by Other route as needed for other. Use as instructed    . butalbital-acetaminophen-caffeine (FIORICET WITH CODEINE) 50-325-40-30 MG capsule Take 1 capsule by mouth every 4 (four) hours as needed for headache.    [name] glipiZIDE (GLUCOTROL) 5 MG tablet Take 5 mg by mouth daily before breakfast.    . isosorbide mononitrate (IMDUR) 60 MG 24 hr tablet Take 60 mg by mouth daily.    [name] umeclidinium-vilanterol (ANORO ELLIPTA) 62.5-25 MCG/INH AEPB Inhale 1 puff into the lungs daily. 1 each 5   No facility-administered medications prior to visit.     Allergies:   Hydrochlorothiazide, Amoxicillin-pot clavulanate, Clindamycin, Lisinopril, Meloxicam, Topiramate, and Tamsulosin   Social History   Socioeconomic History  . Marital status: Married    Spouse name: Not on file  . Number of children: Not on file  . Years of education: Not on file  . Highest education level: Not on file  Occupational  History  . Not on file  Tobacco Use  . Smoking status: Current Some Day Smoker    Packs/day: 0.50    Years: 40.00    Pack years: 20.00  . Smokeless tobacco: Never Used  Substance and Sexual Activity  . Alcohol use: No    Alcohol/week: 0.0 standard drinks  . Drug use: No  . Sexual activity: Not Currently  Other  Topics Concern  . Not on file  Social History Narrative  . Not on file   Social Determinants of Health   Financial Resource Strain:   . Difficulty of Paying Living Expenses:   Food Insecurity:   . Worried About Charity fundraiser in the Last Year:   . Arboriculturist in the Last Year:   Transportation Needs:   . Film/video editor (Medical):   [name] Lack of Transportation (Non-Medical):   Physical Activity:   . Days of Exercise per Week:   . Minutes of Exercise per Session:   Stress:   . Feeling of Stress :   Social Connections:   . Frequency of Communication with Friends and Family:   . Frequency of Social Gatherings with Friends and Family:   . Attends Religious Services:   . Active Member of Clubs or Organizations:   . Attends Archivist Meetings:   [name] Marital Status:      Family History:  The patient's family history includes Hyperlipidemia in his father; Hypertension in his father.   ROS:   Please see the history of present illness.    ROS All other systems reviewed and are negative.   PHYSICAL EXAM:   VS:  BP 124/60   Pulse 71   Temp (!) 97 F (36.1 C)   Ht 5' 11.5" (1.816 m)   Wt 265 lb (120.2 kg)   BMI 36.44 kg/m      General: Alert, oriented x3, no distress, the left subclavian defibrillator site Head: no evidence of trauma, PERRL, strabismus, no exophtalmos or lid lag, no myxedema, no xanthelasma; normal ears, nose and oropharynx Neck: normal jugular venous pulsations and no hepatojugular reflux; brisk carotid pulses without delay and no carotid bruits Chest: clear to auscultation, no signs of consolidation by percussion or  palpation, normal fremitus, symmetrical and full respiratory excursions Cardiovascular: normal position and quality of the apical impulse, regular rhythm, normal first and paradoxically split second heart sounds, no murmurs, rubs or gallops Abdomen: no tenderness or distention, no masses by palpation, no abnormal pulsatility or arterial bruits, normal bowel sounds, no hepatosplenomegaly Extremities: no clubbing, cyanosis, 1+ ankle edema ; he has a moderate-sized AV fistula in the right forearm with a good thrill and bruit Neurological: grossly nonfocal, strabismus Psych: Normal mood and affect   Wt Readings from Last 3 Encounters:  09/24/19 265 lb (120.2 kg)  11/13/18 248 lb (112.5 kg)  11/08/17 243 lb 6.4 oz (110.4 kg)      Studies/Labs Reviewed:   EKG:  EKG is ordered today.  It shows atrial sensed, biventricular paced rhythm with distinct positive R wave in lead V1. Recent Labs: No results found for requested labs within last 8760 hours.  07/25/2019 Glucose 149, BUN 59, creatinine 3.43, potassium 3.9, hemoglobin 15.1 platelets 220 6K 03/12/2019  hemoglobin A1c 6.6%  Lipid Panel    Component Value Date/Time   CHOL 108 (L) 03/10/2016 0925   TRIG 228 (H) 03/10/2016 0925   HDL 28 (L) 03/10/2016 0925   CHOLHDL 3.9 03/10/2016 0925   VLDL 46 (H) 03/10/2016 0925   LDLCALC 34 03/10/2016 0925    12/30/2018 Total cholesterol 143, triglycerides 252, HDL 35 LDL 67   ASSESSMENT:    1. Chronic combined systolic and diastolic heart failure ([place])   2. Chronic obstructive pulmonary disease, unspecified COPD type ([place])   3. Encounter for monitoring amiodarone therapy   4. Coronary artery disease involving native coronary artery of native  heart with other form of angina pectoris ([place])   5. History of mitral valve replacement with bioprosthetic valve   6. Biventricular ICD (implantable cardioverter-defibrillator) - [facility]   7. CKD (chronic kidney disease) stage 4, GFR 15-29 ml/min (H[facility])   8.  Diabetes mellitus with stage 4 chronic kidney disease GFR 15-29 ([place])   9. Essential hypertension   10. Mixed hyperlipidemia   11. Postoperative atrial fibrillation ([place])   12. Shortness of breath   13. Chest pain on breathing      PLAN:  In order of problems listed above:  1. CHF: He has developed worsening exertional dyspnea, now functional class IIIa.  His weight has gone up about 20 pounds over our previous estimation of his dry weight, although it is unclear whether this occurred gradually or recently.  He does not have overt edema or jugular venous distention.  He has atypical chest discomfort that sounds more pleuritic than anginal.  Treatment with diuretics needs to be undertaken with some caution due to advanced chronic kidney disease.  We will check a chest x-ray and labs.  He is not receiving RAAS inhibitors due to advanced chronic kidney disease.  He is on beta-blockers. 2. COPD: In the past dyspnea was primarily due to lung disease, as demonstrated by right and left heart catheterization. the PFT pattern and the response to bronchodilator suggests obstructive lung disease is the bigger problem. Again, I encouraged both Mr. Hulse and his wife to stop smoking.   3. Amiodarone: He has been on amiodarone for about 4 years, so far without side effects.  Need to keep in mind that his worsening dyspnea and cough might be related to amiodarone side effects.  No significant atrial or ventricular arrhythmia has been detected since defibrillator implantation 4 years ago.  Asked him to stop the amiodarone.  We will check a chest x-ray.  Check ESR.  Liver function tests and TSH. 4. CAD: He is worried that his current symptoms are due to recurrent coronary insufficiency, although his chest discomfort sounds more pleuritic than anginal.  Poor candidate for angiography until he starts dialysis.  We will schedule for a Lexiscan Myoview and plan coronary angiography if there is a large area of reversible  ischemia.  He is on beta-blockers, aspirin and clopidogrel and an effective statin 5. S/p MVR: We have not had a recent echocardiogram to reevaluate his mitral valve prosthesis (not since 2017) and he is at risk for early prosthetic degeneration due to his advanced chronic kidney disease.  Schedule echo.  Reminded him of the need for endocarditis prophylaxis. 6. CRT-D: Normal function of his biventricular defibrillator by last remote download in March and full check on his device in the office today. 100% biventricular pacing. No atrial or ventricular arrhythmia detected. His device is MRI conditional.  Continue remote downloads every 3 months and office visits 7. CKD stage 4: Creatinine has been relatively stable at around 3.4, estimated GFR approximately 15.  Has a mature fistula that has not yet been used.  Nephrologist is Dr. [name].   8. HTN: Excellent control 9. DM: Reports good control.  Most recent hemoglobin A1c was only 5.8%. 10. HLP: Excellent LDL level on statin, other lipid parameters consistent with insulin resistance  11. AFib: Not on anticoagulation since this arrhythmia has not recurred since open heart surgery.  We will stop his amiodarone until we clarify whether there is any evidence of lung toxicity.  Atrial fibrillation would be deleterious in the setting of  a mitral bioprosthesis and trigger heart failure exacerbation as well as inappropriate defibrillator shocks.   12. Peripheral venous insufficiency: He has minimal edema.  ADDENDUM: Chest x-ray reviewed.  Appearance does not suggest pneumonia, CHF exacerbation or amiodarone toxicity.  Official radiology interpretation pending   Medication Adjustments/Labs and Tests Ordered: Current medicines are reviewed at length with the patient today.  Concerns regarding medicines are outlined above.  Medication changes, Labs and Tests ordered today are listed in the Patient Instructions below. Patient Instructions  Medication  Instructions:  STOP the Amiodarone  *If you need a refill on your cardiac medications before your next appointment, please call your pharmacy*   Lab Work: Your provider would like for you to have the following labs today: BNP, CMET and Sed rate  If you have labs (blood work) drawn today and your tests are completely normal, you will receive your results only by: [name] MyChart Message (if you have MyChart) OR . A paper copy in the mail If you have any lab test that is abnormal or we need to change your treatment, we will call you to review the results.   Testing/Procedures: Your physician has requested that you have a lexiscan myoview. For further information please visit [email or web address]. Please follow instruction sheet, as given. This will take place at [place] [place], [address]  How to prepare for your Myocardial Perfusion Test:  Do not eat or drink 3 hours prior to your test, except you may have water.  Do not consume products containing caffeine (regular or decaffeinated) 12 hours prior to your test. (ex: coffee, chocolate, sodas, tea).  Do bring a list of your current medications with you.  If not listed below, you may take your medications as normal.  Do wear comfortable clothes (no dresses or overalls) and walking shoes, tennis shoes preferred (No heels or open toe shoes are allowed).  Do NOT wear cologne, perfume, aftershave, or lotions (deodorant is allowed).  The test will take approximately 3 to 4 hours to complete  If these instructions are not followed, your test will have to be rescheduled.  Use your inhaler before the test   A chest x-ray takes a picture of the organs and structures inside the chest, including the heart, lungs, and blood vessels. This test can show several things, including, whether the heart is enlarges; whether fluid is building up in the lungs; and whether pacemaker / defibrillator leads are still in place. Please have this done today at  [place]. You do not need an appointment.   Follow-Up: At [facility], you and your health needs are our priority.  As part of our continuing mission to provide you with exceptional heart care, we have created designated Provider Care Teams.  These Care Teams include your primary Cardiologist (physician) and Advanced Practice Providers (APPs -  Physician Assistants and Nurse Practitioners) who all work together to provide you with the care you need, when you need it.  We recommend signing up for the patient portal called "MyChart".  Sign up information is provided on this After Visit Summary.  MyChart is used to connect with patients for Virtual Visits (Telemedicine).  Patients are able to view lab/test results, encounter notes, upcoming appointments, etc.  Non-urgent messages can be sent to your provider as well.   To learn more about what you can do with MyChart, go to [email or web address].    Your next appointment:   2 week(s)  The format for your next appointment:   In  Person  Provider:   With an APP       Signed, [name], MD  09/24/2019 6:33 PM    [address], [place], [place]  [address] Phone: [phone]; Fax: [phone]

## 2019-09-25 ENCOUNTER — Telehealth: Payer: Self-pay | Admitting: Cardiovascular Disease

## 2019-09-25 ENCOUNTER — Encounter: Payer: Self-pay | Admitting: Physician Assistant

## 2019-09-25 LAB — COMPREHENSIVE METABOLIC PANEL
ALT: 22 IU/L (ref 0–44)
AST: 21 IU/L (ref 0–40)
Albumin/Globulin Ratio: 1.1 — ABNORMAL LOW (ref 1.2–2.2)
Albumin: 3.9 g/dL (ref 3.8–4.8)
Alkaline Phosphatase: 118 IU/L — ABNORMAL HIGH (ref 39–117)
BUN/Creatinine Ratio: 14 (ref 10–24)
BUN: 47 mg/dL — ABNORMAL HIGH (ref 8–27)
Bilirubin Total: 0.3 mg/dL (ref 0.0–1.2)
CO2: 25 mmol/L (ref 20–29)
Calcium: 9.1 mg/dL (ref 8.6–10.2)
Chloride: 99 mmol/L (ref 96–106)
Creatinine, Ser: 3.26 mg/dL — ABNORMAL HIGH (ref 0.76–1.27)
GFR calc Af Amer: 21 mL/min/{1.73_m2} — ABNORMAL LOW (ref >59)
GFR calc non Af Amer: 18 mL/min/{1.73_m2} — ABNORMAL LOW (ref >59)
Globulin, Total: 3.5 g/dL (ref 1.5–4.5)
Glucose: 145 mg/dL — ABNORMAL HIGH (ref 65–99)
Potassium: 4.1 mmol/L (ref 3.5–5.2)
Sodium: 139 mmol/L (ref 134–144)
Total Protein: 7.4 g/dL (ref 6.0–8.5)

## 2019-09-25 LAB — BRAIN NATRIURETIC PEPTIDE: BNP: 92.2 pg/mL (ref 0.0–100.0)

## 2019-09-25 LAB — SEDIMENTATION RATE: Sed Rate: 39 mm/hr — ABNORMAL HIGH (ref 0–30)

## 2019-09-25 NOTE — Telephone Encounter (Signed)
Dominic [name] with [facility] calling in with Cxray results   :IMPRESSION: Streaky left lung base opacities, possible small foci of atypical or viral pneumonia.  Will notify Dr.C with results.

## 2019-09-25 NOTE — Telephone Encounter (Signed)
Dominic [name] from [place] calling with chest x ray results.

## 2019-10-07 ENCOUNTER — Ambulatory Visit (HOSPITAL_COMMUNITY): Payer: Medicare HMO | Attending: Internal Medicine

## 2019-10-07 ENCOUNTER — Telehealth (HOSPITAL_COMMUNITY): Payer: Self-pay

## 2019-10-07 ENCOUNTER — Other Ambulatory Visit: Payer: Self-pay

## 2019-10-07 DIAGNOSIS — I5042 Chronic combined systolic (congestive) and diastolic (congestive) heart failure: Secondary | ICD-10-CM

## 2019-10-07 DIAGNOSIS — Z953 Presence of xenogenic heart valve: Secondary | ICD-10-CM | POA: Insufficient documentation

## 2019-10-07 MED ORDER — PERFLUTREN LIPID MICROSPHERE
1.0000 mL | INTRAVENOUS | Status: AC | PRN
  Administered 2019-10-07: 1 mL via INTRAVENOUS

## 2019-10-07 NOTE — Telephone Encounter (Signed)
Encounter complete. 

## 2019-10-08 ENCOUNTER — Telehealth (HOSPITAL_COMMUNITY): Payer: Self-pay

## 2019-10-08 ENCOUNTER — Telehealth: Payer: Self-pay | Admitting: *Deleted

## 2019-10-08 DIAGNOSIS — I5042 Chronic combined systolic (congestive) and diastolic (congestive) heart failure: Secondary | ICD-10-CM

## 2019-10-08 MED ORDER — TORSEMIDE 20 MG PO TABS: 80.0000 mg | ORAL_TABLET | Freq: Two times a day (BID) | ORAL | 3 refills | Status: DC

## 2019-10-08 MED ORDER — POTASSIUM CHLORIDE CRYS ER 20 MEQ PO TBCR: 40.0000 meq | EXTENDED_RELEASE_TABLET | Freq: Three times a day (TID) | ORAL | 3 refills | Status: DC

## 2019-10-08 MED ORDER — AMIODARONE HCL 200 MG PO TABS: 200.0000 mg | ORAL_TABLET | Freq: Every day | ORAL | 3 refills | Status: DC

## 2019-10-08 NOTE — Telephone Encounter (Signed)
-----   Message from [name], MD sent at 10/07/2019  2:30 PM EDT ----- Heart pumping function has improved, but he does have evidence of more fluid. Please increase the torsemide to 80 mg twice daily.  He will probably need a new prescription. (However, he may prefer to skip the torsemide on the morning of his stress test, take it when he gets home) Increase the potassium to 40 mEq 3 times daily. Check a bmet in about a week please. Okay to restart amiodarone 200 mg once daily. We will follow up on the nuclear scan.

## 2019-10-08 NOTE — Telephone Encounter (Signed)
The patient and his wife have been made aware of the results and the changes. Medications list ha been updated.  Lab order will be mailed.

## 2019-10-08 NOTE — Telephone Encounter (Signed)
Encounter complete. 

## 2019-10-09 ENCOUNTER — Ambulatory Visit (HOSPITAL_COMMUNITY)
Admission: RE | Admit: 2019-10-09 | Discharge: 2019-10-09 | Disposition: A | Discharge status: Home / Self Care | Payer: Medicare HMO | Source: Ambulatory Visit | Attending: Cardiology | Admitting: Cardiology

## 2019-10-09 ENCOUNTER — Other Ambulatory Visit: Payer: Self-pay

## 2019-10-09 DIAGNOSIS — I130 Hypertensive heart and chronic kidney disease with heart failure and stage 1 through stage 4 chronic kidney disease, or unspecified chronic kidney disease: Secondary | ICD-10-CM | POA: Insufficient documentation

## 2019-10-09 DIAGNOSIS — I252 Old myocardial infarction: Secondary | ICD-10-CM | POA: Insufficient documentation

## 2019-10-09 DIAGNOSIS — J449 Chronic obstructive pulmonary disease, unspecified: Secondary | ICD-10-CM | POA: Diagnosis not present

## 2019-10-09 DIAGNOSIS — R0609 Other forms of dyspnea: Secondary | ICD-10-CM | POA: Insufficient documentation

## 2019-10-09 DIAGNOSIS — R071 Chest pain on breathing: Secondary | ICD-10-CM | POA: Diagnosis present

## 2019-10-09 DIAGNOSIS — I25118 Atherosclerotic heart disease of native coronary artery with other forms of angina pectoris: Secondary | ICD-10-CM

## 2019-10-09 DIAGNOSIS — Z6835 Body mass index (BMI) 35.0-35.9, adult: Secondary | ICD-10-CM | POA: Insufficient documentation

## 2019-10-09 DIAGNOSIS — I2510 Atherosclerotic heart disease of native coronary artery without angina pectoris: Secondary | ICD-10-CM | POA: Insufficient documentation

## 2019-10-09 DIAGNOSIS — Z95810 Presence of automatic (implantable) cardiac defibrillator: Secondary | ICD-10-CM | POA: Insufficient documentation

## 2019-10-09 DIAGNOSIS — F17200 Nicotine dependence, unspecified, uncomplicated: Secondary | ICD-10-CM | POA: Insufficient documentation

## 2019-10-09 DIAGNOSIS — Z955 Presence of coronary angioplasty implant and graft: Secondary | ICD-10-CM | POA: Diagnosis not present

## 2019-10-09 DIAGNOSIS — R9439 Abnormal result of other cardiovascular function study: Secondary | ICD-10-CM | POA: Diagnosis not present

## 2019-10-09 DIAGNOSIS — Z951 Presence of aortocoronary bypass graft: Secondary | ICD-10-CM | POA: Insufficient documentation

## 2019-10-09 DIAGNOSIS — Z8673 Personal history of transient ischemic attack (TIA), and cerebral infarction without residual deficits: Secondary | ICD-10-CM | POA: Diagnosis not present

## 2019-10-09 DIAGNOSIS — E669 Obesity, unspecified: Secondary | ICD-10-CM | POA: Insufficient documentation

## 2019-10-09 DIAGNOSIS — N184 Chronic kidney disease, stage 4 (severe): Secondary | ICD-10-CM | POA: Insufficient documentation

## 2019-10-09 DIAGNOSIS — E1122 Type 2 diabetes mellitus with diabetic chronic kidney disease: Secondary | ICD-10-CM | POA: Diagnosis not present

## 2019-10-09 DIAGNOSIS — I5040 Unspecified combined systolic (congestive) and diastolic (congestive) heart failure: Secondary | ICD-10-CM | POA: Diagnosis not present

## 2019-10-09 MED ORDER — AMINOPHYLLINE 25 MG/ML IV SOLN
125.0000 mg | Freq: Once | INTRAVENOUS | Status: AC
  Administered 2019-10-09: 125 mg via INTRAVENOUS

## 2019-10-09 MED ORDER — TECHNETIUM TC 99M TETROFOSMIN IV KIT
31.1000 | PACK | Freq: Once | INTRAVENOUS | Status: AC | PRN
  Administered 2019-10-09: 31.1 via INTRAVENOUS
  Filled 2019-10-09: qty 32

## 2019-10-09 MED ORDER — REGADENOSON 0.4 MG/5ML IV SOLN
0.4000 mg | Freq: Once | INTRAVENOUS | Status: AC
  Administered 2019-10-09: 0.4 mg via INTRAVENOUS

## 2019-10-10 ENCOUNTER — Telehealth: Payer: Self-pay | Admitting: *Deleted

## 2019-10-10 ENCOUNTER — Ambulatory Visit (HOSPITAL_COMMUNITY)
Admission: RE | Admit: 2019-10-10 | Discharge: 2019-10-10 | Disposition: A | Discharge status: Home / Self Care | Payer: Medicare HMO | Source: Ambulatory Visit | Attending: Cardiovascular Disease | Admitting: Cardiovascular Disease

## 2019-10-10 LAB — MYOCARDIAL PERFUSION IMAGING
LV dias vol: 124 mL (ref 62–150)
LV sys vol: 64 mL
Peak HR: 81 {beats}/min
Rest HR: 68 {beats}/min
SDS: 3
SRS: 0
SSS: 3
TID: 0.75

## 2019-10-10 MED ORDER — TECHNETIUM TC 99M TETROFOSMIN IV KIT
32.0000 | PACK | Freq: Once | INTRAVENOUS | Status: AC | PRN
  Administered 2019-10-10: 32 via INTRAVENOUS

## 2019-10-10 NOTE — Telephone Encounter (Signed)
-----   Message from [name], MD sent at 10/10/2019  5:58 PM EDT ----- His nuclear stress test does show a area of ischemia involving the lateral wall. However, the risk to his kidney function would be higher than benefit from heart catheterization and revascularization. I am pretty sure that if we did a heart catheterization he would end up on dialysis. We will try to treat this by adjusting his medications. Please start isosorbide mononitrate 30 mg once daily. Can adjust medications further at his follow-up with Isaac Laud on May 4

## 2019-10-10 NOTE — Telephone Encounter (Signed)
Left a message for the patient to call back.  

## 2019-10-14 ENCOUNTER — Encounter: Payer: Self-pay | Admitting: Physician Assistant

## 2019-10-14 ENCOUNTER — Other Ambulatory Visit: Payer: Self-pay

## 2019-10-14 ENCOUNTER — Ambulatory Visit: Payer: Medicare HMO | Admitting: Physician Assistant

## 2019-10-14 VITALS — BP 126/70 | HR 80 | Temp 98.2°F | Ht 71.5 in | Wt 265.0 lb

## 2019-10-14 DIAGNOSIS — I5042 Chronic combined systolic (congestive) and diastolic (congestive) heart failure: Secondary | ICD-10-CM | POA: Diagnosis not present

## 2019-10-14 DIAGNOSIS — R9439 Abnormal result of other cardiovascular function study: Secondary | ICD-10-CM

## 2019-10-14 DIAGNOSIS — N184 Chronic kidney disease, stage 4 (severe): Secondary | ICD-10-CM

## 2019-10-14 DIAGNOSIS — Z953 Presence of xenogenic heart valve: Secondary | ICD-10-CM

## 2019-10-14 DIAGNOSIS — I480 Paroxysmal atrial fibrillation: Secondary | ICD-10-CM

## 2019-10-14 DIAGNOSIS — Z95810 Presence of automatic (implantable) cardiac defibrillator: Secondary | ICD-10-CM

## 2019-10-14 DIAGNOSIS — I255 Ischemic cardiomyopathy: Secondary | ICD-10-CM

## 2019-10-14 DIAGNOSIS — J449 Chronic obstructive pulmonary disease, unspecified: Secondary | ICD-10-CM

## 2019-10-14 DIAGNOSIS — E119 Type 2 diabetes mellitus without complications: Secondary | ICD-10-CM

## 2019-10-14 DIAGNOSIS — E1122 Type 2 diabetes mellitus with diabetic chronic kidney disease: Secondary | ICD-10-CM

## 2019-10-14 MED ORDER — ISOSORBIDE MONONITRATE ER 30 MG PO TB24: 30.0000 mg | ORAL_TABLET | Freq: Every day | ORAL | 3 refills | Status: DC

## 2019-10-14 NOTE — Telephone Encounter (Signed)
Patient seen in clinic on 10/14/19

## 2019-10-14 NOTE — Patient Instructions (Signed)
Medication Instructions:   START Imdur 30 mg daily   *If you need a refill on your cardiac medications before your next appointment, please call your pharmacy*   Lab Work: You will need to have labs (blood work) drawn today that was previously ordered:  BMET  TSH  If you have labs (blood work) drawn today and your tests are completely normal, you will receive your results only by: [name] MyChart Message (if you have MyChart) OR . A paper copy in the mail If you have any lab test that is abnormal or we need to change your treatment, we will call you to review the results.   Testing/Procedures: NONE ordered at this time of appointment   Follow-Up: At [facility], you and your health needs are our priority.  As part of our continuing mission to provide you with exceptional heart care, we have created designated Provider Care Teams.  These Care Teams include your primary Cardiologist (physician) and Advanced Practice Providers (APPs -  Physician Assistants and Nurse Practitioners) who all work together to provide you with the care you need, when you need it.  We recommend signing up for the patient portal called "MyChart".  Sign up information is provided on this After Visit Summary.  MyChart is used to connect with patients for Virtual Visits (Telemedicine).  Patients are able to view lab/test results, encounter notes, upcoming appointments, etc.  Non-urgent messages can be sent to your provider as well.   To learn more about what you can do with MyChart, go to [email or web address].    Your next appointment:   2-3 week(s)  The format for your next appointment:   In Person  Provider:   Almyra Deforest, PA-C  Other Instructions

## 2019-10-14 NOTE — Progress Notes (Signed)
Cardiology Office Note:    Date:  10/16/2019   ID:  Dominic [name], DOB [birth year], MRN [id number]  PCP:  Dominic [name]., MD  Cardiologist:  Dominic Klein, MD  Electrophysiologist:  None   Referring MD: Dominic [name]*   Chief Complaint  Patient presents with  . Follow-up    seen for Dominic [name]    History of Present Illness:    Dominic [name] is a 69 y.o. 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  She has a right forearm AV fistula however has not started using this yet.  He has had very longstanding history of coronary artery disease and had at least 9 cardiac catheterizations and 5 stents.  He also had at least 2 episodes of cardiac arrest in the past.  He underwent bypass surgery in [place] in 2005 was mitral valve replacement with a bioprosthetic valve.  He had postoperative atrial fibrillation and on a low-dose amiodarone but not on any anticoagulation therapy.  He had previous stent placed in LAD, bare-metal stent to the ostium left circumflex artery in September 2015.  He also underwent placement of 2 drug-eluting stents to RCA and left circumflex at [facility] in October 2016.  Previous echocardiogram in October 2016 was 30% with moderate AS.  Echocardiogram in May 2017 showed improved EF of 45 to 50%, mitral valve prosthesis.  Right heart cath in May 2017 showed right atrial pressure 8, mean wedge pressure 6, cardiac index 2.2.  Recently, he was seen by Dominic [name] on 09/24/2019 at which time he complained of worsening exertional dyspnea and intermittent chest discomfort.  Amiodarone was discontinued.  A chest x-ray was obtained which came back normal.  Torsemide was increased to 80 mg twice daily.  Potassium increased to  40 mEq 3 times daily.  Amiodarone was restarted.  Given his significant history of severe coronary artery disease, a echo and Myoview was also recommended.  Echocardiogram performed on 10/07/2019 showed EF 55 to 60%, grade 1 DD, stable bioprosthetic mitral valve with mild MR, mild to moderate AI was mild aortic stenosis, dilated aortic root measuring at 41 mm.  Subsequent Myoview obtained on 10/07/2019 showed EF 49%, small defect of mild severity in the basal anterolateral, mid anterolateral, and apical location.  Patient presents today for cardiology office visit. Since the last visit, he has not had any significant chest pain. We discussed various options, I recommended addition of low-dose Imdur 30 mg daily. Otherwise he has no significant lower extremity edema, orthopnea or PND. After the last Myoview, Dominic [name] recommended increase torsemide to 80 mg twice daily and also increase potassium to 40 mEq 3 times daily. He was restarted on amiodarone therapy. He will need basic metabolic panel today as well.   Past Medical History:  Diagnosis Date  . Biventricular ICD (implantable cardioverter-defibrillator) - [facility] 09/05/2015   Jul 01, 2015 [facility] Dynagen X4  . CAD (coronary artery disease) 09/05/2015   CABG 2015 PCI-BMS ostial LCX Sept 2015 04/05/2015 cath 50% distal LM, 90% prox LAD, old stent prox LAD 70% ISR, patent LIMA-LAD, 80% prox RCA PCI-DES x2 RCA and LCX   . Chronic systolic CHF (congestive heart failure) ([place]) 09/05/2015   a. Dominic [name] 10/29/15: PCW 8, CI 2.2. Overall low filling presssures. Mean PA 20; b.  echo 10/30/15: EF 45-50%, not tech suff to allwo for LV diastolic fxn, mild AI, nl appearing MVR  . CKD stage 4 due to type 2 diabetes mellitus ([place]) 09/05/2015  . Diabetes mellitus, type 2 ([place]) 09/05/2015  . Essential hypertension 09/05/2015  . History of mitral valve replacement with bioprosthetic valve 09/05/2015   31 mm Edwards 2015, Dr. Jerelene [name]  . Postoperative atrial fibrillation ([place]) 09/05/2015     Past Surgical History:  Procedure Laterality Date  . CARDIAC CATHETERIZATION N/A 10/29/2015   Procedure: Right Heart Cath;  Surgeon: Dominic Artist, MD;  Location: [facility];  Service: Cardiovascular;  Laterality: N/A;  . CARDIAC VALVE REPLACEMENT  2015   71mm Edwards bioprosthesis  . CORONARY ANGIOPLASTY    . CORONARY ARTERY BYPASS GRAFT  2015   [facility], Dr. Jerelene [name].    Current Medications: No outpatient medications have been marked as taking for the 10/14/19 encounter (Office Visit) with Dominic [name], [place].     Allergies:   Hydrochlorothiazide, Amoxicillin-pot clavulanate, Clindamycin, Lisinopril, Meloxicam, Topiramate, and Tamsulosin   Social History   Socioeconomic History  . Marital status: Married    Spouse name: Not on file  . Number of children: Not on file  . Years of education: Not on file  . Highest education level: Not on file  Occupational History  . Not on file  Tobacco Use  . Smoking status: Current Some Day Smoker    Packs/day: 0.50    Years: 40.00    Pack years: 20.00  . Smokeless tobacco: Never Used  Substance and Sexual Activity  . Alcohol use: No    Alcohol/week: 0.0 standard drinks  . Drug use: No  . Sexual activity: Not Currently  Other Topics Concern  . Not on file  Social History Narrative  . Not on file   Social Determinants of Health   Financial Resource Strain:   . Difficulty of Paying Living Expenses:   Food Insecurity:   . Worried About Charity fundraiser in the Last Year:   . Arboriculturist in the Last Year:   Transportation Needs:   . Film/video editor (Medical):   [name] Lack of Transportation (Non-Medical):   Physical Activity:   . Days of Exercise per Week:   . Minutes of Exercise per Session:   Stress:   . Feeling of Stress :   Social Connections:   . Frequency of Communication with Friends and Family:   . Frequency of Social Gatherings with Friends and Family:   . Attends Religious Services:   . Active Member of  Clubs or Organizations:   . Attends Archivist Meetings:   [name] Marital Status:      Family History: The patient's family history includes Hyperlipidemia in his father; Hypertension in his father.  ROS:   Please see the history of present illness.     All other systems reviewed and are negative.  EKGs/Labs/Other Studies Reviewed:    The following studies were reviewed today:  Echo 10/07/2019 1. Left ventricular ejection fraction, by estimation, is 55 to 60%. The  left ventricle has normal function. The left ventricle has no regional  wall motion abnormalities. There is moderate left ventricular hypertrophy.  Left ventricular diastolic  parameters are consistent with Grade I diastolic dysfunction (impaired  relaxation). Elevated left ventricular end-diastolic pressure.  2. Right ventricular systolic function is normal. The right ventricular  size is normal. A AICD wire is visualized. There is normal pulmonary  artery systolic pressure.  3. The mitral valve has been repaired/replaced. Mild mitral valve  regurgitation. The mean mitral valve gradient is 4.0 mmHg with average  heart rate of 70 bpm. There is a bioprosthetic valve present in the mitral  position. Echo findings are consistent  with normal structure and function of the mitral valve prosthesis.  4. The aortic valve is tricuspid. Aortic valve regurgitation is mild to  moderate. Mild aortic valve stenosis. Aortic valve area, by VTI measures  2.14 cm. Aortic valve mean gradient measures 11.8 mmHg. Aortic valve Vmax  measures 2.27 m/s.  5. Aortic dilatation noted. There is mild dilatation of the aortic root  measuring 41 mm.  6. The inferior vena cava is normal in size with greater than 50%  respiratory variability, suggesting right atrial pressure of 3 mmHg.   Myoview 10/10/2019  Nuclear stress EF: 49%.  The left ventricular ejection fraction is mildly decreased (45-54%).  Defect 1: There is a small  defect of mild severity present in the basal anterolateral, mid anterolateral and apical lateral location.  This is an intermediate risk study.   1. Reversible basal to apical anterolateral perfusion defect consistent with ischemia. 2. Septal hypokinesis, which may be due to paced rhythm   EKG:  EKG is not ordered today.    Recent Labs: 09/24/2019: ALT 22; BNP 92.2 10/14/2019: BUN 47; Creatinine, Ser 3.31; Potassium 3.7; Sodium 138; TSH 3.150  Recent Lipid Panel    Component Value Date/Time   CHOL 108 (L) 03/10/2016 0925   TRIG 228 (H) 03/10/2016 0925   HDL 28 (L) 03/10/2016 0925   CHOLHDL 3.9 03/10/2016 0925   VLDL 46 (H) 03/10/2016 0925   LDLCALC 34 03/10/2016 0925    Physical Exam:    VS:  BP 126/70   Pulse 80   Temp 98.2 F (36.8 C)   Ht 5' 11.5" (1.816 m)   Wt 265 lb (120.2 kg)   SpO2 95%   BMI 36.44 kg/m     Wt Readings from Last 3 Encounters:  10/14/19 265 lb (120.2 kg)  10/09/19 265 lb (120.2 kg)  09/24/19 265 lb (120.2 kg)     GEN:  Well nourished, well developed in no acute distress HEENT: Normal NECK: No JVD; No carotid bruits LYMPHATICS: No lymphadenopathy CARDIAC: RRR, no murmurs, rubs, gallops RESPIRATORY:  Clear to auscultation without rales, wheezing or rhonchi  ABDOMEN: Soft, non-tender, non-distended MUSCULOSKELETAL:  No edema; No deformity  SKIN: Warm and dry NEUROLOGIC:  Alert and oriented x 3 PSYCHIATRIC:  Normal affect   ASSESSMENT:    1. Abnormal stress test   2. Chronic combined systolic and diastolic heart failure ([place])   3. Ischemic cardiomyopathy   4. Presence of cardiac resynchronization therapy defibrillator (CRT-D)   5. Chronic obstructive pulmonary disease, unspecified COPD type ([place])   6. History of mitral valve replacement with bioprosthetic valve   7. CKD stage 4 due to type 2 diabetes mellitus ([place])   8. Controlled type 2 diabetes mellitus without complication, without long-term current use of insulin ([place])   9. PAF  (paroxysmal atrial fibrillation) (H[facility])    PLAN:    In order of problems listed above:  1. Abnormal stress test: Recent Myoview showed small defect of mild severity in the basal anterolateral, mid anterolateral and apical location. I started the patient 30 mg of Imdur. He will monitor his blood pressure and call us if systolic blood pressure is less than 105 mmHg  2. Chronic combined systolic and  diastolic heart failure: Torsemide and potassium recently increased. Will obtain basic metabolic panel today  3. Ischemic cardiomyopathy s/p CRT-D: Followed by Dominic [name]  4. COPD: No sign of exacerbation  5. History of mitral valve replacement: Stable on last echocardiogram  6. CKD stage IV: He would be high risk for any procedure that involve contrast. Obtain basic metabolic panel today  7. DM2: Managed by primary care provider  8. PAF: History of postop atrial fibrillation. Continue amiodarone therapy.   Medication Adjustments/Labs and Tests Ordered: Current medicines are reviewed at length with the patient today.  Concerns regarding medicines are outlined above.  No orders of the defined types were placed in this encounter.  Meds ordered this encounter  Medications  . isosorbide mononitrate (IMDUR) 30 MG 24 hr tablet    Sig: Take 1 tablet (30 mg total) by mouth daily.    Dispense:  90 tablet    Refill:  3    Patient Instructions  Medication Instructions:   START Imdur 30 mg daily   *If you need a refill on your cardiac medications before your next appointment, please call your pharmacy*   Lab Work: You will need to have labs (blood work) drawn today that was previously ordered:  BMET  TSH  If you have labs (blood work) drawn today and your tests are completely normal, you will receive your results only by: [name] MyChart Message (if you have MyChart) OR . A paper copy in the mail If you have any lab test that is abnormal or we need to change your treatment, we will call  you to review the results.   Testing/Procedures: NONE ordered at this time of appointment   Follow-Up: At [facility], you and your health needs are our priority.  As part of our continuing mission to provide you with exceptional heart care, we have created designated Provider Care Teams.  These Care Teams include your primary Cardiologist (physician) and Advanced Practice Providers (APPs -  Physician Assistants and Nurse Practitioners) who all work together to provide you with the care you need, when you need it.  We recommend signing up for the patient portal called "MyChart".  Sign up information is provided on this After Visit Summary.  MyChart is used to connect with patients for Virtual Visits (Telemedicine).  Patients are able to view lab/test results, encounter notes, upcoming appointments, etc.  Non-urgent messages can be sent to your provider as well.   To learn more about what you can do with MyChart, go to [email or web address].    Your next appointment:   2-3 week(s)  The format for your next appointment:   In Person  Provider:   Almyra Deforest, PA-C  Other Instructions      Signed, Dominic [name], [place]  10/16/2019 11:55 PM    [place]

## 2019-10-15 LAB — BASIC METABOLIC PANEL
BUN/Creatinine Ratio: 14 (ref 10–24)
BUN: 47 mg/dL — ABNORMAL HIGH (ref 8–27)
CO2: 23 mmol/L (ref 20–29)
Calcium: 9.6 mg/dL (ref 8.6–10.2)
Chloride: 97 mmol/L (ref 96–106)
Creatinine, Ser: 3.31 mg/dL — ABNORMAL HIGH (ref 0.76–1.27)
GFR calc Af Amer: 21 mL/min/{1.73_m2} — ABNORMAL LOW (ref >59)
GFR calc non Af Amer: 18 mL/min/{1.73_m2} — ABNORMAL LOW (ref >59)
Glucose: 129 mg/dL — ABNORMAL HIGH (ref 65–99)
Potassium: 3.7 mmol/L (ref 3.5–5.2)
Sodium: 138 mmol/L (ref 134–144)

## 2019-10-15 LAB — TSH: TSH: 3.15 u[IU]/mL (ref 0.450–4.500)

## 2019-10-15 NOTE — Progress Notes (Signed)
Seen in the office, Imdur added, will reassess on followup. He is aware that low BP may worsen kidney function and will contact cardiology if Imdur drop is SBP < 105 mmHg

## 2019-10-16 ENCOUNTER — Telehealth: Payer: Self-pay | Admitting: Cardiovascular Disease

## 2019-10-16 ENCOUNTER — Encounter: Payer: Self-pay | Admitting: *Deleted

## 2019-10-16 ENCOUNTER — Encounter: Payer: Self-pay | Admitting: Physician Assistant

## 2019-10-16 NOTE — Telephone Encounter (Signed)
Spoke to wife   Result given   [facility] labs will be sent to kidney doctor   Dr [name]  At [address]

## 2019-10-16 NOTE — Telephone Encounter (Signed)
New Message  Pt's wife would like for someone to contact her regarding her husband's results.  Please call to discuss

## 2019-11-07 ENCOUNTER — Other Ambulatory Visit: Payer: Self-pay

## 2019-11-07 ENCOUNTER — Encounter: Payer: Self-pay | Admitting: Physician Assistant

## 2019-11-07 ENCOUNTER — Ambulatory Visit: Payer: Medicare HMO | Admitting: Physician Assistant

## 2019-11-07 VITALS — BP 130/60 | HR 71 | Temp 97.9°F | Ht 71.0 in | Wt 264.4 lb

## 2019-11-07 DIAGNOSIS — I5042 Chronic combined systolic (congestive) and diastolic (congestive) heart failure: Secondary | ICD-10-CM | POA: Diagnosis not present

## 2019-11-07 DIAGNOSIS — I2510 Atherosclerotic heart disease of native coronary artery without angina pectoris: Secondary | ICD-10-CM | POA: Diagnosis not present

## 2019-11-07 DIAGNOSIS — Z95810 Presence of automatic (implantable) cardiac defibrillator: Secondary | ICD-10-CM | POA: Diagnosis not present

## 2019-11-07 DIAGNOSIS — Z9889 Other specified postprocedural states: Secondary | ICD-10-CM

## 2019-11-07 DIAGNOSIS — E1122 Type 2 diabetes mellitus with diabetic chronic kidney disease: Secondary | ICD-10-CM

## 2019-11-07 DIAGNOSIS — I480 Paroxysmal atrial fibrillation: Secondary | ICD-10-CM

## 2019-11-07 DIAGNOSIS — J449 Chronic obstructive pulmonary disease, unspecified: Secondary | ICD-10-CM

## 2019-11-07 DIAGNOSIS — N185 Chronic kidney disease, stage 5: Secondary | ICD-10-CM

## 2019-11-07 NOTE — Patient Instructions (Signed)
Medication Instructions:  The current medical regimen is effective;  continue present plan and medications as directed. Please refer to the Current Medication list given to you today. *If you need a refill on your cardiac medications before your next appointment, please call your pharmacy*  Follow-Up: Your next appointment:  3 month(s)  In Person with [name], MD  At [facility] you and your health needs are our priority.  As part of our continuing mission to provide you with exceptional heart care, we have created designated Provider Care Teams.  These Care Teams include your primary Cardiologist (physician) and Advanced Practice Providers (APPs -  Physician Assistants and Nurse Practitioners) who all work together to provide you with the care you need, when you need it.  We recommend signing up for the patient portal called "MyChart".  Sign up information is provided on this After Visit Summary.  MyChart is used to connect with patients for Virtual Visits (Telemedicine).  Patients are able to view lab/test results, encounter notes, upcoming appointments, etc.  Non-urgent messages can be sent to your provider as well.   To learn more about what you can do with MyChart, go to [email or web address].

## 2019-11-07 NOTE — Progress Notes (Signed)
Cardiology Office Note:    Date:  11/09/2019   ID:  [name], DOB 1950-12-23, MRN [id number]  PCP:  Frederic Jericho Estevan Ryder., MD  Cardiologist:  [name], MD  Electrophysiologist:  None   Referring MD: Frederic Jericho Estevan Ryder*   Chief Complaint  Patient presents with  . Follow-up    seen for Dr. Sallyanne Kuster    History of Present Illness:    Dominic [name] is a 69 y.o. 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  She has a right forearm AV fistula however has not started using this yet.  He has had very longstanding history of coronary artery disease and had at least 9 cardiac catheterizations and 5 stents.  He also had at least 2 episodes of cardiac arrest in the past.  He underwent bypass surgery in [place] in 2005 was mitral valve replacement with a bioprosthetic valve.  He had postoperative atrial fibrillation and on a low-dose amiodarone but not on any anticoagulation therapy.  He had previous stent placed in LAD, bare-metal stent to the ostium left circumflex artery in September 2015.  He also underwent placement of 2 drug-eluting stents to RCA and left circumflex at [facility] in October 2016.  Previous echocardiogram in October 2016 was 30% with moderate AS.  Echocardiogram in May 2017 showed improved EF of 45 to 50%, mitral valve prosthesis.  Right heart cath in May 2017 showed right atrial pressure 8, mean wedge pressure 6, cardiac index 2.2.  Recently, he was seen by Dr. Sallyanne Kuster on 09/24/2019 at which time he complained of worsening exertional dyspnea and intermittent chest discomfort.  Amiodarone was discontinued.  A chest x-ray was obtained which came back normal.  Torsemide was increased to 80 mg twice daily.  Potassium increased  to 40 mEq 3 times daily.  Amiodarone was restarted.  Given his significant history of severe coronary artery disease, a echo and Myoview was also recommended.  Echocardiogram performed on 10/07/2019 showed EF 55 to 60%, grade 1 DD, stable bioprosthetic mitral valve with mild MR, mild to moderate AI was mild aortic stenosis, dilated aortic root measuring at 41 mm.  Subsequent Myoview obtained on 10/07/2019 showed EF 49%, small defect of mild severity in the basal anterolateral, mid anterolateral, and apical location.  Patient presents today for follow-up.  Most recent lab work obtained by his nephrologist does show his creatinine went up to 3.9.  He is now in stage V CKD.  He was seen by his nephrologist on 5/25, he has not started on dialysis yet.  He is still making urine and get up about 2-3 times a day to urinate.  Otherwise he has no lower extremity edema, orthopnea or PND.  Since addition of Imdur, he has not had any significant chest discomfort or shortness of breath.  His blood pressure is very stable at 130/60.  I recommended continue on the current therapy and follow-up with Dr. Sallyanne Kuster in 3 months.   Past Medical History:  Diagnosis Date  . Biventricular ICD (implantable cardioverter-defibrillator) - [facility] 09/05/2015   Jul 01, 2015 [facility] Dynagen X4  . CAD (coronary artery disease) 09/05/2015   CABG 2015 PCI-BMS ostial LCX Sept 2015 04/05/2015 cath 50% distal LM, 90% prox LAD, old stent prox LAD 70% ISR, patent LIMA-LAD, 80% prox RCA  PCI-DES x2 RCA and LCX   . Chronic systolic CHF (congestive heart failure) ([place]) 09/05/2015   a. [place] 10/29/15: PCW 8, CI 2.2. Overall low filling presssures. Mean PA 20; b. echo 10/30/15: EF 45-50%, not tech suff to allwo for LV diastolic fxn, mild AI, nl appearing MVR  . CKD stage 4 due to type 2 diabetes mellitus ([place]) 09/05/2015  . Diabetes mellitus, type 2 ([place]) 09/05/2015  . Essential hypertension 09/05/2015  . History of mitral valve replacement with bioprosthetic  valve 09/05/2015   31 mm Edwards 2015, Dr. Jerelene Redden  . Postoperative atrial fibrillation ([place]) 09/05/2015    Past Surgical History:  Procedure Laterality Date  . CARDIAC CATHETERIZATION N/A 10/29/2015   Procedure: Right Heart Cath;  Surgeon: Jolaine Artist, MD;  Location: [place] [facility];  Service: Cardiovascular;  Laterality: N/A;  . CARDIAC VALVE REPLACEMENT  2015   44mm Edwards bioprosthesis  . CORONARY ANGIOPLASTY    . CORONARY ARTERY BYPASS GRAFT  2015   [facility], Dr. Jerelene Redden.    Current Medications: Current Meds  Medication Sig  . albuterol (PROVENTIL HFA;VENTOLIN HFA) 108 (90 Base) MCG/ACT inhaler Inhale 2 puffs into the lungs every 6 (six) hours as needed for wheezing or shortness of breath.  [name] amiodarone (PACERONE) 200 MG tablet Take 1 tablet (200 mg total) by mouth daily.  [name] aspirin 81 MG tablet Take 81 mg by mouth daily.  [name] atorvastatin (LIPITOR) 10 MG tablet TAKE 1 TABLET EVERY DAY (Patient taking differently: Take 20 mg by mouth daily. )  . bisacodyl (DULCOLAX) 5 MG EC tablet Take 5 mg by mouth daily as needed for moderate constipation.  . Cholecalciferol (VITAMIN D HIGH POTENCY PO) Take 2,000 Units by mouth daily.  . clopidogrel (PLAVIX) 75 MG tablet Take 75 mg by mouth daily.  . DULoxetine (CYMBALTA) 60 MG capsule Take 60 mg by mouth daily.   . fluticasone (FLONASE) 50 MCG/ACT nasal spray Place 1 spray into both nostrils daily.  . Fluticasone-Salmeterol 55-14 MCG/ACT AEPB Inhale 1 puff into the lungs 2 (two) times daily.  [name] glipiZIDE (GLUCOTROL) 2.5 mg TABS tablet Take 2.5 mg by mouth daily before breakfast.  . glucose blood test strip 1 each by Other route as needed for other. Use as instructed  . HYDROcodone-acetaminophen (NORCO/VICODIN) 5-325 MG tablet TAKE 1 TABLET BY MOUTH EVERY 6 HOURS AS NEEDED FOR UP TO 5 DAYS  . isosorbide mononitrate (IMDUR) 30 MG 24 hr tablet Take 1 tablet (30 mg total) by mouth daily.  . metolazone (ZAROXOLYN) 2.5 MG tablet TAKE 1 TABLET BY MOUTH  EVERY DAY AS NEEDED FOR WEIGHT GAIN GREATER THAN 4 POUNDS  . metoprolol succinate (TOPROL-XL) 25 MG 24 hr tablet Take 50 mg by mouth daily.   . mirtazapine (REMERON) 7.5 MG tablet Take 7.5 mg by mouth at bedtime.  . nitroGLYCERIN (NITROSTAT) 0.4 MG SL tablet Place 0.4 mg under the tongue every 5 (five) minutes as needed for chest pain.  . potassium chloride SA (KLOR-CON) 20 MEQ tablet Take 2 tablets (40 mEq total) by mouth 3 (three) times daily.  . sucralfate (CARAFATE) 1 g tablet Take 1 g by mouth 3 (three) times daily.   [name] tiZANidine (ZANAFLEX) 2 MG tablet Take 1 tablet by mouth 2 (two) times daily as needed for pain.  [name] topiramate (TOPAMAX) 25 MG tablet Take 50 mg by mouth 2 (two) times daily.   [name] torsemide (DEMADEX) 20 MG tablet Take 4 tablets (80 mg total) by mouth 2 (two)  times daily.  . vitamin B-12 (CYANOCOBALAMIN) 1000 MCG tablet Take 1,000 mcg by mouth daily.     Allergies:   Hydrochlorothiazide, Amoxicillin-pot clavulanate, Clindamycin, Lisinopril, Meloxicam, Topiramate, and Tamsulosin   Social History   Socioeconomic History  . Marital status: Married    Spouse name: Not on file  . Number of children: Not on file  . Years of education: Not on file  . Highest education level: Not on file  Occupational History  . Not on file  Tobacco Use  . Smoking status: Current Some Day Smoker    Packs/day: 0.50    Years: 40.00    Pack years: 20.00  . Smokeless tobacco: Never Used  Substance and Sexual Activity  . Alcohol use: No    Alcohol/week: 0.0 standard drinks  . Drug use: No  . Sexual activity: Not Currently  Other Topics Concern  . Not on file  Social History Narrative  . Not on file   Social Determinants of Health   Financial Resource Strain:   . Difficulty of Paying Living Expenses:   Food Insecurity:   . Worried About Charity fundraiser in the Last Year:   . Arboriculturist in the Last Year:   Transportation Needs:   . Film/video editor (Medical):   [name]  Lack of Transportation (Non-Medical):   Physical Activity:   . Days of Exercise per Week:   . Minutes of Exercise per Session:   Stress:   . Feeling of Stress :   Social Connections:   . Frequency of Communication with Friends and Family:   . Frequency of Social Gatherings with Friends and Family:   . Attends Religious Services:   . Active Member of Clubs or Organizations:   . Attends Archivist Meetings:   [name] Marital Status:      Family History: The patient's family history includes Hyperlipidemia in his father; Hypertension in his father.  ROS:   Please see the history of present illness.     All other systems reviewed and are negative.  EKGs/Labs/Other Studies Reviewed:    The following studies were reviewed today:  Echo 10/07/2019 1. Left ventricular ejection fraction, by estimation, is 55 to 60%. The  left ventricle has normal function. The left ventricle has no regional  wall motion abnormalities. There is moderate left ventricular hypertrophy.  Left ventricular diastolic  parameters are consistent with Grade I diastolic dysfunction (impaired  relaxation). Elevated left ventricular end-diastolic pressure.  2. Right ventricular systolic function is normal. The right ventricular  size is normal. A AICD wire is visualized. There is normal pulmonary  artery systolic pressure.  3. The mitral valve has been repaired/replaced. Mild mitral valve  regurgitation. The mean mitral valve gradient is 4.0 mmHg with average  heart rate of 70 bpm. There is a bioprosthetic valve present in the mitral  position. Echo findings are consistent  with normal structure and function of the mitral valve prosthesis.  4. The aortic valve is tricuspid. Aortic valve regurgitation is mild to  moderate. Mild aortic valve stenosis. Aortic valve area, by VTI measures  2.14 cm. Aortic valve mean gradient measures 11.8 mmHg. Aortic valve Vmax  measures 2.27 m/s.  5. Aortic dilatation  noted. There is mild dilatation of the aortic root  measuring 41 mm.  6. The inferior vena cava is normal in size with greater than 50%  respiratory variability, suggesting right atrial pressure of 3 mmHg.   Comparison(s): 10/30/15 EF 45-50%.  Myoview 10/10/2019  Nuclear stress EF: 49%.  The left ventricular ejection fraction is mildly decreased (45-54%).  Defect 1: There is a small defect of mild severity present in the basal anterolateral, mid anterolateral and apical lateral location.  This is an intermediate risk study.   1. Reversible basal to apical anterolateral perfusion defect consistent with ischemia. 2. Septal hypokinesis, which may be due to paced rhythm   EKG:  EKG is not ordered today.   Recent Labs: 09/24/2019: ALT 22; BNP 92.2 10/14/2019: BUN 47; Creatinine, Ser 3.31; Potassium 3.7; Sodium 138; TSH 3.150  Recent Lipid Panel    Component Value Date/Time   CHOL 108 (L) 03/10/2016 0925   TRIG 228 (H) 03/10/2016 0925   HDL 28 (L) 03/10/2016 0925   CHOLHDL 3.9 03/10/2016 0925   VLDL 46 (H) 03/10/2016 0925   LDLCALC 34 03/10/2016 0925    Physical Exam:    VS:  BP 130/60   Pulse 71   Temp 97.9 F (36.6 C)   Ht 5\' 11"  (1.478 m)   Wt 264 lb 6.4 oz (119.9 kg)   SpO2 94%   BMI 36.88 kg/m     Wt Readings from Last 3 Encounters:  11/07/19 264 lb 6.4 oz (119.9 kg)  10/14/19 265 lb (120.2 kg)  10/09/19 265 lb (120.2 kg)     GEN:  Well nourished, well developed in no acute distress HEENT: Normal NECK: No JVD; No carotid bruits LYMPHATICS: No lymphadenopathy CARDIAC: RRR, no murmurs, rubs, gallops RESPIRATORY:  Clear to auscultation without rales, wheezing or rhonchi  ABDOMEN: Soft, non-tender, non-distended MUSCULOSKELETAL:  No edema; No deformity  SKIN: Warm and dry NEUROLOGIC:  Alert and oriented x 3 PSYCHIATRIC:  Normal affect   ASSESSMENT:    1. Coronary artery disease involving native coronary artery of native heart without angina pectoris    2. Chronic combined systolic and diastolic heart failure ([place])   3. Presence of cardiac resynchronization therapy defibrillator (CRT-D)   4. Chronic obstructive pulmonary disease, unspecified COPD type ([place])   5. S/P MVR (mitral valve repair)   6. Chronic kidney disease (CKD), stage V ([place])   7. Controlled type 2 diabetes mellitus with stage 5 chronic kidney disease not on chronic dialysis, without long-term current use of insulin ([place])   8. PAF (paroxysmal atrial fibrillation) ([facility])    PLAN:    In order of problems listed above:  1. CAD: Denies any recent chest pain after starting on the low-dose Imdur.  Recent Myoview was abnormal, however we were unable to proceed with cardiac catheterization due to high risk of contrast nephropathy.  2. Chronic combined diastolic and diastolic heart failure: Euvolemic on exam.  He has not started on dialysis yet, however his renal function is progressively worsening therefore he likely will need to start on dialysis soon.  He has close outpatient follow-up with his nephrologist  3. History of ischemic cardiomyopathy status post CRT-D: Managed by Dr. Sallyanne Kuster  4. COPD: No acute exacerbation  5. History of MVR: Stable on last echocardiogram  6. CKD stage V: Managed by his nephrologist.  Unfortunately his renal function continued to worsening  7. DM2: Managed by primary care provider  8. PAF: On amiodarone.  Not currently on anticoagulation therapy yet given rare recurrence   Medication Adjustments/Labs and Tests Ordered: Current medicines are reviewed at length with the patient today.  Concerns regarding medicines are outlined above.  No orders of the defined types were placed in this encounter.  No orders  of the defined types were placed in this encounter.   Patient Instructions  Medication Instructions:  The current medical regimen is effective;  continue present plan and medications as directed. Please refer to the Current Medication list  given to you today. *If you need a refill on your cardiac medications before your next appointment, please call your pharmacy*  Follow-Up: Your next appointment:  3 month(s)  In Person with [name], MD  At [facility], you and your health needs are our priority.  As part of our continuing mission to provide you with exceptional heart care, we have created designated Provider Care Teams.  These Care Teams include your primary Cardiologist (physician) and Advanced Practice Providers (APPs -  Physician Assistants and Nurse Practitioners) who all work together to provide you with the care you need, when you need it.  We recommend signing up for the patient portal called "MyChart".  Sign up information is provided on this After Visit Summary.  MyChart is used to connect with patients for Virtual Visits (Telemedicine).  Patients are able to view lab/test results, encounter notes, upcoming appointments, etc.  Non-urgent messages can be sent to your provider as well.   To learn more about what you can do with MyChart, go to [email or web address].        Hilbert Corrigan, [place]  11/09/2019 11:51 PM    [facility]

## 2019-11-09 ENCOUNTER — Encounter: Payer: Self-pay | Admitting: Physician Assistant

## 2019-11-11 ENCOUNTER — Ambulatory Visit (INDEPENDENT_AMBULATORY_CARE_PROVIDER_SITE_OTHER): Payer: Medicare HMO | Admitting: *Deleted

## 2019-11-11 DIAGNOSIS — I5022 Chronic systolic (congestive) heart failure: Secondary | ICD-10-CM

## 2019-11-11 LAB — CUP PACEART REMOTE DEVICE CHECK
Battery Remaining Longevity: 60 mo
Battery Remaining Percentage: 85 %
Brady Statistic RA Percent Paced: 1 %
Brady Statistic RV Percent Paced: 100 %
Date Time Interrogation Session: 20210531055100
HighPow Impedance: 91 Ohm
Implantable Pulse Generator Implant Date: 20170119
Lead Channel Impedance Value: 383 Ohm
Lead Channel Impedance Value: 628 Ohm
Lead Channel Impedance Value: 675 Ohm
Lead Channel Pacing Threshold Amplitude: 1.1 V
Lead Channel Pacing Threshold Amplitude: 1.2 V
Lead Channel Pacing Threshold Amplitude: 1.3 V
Lead Channel Pacing Threshold Pulse Width: 0.4 ms
Lead Channel Pacing Threshold Pulse Width: 0.4 ms
Lead Channel Pacing Threshold Pulse Width: 0.7 ms
Lead Channel Setting Pacing Amplitude: 2 V
Lead Channel Setting Pacing Amplitude: 2.6 V
Lead Channel Setting Pacing Amplitude: 2.8 V
Lead Channel Setting Pacing Pulse Width: 0.4 ms
Lead Channel Setting Pacing Pulse Width: 0.7 ms
Lead Channel Setting Sensing Sensitivity: 0.6 mV
Lead Channel Setting Sensing Sensitivity: 1 mV
Pulse Gen Serial Number: 160713

## 2019-11-11 NOTE — Progress Notes (Signed)
Remote ICD transmission.   

## 2020-01-21 ENCOUNTER — Encounter: Payer: Self-pay | Admitting: Cardiovascular Disease

## 2020-01-21 ENCOUNTER — Ambulatory Visit (INDEPENDENT_AMBULATORY_CARE_PROVIDER_SITE_OTHER): Payer: Medicare HMO | Admitting: Cardiovascular Disease

## 2020-01-21 ENCOUNTER — Other Ambulatory Visit: Payer: Self-pay

## 2020-01-21 VITALS — BP 128/59 | HR 72 | Ht 71.0 in | Wt 260.8 lb

## 2020-01-21 DIAGNOSIS — I872 Venous insufficiency (chronic) (peripheral): Secondary | ICD-10-CM

## 2020-01-21 DIAGNOSIS — J449 Chronic obstructive pulmonary disease, unspecified: Secondary | ICD-10-CM | POA: Diagnosis not present

## 2020-01-21 DIAGNOSIS — I25118 Atherosclerotic heart disease of native coronary artery with other forms of angina pectoris: Secondary | ICD-10-CM

## 2020-01-21 DIAGNOSIS — I10 Essential (primary) hypertension: Secondary | ICD-10-CM

## 2020-01-21 DIAGNOSIS — E669 Obesity, unspecified: Secondary | ICD-10-CM

## 2020-01-21 DIAGNOSIS — I4891 Unspecified atrial fibrillation: Secondary | ICD-10-CM

## 2020-01-21 DIAGNOSIS — N185 Chronic kidney disease, stage 5: Secondary | ICD-10-CM

## 2020-01-21 DIAGNOSIS — I350 Nonrheumatic aortic (valve) stenosis: Secondary | ICD-10-CM

## 2020-01-21 DIAGNOSIS — Z79899 Other long term (current) drug therapy: Secondary | ICD-10-CM

## 2020-01-21 DIAGNOSIS — Z953 Presence of xenogenic heart valve: Secondary | ICD-10-CM

## 2020-01-21 DIAGNOSIS — Z95810 Presence of automatic (implantable) cardiac defibrillator: Secondary | ICD-10-CM

## 2020-01-21 DIAGNOSIS — Z5181 Encounter for therapeutic drug level monitoring: Secondary | ICD-10-CM

## 2020-01-21 DIAGNOSIS — E782 Mixed hyperlipidemia: Secondary | ICD-10-CM

## 2020-01-21 DIAGNOSIS — I5042 Chronic combined systolic (congestive) and diastolic (congestive) heart failure: Secondary | ICD-10-CM | POA: Diagnosis not present

## 2020-01-21 DIAGNOSIS — I9789 Other postprocedural complications and disorders of the circulatory system, not elsewhere classified: Secondary | ICD-10-CM

## 2020-01-21 DIAGNOSIS — E1169 Type 2 diabetes mellitus with other specified complication: Secondary | ICD-10-CM

## 2020-01-21 NOTE — Patient Instructions (Signed)

## 2020-01-21 NOTE — Progress Notes (Signed)
`   Cardiology Office Note    Date:  01/21/2020   ID:  Dominic [name], DOB 05-25-1951, MRN [id number]  PCP:  Dominic [name]., MD  Cardiologist:   Dominic Klein, MD   Chief Complaint  Patient presents with  . Congestive Heart Failure  . Pacemaker Check    History of Present Illness:  Dominic [name] is a 69 y.o. 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  Little change clinically since his last appointment.  Continues to have NYHA functional class II-3 exertional dyspnea but is quite sedentary.  Continues to smoke a pack of cigarettes a day.  Seeing his nephrologist and getting labs every 2 months but his creatinine seems to be stable at around 3.3.  Weight appears to be stable at around 260 pounds.  He has not had angina pectoris since the addition of isosorbide mononitrate.  Nuclear stress test in April 2021 showed a small area of lateral wall ischemia.  Noninvasive/conservative approach recommended due to his renal failure.  His last coronary angiogram was performed in 2016 when he received 2 stents.  He chronically sleeps on 3 pillows but this has not really changed.  He does not have PND or leg edema.  He denies palpitations, dizziness or syncope.  He has not had any focal neurological events.  Comprehensive device check performed today. He has a dual chamber CRT-D [employer] X4 device, implanted in 2017.  LV pacing vector is LV ring 3 to can.  Estimated generator longevity is 4.5 years.  He does not have atrial pacing and has 100% biventricular pacing.  His heart rate variability footprint looks excellent.  He is not pacemaker dependent.  All lead  parameters were manually checked today and are within normal and stable range.  His device is occasionally recorded "pacemaker mediated tachycardia" but I think it is actually sinus tachycardia at the upper rate limit.  There has been no VT and no atrial fibrillation.  He has a right arm AV fistula that is ready for hemodialysis when this should become necessary.  Mr. Dominic [name] had severe ischemic cardiomyopathy with left ventricular ejection fraction estimated to be approximately 30% by echocardiogram performed in October 2016. That echo describes septal and apical akinesis with global hypokinesis. He had evidence of restrictive filling as well as moderate right ventricular systolic dysfunction and moderate aortic insufficiency. Echo in May 2017 (after PCI and CRT) shows improved LVEF 45-50% and normal function of the mitral valve prosthesis. Right heart catheterization in May 2017 showed right atrial pressure 8, mean wedge pressure 6, cardiac index 2.2 L/minute.   He has an extensive history of coronary artery disease. He tells me that he thinks he has had 9 cardiac catheterizations and 5 stents. At least on one admission he had 2 episodes of cardiac arrest. I think this was during his hospitalization for bypass surgery/valve replacement. He underwent bypass surgery in [place] in 2015 with synchronous mitral valve replacement with a biological prosthesis. He has a history of previous stent placed in the LAD artery, type of stent and date of procedure unknown. He underwent placement of a bare metal stent to the ostium of the left circumflex coronary artery in September 2015. He subsequently underwent placement  of 2 drug-eluting stents at [facility] in October 2016, placed in the right coronary artery and left circumflex coronary artery respectively. At the time of that cardiac catheterization he had 50% stenosis in the distal left main, 90% proximal LAD, 70% in-stent restenosis proximal LAD, patent LIMA  to distal LAD, 80% RCA. No mention is made of his second bypass graft, which I presume is occluded. He is on combination aspirin/clopidogrel.  At the time of his bypass surgery received a 31 mm [employer] mitral bioprosthetic valve. By echocardiogram last October 2016 the mean transvalvular gradient was 5 mmHg. The same echocardiogram described a trileaflet unrestricted aortic valve with moderate insufficiency.  His chart describes a history of postoperative atrial fibrillation. He is not on anticoagulants. He takes low-dose amiodarone.  He has a history of left bundle branch block and he received a CRT-D [employer] device on 07/01/2015. The device and the leads are MRI conditional.  He has advanced chronic kidney disease. With recent baseline creatinine around 3.0-3.5. During his hospitalization in January 2017 the peak creatinine reached 4.6 (acute diarrheal illness), reaching a similar level in August 2017 when he had nausea and vomiting.[name] He has type 2 diabetes mellitus (not requiring insulin), complicated by kidney disease and neuropathy.  He takes atorvastatin in a relatively low dose. He has long-standing treated hypertension. He is a former smoker and has a history of COPD and takes bronchodilators. He has a history of migraine headaches treated with Fioricet. He underwent sigmoid colectomy in November 2017 for colon cancer.  Past Medical History:  Diagnosis Date  . Biventricular ICD (implantable cardioverter-defibrillator) - [facility] 09/05/2015   Jul 01, 2015 [facility] Dynagen X4  . CAD (coronary artery disease) 09/05/2015   CABG 2015 PCI-BMS ostial LCX Sept 2015 04/05/2015 cath 50% distal LM, 90% prox LAD, old stent prox LAD 70% ISR, patent LIMA-LAD, 80% prox RCA PCI-DES x2 RCA and LCX   . Chronic systolic CHF (congestive heart failure) ([place]) 09/05/2015   a. [employer] 10/29/15: PCW 8, CI 2.2. Overall low filling presssures. Mean PA 20; b. echo 10/30/15: EF 45-50%, not tech suff to allwo for LV  diastolic fxn, mild AI, nl appearing MVR  . CKD stage 4 due to type 2 diabetes mellitus ([place]) 09/05/2015  . Diabetes mellitus, type 2 ([place]) 09/05/2015  . Essential hypertension 09/05/2015  . History of mitral valve replacement with bioprosthetic valve 09/05/2015   31 mm Edwards 2015, Dr. Jerelene Redden  . Postoperative atrial fibrillation ([place]) 09/05/2015    Past Surgical History:  Procedure Laterality Date  . CARDIAC CATHETERIZATION N/A 10/29/2015   Procedure: Right Heart Cath;  Surgeon: Jolaine Artist, MD;  Location: [facility];  Service: Cardiovascular;  Laterality: N/A;  . CARDIAC VALVE REPLACEMENT  2015   29mm Edwards bioprosthesis  . CORONARY ANGIOPLASTY    . CORONARY ARTERY BYPASS GRAFT  2015   [facility], Dr. Jerelene Redden.    Current Medications: Outpatient Medications Prior to Visit  Medication Sig Dispense Refill  . albuterol (PROVENTIL HFA;VENTOLIN HFA) 108 (90 Base) MCG/ACT inhaler Inhale 2 puffs into the lungs every 6 (six) hours as needed for wheezing or shortness of breath.    [name] amiodarone (PACERONE) 200 MG tablet Take 1 tablet (200 mg total) by mouth daily. 30 tablet 3  . aspirin 81 MG tablet Take 81 mg by mouth daily.    [name] atorvastatin (LIPITOR) 10 MG tablet TAKE 1 TABLET EVERY DAY (Patient taking differently: Take 20 mg by mouth daily. )  90 tablet 2  . bisacodyl (DULCOLAX) 5 MG EC tablet Take 5 mg by mouth daily as needed for moderate constipation.    . Cholecalciferol (VITAMIN D HIGH POTENCY PO) Take 2,000 Units by mouth daily.    . clopidogrel (PLAVIX) 75 MG tablet Take 75 mg by mouth daily.    . DULoxetine (CYMBALTA) 60 MG capsule Take 60 mg by mouth daily.     . fluticasone (FLONASE) 50 MCG/ACT nasal spray Place 1 spray into both nostrils daily.    . Fluticasone-Salmeterol 55-14 MCG/ACT AEPB Inhale 1 puff into the lungs 2 (two) times daily.    [name] glipiZIDE (GLUCOTROL) 2.5 mg TABS tablet Take 2.5 mg by mouth daily before breakfast.    . glucose blood test strip 1 each by Other  route as needed for other. Use as instructed    . HYDROcodone-acetaminophen (NORCO/VICODIN) 5-325 MG tablet TAKE 1 TABLET BY MOUTH EVERY 6 HOURS AS NEEDED FOR UP TO 5 DAYS    . isosorbide mononitrate (IMDUR) 30 MG 24 hr tablet Take 1 tablet (30 mg total) by mouth daily. 90 tablet 3  . metolazone (ZAROXOLYN) 2.5 MG tablet TAKE 1 TABLET BY MOUTH EVERY DAY AS NEEDED FOR WEIGHT GAIN GREATER THAN 4 POUNDS 90 tablet 0  . metoprolol succinate (TOPROL-XL) 25 MG 24 hr tablet Take 50 mg by mouth daily.     . mirtazapine (REMERON) 7.5 MG tablet Take 7.5 mg by mouth at bedtime.    . nitroGLYCERIN (NITROSTAT) 0.4 MG SL tablet Place 0.4 mg under the tongue every 5 (five) minutes as needed for chest pain.    . potassium chloride SA (KLOR-CON) 20 MEQ tablet Take 2 tablets (40 mEq total) by mouth 3 (three) times daily. 180 tablet 3  . sucralfate (CARAFATE) 1 g tablet Take 1 g by mouth 3 (three) times daily.     [name] tiZANidine (ZANAFLEX) 2 MG tablet Take 1 tablet by mouth 2 (two) times daily as needed for pain.    [name] topiramate (TOPAMAX) 25 MG tablet Take 50 mg by mouth 2 (two) times daily.     [name] torsemide (DEMADEX) 20 MG tablet Take 4 tablets (80 mg total) by mouth 2 (two) times daily. 240 tablet 3  . vitamin B-12 (CYANOCOBALAMIN) 1000 MCG tablet Take 1,000 mcg by mouth daily.     No facility-administered medications prior to visit.     Allergies:   Hydrochlorothiazide, Amoxicillin-pot clavulanate, Clindamycin, Lisinopril, Meloxicam, Topiramate, and Tamsulosin   Social History   Socioeconomic History  . Marital status: Married    Spouse name: Not on file  . Number of children: Not on file  . Years of education: Not on file  . Highest education level: Not on file  Occupational History  . Not on file  Tobacco Use  . Smoking status: Current Some Day Smoker    Packs/day: 0.50    Years: 40.00    Pack years: 20.00  . Smokeless tobacco: Never Used  Substance and Sexual Activity  . Alcohol use: No     Alcohol/week: 0.0 standard drinks  . Drug use: No  . Sexual activity: Not Currently  Other Topics Concern  . Not on file  Social History Narrative  . Not on file   Social Determinants of Health   Financial Resource Strain:   . Difficulty of Paying Living Expenses:   Food Insecurity:   . Worried About Charity fundraiser in the Last Year:   . Arboriculturist in  the Last Year:   Transportation Needs:   . Film/video editor (Medical):   [name] Lack of Transportation (Non-Medical):   Physical Activity:   . Days of Exercise per Week:   . Minutes of Exercise per Session:   Stress:   . Feeling of Stress :   Social Connections:   . Frequency of Communication with Friends and Family:   . Frequency of Social Gatherings with Friends and Family:   . Attends Religious Services:   . Active Member of Clubs or Organizations:   . Attends Archivist Meetings:   [name] Marital Status:      Family History:  The patient's family history includes Hyperlipidemia in his father; Hypertension in his father.   ROS:   Please see the history of present illness.    ROS All other systems reviewed and are negative.   PHYSICAL EXAM:   VS:  BP (!) 128/59   Pulse 72   Ht 5\' 11"  (1.803 m)   Wt 260 lb 12.8 oz (118.3 kg)   SpO2 96%   BMI 36.37 kg/m      General: Alert, oriented x3, no distress, defibrillator site looks healthy.  Severely obese. Head: no evidence of trauma, strabismus, otherwise PERRL, EOMI, no exophtalmos or lid lag, no myxedema, no xanthelasma; normal ears, nose and oropharynx Neck: normal jugular venous pulsations and no hepatojugular reflux; brisk carotid pulses without delay and no carotid bruits Chest: clear to auscultation, no signs of consolidation by percussion or palpation, normal fremitus, symmetrical and full respiratory excursions Cardiovascular: normal position and quality of the apical impulse, regular rhythm, normal first and paradoxically split second heart sounds,  no murmurs, rubs or gallops he has a right antecubital AV fistula with good thrill and bruit but rather small in size. Abdomen: no tenderness or distention, no masses by palpation, no abnormal pulsatility or arterial bruits, normal bowel sounds, no hepatosplenomegaly Extremities: no clubbing, cyanosis or edema; 2+ radial, ulnar and brachial pulses bilaterally; 2+ right femoral, posterior tibial and dorsalis pedis pulses; 2+ left femoral, posterior tibial and dorsalis pedis pulses; no subclavian or femoral bruits; prominent bilateral varicose veins without ulcerations Neurological: grossly nonfocal Psych: Normal mood and affect   Wt Readings from Last 3 Encounters:  01/21/20 260 lb 12.8 oz (118.3 kg)  11/07/19 264 lb 6.4 oz (119.9 kg)  10/14/19 265 lb (120.2 kg)      Studies/Labs Reviewed:   Echo 10/07/2019 1. Left ventricular ejection fraction, by estimation, is 55 to 60%. The  left ventricle has normal function. The left ventricle has no regional  wall motion abnormalities. There is moderate left ventricular hypertrophy.  Left ventricular diastolic  parameters are consistent with Grade I diastolic dysfunction (impaired  relaxation). Elevated left ventricular end-diastolic pressure.  2. Right ventricular systolic function is normal. The right ventricular  size is normal. A AICD wire is visualized. There is normal pulmonary  artery systolic pressure.  3. The mitral valve has been repaired/replaced. Mild mitral valve  regurgitation. The mean mitral valve gradient is 4.0 mmHg with average  heart rate of 70 bpm. There is a bioprosthetic valve present in the mitral  position. Echo findings are consistent  with normal structure and function of the mitral valve prosthesis.  4. The aortic valve is tricuspid. Aortic valve regurgitation is mild to  moderate. Mild aortic valve stenosis. Aortic valve area, by VTI measures  2.14 cm. Aortic valve mean gradient measures 11.8 mmHg. Aortic valve  Vmax  measures 2.27 m/s.  5. Aortic dilatation noted. There is mild dilatation of the aortic root  measuring 41 mm.  6. The inferior vena cava is normal in size with greater than 50%  respiratory variability, suggesting right atrial pressure of 3 mmHg.   Myoview 10/10/2019  Nuclear stress EF: 49%.  The left ventricular ejection fraction is mildly decreased (45-54%).  Defect 1: There is a small defect of mild severity present in the basal anterolateral, mid anterolateral and apical lateral location.  This is an intermediate risk study.  1. Reversible basal to apical anterolateral perfusion defect consistent with ischemia. 2. Septal hypokinesis, which may be due to paced rhythm  EKG:  EKG is ordered today.  It shows atrial sensed, biventricular paced rhythm with a distinct positive initial R wave in lead V1, although the QRS is fairly broad at 178 ms.  QTc 567 ms (on amiodarone). Recent Labs: 09/24/2019: ALT 22; BNP 92.2 10/14/2019: BUN 47; Creatinine, Ser 3.31; Potassium 3.7; Sodium 138; TSH 3.150  07/25/2019 Glucose 149, BUN 59, creatinine 3.43, potassium 3.9, hemoglobin 15.1 platelets 220 6K 03/12/2019  hemoglobin A1c 6.6%  Lipid Panel    Component Value Date/Time   CHOL 108 (L) 03/10/2016 0925   TRIG 228 (H) 03/10/2016 0925   HDL 28 (L) 03/10/2016 0925   CHOLHDL 3.9 03/10/2016 0925   VLDL 46 (H) 03/10/2016 0925   LDLCALC 34 03/10/2016 0925    12/30/2018 Total cholesterol 143, triglycerides 252, HDL 35 LDL 67   ASSESSMENT:    1. Chronic combined systolic and diastolic heart failure ([place])   2. Chronic obstructive pulmonary disease, unspecified COPD type ([place])   3. Encounter for monitoring amiodarone therapy   4. Coronary artery disease of native artery of native heart with stable angina pectoris ([place])   5. History of mitral valve replacement with bioprosthetic valve   6. Aortic valve stenosis, nonrheumatic   7. Biventricular ICD (implantable  cardioverter-defibrillator) - [facility]   8. Chronic kidney disease (CKD), stage V ([place])   9. Essential hypertension   10. Diabetes mellitus type 2 in obese ([place])   11. Mixed hyperlipidemia   12. 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 CHF: NYHA functional class II-3 with shortness of breath primarily related to lung disease.  Avoid excessive diuretics due to his advanced chronic kidney disease.  For the same reason he is not receiving RAAS inhibitors.  He is on metoprolol succinate.  Left ventricular systolic function largely recovered following biventricular pacing.  Most recent EF 55-60% by nuclear stress test in April 2021 6. COPD: In the past dyspnea was primarily due to lung disease, as demonstrated by right and left heart catheterization. the PFT pattern and the response to bronchodilator suggests obstructive lung disease is the bigger problem.  He really needs to quit smoking. 7. Amiodarone: I had asked him to stop this medication about a year ago.  He has not had any atrial or ventricular arrhythmia in the 5 years that have been seeing him.  We will discontinue it. 8. CAD: Small area of lateral wall ischemia, good response to enhanced antianginal therapy with adding long-acting nitrates.  The risk of worsening renal function is high and there is no justification in performing coronary angiography at this time.  He is on beta-blockers, aspirin and clopidogrel and an effective statin 9. S/p MVR: Normal bioprosthetic valve function by recent echo.  Mean gradient 4 mmHg at  70 bpm.  Reminded him of the need for endocarditis prophylaxis. 10. AS: Very mild aortic stenosis by the most recent echocardiogram with a mean gradient of only 12 mmHg 11. CRT-D: Not pacemaker dependent.  Normal device function.  Continue remote downloads every 3 months. 12. CKD stage 5: Stable creatinine around 3.4, estimated GFR  approximately 15.  Has a mature fistula that has not yet been used.  Nephrologist is Dr. [name].   13. HTN: Well-controlled 14. DM: Has been well controlled  (Likely due to worsening renal function).  I do not have a recent hemoglobin A1c but the most recent value was 5.8%.  Not on any medications for diabetes. 15. HLP: Labs followed by PCP.  Excellent LDL level on statin, other lipid parameters consistent with insulin resistance  16. AFib: Amiodarone was stopped a year ago although is not clear to me whether he is still taking it or not.  He has not had atrial fibrillation since the immediate postoperative period, over 5 years ago.  Stop the amiodarone if he still taking it.  Atrial fibrillation would be deleterious in the setting of a mitral bioprosthesis and trigger heart failure exacerbation as well as inappropriate defibrillator shocks.   17. Peripheral venous insufficiency: He has minimal edema.  Prominent bilateral varicose veins.    Medication Adjustments/Labs and Tests Ordered: Current medicines are reviewed at length with the patient today.  Concerns regarding medicines are outlined above.  Medication changes, Labs and Tests ordered today are listed in the Patient Instructions below. Patient Instructions  Medication Instructions:  No changes *If you need a refill on your cardiac medications before your next appointment, please call your pharmacy*   Lab Work: None ordered If you have labs (blood work) drawn today and your tests are completely normal, you will receive your results only by: [name] MyChart Message (if you have MyChart) OR . A paper copy in the mail If you have any lab test that is abnormal or we need to change your treatment, we will call you to review the results.   Testing/Procedures: None ordered   Follow-Up: At [facility], you and your health needs are our priority.  As part of our continuing mission to provide you with exceptional heart care, we have created  designated Provider Care Teams.  These Care Teams include your primary Cardiologist (physician) and Advanced Practice Providers (APPs -  Physician Assistants and Nurse Practitioners) who all work together to provide you with the care you need, when you need it.  We recommend signing up for the patient portal called "MyChart".  Sign up information is provided on this After Visit Summary.  MyChart is used to connect with patients for Virtual Visits (Telemedicine).  Patients are able to view lab/test results, encounter notes, upcoming appointments, etc.  Non-urgent messages can be sent to your provider as well.   To learn more about what you can do with MyChart, go to [email or web address].    Your next appointment:   6 month(s)  The format for your next appointment:   In Person  Provider:   [name], MD      Signed, Dominic Klein, MD  01/21/2020 6:00 PM    [address], [place], [place]  [address] Phone: [phone]; Fax: [phone]

## 2020-01-22 ENCOUNTER — Telehealth: Payer: Self-pay | Admitting: *Deleted

## 2020-01-22 NOTE — Telephone Encounter (Signed)
The patient has been made aware that he should discontinue the Amiodarone, per Dr. Sallyanne Kuster. He has verbalized his understanding.

## 2020-02-09 ENCOUNTER — Ambulatory Visit (INDEPENDENT_AMBULATORY_CARE_PROVIDER_SITE_OTHER): Payer: Medicare HMO | Admitting: *Deleted

## 2020-02-09 DIAGNOSIS — I5042 Chronic combined systolic (congestive) and diastolic (congestive) heart failure: Secondary | ICD-10-CM

## 2020-02-09 DIAGNOSIS — I255 Ischemic cardiomyopathy: Secondary | ICD-10-CM

## 2020-02-11 LAB — CUP PACEART REMOTE DEVICE CHECK
Battery Remaining Longevity: 54 mo
Battery Remaining Percentage: 72 %
Brady Statistic RA Percent Paced: 0 %
Brady Statistic RV Percent Paced: 100 %
Date Time Interrogation Session: 20210830051200
HighPow Impedance: 84 Ohm
Implantable Pulse Generator Implant Date: 20170119
Lead Channel Impedance Value: 384 Ohm
Lead Channel Impedance Value: 556 Ohm
Lead Channel Impedance Value: 632 Ohm
Lead Channel Pacing Threshold Amplitude: 0.5 V
Lead Channel Pacing Threshold Amplitude: 0.9 V
Lead Channel Pacing Threshold Amplitude: 1.1 V
Lead Channel Pacing Threshold Pulse Width: 0.4 ms
Lead Channel Pacing Threshold Pulse Width: 0.4 ms
Lead Channel Pacing Threshold Pulse Width: 0.7 ms
Lead Channel Setting Pacing Amplitude: 2 V
Lead Channel Setting Pacing Amplitude: 2.6 V
Lead Channel Setting Pacing Amplitude: 2.8 V
Lead Channel Setting Pacing Pulse Width: 0.4 ms
Lead Channel Setting Pacing Pulse Width: 0.7 ms
Lead Channel Setting Sensing Sensitivity: 0.6 mV
Lead Channel Setting Sensing Sensitivity: 1 mV
Pulse Gen Serial Number: 160713

## 2020-02-12 NOTE — Progress Notes (Signed)
Remote ICD transmission.   

## 2020-04-02 ENCOUNTER — Other Ambulatory Visit: Payer: Self-pay | Admitting: Cardiovascular Disease

## 2020-04-15 ENCOUNTER — Other Ambulatory Visit: Payer: Self-pay | Admitting: Cardiovascular Disease

## 2020-05-10 ENCOUNTER — Ambulatory Visit (INDEPENDENT_AMBULATORY_CARE_PROVIDER_SITE_OTHER): Payer: Medicare HMO

## 2020-05-10 DIAGNOSIS — I5043 Acute on chronic combined systolic (congestive) and diastolic (congestive) heart failure: Secondary | ICD-10-CM | POA: Diagnosis not present

## 2020-05-10 LAB — CUP PACEART REMOTE DEVICE CHECK
Battery Remaining Longevity: 54 mo
Battery Remaining Percentage: 72 %
Brady Statistic RA Percent Paced: 0 %
Brady Statistic RV Percent Paced: 99 %
Date Time Interrogation Session: 20211129022500
HighPow Impedance: 78 Ohm
Implantable Pulse Generator Implant Date: 20170119
Lead Channel Impedance Value: 369 Ohm
Lead Channel Impedance Value: 525 Ohm
Lead Channel Impedance Value: 598 Ohm
Lead Channel Pacing Threshold Amplitude: 0.5 V
Lead Channel Pacing Threshold Amplitude: 0.9 V
Lead Channel Pacing Threshold Amplitude: 1.1 V
Lead Channel Pacing Threshold Pulse Width: 0.4 ms
Lead Channel Pacing Threshold Pulse Width: 0.4 ms
Lead Channel Pacing Threshold Pulse Width: 0.7 ms
Lead Channel Setting Pacing Amplitude: 2 V
Lead Channel Setting Pacing Amplitude: 2.6 V
Lead Channel Setting Pacing Amplitude: 2.8 V
Lead Channel Setting Pacing Pulse Width: 0.4 ms
Lead Channel Setting Pacing Pulse Width: 0.7 ms
Lead Channel Setting Sensing Sensitivity: 0.6 mV
Lead Channel Setting Sensing Sensitivity: 1 mV
Pulse Gen Serial Number: 160713

## 2020-05-14 NOTE — Progress Notes (Signed)
Remote ICD transmission.   

## 2020-08-09 ENCOUNTER — Ambulatory Visit (INDEPENDENT_AMBULATORY_CARE_PROVIDER_SITE_OTHER): Payer: Medicare HMO

## 2020-08-09 DIAGNOSIS — I5043 Acute on chronic combined systolic (congestive) and diastolic (congestive) heart failure: Secondary | ICD-10-CM | POA: Diagnosis not present

## 2020-08-09 LAB — CUP PACEART REMOTE DEVICE CHECK
Battery Remaining Longevity: 42 mo
Battery Remaining Percentage: 62 %
Brady Statistic RA Percent Paced: 0 %
Brady Statistic RV Percent Paced: 99 %
Date Time Interrogation Session: 20220228002700
HighPow Impedance: 72 Ohm
Implantable Pulse Generator Implant Date: 20170119
Lead Channel Impedance Value: 366 Ohm
Lead Channel Impedance Value: 516 Ohm
Lead Channel Impedance Value: 578 Ohm
Lead Channel Pacing Threshold Amplitude: 0.5 V
Lead Channel Pacing Threshold Amplitude: 0.9 V
Lead Channel Pacing Threshold Amplitude: 1.1 V
Lead Channel Pacing Threshold Pulse Width: 0.4 ms
Lead Channel Pacing Threshold Pulse Width: 0.4 ms
Lead Channel Pacing Threshold Pulse Width: 0.7 ms
Lead Channel Setting Pacing Amplitude: 2 V
Lead Channel Setting Pacing Amplitude: 2.6 V
Lead Channel Setting Pacing Amplitude: 2.8 V
Lead Channel Setting Pacing Pulse Width: 0.4 ms
Lead Channel Setting Pacing Pulse Width: 0.7 ms
Lead Channel Setting Sensing Sensitivity: 0.6 mV
Lead Channel Setting Sensing Sensitivity: 1 mV
Pulse Gen Serial Number: 160713

## 2020-08-11 ENCOUNTER — Other Ambulatory Visit: Payer: Self-pay | Admitting: Physician Assistant

## 2020-08-16 NOTE — Progress Notes (Signed)
Remote ICD transmission.   

## 2020-08-26 ENCOUNTER — Other Ambulatory Visit: Payer: Self-pay | Admitting: Cardiovascular Disease

## 2020-09-13 ENCOUNTER — Encounter: Payer: Self-pay | Admitting: Cardiovascular Disease

## 2020-09-13 ENCOUNTER — Other Ambulatory Visit: Payer: Self-pay

## 2020-09-13 ENCOUNTER — Ambulatory Visit (INDEPENDENT_AMBULATORY_CARE_PROVIDER_SITE_OTHER): Payer: Medicare HMO | Admitting: Cardiovascular Disease

## 2020-09-13 VITALS — BP 126/61 | HR 77 | Ht 71.5 in | Wt 259.6 lb

## 2020-09-13 DIAGNOSIS — J449 Chronic obstructive pulmonary disease, unspecified: Secondary | ICD-10-CM

## 2020-09-13 DIAGNOSIS — I350 Nonrheumatic aortic (valve) stenosis: Secondary | ICD-10-CM

## 2020-09-13 DIAGNOSIS — I10 Essential (primary) hypertension: Secondary | ICD-10-CM | POA: Diagnosis not present

## 2020-09-13 DIAGNOSIS — I4891 Unspecified atrial fibrillation: Secondary | ICD-10-CM

## 2020-09-13 DIAGNOSIS — Z95810 Presence of automatic (implantable) cardiac defibrillator: Secondary | ICD-10-CM

## 2020-09-13 DIAGNOSIS — I9789 Other postprocedural complications and disorders of the circulatory system, not elsewhere classified: Secondary | ICD-10-CM | POA: Diagnosis not present

## 2020-09-13 DIAGNOSIS — I5032 Chronic diastolic (congestive) heart failure: Secondary | ICD-10-CM | POA: Diagnosis not present

## 2020-09-13 DIAGNOSIS — E1169 Type 2 diabetes mellitus with other specified complication: Secondary | ICD-10-CM

## 2020-09-13 DIAGNOSIS — Z953 Presence of xenogenic heart valve: Secondary | ICD-10-CM

## 2020-09-13 DIAGNOSIS — I872 Venous insufficiency (chronic) (peripheral): Secondary | ICD-10-CM

## 2020-09-13 DIAGNOSIS — E669 Obesity, unspecified: Secondary | ICD-10-CM

## 2020-09-13 DIAGNOSIS — I25118 Atherosclerotic heart disease of native coronary artery with other forms of angina pectoris: Secondary | ICD-10-CM

## 2020-09-13 DIAGNOSIS — E782 Mixed hyperlipidemia: Secondary | ICD-10-CM

## 2020-09-13 DIAGNOSIS — N184 Chronic kidney disease, stage 4 (severe): Secondary | ICD-10-CM

## 2020-09-13 LAB — PACEMAKER DEVICE OBSERVATION

## 2020-09-13 NOTE — Progress Notes (Signed)
`   Cardiology Office Note    Date:  09/13/2020   ID:  Dominic [name], DOB 1951-05-15, MRN [id number]  PCP:  Frederic Jericho Estevan Ryder., MD  Cardiologist:   [name], MD   Chief Complaint  Patient presents with  . Congestive Heart Failure    History of Present Illness:  Dominic [name] is a 70 y.o. 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  He continues to have NYHA functional class II-3 exertional dyspnea, but denies orthopnea(habitually sleeps on 3 pillows, but this has not changed), PND or severe lower extremity edema.  Denies palpitations, dizziness, syncope or defibrillator discharges.  Has not had any falls or bleeding problems.  Weight has been very stable around 260 pounds.  Creatinine a year ago was up to 3.3, but repeat labs performed on July 14, 2020 showed a creatinine of 2.66.  Potassium was normal.  Continues to smoke about a pack of cigarettes a day.  He has not had angina pectoris since the addition of isosorbide mononitrate.  Nuclear stress test in April 2021 showed a small area of lateral wall ischemia.  Noninvasive/conservative approach recommended due to his renal failure.  His last coronary angiogram was performed in 2016 when he received 2 stents.  Comprehensive device check performed today. He has a dual chamber CRT-D [employer] X4 device, implanted in 2017.  LV pacing vector is LV ring 3 to can.  Estimated generator longevity is 3.5 years.  He does not have atrial pacing and has 100% biventricular pacing.  Underlying rhythm is normal sinus rhythm with 1: 1 AV conduction, he is not pacemaker dependent.  All lead parameters were manually  checked today and are within normal and stable range.  There has been no atrial fibrillation and no high ventricular rates.  He has a right arm AV fistula that is ready for hemodialysis when this should become necessary.  [name] had severe ischemic cardiomyopathy with left ventricular ejection fraction estimated to be approximately 30% by echocardiogram performed in October 2016. That echo describes septal and apical akinesis with global hypokinesis. He had evidence of restrictive filling as well as moderate right ventricular systolic dysfunction and moderate aortic insufficiency. Echo in May 2017 (after PCI and CRT) shows improved LVEF 45-50% and normal function of the mitral valve prosthesis. Right heart catheterization in May 2017 showed right atrial pressure 8, mean wedge pressure 6, cardiac index 2.2 L/minute.   He has an extensive history of coronary artery disease. He tells me that he thinks he has had 9 cardiac catheterizations and 5 stents. At least on one admission he had 2 episodes of cardiac arrest. I think this was during his hospitalization for bypass surgery/valve replacement. He underwent bypass surgery in [place] in 2015 with synchronous mitral valve replacement with a biological prosthesis. He has a history of previous stent placed in the LAD artery, type of stent and date of procedure unknown. He underwent placement of a bare metal stent to the ostium of the left circumflex coronary artery in September 2015. He subsequently underwent placement of 2 drug-eluting stents at [facility] in October 2016, placed in the right coronary artery and left circumflex coronary artery respectively. At the time of that cardiac catheterization  he had 50% stenosis in the distal left main, 90% proximal LAD, 70% in-stent restenosis proximal LAD, patent LIMA to distal LAD, 80% RCA. No mention is made of his second bypass graft, which I presume is occluded. He is on combination  aspirin/clopidogrel.  At the time of his bypass surgery received a 31 mm [employer] mitral bioprosthetic valve. By echocardiogram last October 2016 the mean transvalvular gradient was 5 mmHg. The same echocardiogram described a trileaflet unrestricted aortic valve with moderate insufficiency.  His chart describes a history of postoperative atrial fibrillation. He is not on anticoagulants. He takes low-dose amiodarone.  He has a history of left bundle branch block and he received a CRT-D [employer] device on 07/01/2015. The device and the leads are MRI conditional.  He has advanced chronic kidney disease. With recent baseline creatinine around 3.0-3.5. During his hospitalization in January 2017 the peak creatinine reached 4.6 (acute diarrheal illness), reaching a similar level in August 2017 when he had nausea and vomiting.[name] He has type 2 diabetes mellitus (not requiring insulin), complicated by kidney disease and neuropathy.  He takes atorvastatin in a relatively low dose. He has long-standing treated hypertension. He is a former smoker and has a history of COPD and takes bronchodilators. He has a history of migraine headaches treated with Fioricet. He underwent sigmoid colectomy in November 2017 for colon cancer.  Past Medical History:  Diagnosis Date  . Biventricular ICD (implantable cardioverter-defibrillator) - [facility] 09/05/2015   Jul 01, 2015 [facility] Dynagen X4  . CAD (coronary artery disease) 09/05/2015   CABG 2015 PCI-BMS ostial LCX Sept 2015 04/05/2015 cath 50% distal LM, 90% prox LAD, old stent prox LAD 70% ISR, patent LIMA-LAD, 80% prox RCA PCI-DES x2 RCA and LCX   . Chronic systolic CHF (congestive heart failure) ([place]) 09/05/2015   a. [place] 10/29/15: PCW 8, CI 2.2. Overall low filling presssures. Mean PA 20; b. echo 10/30/15: EF 45-50%, not tech suff to allwo for LV diastolic fxn, mild AI, nl appearing MVR  . CKD stage 4 due to type 2 diabetes mellitus ([place]) 09/05/2015  . Diabetes  mellitus, type 2 ([place]) 09/05/2015  . Essential hypertension 09/05/2015  . History of mitral valve replacement with bioprosthetic valve 09/05/2015   31 mm Edwards 2015, Dr. Jerelene Redden  . Postoperative atrial fibrillation ([place]) 09/05/2015    Past Surgical History:  Procedure Laterality Date  . CARDIAC CATHETERIZATION N/A 10/29/2015   Procedure: Right Heart Cath;  Surgeon: Jolaine Artist, MD;  Location: [facility];  Service: Cardiovascular;  Laterality: N/A;  . CARDIAC VALVE REPLACEMENT  2015   53m Edwards bioprosthesis  . CORONARY ANGIOPLASTY    . CORONARY ARTERY BYPASS GRAFT  2015   [facility], Dr. MJerelene Redden    Current Medications: Outpatient Medications Prior to Visit  Medication Sig Dispense Refill  . albuterol (PROVENTIL HFA;VENTOLIN HFA) 108 (90 Base) MCG/ACT inhaler Inhale 2 puffs into the lungs every 6 (six) hours as needed for wheezing or shortness of breath.    .Marland Kitchenaspirin 81 MG tablet Take 81 mg by mouth daily.    .Marland Kitchenatorvastatin (LIPITOR) 10 MG tablet TAKE 1 TABLET EVERY DAY (Patient taking differently: Take 20 mg by mouth daily.) 90 tablet 2  . bisacodyl (DULCOLAX) 5 MG EC tablet Take 5 mg by mouth daily as needed for moderate constipation.    . Cholecalciferol (VITAMIN D HIGH POTENCY PO) Take 2,000 Units by mouth daily.    . clopidogrel (PLAVIX) 75 MG tablet Take 75  mg by mouth daily.    . DULoxetine (CYMBALTA) 60 MG capsule Take 60 mg by mouth daily.     . fluticasone (FLONASE) 50 MCG/ACT nasal spray Place 1 spray into both nostrils daily.    . Fluticasone-Salmeterol 55-14 MCG/ACT AEPB Inhale 1 puff into the lungs 2 (two) times daily.    [name] glipiZIDE (GLUCOTROL) 2.5 mg TABS tablet Take 2.5 mg by mouth daily before breakfast.    . glucose blood test strip 1 each by Other route as needed for other. Use as instructed    . HYDROcodone-acetaminophen (NORCO/VICODIN) 5-325 MG tablet TAKE 1 TABLET BY MOUTH EVERY 6 HOURS AS NEEDED FOR UP TO 5 DAYS    . isosorbide mononitrate (IMDUR) 30 MG 24  hr tablet TAKE 1 TABLET BY MOUTH EVERY DAY 90 tablet 2  . metolazone (ZAROXOLYN) 2.5 MG tablet TAKE 1 TABLET BY MOUTH EVERY DAY AS NEEDED FOR WEIGHT GAIN GREATER THAN 4 POUNDS 90 tablet 0  . metoprolol succinate (TOPROL-XL) 25 MG 24 hr tablet Take 50 mg by mouth daily.     . mirtazapine (REMERON) 7.5 MG tablet Take 7.5 mg by mouth at bedtime.    . nitroGLYCERIN (NITROSTAT) 0.4 MG SL tablet Place 0.4 mg under the tongue every 5 (five) minutes as needed for chest pain.    . potassium chloride SA (KLOR-CON) 20 MEQ tablet TAKE 2 TABLETS BY MOUTH THREE TIMES A DAY 180 tablet 5  . sucralfate (CARAFATE) 1 g tablet Take 1 g by mouth 3 (three) times daily.     [name] tiZANidine (ZANAFLEX) 2 MG tablet Take 1 tablet by mouth 2 (two) times daily as needed for pain.    [name] topiramate (TOPAMAX) 25 MG tablet Take 50 mg by mouth 2 (two) times daily.     [name] torsemide (DEMADEX) 20 MG tablet Take 4 tablets (80 mg total) by mouth 2 (two) times daily. 240 tablet 3  . vitamin B-12 (CYANOCOBALAMIN) 1000 MCG tablet Take 1,000 mcg by mouth daily.     No facility-administered medications prior to visit.     Allergies:   Hydrochlorothiazide, Amoxicillin-pot clavulanate, Clindamycin, Lisinopril, Meloxicam, Topiramate, and Tamsulosin   Social History   Socioeconomic History  . Marital status: Married    Spouse name: Not on file  . Number of children: Not on file  . Years of education: Not on file  . Highest education level: Not on file  Occupational History  . Not on file  Tobacco Use  . Smoking status: Current Some Day Smoker    Packs/day: 0.50    Years: 40.00    Pack years: 20.00  . Smokeless tobacco: Never Used  Substance and Sexual Activity  . Alcohol use: No    Alcohol/week: 0.0 standard drinks  . Drug use: No  . Sexual activity: Not Currently  Other Topics Concern  . Not on file  Social History Narrative  . Not on file   Social Determinants of Health   Financial Resource Strain: Not on file  Food  Insecurity: Not on file  Transportation Needs: Not on file  Physical Activity: Not on file  Stress: Not on file  Social Connections: Not on file     Family History:  The patient's family history includes Hyperlipidemia in his father; Hypertension in his father.   ROS:   Please see the history of present illness.    ROS All other systems reviewed and are negative.   PHYSICAL EXAM:   VS:  BP 126/61  Pulse 77   Ht 5' 11.5" (1.816 m)   Wt 259 lb 9.6 oz (117.8 kg)   SpO2 98%   BMI 35.70 kg/m       General: Alert, oriented x3, no distress, healthy left subclavian defibrillator site.  Severely obese. Head: no evidence of trauma, PERRL, EOMI, no exophtalmos or lid lag, no myxedema, no xanthelasma; normal ears, nose and oropharynx Neck: normal jugular venous pulsations and no hepatojugular reflux; brisk carotid pulses without delay and no carotid bruits Chest: clear to auscultation, no signs of consolidation by percussion or palpation, normal fremitus, symmetrical and full respiratory excursions Cardiovascular: normal position and quality of the apical impulse, regular rhythm, normal first and split second heart sounds, 2/6 holosystolic murmur heard best at the left lower sternal border, no diastolic murmurs, rubs or gallops Abdomen: no tenderness or distention, no masses by palpation, no abnormal pulsatility or arterial bruits, normal bowel sounds, no hepatosplenomegaly Extremities: no clubbing, cyanosis, trivial bilateral ankle edema; 2+ radial, ulnar and brachial pulses bilaterally; 2+ right femoral, posterior tibial and dorsalis pedis pulses; 2+ left femoral, posterior tibial and dorsalis pedis pulses; no subclavian or femoral bruits Neurological: grossly nonfocal Psych: Normal mood and affect    Wt Readings from Last 3 Encounters:  09/13/20 259 lb 9.6 oz (117.8 kg)  01/21/20 260 lb 12.8 oz (118.3 kg)  11/07/19 264 lb 6.4 oz (119.9 kg)      Studies/Labs Reviewed:   Echo  10/07/2019 1. Left ventricular ejection fraction, by estimation, is 55 to 60%. The  left ventricle has normal function. The left ventricle has no regional  wall motion abnormalities. There is moderate left ventricular hypertrophy.  Left ventricular diastolic  parameters are consistent with Grade I diastolic dysfunction (impaired  relaxation). Elevated left ventricular end-diastolic pressure.  2. Right ventricular systolic function is normal. The right ventricular  size is normal. A AICD wire is visualized. There is normal pulmonary  artery systolic pressure.  3. The mitral valve has been repaired/replaced. Mild mitral valve  regurgitation. The mean mitral valve gradient is 4.0 mmHg with average  heart rate of 70 bpm. There is a bioprosthetic valve present in the mitral  position. Echo findings are consistent  with normal structure and function of the mitral valve prosthesis.  4. The aortic valve is tricuspid. Aortic valve regurgitation is mild to  moderate. Mild aortic valve stenosis. Aortic valve area, by VTI measures  2.14 cm. Aortic valve mean gradient measures 11.8 mmHg. Aortic valve Vmax  measures 2.27 m/s.  5. Aortic dilatation noted. There is mild dilatation of the aortic root  measuring 41 mm.  6. The inferior vena cava is normal in size with greater than 50%  respiratory variability, suggesting right atrial pressure of 3 mmHg.   Myoview 10/10/2019  Nuclear stress EF: 49%.  The left ventricular ejection fraction is mildly decreased (45-54%).  Defect 1: There is a small defect of mild severity present in the basal anterolateral, mid anterolateral and apical lateral location.  This is an intermediate risk study.  1. Reversible basal to apical anterolateral perfusion defect consistent with ischemia. 2. Septal hypokinesis, which may be due to paced rhythm  EKG:  EKG is ordered today.  It shows atrial sensed, biventricular paced rhythm with positive R wave in lead  V1.  Recent Labs: 09/24/2019: ALT 22; BNP 92.2 10/14/2019: BUN 47; Creatinine, Ser 3.31; Potassium 3.7; Sodium 138; TSH 3.150  07/22/2020 creatinine 2.66, potassium 3.6, hemoglobin A1c 8.1%-  Lipid Panel  Component Value Date/Time   CHOL 108 (L) 03/10/2016 0925   TRIG 228 (H) 03/10/2016 0925   HDL 28 (L) 03/10/2016 0925   CHOLHDL 3.9 03/10/2016 0925   VLDL 46 (H) 03/10/2016 0925   LDLCALC 34 03/10/2016 0925    12/30/2018 Total cholesterol 143, triglycerides 252, HDL 35 LDL 67   ASSESSMENT:    1. Chronic diastolic heart failure ([place])   2. Chronic obstructive pulmonary disease, unspecified COPD type ([place])   3. Postoperative atrial fibrillation (HCC)   4. Coronary artery disease of native artery of native heart with stable angina pectoris ([place])   5. History of mitral valve replacement with bioprosthetic valve   6. Aortic valve stenosis, nonrheumatic   7. Biventricular ICD (implantable cardioverter-defibrillator) - [facility]   8. CKD (chronic kidney disease) stage 4, GFR 15-29 ml/min (HCC)   9. Essential hypertension   10. Diabetes mellitus type 2 in obese ([place])   11. 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 CHF: Appears clinically euvolemic.  Stable NYHA functional class 2-3 with shortness of breath primarily related to lung disease.  Avoid excessive diuretics due to his advanced chronic kidney disease.  He is not receiving Entresto or other RAAS inhibitors for the same reason.  For the same reason he is not receiving RAAS inhibitors.  He is on metoprolol succinate and seems to tolerate this without wheezing.  Left ventricular systolic function largely recovered following biventricular pacing.  Most recent EF 55-60% by nuclear stress test in April 2021.  Although creatinine is a little better than in the past, probably not a good candidate for SGLT2 inhibitors (GFR 20-25). 6. COPD: Strongly encourage smoking cessation  again.  In the past dyspnea was primarily due to lung disease, as demonstrated by right and left heart catheterization. the PFT pattern and the response to bronchodilator suggests obstructive lung disease is the bigger problem.  He really needs to quit smoking. 7. History of atrial fibrillation: I have not seen any evidence of this arrhythmia since have been monitoring his device.  We stopped his amiodarone a year ago and there has been no evidence of atrial arrhythmia. 8. CAD: Denies any recent problems with angina pectoris.  Small area of lateral wall ischemia, good response to enhanced antianginal therapy with adding long-acting nitrates.  The risk of worsening renal function is high and there is no justification in performing coronary angiography at this time.  Continue beta-blockers, aspirin, clopidogrel, long-acting nitrates, high-dose statin 9. S/p MVR: Normal bioprosthetic valve function by recent echo.  Mean gradient 4 mmHg at 70 bpm.  Reminded him of the need for endocarditis prophylaxis. 10. AS: Very mild aortic stenosis by the most recent echocardiogram with a mean gradient of only 12 mmHg.  11. CRT-D: Underlying rhythm is normal sinus rhythm with 1:1 AV conduction and LBBB.  Normal device function.  Continue remote downloads every 3 months. 12. CKD stage 4: Most recent creatinine shows improvement, down from 3.3 to 2.6.  GFR 20-25.  He already has a mature fistula that has not yet been used.  Nephrologist is Dr. [name].   13. HTN: Very well controlled 14. DM: Currently on very low-dose glipizide for glucose control.  Most recent hemoglobin A1c 8.1%. 15. HLP: Labs followed by PCP.  Excellent LDL level on statin, other lipid parameters consistent with insulin resistance  16. Peripheral venous insufficiency: He has minimal edema.  Prominent bilateral varicose  veins.    Medication Adjustments/Labs and Tests Ordered: Current medicines are reviewed at length with the patient today.  Concerns  regarding medicines are outlined above.  Medication changes, Labs and Tests ordered today are listed in the Patient Instructions below. Patient Instructions  Medication Instructions:  No changes *If you need a refill on your cardiac medications before your next appointment, please call your pharmacy*   Lab Work: None ordered If you have labs (blood work) drawn today and your tests are completely normal, you will receive your results only by: [name] MyChart Message (if you have MyChart) OR . A paper copy in the mail If you have any lab test that is abnormal or we need to change your treatment, we will call you to review the results.   Testing/Procedures: None ordered   Follow-Up: At [facility], you and your health needs are our priority.  As part of our continuing mission to provide you with exceptional heart care, we have created designated Provider Care Teams.  These Care Teams include your primary Cardiologist (physician) and Advanced Practice Providers (APPs -  Physician Assistants and Nurse Practitioners) who all work together to provide you with the care you need, when you need it.  We recommend signing up for the patient portal called "MyChart".  Sign up information is provided on this After Visit Summary.  MyChart is used to connect with patients for Virtual Visits (Telemedicine).  Patients are able to view lab/test results, encounter notes, upcoming appointments, etc.  Non-urgent messages can be sent to your provider as well.   To learn more about what you can do with MyChart, go to [email or web address].    Your next appointment:   12 month(s)  The format for your next appointment:   In Person  Provider:   [name], MD        Signed, [name], MD  09/13/2020 2:31 PM    [address], [place], [place]  [address] Phone: [phone]; Fax: [phone]

## 2020-09-13 NOTE — Patient Instructions (Signed)

## 2020-11-02 ENCOUNTER — Other Ambulatory Visit: Payer: Self-pay | Admitting: Cardiovascular Disease

## 2020-11-08 LAB — CUP PACEART REMOTE DEVICE CHECK
Battery Remaining Longevity: 42 mo
Battery Remaining Percentage: 57 %
Brady Statistic RA Percent Paced: 0 %
Brady Statistic RV Percent Paced: 98 %
Date Time Interrogation Session: 20220530053500
HighPow Impedance: 79 Ohm
Implantable Pulse Generator Implant Date: 20170119
Lead Channel Impedance Value: 359 Ohm
Lead Channel Impedance Value: 467 Ohm
Lead Channel Impedance Value: 570 Ohm
Lead Channel Pacing Threshold Amplitude: 0.4 V
Lead Channel Pacing Threshold Amplitude: 0.9 V
Lead Channel Pacing Threshold Amplitude: 1.1 V
Lead Channel Pacing Threshold Pulse Width: 0.4 ms
Lead Channel Pacing Threshold Pulse Width: 0.4 ms
Lead Channel Pacing Threshold Pulse Width: 0.7 ms
Lead Channel Setting Pacing Amplitude: 2 V
Lead Channel Setting Pacing Amplitude: 2.6 V
Lead Channel Setting Pacing Amplitude: 2.8 V
Lead Channel Setting Pacing Pulse Width: 0.4 ms
Lead Channel Setting Pacing Pulse Width: 0.7 ms
Lead Channel Setting Sensing Sensitivity: 0.6 mV
Lead Channel Setting Sensing Sensitivity: 1 mV
Pulse Gen Serial Number: 160713

## 2020-11-09 ENCOUNTER — Ambulatory Visit (INDEPENDENT_AMBULATORY_CARE_PROVIDER_SITE_OTHER): Payer: Medicare HMO

## 2020-11-09 DIAGNOSIS — I255 Ischemic cardiomyopathy: Secondary | ICD-10-CM | POA: Diagnosis not present

## 2020-11-26 ENCOUNTER — Other Ambulatory Visit: Payer: Self-pay | Admitting: Cardiovascular Disease

## 2020-11-30 NOTE — Progress Notes (Signed)
Remote ICD transmission.   

## 2020-12-07 ENCOUNTER — Telehealth: Payer: Self-pay | Admitting: Cardiovascular Disease

## 2020-12-07 NOTE — Telephone Encounter (Signed)
Received call from wife and patient concerned about blood pressure reading.   BP reading 114/47, advised BP is okay.    She states patient is also having increased SOB and is pale and sleepy which is abnormal for him.   No distress noted on phone. She states he has BL foot swelling but this has been there for 1.5 months.  Denies CP.  Weight today 258 lbs -yesterday reports it was 257 lbs.  Denies increase in sodium.  Taking torsemide and metolazone as prescribed.  Saw nephrology last week, everything was "ok".  Also follow up with [facility] cardiology 6/20 (post hospital follow up) and reports everything was "ok" as well.   Denies any dark stool, blood in urine.  Reports he had bleeding from his mouth a month ago and was in the hospital but was unable to find source.   No bleeding since.   Wife states she has had to take him to the hospital before for these issues to get him "straightened out" and believes he needs to go to ER.   Advised numbers are okay but if she believes his color and his actions (sleeping) are acutely abnormal, also increased SOB then would recommend ER evaluation.     Wife aware and verbalized understanding.

## 2020-12-07 NOTE — Telephone Encounter (Signed)
.  Pt c/o BP issue: STAT if pt c/o blurred vision, one-sided weakness or slurred speech  1. What are your last 5 BP readings? 114/47  2. Are you having any other symptoms (ex. Dizziness, headache, blurred vision, passed out)?   3. What is your BP issue?

## 2020-12-08 ENCOUNTER — Other Ambulatory Visit: Payer: Self-pay | Admitting: Cardiovascular Disease

## 2021-02-07 ENCOUNTER — Ambulatory Visit (INDEPENDENT_AMBULATORY_CARE_PROVIDER_SITE_OTHER): Payer: Medicare HMO

## 2021-02-07 DIAGNOSIS — I255 Ischemic cardiomyopathy: Secondary | ICD-10-CM

## 2021-02-07 LAB — CUP PACEART REMOTE DEVICE CHECK
Battery Remaining Longevity: 36 mo
Battery Remaining Percentage: 52 %
Brady Statistic RA Percent Paced: 0 %
Brady Statistic RV Percent Paced: 97 %
Date Time Interrogation Session: 20220829042400
HighPow Impedance: 84 Ohm
Implantable Pulse Generator Implant Date: 20170119
Lead Channel Impedance Value: 377 Ohm
Lead Channel Impedance Value: 457 Ohm
Lead Channel Impedance Value: 592 Ohm
Lead Channel Pacing Threshold Amplitude: 0.4 V
Lead Channel Pacing Threshold Amplitude: 0.9 V
Lead Channel Pacing Threshold Amplitude: 1.1 V
Lead Channel Pacing Threshold Pulse Width: 0.4 ms
Lead Channel Pacing Threshold Pulse Width: 0.4 ms
Lead Channel Pacing Threshold Pulse Width: 0.7 ms
Lead Channel Setting Pacing Amplitude: 2 V
Lead Channel Setting Pacing Amplitude: 2.6 V
Lead Channel Setting Pacing Amplitude: 2.8 V
Lead Channel Setting Pacing Pulse Width: 0.4 ms
Lead Channel Setting Pacing Pulse Width: 0.7 ms
Lead Channel Setting Sensing Sensitivity: 0.6 mV
Lead Channel Setting Sensing Sensitivity: 1 mV
Pulse Gen Serial Number: 160713

## 2021-02-15 ENCOUNTER — Telehealth: Payer: Self-pay

## 2021-02-15 NOTE — Telephone Encounter (Signed)
Alert remote reviewed. Normal device function.   Persistent AF, ventricular rates are not well controlled.  Heart rates are 130-150 bpm, ? OAC.  Sent to triage.  Next remote 04/11/2021.  Spoke to patients wife, states patient is in the hospital admitted.

## 2021-02-17 NOTE — Progress Notes (Signed)
Remote ICD transmission.   

## 2021-02-22 ENCOUNTER — Telehealth: Payer: Self-pay | Admitting: Cardiovascular Disease

## 2021-02-22 NOTE — Telephone Encounter (Signed)
Spoke to Romeo Rabon PA patient has appointment with Dr.Croitoru this Friday 9/16 at 8:20 am.Spoke to patient's wife appointment has been scheduled with Dr.Croitoru this Friday 9/16 at 8:20 am.

## 2021-02-22 NOTE — Telephone Encounter (Signed)
New Message:       Romeo Rabon, PA's office called. They want this patient seen before 04-11-21. She s faxing the notes over today. Please let Lattie Haw see this and see if we can add this patient in for Friday(02-25-21). This patient have a device.

## 2021-02-25 ENCOUNTER — Encounter: Payer: Self-pay | Admitting: Cardiovascular Disease

## 2021-02-25 ENCOUNTER — Other Ambulatory Visit: Payer: Self-pay

## 2021-02-25 ENCOUNTER — Ambulatory Visit (INDEPENDENT_AMBULATORY_CARE_PROVIDER_SITE_OTHER): Payer: Medicare HMO | Admitting: Cardiovascular Disease

## 2021-02-25 VITALS — BP 138/66 | HR 97 | Ht 71.0 in | Wt 253.0 lb

## 2021-02-25 DIAGNOSIS — E1169 Type 2 diabetes mellitus with other specified complication: Secondary | ICD-10-CM

## 2021-02-25 DIAGNOSIS — Z95810 Presence of automatic (implantable) cardiac defibrillator: Secondary | ICD-10-CM

## 2021-02-25 DIAGNOSIS — I25118 Atherosclerotic heart disease of native coronary artery with other forms of angina pectoris: Secondary | ICD-10-CM

## 2021-02-25 DIAGNOSIS — E669 Obesity, unspecified: Secondary | ICD-10-CM

## 2021-02-25 DIAGNOSIS — I5043 Acute on chronic combined systolic (congestive) and diastolic (congestive) heart failure: Secondary | ICD-10-CM

## 2021-02-25 DIAGNOSIS — J449 Chronic obstructive pulmonary disease, unspecified: Secondary | ICD-10-CM | POA: Diagnosis not present

## 2021-02-25 DIAGNOSIS — E782 Mixed hyperlipidemia: Secondary | ICD-10-CM

## 2021-02-25 DIAGNOSIS — I480 Paroxysmal atrial fibrillation: Secondary | ICD-10-CM

## 2021-02-25 DIAGNOSIS — I10 Essential (primary) hypertension: Secondary | ICD-10-CM

## 2021-02-25 DIAGNOSIS — I350 Nonrheumatic aortic (valve) stenosis: Secondary | ICD-10-CM

## 2021-02-25 DIAGNOSIS — Z953 Presence of xenogenic heart valve: Secondary | ICD-10-CM

## 2021-02-25 DIAGNOSIS — I872 Venous insufficiency (chronic) (peripheral): Secondary | ICD-10-CM

## 2021-02-25 DIAGNOSIS — N184 Chronic kidney disease, stage 4 (severe): Secondary | ICD-10-CM

## 2021-02-25 NOTE — Patient Instructions (Signed)
Medication Instructions:  STOP the Aspirin  INCREASE the Torsemide to 100 mg twice daily until you see your nephrologist INCREASE the Potassium to 3 tablets twice a day  *If you need a refill on your cardiac medications before your next appointment, please call your pharmacy*   Lab Work: Your provider would like for you to have the following labs at your nephorology appointment: CMET, BNP, TSH  If you have labs (blood work) drawn today and your tests are completely normal, you will receive your results only by: [facility] (if you have MyChart) OR A paper copy in the mail If you have any lab test that is abnormal or we need to change your treatment, we will call you to review the results.   Testing/Procedures: Your physician has requested that you have an echocardiogram. Echocardiography is a painless test that uses sound waves to create images of your heart. It provides your doctor with information about the size and shape of your heart and how well your heart's chambers and valves are working. You may receive an ultrasound enhancing agent through an IV if needed to better visualize your heart during the echo.This procedure takes approximately one hour. There are no restrictions for this procedure. This will take place at the [address].    Follow-Up: At [facility], you and your health needs are our priority.  As part of our continuing mission to provide you with exceptional heart care, we have created designated Provider Care Teams.  These Care Teams include your primary Cardiologist (physician) and Advanced Practice Providers (APPs -  Physician Assistants and Nurse Practitioners) who all work together to provide you with the care you need, when you need it.  We recommend signing up for the patient portal called "MyChart".  Sign up information is provided on this After Visit Summary.  MyChart is used to connect with patients for Virtual Visits (Telemedicine).   Patients are able to view lab/test results, encounter notes, upcoming appointments, etc.  Non-urgent messages can be sent to your provider as well.   To learn more about what you can do with MyChart, go to [email or web address].    Your next appointment:   3 month(s)  The format for your next appointment:   In Person  Provider:   [name], MD

## 2021-02-25 NOTE — Progress Notes (Signed)
`   Cardiology Office Note    Date:  02/25/2021   ID:  Dominic [name], DOB [birth year], MRN [id number]  PCP:  Dominic Allegra, FNP  Cardiologist:   Dominic Klein, MD   Chief Complaint  Patient presents with   Congestive Heart Failure   Atrial Fibrillation    History of Present Illness:  Dominic [name] is a 70 y.o. 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  For the first time in years he required hospitalization for congestive heart failure on 02/14/2021.  On presentation he was found to be in atrial fibrillation with rapid ventricular response.  He reports beginning to feel unwell 1 or 2 days before his hospitalization, with worsening exertional dyspnea.  He was not aware of any palpitations, but on arrival to the emergency room he had atrial fibrillation with RVR that did not respond to treatment with intravenous amiodarone and metoprolol so that he was eventually cardioverted with a single shock at 200 J on the day of admission.  He was started on Eliquis.  The notes from [facility] state that he was evaluated by nephrology while in the hospital and that his renal function was stable.  He has not returned to baseline yet.  He is sleeping in a recliner due to shortness of breath and sinus congestion.  He is coughing, mostly bringing up sticky yellow sputum, occasionally frothy white stuff.  He has not had hemoptysis.  He denies any chest pain.  He has a very mild chronic swelling in his right ankle, where he had cellulitis, but no swelling on the left.  His weight is actually lower today than it was at his last appointment on 09/13/2020 when he weighed 260 pounds.  He has  not had dizziness or syncope.  He denies any muscle complaints.  Interrogation of his defibrillator shows that he had a few episodes of nonsustained ventricular tachycardia on September 3 and September 4, but electrograms are not available for review.  Further episodes of "NSVT" were seen starting on September 5 but are all actually atrial fibrillation with rapid ventricular response, .  It seems that he was in uninterrupted atrial fibrillation starting at noon on 09/05 until his cardioversion later that day.  During atrial fibrillation there was a deterioration in his biventricular pacing, but otherwise he has had excellent BiV pacing efficiency.  Presenting rhythm today is atrial sensed, biventricular paced and the heart rate histogram distribution is appropriate.  He overall burden of atrial fibrillation is only 1%.  BiV pacing is 95% instead of his usual 99%.  Labs performed during his hospitalization [place] included a BNP of 882 (baseline 128) and a creatinine of 2.65, BUN 64 (estimated GFR 25), potassium 3.5.  Hemoglobin is normal at 13.5.    He has been taking torsemide 80 mg twice daily and potassium 20 mEq, 2 tablets twice daily (even though it was prescribed 3 times a day).  Unfortunately he continues to smoke.  Continues to smoke about a pack of cigarettes a day.  Comprehensive device check performed today. He has a dual chamber CRT-D [employer] X4 device, implanted in 2017.  LV pacing vector is LV ring 3 to can.    He has a right  arm AV fistula that is ready for hemodialysis when this should become necessary.  He has not had angina pectoris since the addition of isosorbide mononitrate.  Nuclear stress test in April 2021 showed a small area of lateral wall ischemia.  Noninvasive/conservative approach recommended due to his renal failure.  His last coronary angiogram was performed in 2016 when he received 2 stents.  Dominic [name] had severe ischemic cardiomyopathy with left  ventricular ejection fraction estimated to be approximately 30% by echocardiogram performed in October 2016. That echo describes septal and apical akinesis with global hypokinesis. He had evidence of restrictive filling as well as moderate right ventricular systolic dysfunction and moderate aortic insufficiency. Echo in May 2017 (after PCI and CRT) shows improved LVEF 45-50% and normal function of the mitral valve prosthesis. Right heart catheterization in May 2017 showed right atrial pressure 8, mean wedge pressure 6, cardiac index 2.2 L/minute.   He has an extensive history of coronary artery disease. He tells me that he thinks he has had 9 cardiac catheterizations and 5 stents. At least on one admission he had 2 episodes of cardiac arrest. I think this was during his hospitalization for bypass surgery/valve replacement. He underwent bypass surgery in [place] in 2015 with synchronous mitral valve replacement with a biological prosthesis. He has a history of previous stent placed in the LAD artery, type of stent and date of procedure unknown. He underwent placement of a bare metal stent to the ostium of the left circumflex coronary artery in September 2015. He subsequently underwent placement of 2 drug-eluting stents at [facility] in October 2016, placed in the right coronary artery and left circumflex coronary artery respectively. At the time of that cardiac catheterization he had 50% stenosis in the distal left main, 90% proximal LAD, 70% in-stent restenosis proximal LAD, patent LIMA to distal LAD, 80% RCA. No mention is made of his second bypass graft, which I presume is occluded. He is on combination aspirin/clopidogrel.  At the time of his bypass surgery received a 31 mm [employer] mitral bioprosthetic valve. By echocardiogram last October 2016 the mean transvalvular gradient was 5 mmHg. The same echocardiogram described a trileaflet unrestricted aortic valve with moderate  insufficiency.  His chart describes a history of postoperative atrial fibrillation. He is not on anticoagulants. He takes low-dose amiodarone.  He has a history of left bundle branch block and he received a CRT-D [employer] device on 07/01/2015. The device and the leads are MRI conditional.  He has advanced chronic kidney disease. With recent baseline creatinine around 3.0-3.5. During his hospitalization in January 2017 the peak creatinine reached 4.6 (acute diarrheal illness), reaching a similar level in August 2017 when he had nausea and vomiting.[name] He has type 2 diabetes mellitus (not requiring insulin), complicated by kidney disease and neuropathy.  He takes atorvastatin in a relatively low dose. He has long-standing treated hypertension. He is a former smoker and has a history of COPD and takes bronchodilators. He has a history of migraine headaches treated with Fioricet. He underwent sigmoid colectomy in November 2017 for colon cancer.  Past Medical History:  Diagnosis Date   Biventricular ICD (implantable cardioverter-defibrillator) - [facility] 09/05/2015   Jul 01, 2015 [facility] Dynagen X4   CAD (coronary artery disease) 09/05/2015   CABG 2015 PCI-BMS ostial LCX Sept 2015 04/05/2015 cath 50% distal LM, 90% prox LAD, old stent prox LAD 70% ISR, patent LIMA-LAD, 80% prox RCA PCI-DES x2 RCA and LCX    Chronic systolic  CHF (congestive heart failure) ([place]) 09/05/2015   a. [place] 10/29/15: PCW 8, CI 2.2. Overall low filling presssures. Mean PA 20; b. echo 10/30/15: EF 45-50%, not tech suff to allwo for LV diastolic fxn, mild AI, nl appearing MVR   CKD stage 4 due to type 2 diabetes mellitus ([place]) 09/05/2015   Diabetes mellitus, type 2 ([place]) 09/05/2015   Essential hypertension 09/05/2015   History of mitral valve replacement with bioprosthetic valve 09/05/2015   31 mm Edwards 2015, Dr. Jerelene Redden   Postoperative atrial fibrillation ([place]) 09/05/2015    Past Surgical History:  Procedure Laterality Date   CARDIAC  CATHETERIZATION N/A 10/29/2015   Procedure: Right Heart Cath;  Surgeon: Jolaine Artist, MD;  Location: [facility];  Service: Cardiovascular;  Laterality: N/A;   CARDIAC VALVE REPLACEMENT  2015   63m Edwards bioprosthesis   CORONARY ANGIOPLASTY     CORONARY ARTERY BYPASS GRAFT  2015   [facility], Dr. MJerelene Redden    Current Medications: Outpatient Medications Prior to Visit  Medication Sig Dispense Refill   albuterol (PROVENTIL HFA;VENTOLIN HFA) 108 (90 Base) MCG/ACT inhaler Inhale 2 puffs into the lungs every 6 (six) hours as needed for wheezing or shortness of breath.     atorvastatin (LIPITOR) 10 MG tablet TAKE 1 TABLET EVERY DAY (Patient taking differently: Take 20 mg by mouth daily.) 90 tablet 2   bisacodyl (DULCOLAX) 5 MG EC tablet Take 5 mg by mouth daily as needed for moderate constipation.     Cholecalciferol (VITAMIN D HIGH POTENCY PO) Take 2,000 Units by mouth daily.     clopidogrel (PLAVIX) 75 MG tablet Take 75 mg by mouth daily.     DULoxetine (CYMBALTA) 60 MG capsule Take 60 mg by mouth daily.      fluticasone (FLONASE) 50 MCG/ACT nasal spray Place 1 spray into both nostrils daily.     Fluticasone-Salmeterol 55-14 MCG/ACT AEPB Inhale 1 puff into the lungs 2 (two) times daily.     glipiZIDE (GLUCOTROL) 2.5 mg TABS tablet Take 2.5 mg by mouth daily before breakfast.     glucose blood test strip 1 each by Other route as needed for other. Use as instructed     HYDROcodone-acetaminophen (NORCO/VICODIN) 5-325 MG tablet TAKE 1 TABLET BY MOUTH EVERY 6 HOURS AS NEEDED FOR UP TO 5 DAYS     isosorbide mononitrate (IMDUR) 30 MG 24 hr tablet TAKE 1 TABLET BY MOUTH EVERY DAY 90 tablet 2   metolazone (ZAROXOLYN) 2.5 MG tablet TAKE 1 TABLET BY MOUTH EVERY DAY AS NEEDED FOR WEIGHT GAIN GREATER THAN 4 POUNDS 90 tablet 3   metoprolol succinate (TOPROL-XL) 25 MG 24 hr tablet Take 50 mg by mouth daily.      mirtazapine (REMERON) 7.5 MG tablet Take 7.5 mg by mouth at bedtime.     nitroGLYCERIN  (NITROSTAT) 0.4 MG SL tablet Place 0.4 mg under the tongue every 5 (five) minutes as needed for chest pain.     potassium chloride SA (KLOR-CON) 20 MEQ tablet TAKE 2 TABLETS BY MOUTH THREE TIMES A DAY 180 tablet 5   sucralfate (CARAFATE) 1 g tablet Take 1 g by mouth 3 (three) times daily.      tiZANidine (ZANAFLEX) 2 MG tablet Take 1 tablet by mouth 2 (two) times daily as needed for pain.     topiramate (TOPAMAX) 25 MG tablet Take 50 mg by mouth 2 (two) times daily.      torsemide (DEMADEX) 20 MG tablet TAKE 4 TABLETS (80 MG)  BY MOUTH TWICE DAILY 240 tablet 3   vitamin B-12 (CYANOCOBALAMIN) 1000 MCG tablet Take 1,000 mcg by mouth daily.     aspirin 81 MG tablet Take 81 mg by mouth daily.     No facility-administered medications prior to visit.     Allergies:   Hydrochlorothiazide, Amoxicillin-pot clavulanate, Clindamycin, Lisinopril, Meloxicam, Topiramate, and Tamsulosin   Social History   Socioeconomic History   Marital status: Married    Spouse name: Not on file   Number of children: Not on file   Years of education: Not on file   Highest education level: Not on file  Occupational History   Not on file  Tobacco Use   Smoking status: Some Days    Packs/day: 0.50    Years: 40.00    Pack years: 20.00    Types: Cigarettes   Smokeless tobacco: Never  Substance and Sexual Activity   Alcohol use: No    Alcohol/week: 0.0 standard drinks   Drug use: No   Sexual activity: Not Currently  Other Topics Concern   Not on file  Social History Narrative   Not on file   Social Determinants of Health   Financial Resource Strain: Not on file  Food Insecurity: Not on file  Transportation Needs: Not on file  Physical Activity: Not on file  Stress: Not on file  Social Connections: Not on file     Family History:  The patient's family history includes Hyperlipidemia in his father; Hypertension in his father.   ROS:   Please see the history of present illness.    ROS All other systems  reviewed and are negative.   PHYSICAL EXAM:   VS:  BP 138/66   Pulse 97   Ht '5\' 11"'$  (1.803 m)   Wt 253 lb (114.8 kg)   SpO2 97%   BMI 35.29 kg/m       General: Alert, oriented x3, no distress, severely obese.  Healthy left subclavian defibrillator site. Head: no evidence of trauma, PERRL, EOMI, no exophtalmos or lid lag, no myxedema, no xanthelasma; normal ears, nose and oropharynx Neck: normal jugular venous pulsations and no hepatojugular reflux; brisk carotid pulses without delay and no carotid bruits Chest: Breath sounds are diminished throughout, but lungs are clear to auscultation, no signs of consolidation by percussion or palpation, normal fremitus, symmetrical and full respiratory excursions Cardiovascular: normal position and quality of the apical impulse, regular rhythm, normal first and second heart sounds, early peaking 1-2/6 aortic ejection murmur at right upper sternal border, no diastolic murmurs, rubs or gallops Abdomen: no tenderness or distention, no masses by palpation, no abnormal pulsatility or arterial bruits, normal bowel sounds, no hepatosplenomegaly Extremities: no clubbing, cyanosis or edema except for trivial swelling behind the medial malleolus of the right ankle; 2+ radial, ulnar and brachial pulses bilaterally; 2+ right femoral, posterior tibial and dorsalis pedis pulses; 2+ left femoral, posterior tibial and dorsalis pedis pulses; no subclavian or femoral bruits.  Good thrill and bruit over his AV fistula. Neurological: grossly nonfocal Psych: Normal mood and affect   Wt Readings from Last 3 Encounters:  02/25/21 253 lb (114.8 kg)  09/13/20 259 lb 9.6 oz (117.8 kg)  01/21/20 260 lb 12.8 oz (118.3 kg)      Studies/Labs Reviewed:   Echo 10/07/2019  1. Left ventricular ejection fraction, by estimation, is 55 to 60%. The  left ventricle has normal function. The left ventricle has no regional  wall motion abnormalities. There is moderate left ventricular  hypertrophy.  Left ventricular diastolic  parameters are consistent with Grade I diastolic dysfunction (impaired  relaxation). Elevated left ventricular end-diastolic pressure.   2. Right ventricular systolic function is normal. The right ventricular  size is normal. A AICD wire is visualized. There is normal pulmonary  artery systolic pressure.   3. The mitral valve has been repaired/replaced. Mild mitral valve  regurgitation. The mean mitral valve gradient is 4.0 mmHg with average  heart rate of 70 bpm. There is a bioprosthetic valve present in the mitral  position. Echo findings are consistent  with normal structure and function of the mitral valve prosthesis.   4. The aortic valve is tricuspid. Aortic valve regurgitation is mild to  moderate. Mild aortic valve stenosis. Aortic valve area, by VTI measures  2.14 cm. Aortic valve mean gradient measures 11.8 mmHg. Aortic valve Vmax  measures 2.27 m/s.   5. Aortic dilatation noted. There is mild dilatation of the aortic root  measuring 41 mm.   6. The inferior vena cava is normal in size with greater than 50%  respiratory variability, suggesting right atrial pressure of 3 mmHg.    Myoview 10/10/2019 Nuclear stress EF: 49%. The left ventricular ejection fraction is mildly decreased (45-54%). Defect 1: There is a small defect of mild severity present in the basal anterolateral, mid anterolateral and apical lateral location. This is an intermediate risk study.   1. Reversible basal to apical anterolateral perfusion defect consistent with ischemia. 2. Septal hypokinesis, which may be due to paced rhythm  EKG:  EKG is ordered today.  It shows atrial sensed, biventricular paced rhythm with positive R wave in lead V1.  Recent Labs: No results found for requested labs within last 8760 hours.  07/22/2020 creatinine 2.66, potassium 3.6, hemoglobin A1c 8.1%-  Lipid Panel    Component Value Date/Time   CHOL 108 (L) 03/10/2016 0925   TRIG  228 (H) 03/10/2016 0925   HDL 28 (L) 03/10/2016 0925   CHOLHDL 3.9 03/10/2016 0925   VLDL 46 (H) 03/10/2016 0925   LDLCALC 34 03/10/2016 0925    12/30/2018 Total cholesterol 143, triglycerides 252, HDL 35 LDL 67  Labs from [facility] 02/14/2021 BNP 887, hemoglobin 13.5, potassium 3.5, BUN 64, creatinine 2.65, magnesium 2.5   ASSESSMENT:    1. Acute on chronic combined systolic and diastolic congestive heart failure ([place])   2. Chronic obstructive pulmonary disease, unspecified COPD type ([place])   3. Paroxysmal atrial fibrillation ([place])   4. Coronary artery disease of native artery of native heart with stable angina pectoris ([place])   5. History of mitral valve replacement with bioprosthetic valve   6. Aortic valve stenosis, nonrheumatic   7. Biventricular ICD (implantable cardioverter-defibrillator) - [facility]   8. CKD (chronic kidney disease) stage 4, GFR 15-29 ml/min ([facility])   9. Essential hypertension   10. Diabetes mellitus type 2 in obese ([place])   11. Mixed hyperlipidemia   12. Peripheral venous insufficiency   13. Severe obesity (BMI 35.0-39.9) with comorbidity ([place])      PLAN:  In order of problems listed above:  CHF: On clinical exam he appears to be euvolemic, but he still symptomatic.  He weighs less than he did earlier this year and we need to reestablish his dry weight.  Its not clear whether heart failure exacerbation was triggered by atrial fibrillation with RVR or vice versa.  He began feeling unwell a couple of days before the atrial fibrillation occurred and he has not returned to baseline despite  maintenance of normal rhythm for the last 10 days.  BNP was markedly elevated.  Still orthopneic.  We will increase his torsemide to 100 mg twice daily and increase the potassium chloride to a total of 6 x 20 mEq tablets daily.  Recheck echocardiogram.  Unable to take RAAS inhibitors due to renal dysfunction.  Left ventricular systolic function largely recovered following biventricular  pacing.  Most recent EF 55-60% by nuclear stress test in April 2021.  Borderline indication for SGLT2 inhibitors.  [place] is only indicated for GFR more than 30, but we could use Jardiance down to GFR of 20.  Recheck BMP and renal function in about 10 days.  He has a follow-up appointment with his nephrologist later this month. COPD: Unfortunately he continues to smoke.  He will always have residual dyspnea due to emphysema..  In the past dyspnea was primarily due to lung disease, as demonstrated by right and left heart catheterization. the PFT pattern and the response to bronchodilator suggests obstructive lung disease is the bigger problem.  He really needs to quit smoking. Paroxysmal atrial fibrillation: This is the first time he has had atrial fibrillation since device implantation in 2017 and the first breakthrough episode of atrial fibrillation since we stopped amiodarone in 2020.  Unfortunately was associated with severe decompensation and hospitalization.  We will resume amiodarone, monitor liver and thyroid function tests at least every 6 months, yearly eye exam, it will be difficult to distinguish amiodarone lung side effects from his other chronic conditions.  He is appropriately anticoagulated with Eliquis.  We will stop aspirin due to the increased risk of bleeding.  If antiplatelet therapy is necessary, prefer the combination of clopidogrel and Eliquis. CAD: He does not have angina pectoris and had a low risk nuclear stress test a year ago.  Small area of lateral wall ischemia, good response to enhanced antianginal therapy with adding long-acting nitrates.  We are trying to avoid invasive therapy due to high risk of renal complications as well as bleeding, especially since he now requires anticoagulation.  Continue beta-blockers, aspirin, clopidogrel, long-acting nitrates, high-dose statin S/p MVR: Normal bioprosthetic valve function previous echocardiogram, but it is very likely that he had  increased gradients across the valve during atrial fibrillation rapid ventricular response.  Repeat echo.  Mean gradient 4 mmHg at 70 bpm.  Reminded him of the need for endocarditis prophylaxis. AS: Unchanged murmur.  Very mild aortic stenosis by the most recent echocardiogram with a mean gradient of only 12 mmHg.  CRT-D: Presenting rhythm today is normal sinus with biventricular pacing.  He lost BiV pacing during atrial fibrillation which I am sure contributed to heart failure exacerbation.  Normal device function.  No reprogramming performed today. CKD stage 4: Creatinine during hospitalization was at baseline at 2.65 and has been as high as 3.3 in the past.  GFR 20-25.  He already has a mature fistula that has not yet been used.  Nephrologist is Dr. [name].  He has an appointment with him on September 29.  Schedule the patient to have labs that include BNP, thyroid tests and a complete metabolic panel 2 days before his appointment with the nephrologist. HTN: Borderline high today, usually well controlled. DM: Currently on very low-dose glipizide for glucose control.  I cannot find a recent hemoglobin A1c. HLP: Labs followed by PCP.  Target LDL less than 70. Peripheral venous insufficiency/lymphedema: He has minimal edema following his episode of right shin cellulitis.  Prominent bilateral varicose veins.  This  contributes to his edema, which is not entirely due to heart failure. Severe obesity: weight loss would be beneficial, but need to reestablish his dry weight.    Medication Adjustments/Labs and Tests Ordered: Current medicines are reviewed at length with the patient today.  Concerns regarding medicines are outlined above.  Medication changes, Labs and Tests ordered today are listed in the Patient Instructions below. Patient Instructions  Medication Instructions:  STOP the Aspirin  INCREASE the Torsemide to 100 mg twice daily until you see your nephrologist INCREASE the Potassium to 3  tablets twice a day  *If you need a refill on your cardiac medications before your next appointment, please call your pharmacy*   Lab Work: Your provider would like for you to have the following labs at your nephorology appointment: CMET, BNP, TSH  If you have labs (blood work) drawn today and your tests are completely normal, you will receive your results only by: [place] (if you have MyChart) OR A paper copy in the mail If you have any lab test that is abnormal or we need to change your treatment, we will call you to review the results.   Testing/Procedures: Your physician has requested that you have an echocardiogram. Echocardiography is a painless test that uses sound waves to create images of your heart. It provides your doctor with information about the size and shape of your heart and how well your heart's chambers and valves are working. You may receive an ultrasound enhancing agent through an IV if needed to better visualize your heart during the echo.This procedure takes approximately one hour. There are no restrictions for this procedure. This will take place at the [address].    Follow-Up: At [facility], you and your health needs are our priority.  As part of our continuing mission to provide you with exceptional heart care, we have created designated Provider Care Teams.  These Care Teams include your primary Cardiologist (physician) and Advanced Practice Providers (APPs -  Physician Assistants and Nurse Practitioners) who all work together to provide you with the care you need, when you need it.  We recommend signing up for the patient portal called "MyChart".  Sign up information is provided on this After Visit Summary.  MyChart is used to connect with patients for Virtual Visits (Telemedicine).  Patients are able to view lab/test results, encounter notes, upcoming appointments, etc.  Non-urgent messages can be sent to your provider as well.   To  learn more about what you can do with MyChart, go to [email or web address].    Your next appointment:   3 month(s)  The format for your next appointment:   In Person  Provider:   [name], MD     Signed, Dominic Klein, MD  02/25/2021 9:07 AM    [place], [place], [place]  [address] Phone: [phone]; Fax: [phone]

## 2021-02-28 NOTE — Addendum Note (Signed)
Addended by: Wonda Horner on: 02/28/2021 03:48 PM   Modules accepted: Orders

## 2021-03-16 LAB — COMPREHENSIVE METABOLIC PANEL
ALT: 31 IU/L (ref 0–44)
AST: 34 IU/L (ref 0–40)
Albumin/Globulin Ratio: 1.4 (ref 1.2–2.2)
Albumin: 4.3 g/dL (ref 3.8–4.8)
Alkaline Phosphatase: 148 IU/L — ABNORMAL HIGH (ref 44–121)
BUN/Creatinine Ratio: 18 (ref 10–24)
BUN: 52 mg/dL — ABNORMAL HIGH (ref 8–27)
Bilirubin Total: 0.3 mg/dL (ref 0.0–1.2)
CO2: 24 mmol/L (ref 20–29)
Calcium: 9.8 mg/dL (ref 8.6–10.2)
Chloride: 95 mmol/L — ABNORMAL LOW (ref 96–106)
Creatinine, Ser: 2.9 mg/dL — ABNORMAL HIGH (ref 0.76–1.27)
Globulin, Total: 3.1 g/dL (ref 1.5–4.5)
Glucose: 194 mg/dL — ABNORMAL HIGH (ref 70–99)
Potassium: 4.2 mmol/L (ref 3.5–5.2)
Sodium: 138 mmol/L (ref 134–144)
Total Protein: 7.4 g/dL (ref 6.0–8.5)
eGFR: 23 mL/min/{1.73_m2} — ABNORMAL LOW (ref >59)

## 2021-03-16 LAB — TSH: TSH: 3.71 u[IU]/mL (ref 0.450–4.500)

## 2021-03-16 LAB — BRAIN NATRIURETIC PEPTIDE: BNP: 91.9 pg/mL (ref 0.0–100.0)

## 2021-03-17 ENCOUNTER — Ambulatory Visit (HOSPITAL_COMMUNITY): Payer: Medicare HMO | Attending: Cardiology

## 2021-03-17 ENCOUNTER — Other Ambulatory Visit: Payer: Self-pay

## 2021-03-17 DIAGNOSIS — I5043 Acute on chronic combined systolic (congestive) and diastolic (congestive) heart failure: Secondary | ICD-10-CM

## 2021-03-17 LAB — ECHOCARDIOGRAM COMPLETE
AR max vel: 2.59 cm2
AV Area VTI: 2.42 cm2
AV Area mean vel: 2.24 cm2
AV Mean grad: 20 mmHg
AV Peak grad: 34.1 mmHg
Ao pk vel: 2.92 m/s
Area-P 1/2: 2.5 cm2
MV VTI: 1.83 cm2
P 1/2 time: 326 msec
S' Lateral: 3.7 cm

## 2021-03-21 ENCOUNTER — Telehealth: Payer: Self-pay | Admitting: Internal Medicine

## 2021-03-21 NOTE — Telephone Encounter (Signed)
Patient made aware of results and verbalized understanding.  Appointment moved to 11/7 with Dr. Sallyanne Kuster.

## 2021-03-21 NOTE — Telephone Encounter (Signed)
Patient's wife returning call for echo results.

## 2021-04-03 IMAGING — CR DG CHEST 2V
2 series · 2 of 2 positions shown · non-contrast
Comparison: 05/19/2016, CT chest 01/07/2017

CLINICAL DATA: Shortness of breath

EXAM:
CHEST - 2 VIEW

[w chest pa]
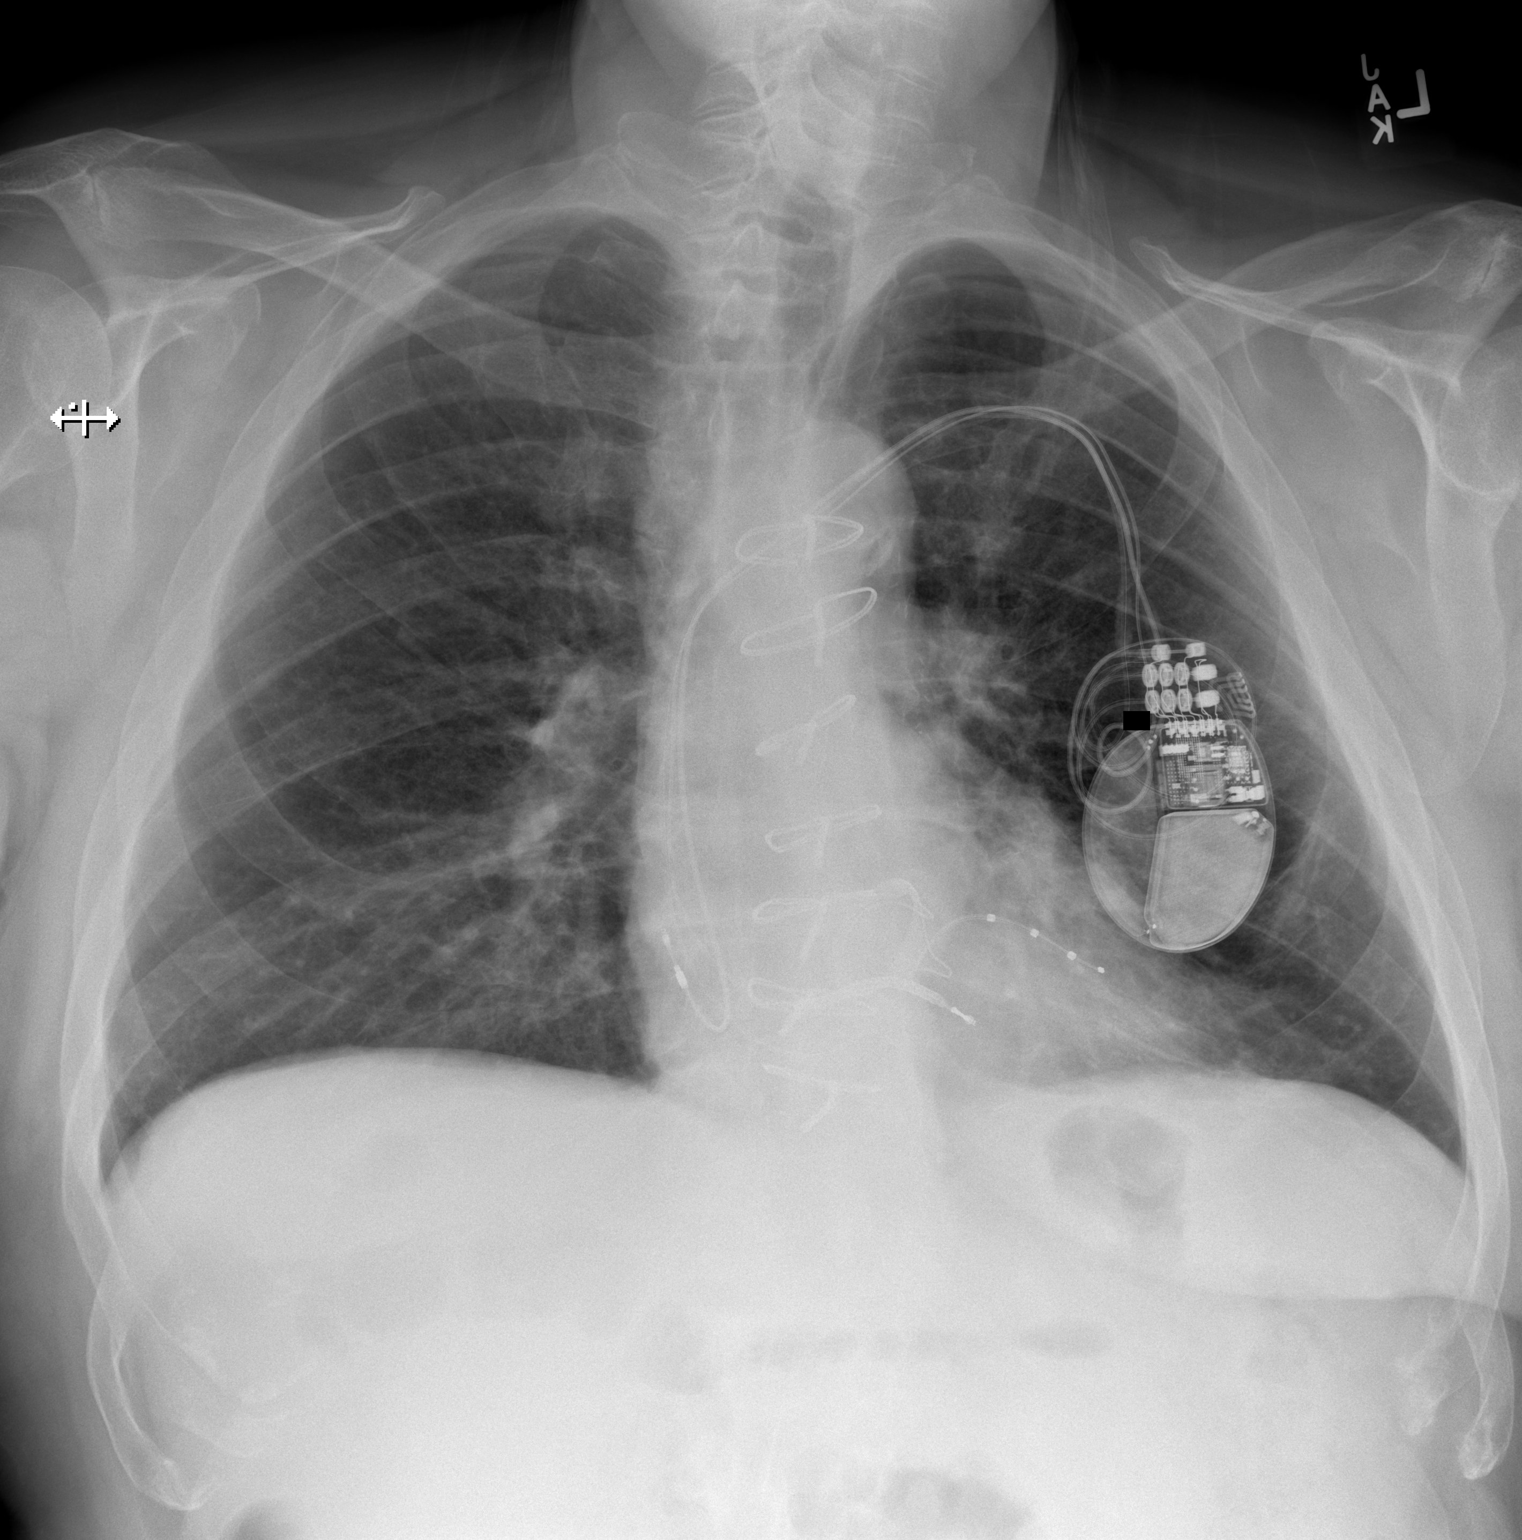

[w chest lat]
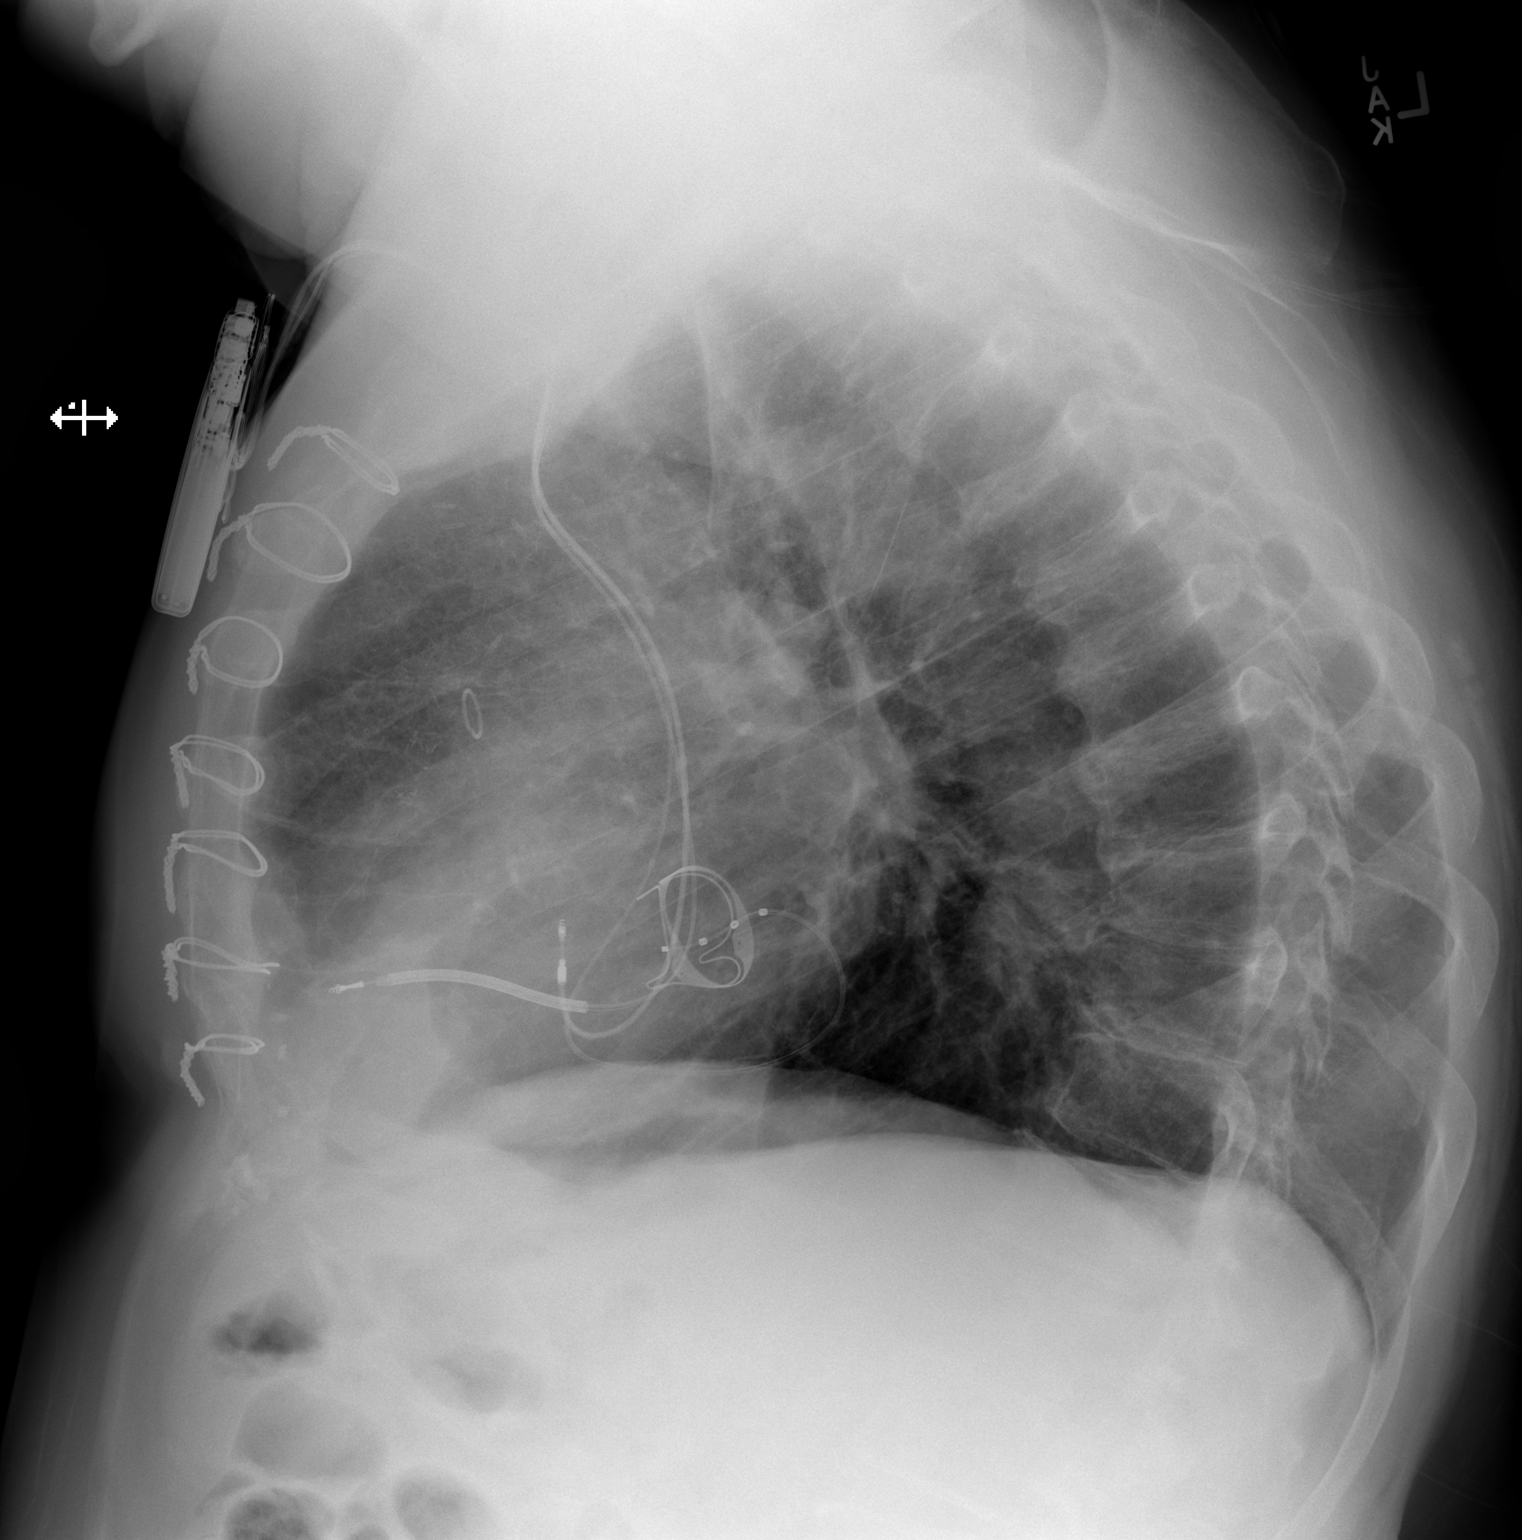

[2 of 2 positions shown; findings below may reference images not displayed]

FINDINGS: Left-sided pacing device as before. Post sternotomy changes. No
pleural effusion. Normal cardiomediastinal silhouette with aortic
atherosclerosis. Streaky left lung base opacities. Valvular
prosthesis.
IMPRESSION: Streaky left lung base opacities, possible small foci of atypical or
viral pneumonia.

These results will be called to the ordering clinician or
representative by the Radiologist Assistant, and communication
documented in the PACS or [REDACTED].

## 2021-04-11 ENCOUNTER — Encounter: Payer: Medicare HMO | Admitting: Cardiovascular Disease

## 2021-04-18 ENCOUNTER — Ambulatory Visit (INDEPENDENT_AMBULATORY_CARE_PROVIDER_SITE_OTHER): Payer: Medicare HMO | Admitting: Cardiovascular Disease

## 2021-04-18 ENCOUNTER — Other Ambulatory Visit: Payer: Self-pay | Admitting: Cardiovascular Disease

## 2021-04-18 ENCOUNTER — Other Ambulatory Visit: Payer: Self-pay

## 2021-04-18 ENCOUNTER — Encounter (HOSPITAL_COMMUNITY): Payer: Self-pay | Admitting: Internal Medicine

## 2021-04-18 ENCOUNTER — Encounter: Payer: Self-pay | Admitting: Cardiovascular Disease

## 2021-04-18 VITALS — BP 116/56 | HR 94 | Ht 71.5 in | Wt 257.0 lb

## 2021-04-18 DIAGNOSIS — I480 Paroxysmal atrial fibrillation: Secondary | ICD-10-CM

## 2021-04-18 DIAGNOSIS — Z5181 Encounter for therapeutic drug level monitoring: Secondary | ICD-10-CM

## 2021-04-18 DIAGNOSIS — E669 Obesity, unspecified: Secondary | ICD-10-CM

## 2021-04-18 DIAGNOSIS — I872 Venous insufficiency (chronic) (peripheral): Secondary | ICD-10-CM

## 2021-04-18 DIAGNOSIS — I352 Nonrheumatic aortic (valve) stenosis with insufficiency: Secondary | ICD-10-CM

## 2021-04-18 DIAGNOSIS — E782 Mixed hyperlipidemia: Secondary | ICD-10-CM

## 2021-04-18 DIAGNOSIS — J449 Chronic obstructive pulmonary disease, unspecified: Secondary | ICD-10-CM

## 2021-04-18 DIAGNOSIS — I5042 Chronic combined systolic (congestive) and diastolic (congestive) heart failure: Secondary | ICD-10-CM

## 2021-04-18 DIAGNOSIS — Z953 Presence of xenogenic heart valve: Secondary | ICD-10-CM

## 2021-04-18 DIAGNOSIS — Z7901 Long term (current) use of anticoagulants: Secondary | ICD-10-CM

## 2021-04-18 DIAGNOSIS — I2510 Atherosclerotic heart disease of native coronary artery without angina pectoris: Secondary | ICD-10-CM

## 2021-04-18 DIAGNOSIS — N184 Chronic kidney disease, stage 4 (severe): Secondary | ICD-10-CM

## 2021-04-18 DIAGNOSIS — I10 Essential (primary) hypertension: Secondary | ICD-10-CM

## 2021-04-18 DIAGNOSIS — Z79899 Other long term (current) drug therapy: Secondary | ICD-10-CM

## 2021-04-18 DIAGNOSIS — I350 Nonrheumatic aortic (valve) stenosis: Secondary | ICD-10-CM

## 2021-04-18 DIAGNOSIS — Z95810 Presence of automatic (implantable) cardiac defibrillator: Secondary | ICD-10-CM

## 2021-04-18 DIAGNOSIS — E1169 Type 2 diabetes mellitus with other specified complication: Secondary | ICD-10-CM

## 2021-04-18 NOTE — Patient Instructions (Signed)
Medication Instructions:  No changes *If you need a refill on your cardiac medications before your next appointment, please call your pharmacy*   Lab Work: None ordered If you have labs (blood work) drawn today and your tests are completely normal, you will receive your results only by: [place] (if you have MyChart) OR A paper copy in the mail If you have any lab test that is abnormal or we need to change your treatment, we will call you to review the results.   Testing/Procedures: Your physician has requested that you have a TEE. During a TEE, sound waves are used to create images of your heart. It provides your doctor with information about the size and shape of your heart and how well your heart's chambers and valves are working. In this test, a transducer is attached to the end of a flexible tube that's guided down your throat and into your esophagus (the tube leading from you mouth to your stomach) to get a more detailed image of your heart. You are not awake for the procedure. Please see the instruction sheet given to you today. For further information please visit [email or web address].   Follow-Up: At [facility], you and your health needs are our priority.  As part of our continuing mission to provide you with exceptional heart care, we have created designated Provider Care Teams.  These Care Teams include your primary Cardiologist (physician) and Advanced Practice Providers (APPs -  Physician Assistants and Nurse Practitioners) who all work together to provide you with the care you need, when you need it.  We recommend signing up for the patient portal called "MyChart".  Sign up information is provided on this After Visit Summary.  MyChart is used to connect with patients for Virtual Visits (Telemedicine).  Patients are able to view lab/test results, encounter notes, upcoming appointments, etc.  Non-urgent messages can be sent to your provider as well.   To learn more about  what you can do with MyChart, go to [email or web address].    Your next appointment:   Download in 2 weeks 3 month follow up with Dr. Sallyanne Kuster  The format for your next appointment:   In Person  Provider:   [name], MD     Other Instructions  You are scheduled for a TEE on 04/26/21 with Dr. Debara Pickett.  Please arrive at the [facility] (Main Entrance A) at [facility]: [address] [place], [place] [address] at 8 am. (1 hour prior to procedure unless lab work is needed)  DIET: Nothing to eat or drink after midnight except a sip of water with medications (see medication instructions below)  FYI: For your safety, and to allow [place] to monitor your vital signs accurately during the surgery/procedure we request that   if you have artificial nails, gel coating, SNS etc. Please have those removed prior to your surgery/procedure. Not having the nail coverings /polish removed may result in cancellation or delay of your surgery/procedure.   Medication Instructions: Hold all diabetic medication. Take half the dose of the Lantus that morning Hold the torsemide the morning of the procedure  Labs:  Your provider would like for you to return this week to have the following labs drawn: CBC and BMET. You do not need an appointment for the lab. Once in our office lobby there is a podium where you can sign in and ring the doorbell to alert [place] that you are here. The lab is open from 8:00 am to 4:30 pm;  closed for lunch from 12:45pm-1:45pm.  You may go to any [facility]   You must have a responsible person to drive you home and stay in the waiting area during your procedure. Failure to do so could result in cancellation.  Bring your insurance cards.  *Special Note: Every effort is made to have your procedure done on time. Occasionally there are emergencies that occur at the hospital that may cause delays. Please be patient if a delay does occur.

## 2021-04-18 NOTE — Progress Notes (Signed)
`   Cardiology Office Note    Date:  04/24/2021   ID:  Dominic [name], DOB 1951/01/07, MRN [id number]  PCP:  [name], FNP  Cardiologist:   [name], MD   Chief Complaint  Patient presents with   Pacemaker Check   Congestive Heart Failure   Cardiac Valve Problem    History of Present Illness:  Dominic [name] is a 70 y.o. 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  LV pacing vector is LV ring 3 to can), moderate to severe COPD, moderate to severe chronic kidney disease, type 2 diabetes mellitus complicated by nephropathy and neuropathy, history of postoperative atrial fibrillation (that has not been subsequently seen on his defibrillator checks).  For the first time in years he required hospitalization for congestive heart failure on 02/14/2021.  On presentation he was found to be in atrial fibrillation with rapid ventricular response.  He reports beginning to feel unwell 1 or 2 days before his hospitalization, with worsening exertional dyspnea.  He was not aware of any palpitations, but on arrival to the emergency room he had atrial fibrillation with RVR that did not respond to treatment with intravenous amiodarone and metoprolol so that he was eventually cardioverted with a single shock at 200 J on the day of admission.  He was started on Eliquis.  The notes from [facility] state that he was evaluated by nephrology while in the hospital and that his renal function was stable.  He continues to have NYHA functional class III dyspnea.  Always sleeps in a recliner.  Does not have lower extremity edema.  Albuterol does not really help.  We had previously estimated his "dry weight" to be around 260 pounds and he weighs 257 pounds today.  He underwent an echocardiogram 03/17/2021 that was a technically difficult study but raised concern  for possibly severe aortic insufficiency.  He had been on amiodarone in the past but this was stopped more than a year ago.  Amiodarone was restarted in September 2022 when he presented with atrial fibrillation.  He has not had any bleeding since he started the Eliquis.  He is still smoking a pack of cigarettes daily.  Interrogation of his defibrillator shows normal function.  He has about 3 years left on the current generator.  He has virtually 100% biventricular pacing efficiency (LV lead is programmed to bring 3-RV).  He has not had any atrial fibrillation since the episode on February 14, 2021.  He has not had high ventricular rate episodes since that same event.   Labs performed during his hospitalization [place] included a BNP of 882 (baseline 128) and a creatinine of 2.65, BUN 64 (estimated GFR 25), potassium 3.5.  Hemoglobin is normal at 13.5.    He has a right arm AV fistula that is ready for hemodialysis when this should become necessary.  He has not had angina pectoris since the addition of isosorbide mononitrate.  Nuclear stress test in April 2021 showed a small area of lateral wall ischemia.  Noninvasive/conservative approach recommended due to his renal failure.  His last coronary angiogram was performed in 2016 when he received 2 stents.  [name] had severe ischemic cardiomyopathy with left ventricular ejection fraction estimated to be approximately 30% by echocardiogram performed in October 2016. That echo describes septal and apical akinesis with global hypokinesis. He had evidence of restrictive  filling as well as moderate right ventricular systolic dysfunction and moderate aortic insufficiency. Echo in May 2017 (after PCI and CRT) shows improved LVEF 45-50% and normal function of the mitral valve prosthesis. Right heart catheterization in May 2017 showed right atrial pressure 8, mean wedge pressure 6, cardiac index 2.2 L/minute.   He has an extensive history of coronary  artery disease. He tells me that he thinks he has had 9 cardiac catheterizations and 5 stents. At least on one admission he had 2 episodes of cardiac arrest. I think this was during his hospitalization for bypass surgery/valve replacement. He underwent bypass surgery in [place] in 2015 with synchronous mitral valve replacement with a biological prosthesis. He has a history of previous stent placed in the LAD artery, type of stent and date of procedure unknown. He underwent placement of a bare metal stent to the ostium of the left circumflex coronary artery in September 2015. He subsequently underwent placement of 2 drug-eluting stents at [facility] in October 2016, placed in the right coronary artery and left circumflex coronary artery respectively. At the time of that cardiac catheterization he had 50% stenosis in the distal left main, 90% proximal LAD, 70% in-stent restenosis proximal LAD, patent LIMA to distal LAD, 80% RCA. No mention is made of his second bypass graft, which I presume is occluded. He is on combination aspirin/clopidogrel.  At the time of his bypass surgery received a 31 mm [employer] mitral bioprosthetic valve. By echocardiogram last October 2016 the mean transvalvular gradient was 5 mmHg. The same echocardiogram described a trileaflet unrestricted aortic valve with moderate insufficiency.  His chart describes a history of postoperative atrial fibrillation. He is not on anticoagulants. He takes low-dose amiodarone.  He has a history of left bundle branch block and he received a CRT-D [employer] device on 07/01/2015. The device and the leads are MRI conditional.  He has advanced chronic kidney disease. With recent baseline creatinine around 3.0-3.5. During his hospitalization in January 2017 the peak creatinine reached 4.6 (acute diarrheal illness), reaching a similar level in August 2017 when he had nausea and vomiting.[name] He has type 2 diabetes mellitus (not  requiring insulin), complicated by kidney disease and neuropathy.  He takes atorvastatin in a relatively low dose. He has long-standing treated hypertension. He is a former smoker and has a history of COPD and takes bronchodilators. He has a history of migraine headaches treated with Fioricet. He underwent sigmoid colectomy in November 2017 for colon cancer.  Past Medical History:  Diagnosis Date   Biventricular ICD (implantable cardioverter-defibrillator) - [facility] 09/05/2015   Jul 01, 2015 [facility] Dynagen X4   CAD (coronary artery disease) 09/05/2015   CABG 2015 PCI-BMS ostial LCX Sept 2015 04/05/2015 cath 50% distal LM, 90% prox LAD, old stent prox LAD 70% ISR, patent LIMA-LAD, 80% prox RCA PCI-DES x2 RCA and LCX    Chronic systolic CHF (congestive heart failure) ([place]) 09/05/2015   a. [place] 10/29/15: PCW 8, CI 2.2. Overall low filling presssures. Mean PA 20; b. echo 10/30/15: EF 45-50%, not tech suff to allwo for LV diastolic fxn, mild AI, nl appearing MVR   CKD stage 4 due to type 2 diabetes mellitus ([place]) 09/05/2015   Diabetes mellitus, type 2 ([place]) 09/05/2015   Essential hypertension 09/05/2015   History of mitral valve replacement with bioprosthetic valve 09/05/2015   31 mm Edwards 2015, Dr. Jerelene Redden   Postoperative atrial fibrillation ([place]) 09/05/2015    Past Surgical History:  Procedure Laterality  Date   CARDIAC CATHETERIZATION N/A 10/29/2015   Procedure: Right Heart Cath;  Surgeon: Jolaine Artist, MD;  Location: [place] [facility];  Service: Cardiovascular;  Laterality: N/A;   CARDIAC VALVE REPLACEMENT  2015   10mm Edwards bioprosthesis   CORONARY ANGIOPLASTY     CORONARY ARTERY BYPASS GRAFT  2015   [facility], Dr. Jerelene Redden.    Current Medications: Outpatient Medications Prior to Visit  Medication Sig Dispense Refill   albuterol (PROVENTIL HFA;VENTOLIN HFA) 108 (90 Base) MCG/ACT inhaler Inhale 2 puffs into the lungs every 6 (six) hours as needed for wheezing or shortness of breath.     amiodarone  (PACERONE) 200 MG tablet Take 200 mg by mouth 2 (two) times daily.     apixaban (ELIQUIS) 5 MG TABS tablet Take 5 mg by mouth 2 (two) times daily.     atorvastatin (LIPITOR) 10 MG tablet TAKE 1 TABLET EVERY DAY (Patient not taking: No sig reported) 90 tablet 2   DULoxetine (CYMBALTA) 60 MG capsule Take 60 mg by mouth daily.      fluticasone (FLONASE) 50 MCG/ACT nasal spray Place 1 spray into both nostrils daily as needed for allergies.     fluticasone-salmeterol (ADVAIR HFA) 45-21 MCG/ACT inhaler Inhale 1 puff into the lungs 2 (two) times daily.     glucose blood test strip 1 each by Other route as needed for other. Use as instructed     Insulin Pen Needle (PEN NEEDLES 3/16") 31G X 5 MM MISC Use with insulin pen daily     isosorbide mononitrate (IMDUR) 30 MG 24 hr tablet TAKE 1 TABLET BY MOUTH EVERY DAY 90 tablet 2   LANTUS SOLOSTAR 100 UNIT/ML Solostar Pen Inject 56 Units into the skin daily.     metolazone (ZAROXOLYN) 2.5 MG tablet TAKE 1 TABLET BY MOUTH EVERY DAY AS NEEDED FOR WEIGHT GAIN GREATER THAN 4 POUNDS (Patient taking differently: Take 2.5 mg by mouth 2 (two) times a week. TAKE 1 TABLET BY MOUTH EVERY DAY AS NEEDED FOR WEIGHT GAIN GREATER THAN 4 POUNDS) 90 tablet 3   mirtazapine (REMERON) 7.5 MG tablet Take 7.5 mg by mouth at bedtime.     polyethylene glycol powder (GLYCOLAX/MIRALAX) 17 GM/SCOOP powder Take 1 Container by mouth daily.     potassium chloride SA (KLOR-CON) 20 MEQ tablet TAKE 2 TABLETS BY MOUTH THREE TIMES A DAY 180 tablet 5   sucralfate (CARAFATE) 1 g tablet Take 1 g by mouth 2 (two) times daily.     TECHLITE PEN NEEDLES 31G X 5 MM MISC USE WITH INSULIN PEN DAILY     torsemide (DEMADEX) 20 MG tablet TAKE 4 TABLETS (80 MG) BY MOUTH TWICE DAILY (Patient taking differently: Take 60 mg by mouth 2 (two) times daily.) 240 tablet 3   bisacodyl (DULCOLAX) 5 MG EC tablet Take 5 mg by mouth daily as needed for moderate constipation. (Patient not taking: Reported on 04/22/2021)      Cholecalciferol (VITAMIN D HIGH POTENCY PO) Take 2,000 Units by mouth daily. (Patient not taking: Reported on 04/22/2021)     clopidogrel (PLAVIX) 75 MG tablet Take 75 mg by mouth daily. (Patient not taking: Reported on 04/22/2021)     glipiZIDE (GLUCOTROL) 2.5 mg TABS tablet Take 2.5 mg by mouth daily before breakfast. (Patient not taking: Reported on 04/22/2021)     metoprolol succinate (TOPROL-XL) 25 MG 24 hr tablet Take 50 mg by mouth daily.  (Patient not taking: Reported on 04/22/2021)     permethrin (ELIMITE)  5 % cream Apply topically. (Patient not taking: Reported on 04/22/2021)     topiramate (TOPAMAX) 25 MG tablet Take 50 mg by mouth 2 (two) times daily.  (Patient not taking: Reported on 04/22/2021)     vitamin B-12 (CYANOCOBALAMIN) 1000 MCG tablet Take 1,000 mcg by mouth daily. (Patient not taking: Reported on 04/22/2021)     HYDROcodone-acetaminophen (NORCO/VICODIN) 5-325 MG tablet Take 1 tablet by mouth every 6 (six) hours as needed for moderate pain.     nitroGLYCERIN (NITROSTAT) 0.4 MG SL tablet Place 0.4 mg under the tongue every 5 (five) minutes as needed for chest pain.     tiZANidine (ZANAFLEX) 2 MG tablet Take 2 mg by mouth 2 (two) times daily as needed for muscle spasms.     No facility-administered medications prior to visit.     Allergies:   Hydrochlorothiazide, Amoxicillin-pot clavulanate, Clindamycin, Lisinopril, Meloxicam, Topiramate, and Tamsulosin   Social History   Socioeconomic History   Marital status: Married    Spouse name: Not on file   Number of children: Not on file   Years of education: Not on file   Highest education level: Not on file  Occupational History   Not on file  Tobacco Use   Smoking status: Some Days    Packs/day: 0.50    Years: 40.00    Pack years: 20.00    Types: Cigarettes   Smokeless tobacco: Never  Substance and Sexual Activity   Alcohol use: No    Alcohol/week: 0.0 standard drinks   Drug use: No   Sexual activity: Not  Currently  Other Topics Concern   Not on file  Social History Narrative   Not on file   Social Determinants of Health   Financial Resource Strain: Not on file  Food Insecurity: Not on file  Transportation Needs: Not on file  Physical Activity: Not on file  Stress: Not on file  Social Connections: Not on file     Family History:  The patient's family history includes Hyperlipidemia in his father; Hypertension in his father.   ROS:   Please see the history of present illness.    ROS All other systems reviewed and are negative.   PHYSICAL EXAM:   VS:  BP (!) 116/56 (BP Location: Left Arm, Patient Position: Sitting, Cuff Size: Large)   Pulse 94   Ht 5' 11.5" (1.816 m)   Wt 257 lb (116.6 kg)   SpO2 93%   BMI 35.34 kg/m       General: Alert, oriented x3, no distress, severely obese.  Healthy left subclavian defibrillator site. Head: no evidence of trauma, PERRL, EOMI, no exophtalmos or lid lag, no myxedema, no xanthelasma; normal ears, nose and oropharynx Neck: normal jugular venous pulsations and no hepatojugular reflux; brisk carotid pulses without delay and no carotid bruits Chest: Breath sounds are diminished throughout, but lungs are clear to auscultation, no signs of consolidation by percussion or palpation, normal fremitus, symmetrical and full respiratory excursions Cardiovascular: normal position and quality of the apical impulse, regular rhythm, normal first and second heart sounds, early peaking 1-2/6 aortic ejection murmur at right upper sternal border, no diastolic murmurs, rubs or gallops Abdomen: no tenderness or distention, no masses by palpation, no abnormal pulsatility or arterial bruits, normal bowel sounds, no hepatosplenomegaly Extremities: no clubbing, cyanosis or edema except for trivial swelling behind the medial malleolus of the right ankle; 2+ radial, ulnar and brachial pulses bilaterally; 2+ right femoral, posterior tibial and dorsalis pedis pulses; 2+  left femoral, posterior tibial and dorsalis pedis pulses; no subclavian or femoral bruits.  Good thrill and bruit over his AV fistula. Neurological: grossly nonfocal Psych: Normal mood and affect   Wt Readings from Last 3 Encounters:  04/18/21 257 lb (116.6 kg)  02/25/21 253 lb (114.8 kg)  09/13/20 259 lb 9.6 oz (117.8 kg)      Studies/Labs Reviewed:   Echo 03/17/2021   1. Left ventricular ejection fraction, by estimation, is 55 to 60%. The  left ventricle has normal function. The left ventricle has no regional  wall motion abnormalities. There is moderate concentric left ventricular  hypertrophy of the basal-septal  segment. Left ventricular diastolic function could not be evaluated.   2. Right ventricular systolic function is mildly reduced. The right  ventricular size is normal. There is normal pulmonary artery systolic  pressure. The estimated right ventricular systolic pressure is 77.8 mmHg.   3. Left atrial size was mildly dilated.   4. The mitral valve has been repaired/replaced. Mild mitral valve  regurgitation. No evidence of mitral stenosis. The mean mitral valve  gradient is 6.0 mmHg with HR 81bpm.   5. The aortic valve is tricuspid. There is moderate calcification of the  aortic valve. There is moderate thickening of the aortic valve. Aortic  valve regurgitation is moderate by PHT but visually appears moderate to  severe. Mild aortic valve stenosis.  Aortic regurgitation PHT measures 326 msec. Aortic valve mean gradient  measures 20.0 mmHg. Aortic valve Vmax measures 2.92 m/s.   6. Aortic dilatation noted. There is mild dilatation of the ascending  aorta, measuring 41 mm.   7. The inferior vena cava is normal in size with greater than 50%  respiratory variability, suggesting right atrial pressure of 3 mmHg.   8. Compared to prior echo, the mean MVG of the MV bioprosthesis has  increased from 4 to 56mmHg but HR faster on this study. AI appears to have  progressed.  The AI is moderate by PHT but visually appears moderate to  severe. Consider TEE for further  quantification of AI.    Myoview 10/10/2019 Nuclear stress EF: 49%. The left ventricular ejection fraction is mildly decreased (45-54%). Defect 1: There is a small defect of mild severity present in the basal anterolateral, mid anterolateral and apical lateral location. This is an intermediate risk study.   1. Reversible basal to apical anterolateral perfusion defect consistent with ischemia. 2. Septal hypokinesis, which may be due to paced rhythm  EKG:  EKG is ordered today.  It shows atrial sensed, biventricular paced rhythm with positive R wave in lead V1.  Recent Labs: 03/15/2021: ALT 31; BNP 91.9; TSH 3.710 04/22/2021: BUN 75; Creatinine, Ser 3.55; Hemoglobin 15.8; Platelets 238; Potassium 3.5; Sodium 135  07/22/2020 creatinine 2.66, potassium 3.6, hemoglobin A1c 8.1%-  Lipid Panel    Component Value Date/Time   CHOL 108 (L) 03/10/2016 0925   TRIG 228 (H) 03/10/2016 0925   HDL 28 (L) 03/10/2016 0925   CHOLHDL 3.9 03/10/2016 0925   VLDL 46 (H) 03/10/2016 0925   LDLCALC 34 03/10/2016 0925    12/30/2018 Total cholesterol 143, triglycerides 252, HDL 35 LDL 67  Labs from [facility] 02/14/2021 BNP 887, hemoglobin 13.5, potassium 3.5, BUN 64, creatinine 2.65, magnesium 2.5   ASSESSMENT:    1. Chronic combined systolic and diastolic heart failure ([place])   2. Nonrheumatic aortic insufficiency with aortic stenosis   3. Chronic obstructive pulmonary disease, unspecified COPD type (HCC)   4. Paroxysmal atrial  fibrillation ([place])   5. Encounter for monitoring amiodarone therapy   6. Long term (current) use of anticoagulants   7. Coronary artery disease involving native coronary artery of native heart without angina pectoris   8. History of mitral valve replacement with bioprosthetic valve   9. Biventricular ICD (implantable cardioverter-defibrillator) - [facility]   10. CKD (chronic kidney  disease) stage 4, GFR 15-29 ml/min (HCC)   11. Essential hypertension   12. Diabetes mellitus type 2 in obese ([place])   13. Mixed hyperlipidemia   14. Peripheral venous insufficiency   15. Severe obesity (BMI 35.0-39.9) with comorbidity ([place])      PLAN:  In order of problems listed above:  CHF: His physical exam is impeded by severe obesity, but I am inclined to say that he is euvolemic.  It remains unclear whether heart failure exacerbation triggered the atrial fibrillation or vice versa.  He began feeling unwell a couple of days before the atrial fibrillation occurred and he has not returned to baseline despite maintenance of normal rhythm for the last 10 days.  BNP was markedly elevated.  Still orthopneic, even after we increased his dose of diuretic.  EF by echo remains normal.  Unable to take RAAS inhibitors due to renal dysfunction.  Borderline indication for SGLT2 inhibitors.  [place] is only indicated for GFR more than 30, but we could use Jardiance down to GFR of 20.  Recheck BMP and renal function in about 10 days.  He has a follow-up appointment with his nephrologist later this month. AS/AI: Concern for possible severe AI.  His mean systolic gradient has increased from 10 mmHg to 20 mmHg: This could be progression of the aortic stenosis but could also represent increased flow due to severe.  We will schedule for transesophageal echo since the transthoracic images are nondiagnostic.  He is not a candidate for surgical aortic valve replacement, but could be a marginal candidate for TAVR. COPD: Unfortunately he continues to smoke.  He will always have residual dyspnea due to emphysema..  In the past dyspnea was primarily due to lung disease, as demonstrated by right and left heart catheterization. the PFT pattern and the response to bronchodilator suggests obstructive lung disease is the bigger problem.  He really needs to quit smoking. Paroxysmal atrial fibrillation: [place] 2022 was the first  time that he had atrial fibrillation since device implantation in 2017 and the first breakthrough episode of atrial fibrillation since we stopped amiodarone in 2020.  Unfortunately was associated with severe decompensation and hospitalization.   Amiodarone, monitor liver and thyroid function tests at least every 6 months, yearly eye exam, it will be difficult to distinguish amiodarone lung side effects from his other chronic conditions.   Anticoagulation: On Eliquis, no bleeding so far CAD: He does not have angina pectoris and had a low risk nuclear stress test a year ago.  Small area of lateral wall ischemia, good response to enhanced antianginal therapy with adding long-acting nitrates.  We are trying to avoid invasive therapy due to high risk of renal complications as well as bleeding, especially since he now requires anticoagulation.  Continue beta-blockers, aspirin, clopidogrel, long-acting nitrates, high-dose statin S/p MVR: Normal bioprosthetic valve function previous echocardiogram, but it is very likely that he had increased gradients across the valve during atrial fibrillation rapid ventricular response.  Repeat echo shows mean gradient 6 mmHg at 85 bpm (previously 4 mm Hg at 70 bpm).  Reminded him of the need for endocarditis prophylaxis.  CRT-D: Presenting  rhythm today is normal sinus with biventricular pacing.  He lost BiV pacing during atrial fibrillation which I am sure contributed to heart failure exacerbation.  Normal device function.  No reprogramming necessary today. CKD stage 4: Creatinine during hospitalization was at baseline at 2.65 and has been as high as 3.3 in the past.  GFR 20-25.  He already has a mature fistula that has not yet been used.  Nephrologist is Dr. [name].   HTN: well controlled DM: Currently on very low-dose glipizide for glucose control.   HLP: Labs followed by PCP.  Target LDL less than 70. Peripheral venous insufficiency/lymphedema:  Prominent bilateral  varicose veins.  This contributes to his edema, which is not entirely due to heart failure. Severe obesity: weight loss would be beneficial, but need to reestablish his dry weight.  Scheduled for TEE NOV 15 with Dr. Debara Pickett. This procedure has been fully reviewed with the patient and written informed consent has been obtained.   Medication Adjustments/Labs and Tests Ordered: Current medicines are reviewed at length with the patient today.  Concerns regarding medicines are outlined above.  Medication changes, Labs and Tests ordered today are listed in the Patient Instructions below. Patient Instructions  Medication Instructions:  No changes *If you need a refill on your cardiac medications before your next appointment, please call your pharmacy*   Lab Work: None ordered If you have labs (blood work) drawn today and your tests are completely normal, you will receive your results only by: [place] (if you have MyChart) OR A paper copy in the mail If you have any lab test that is abnormal or we need to change your treatment, we will call you to review the results.   Testing/Procedures: Your physician has requested that you have a TEE. During a TEE, sound waves are used to create images of your heart. It provides your doctor with information about the size and shape of your heart and how well your heart's chambers and valves are working. In this test, a transducer is attached to the end of a flexible tube that's guided down your throat and into your esophagus (the tube leading from you mouth to your stomach) to get a more detailed image of your heart. You are not awake for the procedure. Please see the instruction sheet given to you today. For further information please visit [email or web address].   Follow-Up: At [facility], you and your health needs are our priority.  As part of our continuing mission to provide you with exceptional heart care, we have created designated Provider Care  Teams.  These Care Teams include your primary Cardiologist (physician) and Advanced Practice Providers (APPs -  Physician Assistants and Nurse Practitioners) who all work together to provide you with the care you need, when you need it.  We recommend signing up for the patient portal called "MyChart".  Sign up information is provided on this After Visit Summary.  MyChart is used to connect with patients for Virtual Visits (Telemedicine).  Patients are able to view lab/test results, encounter notes, upcoming appointments, etc.  Non-urgent messages can be sent to your provider as well.   To learn more about what you can do with MyChart, go to [email or web address].    Your next appointment:   Download in 2 weeks 3 month follow up with Dr. Sallyanne Kuster  The format for your next appointment:   In Person  Provider:   [name], MD     Other Instructions  You are scheduled for a  TEE on 04/26/21 with Dr. Debara Pickett.  Please arrive at the [facility] (Main Entrance A) at [facility]: [address] [place], [place] [address] at 8 am. (1 hour prior to procedure unless lab work is needed)  DIET: Nothing to eat or drink after midnight except a sip of water with medications (see medication instructions below)  FYI: For your safety, and to allow [place] to monitor your vital signs accurately during the surgery/procedure we request that   if you have artificial nails, gel coating, SNS etc. Please have those removed prior to your surgery/procedure. Not having the nail coverings /polish removed may result in cancellation or delay of your surgery/procedure.   Medication Instructions: Hold all diabetic medication. Take half the dose of the Lantus that morning Hold the torsemide the morning of the procedure  Labs:  Your provider would like for you to return this week to have the following labs drawn: CBC and BMET. You do not need an appointment for the lab. Once in our office lobby there is a podium  where you can sign in and ring the doorbell to alert [place] that you are here. The lab is open from 8:00 am to 4:30 pm; closed for lunch from 12:45pm-1:45pm.  You may go to any [facility]   You must have a responsible person to drive you home and stay in the waiting area during your procedure. Failure to do so could result in cancellation.  Bring your insurance cards.  *Special Note: Every effort is made to have your procedure done on time. Occasionally there are emergencies that occur at the hospital that may cause delays. Please be patient if a delay does occur.     Signed, [name], MD  04/24/2021 4:35 PM    [address], [place], [place]  [address] Phone: [phone]; Fax: [phone]

## 2021-04-18 NOTE — H&P (View-Only) (Signed)
`   Cardiology Office Note    Date:  04/24/2021   ID:  Dominic [name], DOB [birth year], MRN [id number]  PCP:  [name], FNP  Cardiologist:   [name], MD   Chief Complaint  Patient presents with   Pacemaker Check   Congestive Heart Failure   Cardiac Valve Problem    History of Present Illness:  Dominic [name] is a 70 y.o. 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  LV pacing vector is LV ring 3 to can), moderate to severe COPD, moderate to severe chronic kidney disease, type 2 diabetes mellitus complicated by nephropathy and neuropathy, history of postoperative atrial fibrillation (that has not been subsequently seen on his defibrillator checks).  For the first time in years he required hospitalization for congestive heart failure on 02/14/2021.  On presentation he was found to be in atrial fibrillation with rapid ventricular response.  He reports beginning to feel unwell 1 or 2 days before his hospitalization, with worsening exertional dyspnea.  He was not aware of any palpitations, but on arrival to the emergency room he had atrial fibrillation with RVR that did not respond to treatment with intravenous amiodarone and metoprolol so that he was eventually cardioverted with a single shock at 200 J on the day of admission.  He was started on Eliquis.  The notes from [facility] state that he was evaluated by nephrology while in the hospital and that his renal function was stable.  He continues to have NYHA functional class III dyspnea.  Always sleeps in a recliner.  Does not have lower extremity edema.  Albuterol does not really help.  We had previously estimated his "dry weight" to be around 260 pounds and he weighs 257 pounds today.  He underwent an echocardiogram 03/17/2021 that was a technically difficult study but raised concern  for possibly severe aortic insufficiency.  He had been on amiodarone in the past but this was stopped more than a year ago.  Amiodarone was restarted in September 2022 when he presented with atrial fibrillation.  He has not had any bleeding since he started the Eliquis.  He is still smoking a pack of cigarettes daily.  Interrogation of his defibrillator shows normal function.  He has about 3 years left on the current generator.  He has virtually 100% biventricular pacing efficiency (LV lead is programmed to bring 3-RV).  He has not had any atrial fibrillation since the episode on February 14, 2021.  He has not had high ventricular rate episodes since that same event.   Labs performed during his hospitalization [place] included a BNP of 882 (baseline 128) and a creatinine of 2.65, BUN 64 (estimated GFR 25), potassium 3.5.  Hemoglobin is normal at 13.5.    He has a right arm AV fistula that is ready for hemodialysis when this should become necessary.  He has not had angina pectoris since the addition of isosorbide mononitrate.  Nuclear stress test in April 2021 showed a small area of lateral wall ischemia.  Noninvasive/conservative approach recommended due to his renal failure.  His last coronary angiogram was performed in 2016 when he received 2 stents.  Dominic [name] had severe ischemic cardiomyopathy with left ventricular ejection fraction estimated to be approximately 30% by echocardiogram performed in October 2016. That echo describes septal and apical akinesis with global hypokinesis. He had evidence of restrictive  filling as well as moderate right ventricular systolic dysfunction and moderate aortic insufficiency. Echo in May 2017 (after PCI and CRT) shows improved LVEF 45-50% and normal function of the mitral valve prosthesis. Right heart catheterization in May 2017 showed right atrial pressure 8, mean wedge pressure 6, cardiac index 2.2 L/minute.   He has an extensive history of coronary  artery disease. He tells me that he thinks he has had 9 cardiac catheterizations and 5 stents. At least on one admission he had 2 episodes of cardiac arrest. I think this was during his hospitalization for bypass surgery/valve replacement. He underwent bypass surgery in [place] in 2015 with synchronous mitral valve replacement with a biological prosthesis. He has a history of previous stent placed in the LAD artery, type of stent and date of procedure unknown. He underwent placement of a bare metal stent to the ostium of the left circumflex coronary artery in September 2015. He subsequently underwent placement of 2 drug-eluting stents at [facility] in October 2016, placed in the right coronary artery and left circumflex coronary artery respectively. At the time of that cardiac catheterization he had 50% stenosis in the distal left main, 90% proximal LAD, 70% in-stent restenosis proximal LAD, patent LIMA to distal LAD, 80% RCA. No mention is made of his second bypass graft, which I presume is occluded. He is on combination aspirin/clopidogrel.  At the time of his bypass surgery received a 31 mm [employer] mitral bioprosthetic valve. By echocardiogram last October 2016 the mean transvalvular gradient was 5 mmHg. The same echocardiogram described a trileaflet unrestricted aortic valve with moderate insufficiency.  His chart describes a history of postoperative atrial fibrillation. He is not on anticoagulants. He takes low-dose amiodarone.  He has a history of left bundle branch block and he received a CRT-D [employer] device on 07/01/2015. The device and the leads are MRI conditional.  He has advanced chronic kidney disease. With recent baseline creatinine around 3.0-3.5. During his hospitalization in January 2017 the peak creatinine reached 4.6 (acute diarrheal illness), reaching a similar level in August 2017 when he had nausea and vomiting.Dominic [name] He has type 2 diabetes mellitus (not  requiring insulin), complicated by kidney disease and neuropathy.  He takes atorvastatin in a relatively low dose. He has long-standing treated hypertension. He is a former smoker and has a history of COPD and takes bronchodilators. He has a history of migraine headaches treated with Fioricet. He underwent sigmoid colectomy in November 2017 for colon cancer.  Past Medical History:  Diagnosis Date   Biventricular ICD (implantable cardioverter-defibrillator) - [facility] 09/05/2015   Jul 01, 2015 [facility] Dynagen X4   CAD (coronary artery disease) 09/05/2015   CABG 2015 PCI-BMS ostial LCX Sept 2015 04/05/2015 cath 50% distal LM, 90% prox LAD, old stent prox LAD 70% ISR, patent LIMA-LAD, 80% prox RCA PCI-DES x2 RCA and LCX    Chronic systolic CHF (congestive heart failure) ([place]) 09/05/2015   a. [place] 10/29/15: PCW 8, CI 2.2. Overall low filling presssures. Mean PA 20; b. echo 10/30/15: EF 45-50%, not tech suff to allwo for LV diastolic fxn, mild AI, nl appearing MVR   CKD stage 4 due to type 2 diabetes mellitus ([place]) 09/05/2015   Diabetes mellitus, type 2 ([place]) 09/05/2015   Essential hypertension 09/05/2015   History of mitral valve replacement with bioprosthetic valve 09/05/2015   31 mm Edwards 2015, Dr. Jerelene Redden   Postoperative atrial fibrillation ([place]) 09/05/2015    Past Surgical History:  Procedure Laterality  Date   CARDIAC CATHETERIZATION N/A 10/29/2015   Procedure: Right Heart Cath;  Surgeon: Jolaine Artist, MD;  Location: [facility];  Service: Cardiovascular;  Laterality: N/A;   CARDIAC VALVE REPLACEMENT  2015   32mm Edwards bioprosthesis   CORONARY ANGIOPLASTY     CORONARY ARTERY BYPASS GRAFT  2015   [facility], Dr. Jerelene Redden.    Current Medications: Outpatient Medications Prior to Visit  Medication Sig Dispense Refill   albuterol (PROVENTIL HFA;VENTOLIN HFA) 108 (90 Base) MCG/ACT inhaler Inhale 2 puffs into the lungs every 6 (six) hours as needed for wheezing or shortness of breath.     amiodarone  (PACERONE) 200 MG tablet Take 200 mg by mouth 2 (two) times daily.     apixaban (ELIQUIS) 5 MG TABS tablet Take 5 mg by mouth 2 (two) times daily.     atorvastatin (LIPITOR) 10 MG tablet TAKE 1 TABLET EVERY DAY (Patient not taking: No sig reported) 90 tablet 2   DULoxetine (CYMBALTA) 60 MG capsule Take 60 mg by mouth daily.      fluticasone (FLONASE) 50 MCG/ACT nasal spray Place 1 spray into both nostrils daily as needed for allergies.     fluticasone-salmeterol (ADVAIR HFA) 45-21 MCG/ACT inhaler Inhale 1 puff into the lungs 2 (two) times daily.     glucose blood test strip 1 each by Other route as needed for other. Use as instructed     Insulin Pen Needle (PEN NEEDLES 3/16") 31G X 5 MM MISC Use with insulin pen daily     isosorbide mononitrate (IMDUR) 30 MG 24 hr tablet TAKE 1 TABLET BY MOUTH EVERY DAY 90 tablet 2   LANTUS SOLOSTAR 100 UNIT/ML Solostar Pen Inject 56 Units into the skin daily.     metolazone (ZAROXOLYN) 2.5 MG tablet TAKE 1 TABLET BY MOUTH EVERY DAY AS NEEDED FOR WEIGHT GAIN GREATER THAN 4 POUNDS (Patient taking differently: Take 2.5 mg by mouth 2 (two) times a week. TAKE 1 TABLET BY MOUTH EVERY DAY AS NEEDED FOR WEIGHT GAIN GREATER THAN 4 POUNDS) 90 tablet 3   mirtazapine (REMERON) 7.5 MG tablet Take 7.5 mg by mouth at bedtime.     polyethylene glycol powder (GLYCOLAX/MIRALAX) 17 GM/SCOOP powder Take 1 Container by mouth daily.     potassium chloride SA (KLOR-CON) 20 MEQ tablet TAKE 2 TABLETS BY MOUTH THREE TIMES A DAY 180 tablet 5   sucralfate (CARAFATE) 1 g tablet Take 1 g by mouth 2 (two) times daily.     TECHLITE PEN NEEDLES 31G X 5 MM MISC USE WITH INSULIN PEN DAILY     torsemide (DEMADEX) 20 MG tablet TAKE 4 TABLETS (80 MG) BY MOUTH TWICE DAILY (Patient taking differently: Take 60 mg by mouth 2 (two) times daily.) 240 tablet 3   bisacodyl (DULCOLAX) 5 MG EC tablet Take 5 mg by mouth daily as needed for moderate constipation. (Patient not taking: Reported on 04/22/2021)      Cholecalciferol (VITAMIN D HIGH POTENCY PO) Take 2,000 Units by mouth daily. (Patient not taking: Reported on 04/22/2021)     clopidogrel (PLAVIX) 75 MG tablet Take 75 mg by mouth daily. (Patient not taking: Reported on 04/22/2021)     glipiZIDE (GLUCOTROL) 2.5 mg TABS tablet Take 2.5 mg by mouth daily before breakfast. (Patient not taking: Reported on 04/22/2021)     metoprolol succinate (TOPROL-XL) 25 MG 24 hr tablet Take 50 mg by mouth daily.  (Patient not taking: Reported on 04/22/2021)     permethrin (ELIMITE)  5 % cream Apply topically. (Patient not taking: Reported on 04/22/2021)     topiramate (TOPAMAX) 25 MG tablet Take 50 mg by mouth 2 (two) times daily.  (Patient not taking: Reported on 04/22/2021)     vitamin B-12 (CYANOCOBALAMIN) 1000 MCG tablet Take 1,000 mcg by mouth daily. (Patient not taking: Reported on 04/22/2021)     HYDROcodone-acetaminophen (NORCO/VICODIN) 5-325 MG tablet Take 1 tablet by mouth every 6 (six) hours as needed for moderate pain.     nitroGLYCERIN (NITROSTAT) 0.4 MG SL tablet Place 0.4 mg under the tongue every 5 (five) minutes as needed for chest pain.     tiZANidine (ZANAFLEX) 2 MG tablet Take 2 mg by mouth 2 (two) times daily as needed for muscle spasms.     No facility-administered medications prior to visit.     Allergies:   Hydrochlorothiazide, Amoxicillin-pot clavulanate, Clindamycin, Lisinopril, Meloxicam, Topiramate, and Tamsulosin   Social History   Socioeconomic History   Marital status: Married    Spouse name: Not on file   Number of children: Not on file   Years of education: Not on file   Highest education level: Not on file  Occupational History   Not on file  Tobacco Use   Smoking status: Some Days    Packs/day: 0.50    Years: 40.00    Pack years: 20.00    Types: Cigarettes   Smokeless tobacco: Never  Substance and Sexual Activity   Alcohol use: No    Alcohol/week: 0.0 standard drinks   Drug use: No   Sexual activity: Not  Currently  Other Topics Concern   Not on file  Social History Narrative   Not on file   Social Determinants of Health   Financial Resource Strain: Not on file  Food Insecurity: Not on file  Transportation Needs: Not on file  Physical Activity: Not on file  Stress: Not on file  Social Connections: Not on file     Family History:  The patient's family history includes Hyperlipidemia in his father; Hypertension in his father.   ROS:   Please see the history of present illness.    ROS All other systems reviewed and are negative.   PHYSICAL EXAM:   VS:  BP (!) 116/56 (BP Location: Left Arm, Patient Position: Sitting, Cuff Size: Large)   Pulse 94   Ht 5' 11.5" (1.816 m)   Wt 257 lb (116.6 kg)   SpO2 93%   BMI 35.34 kg/m       General: Alert, oriented x3, no distress, severely obese.  Healthy left subclavian defibrillator site. Head: no evidence of trauma, PERRL, EOMI, no exophtalmos or lid lag, no myxedema, no xanthelasma; normal ears, nose and oropharynx Neck: normal jugular venous pulsations and no hepatojugular reflux; brisk carotid pulses without delay and no carotid bruits Chest: Breath sounds are diminished throughout, but lungs are clear to auscultation, no signs of consolidation by percussion or palpation, normal fremitus, symmetrical and full respiratory excursions Cardiovascular: normal position and quality of the apical impulse, regular rhythm, normal first and second heart sounds, early peaking 1-2/6 aortic ejection murmur at right upper sternal border, no diastolic murmurs, rubs or gallops Abdomen: no tenderness or distention, no masses by palpation, no abnormal pulsatility or arterial bruits, normal bowel sounds, no hepatosplenomegaly Extremities: no clubbing, cyanosis or edema except for trivial swelling behind the medial malleolus of the right ankle; 2+ radial, ulnar and brachial pulses bilaterally; 2+ right femoral, posterior tibial and dorsalis pedis pulses; 2+  left femoral, posterior tibial and dorsalis pedis pulses; no subclavian or femoral bruits.  Good thrill and bruit over his AV fistula. Neurological: grossly nonfocal Psych: Normal mood and affect   Wt Readings from Last 3 Encounters:  04/18/21 257 lb (116.6 kg)  02/25/21 253 lb (114.8 kg)  09/13/20 259 lb 9.6 oz (117.8 kg)      Studies/Labs Reviewed:   Echo 03/17/2021   1. Left ventricular ejection fraction, by estimation, is 55 to 60%. The  left ventricle has normal function. The left ventricle has no regional  wall motion abnormalities. There is moderate concentric left ventricular  hypertrophy of the basal-septal  segment. Left ventricular diastolic function could not be evaluated.   2. Right ventricular systolic function is mildly reduced. The right  ventricular size is normal. There is normal pulmonary artery systolic  pressure. The estimated right ventricular systolic pressure is 62.2 mmHg.   3. Left atrial size was mildly dilated.   4. The mitral valve has been repaired/replaced. Mild mitral valve  regurgitation. No evidence of mitral stenosis. The mean mitral valve  gradient is 6.0 mmHg with HR 81bpm.   5. The aortic valve is tricuspid. There is moderate calcification of the  aortic valve. There is moderate thickening of the aortic valve. Aortic  valve regurgitation is moderate by PHT but visually appears moderate to  severe. Mild aortic valve stenosis.  Aortic regurgitation PHT measures 326 msec. Aortic valve mean gradient  measures 20.0 mmHg. Aortic valve Vmax measures 2.92 m/s.   6. Aortic dilatation noted. There is mild dilatation of the ascending  aorta, measuring 41 mm.   7. The inferior vena cava is normal in size with greater than 50%  respiratory variability, suggesting right atrial pressure of 3 mmHg.   8. Compared to prior echo, the mean MVG of the MV bioprosthesis has  increased from 4 to 45mmHg but HR faster on this study. AI appears to have  progressed.  The AI is moderate by PHT but visually appears moderate to  severe. Consider TEE for further  quantification of AI.    Myoview 10/10/2019 Nuclear stress EF: 49%. The left ventricular ejection fraction is mildly decreased (45-54%). Defect 1: There is a small defect of mild severity present in the basal anterolateral, mid anterolateral and apical lateral location. This is an intermediate risk study.   1. Reversible basal to apical anterolateral perfusion defect consistent with ischemia. 2. Septal hypokinesis, which may be due to paced rhythm  EKG:  EKG is ordered today.  It shows atrial sensed, biventricular paced rhythm with positive R wave in lead V1.  Recent Labs: 03/15/2021: ALT 31; BNP 91.9; TSH 3.710 04/22/2021: BUN 75; Creatinine, Ser 3.55; Hemoglobin 15.8; Platelets 238; Potassium 3.5; Sodium 135  07/22/2020 creatinine 2.66, potassium 3.6, hemoglobin A1c 8.1%-  Lipid Panel    Component Value Date/Time   CHOL 108 (L) 03/10/2016 0925   TRIG 228 (H) 03/10/2016 0925   HDL 28 (L) 03/10/2016 0925   CHOLHDL 3.9 03/10/2016 0925   VLDL 46 (H) 03/10/2016 0925   LDLCALC 34 03/10/2016 0925    12/30/2018 Total cholesterol 143, triglycerides 252, HDL 35 LDL 67  [facility] 02/14/2021 BNP 887, hemoglobin 13.5, potassium 3.5, BUN 64, creatinine 2.65, magnesium 2.5   ASSESSMENT:    1. Chronic combined systolic and diastolic heart failure ([place])   2. Nonrheumatic aortic insufficiency with aortic stenosis   3. Chronic obstructive pulmonary disease, unspecified COPD type (HCC)   4. Paroxysmal atrial  fibrillation ([place])   5. Encounter for monitoring amiodarone therapy   6. Long term (current) use of anticoagulants   7. Coronary artery disease involving native coronary artery of native heart without angina pectoris   8. History of mitral valve replacement with bioprosthetic valve   9. Biventricular ICD (implantable cardioverter-defibrillator) - [facility]   10. CKD (chronic kidney  disease) stage 4, GFR 15-29 ml/min (HCC)   11. Essential hypertension   12. Diabetes mellitus type 2 in obese ([place])   13. Mixed hyperlipidemia   14. Peripheral venous insufficiency   15. Severe obesity (BMI 35.0-39.9) with comorbidity ([place])      PLAN:  In order of problems listed above:  CHF: His physical exam is impeded by severe obesity, but I am inclined to say that he is euvolemic.  It remains unclear whether heart failure exacerbation triggered the atrial fibrillation or vice versa.  He began feeling unwell a couple of days before the atrial fibrillation occurred and he has not returned to baseline despite maintenance of normal rhythm for the last 10 days.  BNP was markedly elevated.  Still orthopneic, even after we increased his dose of diuretic.  EF by echo remains normal.  Unable to take RAAS inhibitors due to renal dysfunction.  Borderline indication for SGLT2 inhibitors.  [place] is only indicated for GFR more than 30, but we could use Jardiance down to GFR of 20.  Recheck BMP and renal function in about 10 days.  He has a follow-up appointment with his nephrologist later this month. AS/AI: Concern for possible severe AI.  His mean systolic gradient has increased from 10 mmHg to 20 mmHg: This could be progression of the aortic stenosis but could also represent increased flow due to severe.  We will schedule for transesophageal echo since the transthoracic images are nondiagnostic.  He is not a candidate for surgical aortic valve replacement, but could be a marginal candidate for TAVR. COPD: Unfortunately he continues to smoke.  He will always have residual dyspnea due to emphysema..  In the past dyspnea was primarily due to lung disease, as demonstrated by right and left heart catheterization. the PFT pattern and the response to bronchodilator suggests obstructive lung disease is the bigger problem.  He really needs to quit smoking. Paroxysmal atrial fibrillation: [place] 2022 was the first  time that he had atrial fibrillation since device implantation in 2017 and the first breakthrough episode of atrial fibrillation since we stopped amiodarone in 2020.  Unfortunately was associated with severe decompensation and hospitalization.   Amiodarone, monitor liver and thyroid function tests at least every 6 months, yearly eye exam, it will be difficult to distinguish amiodarone lung side effects from his other chronic conditions.   Anticoagulation: On Eliquis, no bleeding so far CAD: He does not have angina pectoris and had a low risk nuclear stress test a year ago.  Small area of lateral wall ischemia, good response to enhanced antianginal therapy with adding long-acting nitrates.  We are trying to avoid invasive therapy due to high risk of renal complications as well as bleeding, especially since he now requires anticoagulation.  Continue beta-blockers, aspirin, clopidogrel, long-acting nitrates, high-dose statin S/p MVR: Normal bioprosthetic valve function previous echocardiogram, but it is very likely that he had increased gradients across the valve during atrial fibrillation rapid ventricular response.  Repeat echo shows mean gradient 6 mmHg at 85 bpm (previously 4 mm Hg at 70 bpm).  Reminded him of the need for endocarditis prophylaxis.  CRT-D: Presenting  rhythm today is normal sinus with biventricular pacing.  He lost BiV pacing during atrial fibrillation which I am sure contributed to heart failure exacerbation.  Normal device function.  No reprogramming necessary today. CKD stage 4: Creatinine during hospitalization was at baseline at 2.65 and has been as high as 3.3 in the past.  GFR 20-25.  He already has a mature fistula that has not yet been used.  Nephrologist is Dr. [name].   HTN: well controlled DM: Currently on very low-dose glipizide for glucose control.   HLP: Labs followed by PCP.  Target LDL less than 70. Peripheral venous insufficiency/lymphedema:  Prominent bilateral  varicose veins.  This contributes to his edema, which is not entirely due to heart failure. Severe obesity: weight loss would be beneficial, but need to reestablish his dry weight.  Scheduled for TEE NOV 15 with Dr. Debara Pickett. This procedure has been fully reviewed with the patient and written informed consent has been obtained.   Medication Adjustments/Labs and Tests Ordered: Current medicines are reviewed at length with the patient today.  Concerns regarding medicines are outlined above.  Medication changes, Labs and Tests ordered today are listed in the Patient Instructions below. Patient Instructions  Medication Instructions:  No changes *If you need a refill on your cardiac medications before your next appointment, please call your pharmacy*   Lab Work: None ordered If you have labs (blood work) drawn today and your tests are completely normal, you will receive your results only by: [place] (if you have MyChart) OR A paper copy in the mail If you have any lab test that is abnormal or we need to change your treatment, we will call you to review the results.   Testing/Procedures: Your physician has requested that you have a TEE. During a TEE, sound waves are used to create images of your heart. It provides your doctor with information about the size and shape of your heart and how well your heart's chambers and valves are working. In this test, a transducer is attached to the end of a flexible tube that's guided down your throat and into your esophagus (the tube leading from you mouth to your stomach) to get a more detailed image of your heart. You are not awake for the procedure. Please see the instruction sheet given to you today. For further information please visit [email or web address].   Follow-Up: At [facility], you and your health needs are our priority.  As part of our continuing mission to provide you with exceptional heart care, we have created designated Provider Care  Teams.  These Care Teams include your primary Cardiologist (physician) and Advanced Practice Providers (APPs -  Physician Assistants and Nurse Practitioners) who all work together to provide you with the care you need, when you need it.  We recommend signing up for the patient portal called "MyChart".  Sign up information is provided on this After Visit Summary.  MyChart is used to connect with patients for Virtual Visits (Telemedicine).  Patients are able to view lab/test results, encounter notes, upcoming appointments, etc.  Non-urgent messages can be sent to your provider as well.   To learn more about what you can do with MyChart, go to [email or web address].    Your next appointment:   Download in 2 weeks 3 month follow up with Dr. Sallyanne Kuster  The format for your next appointment:   In Person  Provider:   [name], MD     Other Instructions  You are scheduled for a  TEE on 04/26/21 with Dr. Debara Pickett.  Please arrive at the [facility] (Main Entrance A) at [facility]: [address] [place], [place] [address] at 8 am. (1 hour prior to procedure unless lab work is needed)  DIET: Nothing to eat or drink after midnight except a sip of water with medications (see medication instructions below)  FYI: For your safety, and to allow [place] to monitor your vital signs accurately during the surgery/procedure we request that   if you have artificial nails, gel coating, SNS etc. Please have those removed prior to your surgery/procedure. Not having the nail coverings /polish removed may result in cancellation or delay of your surgery/procedure.   Medication Instructions: Hold all diabetic medication. Take half the dose of the Lantus that morning Hold the torsemide the morning of the procedure  Labs:  Your provider would like for you to return this week to have the following labs drawn: CBC and BMET. You do not need an appointment for the lab. Once in our office lobby there is a podium  where you can sign in and ring the doorbell to alert [place] that you are here. The lab is open from 8:00 am to 4:30 pm; closed for lunch from 12:45pm-1:45pm.  You may go to any [facility]   You must have a responsible person to drive you home and stay in the waiting area during your procedure. Failure to do so could result in cancellation.  Bring your insurance cards.  *Special Note: Every effort is made to have your procedure done on time. Occasionally there are emergencies that occur at the hospital that may cause delays. Please be patient if a delay does occur.     Signed, [name], MD  04/24/2021 4:35 PM    [address], [place], [place]  [address] Phone: [phone]; Fax: [phone]

## 2021-04-22 LAB — CBC
Hematocrit: 45.9 % (ref 37.5–51.0)
Hemoglobin: 15.8 g/dL (ref 13.0–17.7)
MCH: 30 pg (ref 26.6–33.0)
MCHC: 34.4 g/dL (ref 31.5–35.7)
MCV: 87 fL (ref 79–97)
Platelets: 238 10*3/uL (ref 150–450)
RBC: 5.26 x10E6/uL (ref 4.14–5.80)
RDW: 13.6 % (ref 11.6–15.4)
WBC: 8.4 10*3/uL (ref 3.4–10.8)

## 2021-04-22 LAB — BASIC METABOLIC PANEL
BUN/Creatinine Ratio: 21 (ref 10–24)
BUN: 75 mg/dL — ABNORMAL HIGH (ref 8–27)
CO2: 25 mmol/L (ref 20–29)
Calcium: 9.5 mg/dL (ref 8.6–10.2)
Chloride: 91 mmol/L — ABNORMAL LOW (ref 96–106)
Creatinine, Ser: 3.55 mg/dL — ABNORMAL HIGH (ref 0.76–1.27)
Glucose: 232 mg/dL — ABNORMAL HIGH (ref 70–99)
Potassium: 3.5 mmol/L (ref 3.5–5.2)
Sodium: 135 mmol/L (ref 134–144)
eGFR: 18 mL/min/{1.73_m2} — ABNORMAL LOW (ref >59)

## 2021-04-24 ENCOUNTER — Encounter: Payer: Self-pay | Admitting: Cardiovascular Disease

## 2021-04-25 ENCOUNTER — Encounter: Payer: Self-pay | Admitting: *Deleted

## 2021-04-25 ENCOUNTER — Other Ambulatory Visit: Payer: Self-pay | Admitting: *Deleted

## 2021-04-25 DIAGNOSIS — I352 Nonrheumatic aortic (valve) stenosis with insufficiency: Secondary | ICD-10-CM

## 2021-04-26 ENCOUNTER — Encounter (HOSPITAL_COMMUNITY): Payer: Self-pay | Admitting: Internal Medicine

## 2021-04-26 ENCOUNTER — Encounter (HOSPITAL_COMMUNITY)
Admission: RE | Disposition: A | Discharge status: Home / Self Care | Payer: Self-pay | Source: Home / Self Care | Attending: Internal Medicine

## 2021-04-26 ENCOUNTER — Ambulatory Visit (HOSPITAL_COMMUNITY): Payer: Medicare HMO | Admitting: Anesthesiology

## 2021-04-26 ENCOUNTER — Other Ambulatory Visit: Payer: Self-pay

## 2021-04-26 ENCOUNTER — Ambulatory Visit (HOSPITAL_BASED_OUTPATIENT_CLINIC_OR_DEPARTMENT_OTHER): Payer: Medicare HMO

## 2021-04-26 ENCOUNTER — Ambulatory Visit (HOSPITAL_COMMUNITY)
Admission: RE | Admit: 2021-04-26 | Discharge: 2021-04-26 | Disposition: A | Discharge status: Home / Self Care | Payer: Medicare HMO | Attending: Internal Medicine | Admitting: Internal Medicine

## 2021-04-26 DIAGNOSIS — I480 Paroxysmal atrial fibrillation: Secondary | ICD-10-CM | POA: Diagnosis not present

## 2021-04-26 DIAGNOSIS — Z95810 Presence of automatic (implantable) cardiac defibrillator: Secondary | ICD-10-CM | POA: Diagnosis not present

## 2021-04-26 DIAGNOSIS — Z7901 Long term (current) use of anticoagulants: Secondary | ICD-10-CM | POA: Diagnosis not present

## 2021-04-26 DIAGNOSIS — E1151 Type 2 diabetes mellitus with diabetic peripheral angiopathy without gangrene: Secondary | ICD-10-CM | POA: Diagnosis not present

## 2021-04-26 DIAGNOSIS — J449 Chronic obstructive pulmonary disease, unspecified: Secondary | ICD-10-CM | POA: Insufficient documentation

## 2021-04-26 DIAGNOSIS — I130 Hypertensive heart and chronic kidney disease with heart failure and stage 1 through stage 4 chronic kidney disease, or unspecified chronic kidney disease: Secondary | ICD-10-CM | POA: Insufficient documentation

## 2021-04-26 DIAGNOSIS — Z6835 Body mass index (BMI) 35.0-35.9, adult: Secondary | ICD-10-CM | POA: Insufficient documentation

## 2021-04-26 DIAGNOSIS — I350 Nonrheumatic aortic (valve) stenosis: Secondary | ICD-10-CM

## 2021-04-26 DIAGNOSIS — N184 Chronic kidney disease, stage 4 (severe): Secondary | ICD-10-CM | POA: Insufficient documentation

## 2021-04-26 DIAGNOSIS — Z953 Presence of xenogenic heart valve: Secondary | ICD-10-CM | POA: Insufficient documentation

## 2021-04-26 DIAGNOSIS — E782 Mixed hyperlipidemia: Secondary | ICD-10-CM | POA: Diagnosis not present

## 2021-04-26 DIAGNOSIS — I255 Ischemic cardiomyopathy: Secondary | ICD-10-CM | POA: Diagnosis not present

## 2021-04-26 DIAGNOSIS — I351 Nonrheumatic aortic (valve) insufficiency: Secondary | ICD-10-CM

## 2021-04-26 DIAGNOSIS — I2510 Atherosclerotic heart disease of native coronary artery without angina pectoris: Secondary | ICD-10-CM | POA: Diagnosis not present

## 2021-04-26 DIAGNOSIS — I352 Nonrheumatic aortic (valve) stenosis with insufficiency: Secondary | ICD-10-CM

## 2021-04-26 DIAGNOSIS — E1122 Type 2 diabetes mellitus with diabetic chronic kidney disease: Secondary | ICD-10-CM | POA: Diagnosis not present

## 2021-04-26 DIAGNOSIS — I361 Nonrheumatic tricuspid (valve) insufficiency: Secondary | ICD-10-CM | POA: Diagnosis not present

## 2021-04-26 DIAGNOSIS — I082 Rheumatic disorders of both aortic and tricuspid valves: Secondary | ICD-10-CM | POA: Diagnosis present

## 2021-04-26 DIAGNOSIS — E1140 Type 2 diabetes mellitus with diabetic neuropathy, unspecified: Secondary | ICD-10-CM | POA: Diagnosis not present

## 2021-04-26 DIAGNOSIS — I5042 Chronic combined systolic (congestive) and diastolic (congestive) heart failure: Secondary | ICD-10-CM | POA: Diagnosis not present

## 2021-04-26 DIAGNOSIS — I890 Lymphedema, not elsewhere classified: Secondary | ICD-10-CM | POA: Diagnosis not present

## 2021-04-26 HISTORY — PX: TEE WITHOUT CARDIOVERSION: SHX5443

## 2021-04-26 LAB — GLUCOSE, CAPILLARY: Glucose-Capillary: 214 mg/dL — ABNORMAL HIGH (ref 70–99)

## 2021-04-26 LAB — ECHO TEE: P 1/2 time: 359 msec

## 2021-04-26 SURGERY — ECHOCARDIOGRAM, TRANSESOPHAGEAL
Anesthesia: Monitor Anesthesia Care

## 2021-04-26 MED ORDER — OXYCODONE HCL 5 MG PO TABS: 5.0000 mg | ORAL_TABLET | Freq: Once | ORAL | Status: DC | PRN

## 2021-04-26 MED ORDER — OXYCODONE HCL 5 MG/5ML PO SOLN: 5.0000 mg | Freq: Once | ORAL | Status: DC | PRN

## 2021-04-26 MED ORDER — PROPOFOL 500 MG/50ML IV EMUL
INTRAVENOUS | Status: DC | PRN
Start: 2021-04-26 — End: 2021-04-26
  Administered 2021-04-26: 100 ug/kg/min via INTRAVENOUS

## 2021-04-26 MED ORDER — FENTANYL CITRATE (PF) 100 MCG/2ML IJ SOLN: 25.0000 ug | INTRAMUSCULAR | Status: DC | PRN

## 2021-04-26 MED ORDER — ONDANSETRON HCL 4 MG/2ML IJ SOLN: 4.0000 mg | Freq: Four times a day (QID) | INTRAMUSCULAR | Status: DC | PRN

## 2021-04-26 MED ORDER — PROPOFOL 10 MG/ML IV BOLUS
INTRAVENOUS | Status: DC | PRN
  Administered 2021-04-26 (×2): 10 mg via INTRAVENOUS

## 2021-04-26 MED ORDER — SODIUM CHLORIDE 0.9 % IV SOLN: INTRAVENOUS | Status: DC

## 2021-04-26 NOTE — Discharge Instructions (Signed)

## 2021-04-26 NOTE — CV Procedure (Signed)
TRANSESOPHAGEAL ECHOCARDIOGRAM (TEE) NOTE  INDICATIONS: Aortic insufficiency  PROCEDURE:   Informed consent was obtained prior to the procedure. The risks, benefits and alternatives for the procedure were discussed and the patient comprehended these risks.  Risks include, but are not limited to, cough, sore throat, vomiting, nausea, somnolence, esophageal and stomach trauma or perforation, bleeding, low blood pressure, aspiration, pneumonia, infection, trauma to the teeth and death.    After a procedural time-out, the patient was given propofol per anesthesia for sedation.  The patient's heart rate, blood pressure, and oxygen saturation are monitored continuously during the procedure.The oropharynx was anesthetized with topical cetacaine.  The transesophageal probe was inserted in the esophagus and stomach without difficulty and multiple views were obtained.  The patient was kept under observation until the patient left the procedure room. I was present face-to-face 100% of this time. The patient left the procedure room in stable condition.   Agitated microbubble saline contrast was not administered.  COMPLICATIONS:    There were no immediate complications.  Findings:  LEFT VENTRICLE: The left ventricular wall thickness is moderate.  The left ventricular cavity is normal in size. Wall motion is normal.  LVEF is 55-60%.  RIGHT VENTRICLE:  The right ventricle is mildly decreased in systolic function without any thrombus or masses. Device leads are noted.   LEFT ATRIUM:  The left atrium is mildly dilated in size without any thrombus or masses.  There is not spontaneous echo contrast ("smoke") in the left atrium consistent with a low flow state.  LEFT ATRIAL APPENDAGE:  The left atrial appendage is free of any thrombus or masses. The appendage has single lobes. Pulse doppler indicates moderate flow in the appendage.  ATRIAL SEPTUM:  The atrial septum appears intact and is free of thrombus  and/or masses.  There is no evidence for interatrial shunting by color doppler and saline microbubble.  RIGHT ATRIUM:  The right atrium is normal in size and function without any thrombus or masses.  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  There were no vegetations or stenosis. Valve appears normal.  AORTIC VALVE:  The aortic valve is trileaflet, but the leaflets are thickened with restricted leaflet motion. There is Moderate central regurgitation.  There were no vegetations. There is mild stenosis  TRICUSPID VALVE:  The tricuspid valve is normal in structure and function with  mild to moderate  regurgitation.  There were no vegetations or stenosis. Device leads are noted.   PULMONIC VALVE:  The pulmonic valve is normal in structure and function with  trivial  regurgitation.  There were no vegetations or stenosis.   AORTIC ARCH, ASCENDING AND DESCENDING AORTA:  There was no Ron Parker et. Al, 1992) atherosclerosis of the ascending aorta, aortic arch, or proximal descending aorta.  12. PULMONARY VEINS: Anomalous pulmonary venous return was not noted.  13. PERICARDIUM: The pericardium appeared normal and non-thickened.  There is no pericardial effusion.  IMPRESSION:   No LAA thrombus Normal 31 mm bioprosthetic mitral valve Mild AS, leaflet thickening with moderate central AI LVEF 55-60%  RECOMMENDATIONS:    Further monitoring of degree of AI is recommended.  Time Spent Directly with the Patient:  60 minutes   [name], MD, [facility] [place] Director of the [facility] Lipid Disorders &  Cardiovascular Risk Reduction Clinic Diplomate of the American Board of Clinical Lipidology Attending Cardiologist  Direct Dial: [phone]  Fax: [phone]  Website:  [email or web address] [email or web address] Esther Broyles 04/26/2021, 10:37 AM

## 2021-04-26 NOTE — Transfer of Care (Signed)
Immediate Anesthesia Transfer of Care Note  Patient: Dominic [name]  Procedure(s) Performed: TRANSESOPHAGEAL ECHOCARDIOGRAM (TEE)  Patient Location: Endoscopy Unit  Anesthesia Type:MAC  Level of Consciousness: awake, alert  and oriented  Airway & Oxygen Therapy: Patient Spontanous Breathing and Patient connected to nasal cannula oxygen  Post-op Assessment: Report given to RN and Post -op Vital signs reviewed and stable  Post vital signs: Reviewed and stable  Last Vitals:  Vitals Value Taken Time  BP 136/33 04/26/21 1030  Temp    Pulse 81 04/26/21 1030  Resp 14 04/26/21 1030  SpO2 95 % 04/26/21 1030    Last Pain:  Vitals:   04/26/21 1030  TempSrc:   PainSc: 0-No pain         Complications: No notable events documented.

## 2021-04-26 NOTE — Progress Notes (Signed)
  Echocardiogram Echocardiogram Transesophageal has been performed.  Dominic [name] 04/26/2021, 10:50 AM

## 2021-04-26 NOTE — Interval H&P Note (Signed)
History and Physical Interval Note:  04/26/2021 8:35 AM  Dominic [name]  has presented today for surgery, with the diagnosis of AORTIC INSUFFICIENCY.  The various methods of treatment have been discussed with the patient and family. After consideration of risks, benefits and other options for treatment, the patient has consented to  Procedure(s): TRANSESOPHAGEAL ECHOCARDIOGRAM (TEE) (N/A) as a surgical intervention.  The patient's history has been reviewed, patient examined, no change in status, stable for surgery.  I have reviewed the patient's chart and labs.  Questions were answered to the patient's satisfaction.     [name]

## 2021-04-26 NOTE — Anesthesia Preprocedure Evaluation (Signed)
Anesthesia Evaluation  Patient identified by MRN, date of birth, ID band Patient awake    Reviewed: Allergy & Precautions, H&P , NPO status , Patient's Chart, lab work & pertinent test results  Airway Mallampati: II   Neck ROM: full    Dental   Pulmonary COPD, Current Smoker and Patient abstained from smoking.,    breath sounds clear to auscultation       Cardiovascular hypertension, + CAD, + CABG and +CHF  + Cardiac Defibrillator + Valvular Problems/Murmurs AI  Rhythm:regular Rate:Normal  S/p MVR   Neuro/Psych    GI/Hepatic   Endo/Other  diabetes, Type 2  Renal/GU Renal InsufficiencyRenal disease     Musculoskeletal   Abdominal   Peds  Hematology   Anesthesia Other Findings   Reproductive/Obstetrics                             Anesthesia Physical Anesthesia Plan  ASA: 3  Anesthesia Plan: MAC   Post-op Pain Management:    Induction: Intravenous  PONV Risk Score and Plan: 1 and Propofol infusion and Treatment may vary due to age or medical condition  Airway Management Planned: Nasal Cannula  Additional Equipment:   Intra-op Plan:   Post-operative Plan:   Informed Consent: I have reviewed the patients History and Physical, chart, labs and discussed the procedure including the risks, benefits and alternatives for the proposed anesthesia with the patient or authorized representative who has indicated his/her understanding and acceptance.     Dental advisory given  Plan Discussed with: CRNA, Anesthesiologist and Surgeon  Anesthesia Plan Comments:         Anesthesia Quick Evaluation

## 2021-04-26 NOTE — Anesthesia Procedure Notes (Signed)
Procedure Name: MAC Date/Time: 04/26/2021 9:50 AM Performed by: Griffin Dakin, CRNA Pre-anesthesia Checklist: Patient identified, Emergency Drugs available, Suction available, Timeout performed and Patient being monitored Patient Re-evaluated:Patient Re-evaluated prior to induction Oxygen Delivery Method: Nasal cannula Induction Type: IV induction Placement Confirmation: positive ETCO2 and breath sounds checked- equal and bilateral Dental Injury: Teeth and Oropharynx as per pre-operative assessment

## 2021-04-27 NOTE — Anesthesia Postprocedure Evaluation (Signed)
Anesthesia Post Note  Patient: Dominic [name]  Procedure(s) Performed: TRANSESOPHAGEAL ECHOCARDIOGRAM (TEE)     Patient location during evaluation: Endoscopy Anesthesia Type: MAC Level of consciousness: awake and alert Pain management: pain level controlled Vital Signs Assessment: post-procedure vital signs reviewed and stable Respiratory status: spontaneous breathing, nonlabored ventilation, respiratory function stable and patient connected to nasal cannula oxygen Cardiovascular status: stable and blood pressure returned to baseline Postop Assessment: no apparent nausea or vomiting Anesthetic complications: no   No notable events documented.  Last Vitals:  Vitals:   04/26/21 1040 04/26/21 1050  BP: (!) 128/42 (!) 156/55  Pulse: 76 73  Resp: 17 10  Temp:    SpO2: 94% 94%    Last Pain:  Vitals:   04/27/21 1309  TempSrc:   PainSc: 0-No pain                 Brewster Wolters S

## 2021-04-28 ENCOUNTER — Encounter (HOSPITAL_COMMUNITY): Payer: Self-pay | Admitting: Internal Medicine

## 2021-05-09 ENCOUNTER — Ambulatory Visit (INDEPENDENT_AMBULATORY_CARE_PROVIDER_SITE_OTHER): Payer: Medicare HMO

## 2021-05-09 DIAGNOSIS — I5043 Acute on chronic combined systolic (congestive) and diastolic (congestive) heart failure: Secondary | ICD-10-CM | POA: Diagnosis not present

## 2021-05-09 LAB — CUP PACEART REMOTE DEVICE CHECK
Battery Remaining Longevity: 36 mo
Battery Remaining Percentage: 48 %
Brady Statistic RA Percent Paced: 0 %
Brady Statistic RV Percent Paced: 100 %
Date Time Interrogation Session: 20221128000200
HighPow Impedance: 100 Ohm
Implantable Pulse Generator Implant Date: 20170119
Lead Channel Impedance Value: 407 Ohm
Lead Channel Impedance Value: 453 Ohm
Lead Channel Impedance Value: 617 Ohm
Lead Channel Pacing Threshold Amplitude: 0.7 V
Lead Channel Pacing Threshold Amplitude: 1.1 V
Lead Channel Pacing Threshold Amplitude: 1.1 V
Lead Channel Pacing Threshold Pulse Width: 0.4 ms
Lead Channel Pacing Threshold Pulse Width: 0.4 ms
Lead Channel Pacing Threshold Pulse Width: 0.7 ms
Lead Channel Setting Pacing Amplitude: 2 V
Lead Channel Setting Pacing Amplitude: 2.6 V
Lead Channel Setting Pacing Amplitude: 2.8 V
Lead Channel Setting Pacing Pulse Width: 0.4 ms
Lead Channel Setting Pacing Pulse Width: 0.7 ms
Lead Channel Setting Sensing Sensitivity: 0.6 mV
Lead Channel Setting Sensing Sensitivity: 1 mV
Pulse Gen Serial Number: 160713

## 2021-05-16 ENCOUNTER — Ambulatory Visit: Payer: Medicare HMO | Admitting: Cardiovascular Disease

## 2021-05-16 NOTE — Progress Notes (Signed)
Remote ICD transmission.   

## 2021-05-17 ENCOUNTER — Encounter: Payer: Self-pay | Admitting: Cardiovascular Disease

## 2021-05-17 ENCOUNTER — Other Ambulatory Visit: Payer: Self-pay

## 2021-05-17 ENCOUNTER — Ambulatory Visit (INDEPENDENT_AMBULATORY_CARE_PROVIDER_SITE_OTHER): Payer: Medicare HMO | Admitting: Cardiovascular Disease

## 2021-05-17 VITALS — BP 120/60 | HR 91 | Ht 71.5 in | Wt 256.4 lb

## 2021-05-17 DIAGNOSIS — E669 Obesity, unspecified: Secondary | ICD-10-CM

## 2021-05-17 DIAGNOSIS — I5032 Chronic diastolic (congestive) heart failure: Secondary | ICD-10-CM

## 2021-05-17 DIAGNOSIS — I480 Paroxysmal atrial fibrillation: Secondary | ICD-10-CM | POA: Diagnosis not present

## 2021-05-17 DIAGNOSIS — J449 Chronic obstructive pulmonary disease, unspecified: Secondary | ICD-10-CM

## 2021-05-17 DIAGNOSIS — I10 Essential (primary) hypertension: Secondary | ICD-10-CM

## 2021-05-17 DIAGNOSIS — E1169 Type 2 diabetes mellitus with other specified complication: Secondary | ICD-10-CM

## 2021-05-17 DIAGNOSIS — Z953 Presence of xenogenic heart valve: Secondary | ICD-10-CM

## 2021-05-17 DIAGNOSIS — I352 Nonrheumatic aortic (valve) stenosis with insufficiency: Secondary | ICD-10-CM | POA: Diagnosis not present

## 2021-05-17 DIAGNOSIS — I25118 Atherosclerotic heart disease of native coronary artery with other forms of angina pectoris: Secondary | ICD-10-CM

## 2021-05-17 DIAGNOSIS — Z79899 Other long term (current) drug therapy: Secondary | ICD-10-CM

## 2021-05-17 DIAGNOSIS — N184 Chronic kidney disease, stage 4 (severe): Secondary | ICD-10-CM

## 2021-05-17 DIAGNOSIS — Z95810 Presence of automatic (implantable) cardiac defibrillator: Secondary | ICD-10-CM

## 2021-05-17 DIAGNOSIS — Z7901 Long term (current) use of anticoagulants: Secondary | ICD-10-CM

## 2021-05-17 DIAGNOSIS — Z5181 Encounter for therapeutic drug level monitoring: Secondary | ICD-10-CM

## 2021-05-17 MED ORDER — METOLAZONE 2.5 MG PO TABS: 2.5000 mg | ORAL_TABLET | ORAL | 11 refills | Status: DC

## 2021-05-17 NOTE — Patient Instructions (Addendum)
Medication Instructions:  Metolazone: Take one tablet (2.5 mg) three times a week  *If you need a refill on your cardiac medications before your next appointment, please call your pharmacy*   Lab Work: None ordered If you have labs (blood work) drawn today and your tests are completely normal, you will receive your results only by: [place] (if you have MyChart) OR A paper copy in the mail If you have any lab test that is abnormal or we need to change your treatment, we will call you to review the results.   Testing/Procedures: None ordered   Follow-Up: At [facility], you and your health needs are our priority.  As part of our continuing mission to provide you with exceptional heart care, we have created designated Provider Care Teams.  These Care Teams include your primary Cardiologist (physician) and Advanced Practice Providers (APPs -  Physician Assistants and Nurse Practitioners) who all work together to provide you with the care you need, when you need it.  We recommend signing up for the patient portal called "MyChart".  Sign up information is provided on this After Visit Summary.  MyChart is used to connect with patients for Virtual Visits (Telemedicine).  Patients are able to view lab/test results, encounter notes, upcoming appointments, etc.  Non-urgent messages can be sent to your provider as well.   To learn more about what you can do with MyChart, go to [email or web address].    Your next appointment:   A referral has been placed to the Structural Team for an appointment Keep your follow up with Dr. Sallyanne Kuster in February

## 2021-05-17 NOTE — Progress Notes (Signed)
`   Cardiology Office Note    Date:  05/17/2021   ID:  Dominic [name], DOB May 19, 1951, MRN [id number]  PCP:  [name], FNP  Cardiologist:   [name], MD   Chief Complaint  Patient presents with   Cardiac Valve Problem    History of Present Illness:  Dominic [name] is a 70 y.o. 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  LV pacing vector is LV ring 3 to can), moderate to severe COPD, moderate to severe chronic kidney disease, type 2 diabetes mellitus complicated by nephropathy and neuropathy, history of postoperative atrial fibrillation (that has not been subsequently seen on his defibrillator checks), recent episode of CHF exacerbation and AFib.  He continues to have severe shortness of breath, NYHA functional class III-IV.  He sleeps on 2-3 pillows. He is short of breath getting his clothes on.  He does not have edema at the beginning of the day, but his ankles swell towards the end of the day.  He is currently taking torsemide 120 mg every day (in 2 divided doses) and takes metolazone 2.5 mg twice a week.  He does notice increased urine output after metolazone and thinks that he breathes better after metolazone.  He denies angina pectoris.  He has not had dizziness or syncope or palpitations.  His weight is essentially unchanged at 256 pounds.  He continues to smoke, roughly 1 pack daily.  He has not had a lot of problems with wheezing.  His inhaler does not seem to help with shortness of breath.  Presenting rhythm today is atrial sensed (sinus) biventricular paced.  Interrogation of his biventricular pacemaker shows that he has not had any recurrent atrial fibrillation since his cardioversion and he has 100% biventricular pacing efficiency.  His TEE on 04/26/2021 was interpreted as showing moderate AI. Transvalvular gradients  were not reported, but the mean gradient was 20 mm Hg on TTE in October. The AI PHT was 350 ms (comparable to 326 ms on TTE). LVOT diameter on the TTE was overestimated at 2.8 cm. DVI was 0.39  - It is very challenging to measure his LVOT accurately due to shadowing from the mitral prosthetic annulus.  I think the LVOT actually measures around 2.3 cm, adjusting for this the actual calculated aortic valve area would be approximately 1.6 cm, consistent with mild-moderate aortic stenosis and moderate to severe AI.  Morphologically, he has a 3 leaflet aortic valve with moderate thickening and moderate calcification.  The noncoronary cusp on the left coronary cusp look relatively restrictive and motion.  I think there is enough degenerative change that his aortic valve could not hold a TAVR stent.    For the first time in years he required hospitalization for congestive heart failure on 02/14/2021.  On presentation he was found to be in atrial fibrillation with rapid ventricular response.  He reports beginning to feel unwell 1 or 2 days before his hospitalization, with worsening exertional dyspnea.  He was not aware of any palpitations, but on arrival to the emergency room he had atrial fibrillation with RVR that did not respond to treatment with intravenous amiodarone and metoprolol so that he was eventually cardioverted with a single shock at 200 J on the day of admission.  He was started on Eliquis.  The notes from [facility] state that he was evaluated by nephrology while in the  hospital and that his renal function was stable.  Labs performed during his hospitalization [place] included a BNP of 882 (baseline 92 in April 2021) and a creatinine of 2.65, BUN 64 (estimated GFR 25), potassium 3.5.  Hemoglobin is normal at 13.5.    His most recent labs from 04/22/2021 showed the creatinine is up to 3.55, potassium 3.5. BNP on 03/15/2021 was back to normal at 91.9.    He has a right arm AV fistula that is ready  for hemodialysis when this should become necessary.  His nephrologist is Dr. [name].  He has not had angina pectoris since the addition of isosorbide mononitrate.  Nuclear stress test in April 2021 showed a small area of lateral wall ischemia.  Noninvasive/conservative approach recommended due to his renal failure.  His last coronary angiogram was performed in 2016 when he received 2 stents.  Dominic [name] had severe ischemic cardiomyopathy with left ventricular ejection fraction estimated to be approximately 30% by echocardiogram performed in October 2016. That echo describes septal and apical akinesis with global hypokinesis. He had evidence of restrictive filling as well as moderate right ventricular systolic dysfunction and moderate aortic insufficiency. Echo in May 2017 (after PCI and CRT) shows improved LVEF 45-50% and normal function of the mitral valve prosthesis. Right heart catheterization in May 2017 showed right atrial pressure 8, mean wedge pressure 6, cardiac index 2.2 L/minute.   He has an extensive history of coronary artery disease. He tells me that he thinks he has had 9 cardiac catheterizations and 5 stents. At least on one admission he had 2 episodes of cardiac arrest. I think this was during his hospitalization for bypass surgery/valve replacement. He underwent bypass surgery in [place] in 2015 with synchronous mitral valve replacement with a biological prosthesis. He has a history of previous stent placed in the LAD artery, type of stent and date of procedure unknown. He underwent placement of a bare metal stent to the ostium of the left circumflex coronary artery in September 2015. He subsequently underwent placement of 2 drug-eluting stents at [facility] in October 2016, placed in the right coronary artery and left circumflex coronary artery respectively. At the time of that cardiac catheterization he had 50% stenosis in the distal left main, 90% proximal LAD, 70% in-stent  restenosis proximal LAD, patent LIMA to distal LAD, 80% RCA. No mention is made of his second bypass graft, which I presume is occluded. He is on combination aspirin/clopidogrel.  At the time of his bypass surgery received a 31 mm [employer] mitral bioprosthetic valve. By echocardiogram last October 2016 the mean transvalvular gradient was 5 mmHg. The same echocardiogram described a trileaflet unrestricted aortic valve with moderate insufficiency.  His chart describes a history of postoperative atrial fibrillation. Amiodarone was stopped after several years without Afib, but this recurred in September 2022. Amiodarone and anticoagulation were restarted.  He has a history of left bundle branch block and he received a CRT-D [employer] device on 07/01/2015. The device and the leads are MRI conditional.  He has advanced chronic kidney disease. With recent baseline creatinine around 3.0-3.5. During his hospitalization in January 2017 the peak creatinine reached 4.6 (acute diarrheal illness), reaching a similar level in August 2017 when he had nausea and vomiting.[name] He has type 2 diabetes mellitus (not requiring insulin), complicated by kidney disease and neuropathy.  He takes atorvastatin in a relatively low dose. He has long-standing treated hypertension. He is a former smoker and has a history  of COPD and takes bronchodilators. He has a history of migraine headaches treated with Fioricet. He underwent sigmoid colectomy in November 2017 for colon cancer.  Past Medical History:  Diagnosis Date   Biventricular ICD (implantable cardioverter-defibrillator) - [facility] 09/05/2015   Jul 01, 2015 [facility] Dynagen X4   CAD (coronary artery disease) 09/05/2015   CABG 2015 PCI-BMS ostial LCX Sept 2015 04/05/2015 cath 50% distal LM, 90% prox LAD, old stent prox LAD 70% ISR, patent LIMA-LAD, 80% prox RCA PCI-DES x2 RCA and LCX    Chronic systolic CHF (congestive heart failure) ([place]) 09/05/2015   a. [place] 10/29/15:  PCW 8, CI 2.2. Overall low filling presssures. Mean PA 20; b. echo 10/30/15: EF 45-50%, not tech suff to allwo for LV diastolic fxn, mild AI, nl appearing MVR   CKD stage 4 due to type 2 diabetes mellitus ([place]) 09/05/2015   Diabetes mellitus, type 2 ([place]) 09/05/2015   Essential hypertension 09/05/2015   History of mitral valve replacement with bioprosthetic valve 09/05/2015   31 mm Edwards 2015, Dr. Jerelene Redden   Postoperative atrial fibrillation ([place]) 09/05/2015    Past Surgical History:  Procedure Laterality Date   CARDIAC CATHETERIZATION N/A 10/29/2015   Procedure: Right Heart Cath;  Surgeon: Jolaine Artist, MD;  Location: [place] [facility];  Service: Cardiovascular;  Laterality: N/A;   CARDIAC VALVE REPLACEMENT  2015   46mm Edwards bioprosthesis   CORONARY ANGIOPLASTY     CORONARY ARTERY BYPASS GRAFT  2015   [facility], Dr. Jerelene Redden.   TEE WITHOUT CARDIOVERSION N/A 04/26/2021   Procedure: TRANSESOPHAGEAL ECHOCARDIOGRAM (TEE);  Surgeon: [name], MD;  Location: [facility] ENDOSCOPY;  Service: Cardiovascular;  Laterality: N/A;    Current Medications: Outpatient Medications Prior to Visit  Medication Sig Dispense Refill   albuterol (PROVENTIL HFA;VENTOLIN HFA) 108 (90 Base) MCG/ACT inhaler Inhale 2 puffs into the lungs every 6 (six) hours as needed for wheezing or shortness of breath.     amiodarone (PACERONE) 200 MG tablet Take 200 mg by mouth 2 (two) times daily.     apixaban (ELIQUIS) 5 MG TABS tablet Take 5 mg by mouth 2 (two) times daily.     atorvastatin (LIPITOR) 40 MG tablet Take 40 mg by mouth daily.     B Complex-C (SUPER B COMPLEX PO) Take 1 capsule by mouth daily.     Cholecalciferol (VITAMIN D) 50 MCG (2000 UT) tablet Take 2,000 Units by mouth daily.     docusate sodium (COLACE) 100 MG capsule Take 100 mg by mouth 2 (two) times daily.     DULoxetine (CYMBALTA) 60 MG capsule Take 60 mg by mouth daily.      famotidine (PEPCID) 20 MG tablet Take 10 mg by mouth daily.     ferrous sulfate  325 (65 FE) MG tablet Take 325 mg by mouth daily with breakfast.     fluticasone (FLONASE) 50 MCG/ACT nasal spray Place 1 spray into both nostrils daily as needed for allergies.     fluticasone-salmeterol (ADVAIR HFA) 45-21 MCG/ACT inhaler Inhale 1 puff into the lungs 2 (two) times daily.     glipiZIDE (GLUCOTROL) 5 MG tablet Take 5 mg by mouth daily before breakfast.     glucose blood test strip 1 each by Other route as needed for other. Use as instructed     HYDROcodone-acetaminophen (NORCO/VICODIN) 5-325 MG tablet Take 1 tablet by mouth every 6 (six) hours as needed for moderate pain.     hydrocortisone cream 1 % Apply 1 application  topically daily as needed for itching.     Insulin Pen Needle (PEN NEEDLES 3/16") 31G X 5 MM MISC Use with insulin pen daily     isosorbide mononitrate (IMDUR) 30 MG 24 hr tablet TAKE 1 TABLET BY MOUTH EVERY DAY 90 tablet 2   LANTUS SOLOSTAR 100 UNIT/ML Solostar Pen Inject 56 Units into the skin daily.     mirtazapine (REMERON) 7.5 MG tablet Take 7.5 mg by mouth at bedtime.     nitroGLYCERIN (NITROSTAT) 0.4 MG SL tablet Place 0.4 mg under the tongue every 5 (five) minutes as needed for chest pain.     polyethylene glycol powder (GLYCOLAX/MIRALAX) 17 GM/SCOOP powder Take 1 Container by mouth daily.     potassium chloride SA (KLOR-CON) 20 MEQ tablet TAKE 2 TABLETS BY MOUTH THREE TIMES A DAY 180 tablet 5   sucralfate (CARAFATE) 1 g tablet Take 1 g by mouth 2 (two) times daily.     TECHLITE PEN NEEDLES 31G X 5 MM MISC USE WITH INSULIN PEN DAILY     tiZANidine (ZANAFLEX) 2 MG tablet Take 2 mg by mouth 2 (two) times daily as needed for muscle spasms.     torsemide (DEMADEX) 20 MG tablet TAKE 4 TABLETS (80 MG) BY MOUTH TWICE DAILY (Patient taking differently: Take 60 mg by mouth 2 (two) times daily.) 240 tablet 3   metolazone (ZAROXOLYN) 2.5 MG tablet TAKE 1 TABLET BY MOUTH EVERY DAY AS NEEDED FOR WEIGHT GAIN GREATER THAN 4 POUNDS (Patient taking differently: Take 2.5 mg by  mouth 2 (two) times a week. TAKE 1 TABLET BY MOUTH EVERY DAY AS NEEDED FOR WEIGHT GAIN GREATER THAN 4 POUNDS) 90 tablet 3   No facility-administered medications prior to visit.     Allergies:   Hydrochlorothiazide, Amoxicillin-pot clavulanate, Clindamycin, Lisinopril, Meloxicam, Topiramate, and Tamsulosin   Social History   Socioeconomic History   Marital status: Married    Spouse name: Not on file   Number of children: Not on file   Years of education: Not on file   Highest education level: Not on file  Occupational History   Not on file  Tobacco Use   Smoking status: Some Days    Packs/day: 0.50    Years: 40.00    Pack years: 20.00    Types: Cigarettes   Smokeless tobacco: Never  Substance and Sexual Activity   Alcohol use: No    Alcohol/week: 0.0 standard drinks   Drug use: No   Sexual activity: Not Currently  Other Topics Concern   Not on file  Social History Narrative   Not on file   Social Determinants of Health   Financial Resource Strain: Not on file  Food Insecurity: Not on file  Transportation Needs: Not on file  Physical Activity: Not on file  Stress: Not on file  Social Connections: Not on file     Family History:  The patient's family history includes Hyperlipidemia in his father; Hypertension in his father.   ROS:   Please see the history of present illness.    ROS All other systems reviewed and are negative.   PHYSICAL EXAM:   VS:  BP 120/60 (BP Location: Left Arm)   Pulse 91   Ht 5' 11.5" (1.816 m)   Wt 256 lb 6.4 oz (116.3 kg)   SpO2 93%   BMI 35.26 kg/m      General: Alert, oriented x3, no distress, obese.  Healthy left subclavian defibrillator site Head: no evidence of trauma, PERRL,  EOMI, no exophtalmos or lid lag, no myxedema, no xanthelasma; normal ears, nose and oropharynx Neck: normal jugular venous pulsations and no hepatojugular reflux; brisk carotid pulses without delay and no carotid bruits Chest: Severely diminished breath  sounds throughout but otherwise clear to auscultation, no signs of consolidation by percussion or palpation, normal fremitus, symmetrical and full respiratory excursions Cardiovascular: normal position and quality of the apical impulse, regular rhythm, very distant normal first and second heart sounds, 1/6 early peaking aortic ejection murmur and 1/6 decrescendo diastolic murmur at the left lower sternal border, no rubs or gallops Abdomen: no tenderness or distention, no masses by palpation, no abnormal pulsatility or arterial bruits, normal bowel sounds, no hepatosplenomegaly Extremities: no clubbing, cyanosis or edema; 2+ radial, ulnar and brachial pulses bilaterally; 2+ right femoral, posterior tibial and dorsalis pedis pulses; 2+ left femoral, posterior tibial and dorsalis pedis pulses; no subclavian or femoral bruits Neurological: grossly nonfocal Psych: Normal mood and affect    Wt Readings from Last 3 Encounters:  05/17/21 256 lb 6.4 oz (116.3 kg)  04/26/21 249 lb (112.9 kg)  04/18/21 257 lb (116.6 kg)      Studies/Labs Reviewed:   TEE 04/26/2021  1. Left ventricular ejection fraction, by estimation, is 55 to 60%. The  left ventricle has normal function. There is moderate left ventricular  hypertrophy.   2. Right ventricular systolic function is mildly reduced. The right  ventricular size is normal.   3. Left atrial size was mildly dilated. No left atrial/left atrial  appendage thrombus was detected.   4. 2D/3D visualization. The mitral valve has been repaired/replaced.  Trivial mitral valve regurgitation. No evidence of mitral stenosis. The  mean mitral valve gradient is 4.0 mmHg with average heart rate of 87 bpm.  There is a 31 mm Edwards bioprosthetic   valve present in the mitral position. Procedure Date: 06/12/2013. Echo  findings are consistent with normal structure and function of the mitral  valve prosthesis.   5. The tricuspid valve is abnormal. Tricuspid valve  regurgitation is mild  to moderate.   6. The aortic valve is tricuspid. There is mild calcification of the  aortic valve. There is moderate thickening of the aortic valve. Aortic  valve regurgitation is moderate. Mild aortic valve stenosis. Aortic  regurgitation PHT measures 359 msec.   Comparison(s): 03/17/2021: LVEF 55-60%, MV gradient 6 mmHg, moderate to  severe AI, mild AS.   Echo 03/17/2021   1. Left ventricular ejection fraction, by estimation, is 55 to 60%. The  left ventricle has normal function. The left ventricle has no regional  wall motion abnormalities. There is moderate concentric left ventricular  hypertrophy of the basal-septal  segment. Left ventricular diastolic function could not be evaluated.   2. Right ventricular systolic function is mildly reduced. The right  ventricular size is normal. There is normal pulmonary artery systolic  pressure. The estimated right ventricular systolic pressure is 25.8 mmHg.   3. Left atrial size was mildly dilated.   4. The mitral valve has been repaired/replaced. Mild mitral valve  regurgitation. No evidence of mitral stenosis. The mean mitral valve  gradient is 6.0 mmHg with HR 81bpm.   5. The aortic valve is tricuspid. There is moderate calcification of the  aortic valve. There is moderate thickening of the aortic valve. Aortic  valve regurgitation is moderate by PHT but visually appears moderate to  severe. Mild aortic valve stenosis.  Aortic regurgitation PHT measures 326 msec. Aortic valve mean gradient  measures 20.0  mmHg. Aortic valve Vmax measures 2.92 m/s.   6. Aortic dilatation noted. There is mild dilatation of the ascending  aorta, measuring 41 mm.   7. The inferior vena cava is normal in size with greater than 50%  respiratory variability, suggesting right atrial pressure of 3 mmHg.   8. Compared to prior echo, the mean MVG of the MV bioprosthesis has  increased from 4 to 71mmHg but HR faster on this study. AI appears  to have  progressed. The AI is moderate by PHT but visually appears moderate to  severe. Consider TEE for further  quantification of AI.    Myoview 10/10/2019 Nuclear stress EF: 49%. The left ventricular ejection fraction is mildly decreased (45-54%). Defect 1: There is a small defect of mild severity present in the basal anterolateral, mid anterolateral and apical lateral location. This is an intermediate risk study.   1. Reversible basal to apical anterolateral perfusion defect consistent with ischemia. 2. Septal hypokinesis, which may be due to paced rhythm  EKG:  EKG is ordered today.  It shows atrial sensed, biventricular paced rhythm with positive R wave in lead V1.  Recent Labs: 03/15/2021: ALT 31; BNP 91.9; TSH 3.710 04/22/2021: BUN 75; Creatinine, Ser 3.55; Hemoglobin 15.8; Platelets 238; Potassium 3.5; Sodium 135  07/22/2020 creatinine 2.66, potassium 3.6, hemoglobin A1c 8.1%-  Lipid Panel    Component Value Date/Time   CHOL 108 (L) 03/10/2016 0925   TRIG 228 (H) 03/10/2016 0925   HDL 28 (L) 03/10/2016 0925   CHOLHDL 3.9 03/10/2016 0925   VLDL 46 (H) 03/10/2016 0925   LDLCALC 34 03/10/2016 0925    12/30/2018 Total cholesterol 143, triglycerides 252, HDL 35 LDL 67  Labs from [facility] 02/14/2021 BNP 887, hemoglobin 13.5, potassium 3.5, BUN 64, creatinine 2.65, magnesium 2.5     ASSESSMENT:    1. Nonrheumatic aortic insufficiency with aortic stenosis   2. Chronic diastolic heart failure ([place])   3. Chronic obstructive pulmonary disease, unspecified COPD type ([place])   4. Paroxysmal atrial fibrillation ([place])   5. Encounter for monitoring amiodarone therapy   6. Long term (current) use of anticoagulants   7. Coronary artery disease of native artery of native heart with stable angina pectoris ([place])   8. History of mitral valve replacement with bioprosthetic valve   9. Biventricular ICD (implantable cardioverter-defibrillator) - [facility]   10. CKD (chronic kidney disease)  stage 4, GFR 15-29 ml/min (HCC)   11. Essential hypertension   12. Diabetes mellitus type 2 in obese [facility])      PLAN:  In order of problems listed above:  CHF: Always a challenge to assess volume status due to his body habitus, but I guess he is euvolemic.  He does not have jugular venous distention or edema.  His lungs are clear, with severely diminished breath sounds consistent with emphysema.  His BNP was markedly elevated at 882 in September and in October was down to 91.9.  EF is normal and he has good biventricular pacing.   Unable to take RAAS inhibitors due to renal dysfunction.  Creatinine has worsened with diuresis.  He is not a candidate for SGLT2 inhibitors.  Since he has subjective improvement after taking Zaroxolyn will increase this to 3 times a week.  I think we will need to have a right heart catheterization to help distinguish whether his current problems with shortness of breath or due to lung disease or there is still a component of heart failure.  If we do a  right heart catheterization, it would also make sense to do left heart catheterization and coronary angiography with the smallest amount of contrast possible. AS/AI: He has significant aortic insufficiency.  This could definitely be contributing to his complaints.  He is not a candidate for surgical valve replacement due to high risk (previous sternotomy, severe COPD, severe CKD), but I believe the anatomical changes in his valve would allow TAVR.  Discussed this with him in some detail.  Reviewed the fact that preparing for TAVR would involve CT angiography with contrast and left heart catheterization with coronary angiography, both of which could hasten progression to ESRD. COPD: Unfortunately he continues to smoke.  He will always have residual dyspnea due to emphysema.  In the past, even when we achieved euvolemia (even hypovolemia confirmed at right heart catheterization) he still had residual dyspnea and PFTs confirm  significant obstructive lung disease.  This may be the situation now as well, but he does have orthopnea and has a new serious aortic valve problem.  We may not be able to settle the issue without another heart catheterization. Paroxysmal atrial fibrillation: This was likely the cause for his heart failure exacerbation episode in September.  He has not had any recurrent arrhythmia in the last 3 months on amiodarone and anticoagulation.  Unfortunately, his respiratory status has not returned to previous baseline.   Amiodarone: Although amiodarone is concerning due to his lung problems, there is no good alternative antiarrhythmic due to his renal dysfunction, heart failure and coronary issues.  Will need liver and thyroid function test every 6 months (due in April 2023). Anticoagulation: No bleeding complications to date.  He describes black stools, but these are formed, not liquid and are likely due to iron supplements. CAD: He does not have angina pectoris and had a low risk nuclear stress test a year ago.  If we decide to proceed with TAVR he would need to have angiography with the associated risk of worsening kidney function.  Small area of lateral wall ischemia, good response to enhanced antianginal therapy with adding long-acting nitrates.   Continue beta-blockers, long-acting nitrates, high-dose statin S/p MVR: Slight increase in transfer center gradients on his most recent studies is attributable to faster heart rate.  The prosthesis is very well imaged on TEE and appears morphologically normal.  Needs endocarditis prophylaxis with dental procedures. CRT-D: Check on his device today shows normal device function with 100% biventricular pacing and maintenance of sinus rhythm since his cardioversion in September.  Presenting rhythm today is normal sinus with biventricular pacing.  He lost BiV pacing during atrial fibrillation which I am sure contributed to heart failure exacerbation and may have been the  major cause of his heart failure exacerbation. CKD stage 4: Creatinine is now higher new baseline at 2.90-3.35 (GFR 18-23). We will likely accelerate progression to ESRD and hemodialysis with contrast based procedures such as CT angiography of the aorta and coronary angiography which would be necessary before TAVR. He already has a mature fistula that has not yet been used.  Nephrologist is Dr. [name].   HTN: Controlled DM: On glipizide monotherapy.  Controlled. HLP: Labs followed by PCP.  Target LDL less than 70. Peripheral venous insufficiency/lymphedema:  Prominent bilateral varicose veins.  This contributes to his edema, which is not entirely due to heart failure. Severe obesity: weight loss would be beneficial, but need to reestablish his dry weight.  Medication Adjustments/Labs and Tests Ordered: Current medicines are reviewed at length with the patient today.  Concerns regarding medicines are outlined above.  Medication changes, Labs and Tests ordered today are listed in the Patient Instructions below. Patient Instructions  Medication Instructions:  Metolazone: Take one tablet (2.5 mg) three times a week  *If you need a refill on your cardiac medications before your next appointment, please call your pharmacy*   Lab Work: None ordered If you have labs (blood work) drawn today and your tests are completely normal, you will receive your results only by: [place] (if you have MyChart) OR A paper copy in the mail If you have any lab test that is abnormal or we need to change your treatment, we will call you to review the results.   Testing/Procedures: None ordered   Follow-Up: At [facility], you and your health needs are our priority.  As part of our continuing mission to provide you with exceptional heart care, we have created designated Provider Care Teams.  These Care Teams include your primary Cardiologist (physician) and Advanced Practice Providers (APPs -   Physician Assistants and Nurse Practitioners) who all work together to provide you with the care you need, when you need it.  We recommend signing up for the patient portal called "MyChart".  Sign up information is provided on this After Visit Summary.  MyChart is used to connect with patients for Virtual Visits (Telemedicine).  Patients are able to view lab/test results, encounter notes, upcoming appointments, etc.  Non-urgent messages can be sent to your provider as well.   To learn more about what you can do with MyChart, go to [email or web address].    Your next appointment:   A referral has been placed to the Structural Team for an appointment Keep your follow up with Dr. Sallyanne Kuster in February    Mikael Spray, MD  05/17/2021 11:24 AM    [address], [place], [place]  [address] Phone: [phone]; Fax: [phone]

## 2021-05-25 ENCOUNTER — Ambulatory Visit: Payer: Medicare HMO | Admitting: Cardiovascular Disease

## 2021-05-25 ENCOUNTER — Other Ambulatory Visit: Payer: Self-pay

## 2021-05-25 ENCOUNTER — Encounter: Payer: Self-pay | Admitting: Cardiovascular Disease

## 2021-05-25 VITALS — BP 120/70 | HR 90 | Ht 71.5 in | Wt 252.0 lb

## 2021-05-25 DIAGNOSIS — I352 Nonrheumatic aortic (valve) stenosis with insufficiency: Secondary | ICD-10-CM | POA: Diagnosis not present

## 2021-05-25 NOTE — Patient Instructions (Signed)
Medication Instructions:  Your physician recommends that you continue on your current medications as directed. Please refer to the Current Medication list given to you today.   *If you need a refill on your cardiac medications before your next appointment, please call your pharmacy*   Lab Work: None If you have labs (blood work) drawn today and your tests are completely normal, you will receive your results only by: [place] (if you have MyChart) OR A paper copy in the mail If you have any lab test that is abnormal or we need to change your treatment, we will call you to review the results.   Testing/Procedures: None   Follow-Up: Keep appointment with Dr. Sallyanne Kuster in February   Other Instructions

## 2021-05-25 NOTE — Progress Notes (Signed)
Cardiology Office Note:    Date:  05/25/2021   ID:  Dominic [name], DOB 10-30-1950, MRN [id number]  PCP:  [name], FNP   [facility] Providers Cardiologist:  [name], MD     Referring MD: Luciana Axe*   Chief Complaint  Patient presents with   Shortness of Breath     History of Present Illness:    Dominic [name] is a 70 y.o. 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  The patient is here with his wife today.  He has an extremely complex cardiac and medical history.  He has a history of extensive coronary artery disease with multiple heart catheterizations and PCI procedures in the past.  He ultimately underwent biological mitral valve replacement and CABG at [facility] and 2015.  He later had drug-eluting stent implantation at [facility] in 2016. He states that at age 10 he nearly died of complications of cellulitis where he had cardiopulmonary arrest and was unable to work after that. He previously worked as a Forensic scientist.  More recently he has undergone CRT-D implantation in 2017.  He has developed stage IV chronic kidney disease.  Comorbid cardiac conditions include type 2 diabetes, paroxysmal atrial fibrillation, and congestive heart failure with leg swelling and orthopnea.  The patient has moderate to severe COPD and continues to smoke 1 pack/day.  He has been found to have both moderate aortic stenosis and at least moderate aortic insufficiency.  He was hospitalized in September 2022 with congestive heart failure in the setting of atrial fibrillation with RVR.  He is now on apixaban and amiodarone.  His chronic kidney disease is managed by nephrology, Dr. [name].  The patient has a right arm AV fistula that is functioning and he is prepared to go on hemodialysis if necessary.  However, he expresses today that he would like to avoid  hemodialysis as long as possible.  From a symptomatic perspective, he continues to struggle with fatigue, dyspnea, and orthopnea.  He has had no recent chest pain or pressure.  He denies recent heart palpitations, lightheadedness, or syncope.  Past Medical History:  Diagnosis Date   Biventricular ICD (implantable cardioverter-defibrillator) - [facility] 09/05/2015   Jul 01, 2015 [facility] Dynagen X4   CAD (coronary artery disease) 09/05/2015   CABG 2015 PCI-BMS ostial LCX Sept 2015 04/05/2015 cath 50% distal LM, 90% prox LAD, old stent prox LAD 70% ISR, patent LIMA-LAD, 80% prox RCA PCI-DES x2 RCA and LCX    Chronic systolic CHF (congestive heart failure) ([place]) 09/05/2015   a. [place] 10/29/15: PCW 8, CI 2.2. Overall low filling presssures. Mean PA 20; b. echo 10/30/15: EF 45-50%, not tech suff to allwo for LV diastolic fxn, mild AI, nl appearing MVR   CKD stage 4 due to type 2 diabetes mellitus ([place]) 09/05/2015   Diabetes mellitus, type 2 ([place]) 09/05/2015   Essential hypertension 09/05/2015   History of mitral valve replacement with bioprosthetic valve 09/05/2015   31 mm Edwards 2015, Dr. Jerelene Redden   Postoperative atrial fibrillation ([place]) 09/05/2015    Past Surgical History:  Procedure Laterality Date   CARDIAC CATHETERIZATION N/A 10/29/2015   Procedure: Right Heart Cath;  Surgeon: Jolaine Artist, MD;  Location: [facility];  Service: Cardiovascular;  Laterality: N/A;   CARDIAC VALVE REPLACEMENT  2015   4mm Edwards bioprosthesis   CORONARY ANGIOPLASTY     CORONARY ARTERY  BYPASS GRAFT  2015   [facility], Dr. Jerelene Redden.   TEE WITHOUT CARDIOVERSION N/A 04/26/2021   Procedure: TRANSESOPHAGEAL ECHOCARDIOGRAM (TEE);  Surgeon: [name], MD;  Location: [facility] ENDOSCOPY;  Service: Cardiovascular;  Laterality: N/A;    Current Medications: Current Meds  Medication Sig   albuterol (PROVENTIL HFA;VENTOLIN HFA) 108 (90 Base) MCG/ACT inhaler Inhale 2 puffs into the lungs every 6 (six) hours as needed for wheezing or  shortness of breath.   amiodarone (PACERONE) 200 MG tablet Take 200 mg by mouth 2 (two) times daily.   apixaban (ELIQUIS) 5 MG TABS tablet Take 5 mg by mouth 2 (two) times daily.   atorvastatin (LIPITOR) 40 MG tablet Take 40 mg by mouth daily.   B Complex-C (SUPER B COMPLEX PO) Take 1 capsule by mouth daily.   Cholecalciferol (VITAMIN D) 50 MCG (2000 UT) tablet Take 2,000 Units by mouth daily.   docusate sodium (COLACE) 100 MG capsule Take 100 mg by mouth 2 (two) times daily.   DULoxetine (CYMBALTA) 60 MG capsule Take 60 mg by mouth daily.    famotidine (PEPCID) 20 MG tablet Take 10 mg by mouth daily.   ferrous sulfate 325 (65 FE) MG tablet Take 325 mg by mouth daily with breakfast.   fluticasone (FLONASE) 50 MCG/ACT nasal spray Place 1 spray into both nostrils daily as needed for allergies.   fluticasone-salmeterol (ADVAIR HFA) 45-21 MCG/ACT inhaler Inhale 1 puff into the lungs 2 (two) times daily.   glipiZIDE (GLUCOTROL) 5 MG tablet Take 5 mg by mouth daily before breakfast.   glucose blood test strip 1 each by Other route as needed for other. Use as instructed   HYDROcodone-acetaminophen (NORCO/VICODIN) 5-325 MG tablet Take 1 tablet by mouth every 6 (six) hours as needed for moderate pain.   Insulin Pen Needle (PEN NEEDLES 3/16") 31G X 5 MM MISC Use with insulin pen daily   isosorbide mononitrate (IMDUR) 30 MG 24 hr tablet TAKE 1 TABLET BY MOUTH EVERY DAY   LANTUS SOLOSTAR 100 UNIT/ML Solostar Pen Inject 58 Units into the skin daily.   metolazone (ZAROXOLYN) 2.5 MG tablet Take 1 tablet (2.5 mg total) by mouth 3 (three) times a week.   mirtazapine (REMERON) 7.5 MG tablet Take 7.5 mg by mouth at bedtime.   nitroGLYCERIN (NITROSTAT) 0.4 MG SL tablet Place 0.4 mg under the tongue every 5 (five) minutes as needed for chest pain.   polyethylene glycol powder (GLYCOLAX/MIRALAX) 17 GM/SCOOP powder Take 1 Container by mouth daily.   potassium chloride SA (KLOR-CON) 20 MEQ tablet TAKE 2 TABLETS BY  MOUTH THREE TIMES A DAY   sucralfate (CARAFATE) 1 g tablet Take 1 g by mouth 2 (two) times daily.   TECHLITE PEN NEEDLES 31G X 5 MM MISC USE WITH INSULIN PEN DAILY   tiZANidine (ZANAFLEX) 2 MG tablet Take 2 mg by mouth 2 (two) times daily as needed for muscle spasms.   torsemide (DEMADEX) 20 MG tablet Take 20 mg by mouth daily. Per patient taking 6 tablets by mouth twice daily     Allergies:   Hydrochlorothiazide, Amoxicillin-pot clavulanate, Clindamycin, Lisinopril, Meloxicam, Topiramate, and Tamsulosin   Social History   Socioeconomic History   Marital status: Married    Spouse name: Not on file   Number of children: Not on file   Years of education: Not on file   Highest education level: Not on file  Occupational History   Not on file  Tobacco Use   Smoking status: Some Days  Packs/day: 0.50    Years: 40.00    Pack years: 20.00    Types: Cigarettes   Smokeless tobacco: Never  Substance and Sexual Activity   Alcohol use: No    Alcohol/week: 0.0 standard drinks   Drug use: No   Sexual activity: Not Currently  Other Topics Concern   Not on file  Social History Narrative   Not on file   Social Determinants of Health   Financial Resource Strain: Not on file  Food Insecurity: Not on file  Transportation Needs: Not on file  Physical Activity: Not on file  Stress: Not on file  Social Connections: Not on file     Family History: The patient's family history includes Hyperlipidemia in his father; Hypertension in his father.  ROS:   Please see the history of present illness.    All other systems reviewed and are negative.  EKGs/Labs/Other Studies Reviewed:    The following studies were reviewed today: Echo: 1. Left ventricular ejection fraction, by estimation, is 55 to 60%. The  left ventricle has normal function. The left ventricle has no regional  wall motion abnormalities. There is moderate concentric left ventricular  hypertrophy of the basal-septal   segment. Left ventricular diastolic function could not be evaluated.   2. Right ventricular systolic function is mildly reduced. The right  ventricular size is normal. There is normal pulmonary artery systolic  pressure. The estimated right ventricular systolic pressure is 31.5 mmHg.   3. Left atrial size was mildly dilated.   4. The mitral valve has been repaired/replaced. Mild mitral valve  regurgitation. No evidence of mitral stenosis. The mean mitral valve  gradient is 6.0 mmHg with HR 81bpm.   5. The aortic valve is tricuspid. There is moderate calcification of the  aortic valve. There is moderate thickening of the aortic valve. Aortic  valve regurgitation is moderate by PHT but visually appears moderate to  severe. Mild aortic valve stenosis.  Aortic regurgitation PHT measures 326 msec. Aortic valve mean gradient  measures 20.0 mmHg. Aortic valve Vmax measures 2.92 m/s.   6. Aortic dilatation noted. There is mild dilatation of the ascending  aorta, measuring 41 mm.   7. The inferior vena cava is normal in size with greater than 50%  respiratory variability, suggesting right atrial pressure of 3 mmHg.   8. Compared to prior echo, the mean MVG of the MV bioprosthesis has  increased from 4 to 40mmHg but HR faster on this study. AI appears to have  progressed. The AI is moderate by PHT but visually appears moderate to  severe. Consider TEE for further  quantification of AI.   FINDINGS   Left Ventricle: Left ventricular ejection fraction, by estimation, is 55  to 60%. The left ventricle has normal function. The left ventricle has no  regional wall motion abnormalities. 3D left ventricular ejection fraction  analysis performed but not  reported based on interpreter judgement due to suboptimal quality. The  left ventricular internal cavity size was normal in size. There is  moderate concentric left ventricular hypertrophy of the basal-septal  segment. Left ventricular diastolic  function  could not be evaluated due to mitral valve replacement. Left ventricular  diastolic function could not be evaluated.   Right Ventricle: The right ventricular size is normal. No increase in  right ventricular wall thickness. Right ventricular systolic function is  mildly reduced. There is normal pulmonary artery systolic pressure. The  tricuspid regurgitant velocity is 2.86  m/s, and with an assumed  right atrial pressure of 3 mmHg, the estimated  right ventricular systolic pressure is 62.8 mmHg.   Left Atrium: Left atrial size was mildly dilated.   Right Atrium: Right atrial size was normal in size.   Pericardium: There is no evidence of pericardial effusion.   Mitral Valve: The mitral valve has been repaired/replaced. Mild mitral  valve regurgitation. There is a 31 mm bioprosthetic valve present in the  mitral position. No evidence of mitral valve stenosis. MV peak gradient,  13.7 mmHg. The mean mitral valve  gradient is 6.0 mmHg.   Tricuspid Valve: The tricuspid valve is normal in structure. Tricuspid  valve regurgitation is mild . No evidence of tricuspid stenosis.   Aortic Valve: The aortic valve is tricuspid. There is moderate  calcification of the aortic valve. There is moderate thickening of the  aortic valve. Aortic valve regurgitation is moderate. Aortic regurgitation  PHT measures 326 msec. Mild aortic stenosis  is present. Aortic valve mean gradient measures 20.0 mmHg. Aortic valve  peak gradient measures 34.1 mmHg. Aortic valve area, by VTI measures 2.42  cm.   Pulmonic Valve: The pulmonic valve was normal in structure. Pulmonic valve  regurgitation is not visualized. No evidence of pulmonic stenosis.   Aorta: Aortic dilatation noted. There is mild dilatation of the ascending  aorta, measuring 41 mm.   Venous: The inferior vena cava is normal in size with greater than 50%  respiratory variability, suggesting right atrial pressure of 3 mmHg.    IAS/Shunts: The interatrial septum appears to be lipomatous. No atrial  level shunt detected by color flow Doppler.      LEFT VENTRICLE  PLAX 2D  LVIDd:         4.90 cm   Diastology  LVIDs:         3.70 cm   LV e' medial:    5.87 cm/s  LV PW:         1.60 cm   LV E/e' medial:  25.5  LV IVS:        1.30 cm   LV e' lateral:   5.33 cm/s  LVOT diam:     2.80 cm   LV E/e' lateral: 28.0  LV SV:         132  LV SV Index:   57  LVOT Area:     6.16 cm                              3D Volume EF:                           3D EF:        46 %                           LV EDV:       214 ml                           LV ESV:       115 ml                           LV SV:        99 ml   RIGHT VENTRICLE  RV S prime:     11.15 cm/s  TAPSE (  M-mode): 1.5 cm   LEFT ATRIUM             Index        RIGHT ATRIUM           Index  LA diam:        5.90 cm 2.53 cm/m   RA Area:     19.80 cm  LA Vol (A2C):   82.8 ml 35.53 ml/m  RA Volume:   56.10 ml  24.08 ml/m  LA Vol (A4C):   78.3 ml 33.60 ml/m  LA Biplane Vol: 86.1 ml 36.95 ml/m   AORTIC VALVE  AV Area (Vmax):    2.59 cm  AV Area (Vmean):   2.24 cm  AV Area (VTI):     2.42 cm  AV Vmax:           292.00 cm/s  AV Vmean:          212.000 cm/s  AV VTI:            0.546 m  AV Peak Grad:      34.1 mmHg  AV Mean Grad:      20.0 mmHg  LVOT Vmax:         123.00 cm/s  LVOT Vmean:        77.250 cm/s  LVOT VTI:          0.215 m  LVOT/AV VTI ratio: 0.39  AI PHT:            326 msec     AORTA  Ao Root diam: 3.50 cm  Ao Asc diam:  4.25 cm   MITRAL VALVE                TRICUSPID VALVE  MV Area (PHT): cm          TR Peak grad:   32.7 mmHg  MV Area VTI:   1.83 cm     TR Vmax:        286.00 cm/s  MV Peak grad:  13.7 mmHg  MV Mean grad:  6.0 mmHg     SHUNTS  MV Vmax:       1.85 m/s     Systemic VTI:  0.22 m  MV Vmean:      111.0 cm/s   Systemic Diam: 2.80 cm  MV Decel Time: 303 msec  MV E velocity: 149.50 cm/s  MV A velocity: 141.00 cm/s  MV  E/A ratio:  1.06   TEE:  1. Left ventricular ejection fraction, by estimation, is 55 to 60%. The  left ventricle has normal function. There is moderate left ventricular  hypertrophy.   2. Right ventricular systolic function is mildly reduced. The right  ventricular size is normal.   3. Left atrial size was mildly dilated. No left atrial/left atrial  appendage thrombus was detected.   4. 2D/3D visualization. The mitral valve has been repaired/replaced.  Trivial mitral valve regurgitation. No evidence of mitral stenosis. The  mean mitral valve gradient is 4.0 mmHg with average heart rate of 87 bpm.  There is a 31 mm Edwards bioprosthetic   valve present in the mitral position. Procedure Date: 06/12/2013. Echo  findings are consistent with normal structure and function of the mitral  valve prosthesis.   5. The tricuspid valve is abnormal. Tricuspid valve regurgitation is mild  to moderate.   6. The aortic valve is tricuspid. There is mild calcification of the  aortic valve. There is moderate thickening of the aortic valve.  Aortic  valve regurgitation is moderate. Mild aortic valve stenosis. Aortic  regurgitation PHT measures 359 msec.   Comparison(s): 03/17/2021: LVEF 55-60%, MV gradient 6 mmHg, moderate to  severe AI, mild AS.   Recent Labs: 03/15/2021: ALT 31; BNP 91.9; TSH 3.710 04/22/2021: BUN 75; Creatinine, Ser 3.55; Hemoglobin 15.8; Platelets 238; Potassium 3.5; Sodium 135  Recent Lipid Panel    Component Value Date/Time   CHOL 108 (L) 03/10/2016 0925   TRIG 228 (H) 03/10/2016 0925   HDL 28 (L) 03/10/2016 0925   CHOLHDL 3.9 03/10/2016 0925   VLDL 46 (H) 03/10/2016 0925   LDLCALC 34 03/10/2016 0925                Physical Exam:    VS:  BP 120/70    Pulse 90    Ht 5' 11.5" (1.816 m)    Wt 252 lb (114.3 kg)    SpO2 95%    BMI 34.66 kg/m     Wt Readings from Last 3 Encounters:  05/25/21 252 lb (114.3 kg)  05/17/21 256 lb 6.4 oz (116.3 kg)  04/26/21 249 lb (112.9  kg)     GEN: Pleasant, obese, chronically ill-appearing male in no acute distress HEENT: Normal NECK: No JVD; No carotid bruits LYMPHATICS: No lymphadenopathy CARDIAC: VQM,0/8 systolic murmur best heart at the LLSB RESPIRATORY:  Clear to auscultation without rales, wheezing or rhonchi  ABDOMEN: Soft, non-tender, non-distended MUSCULOSKELETAL:  trace bilateral pretibial edema; No deformity  SKIN: Warm and dry NEUROLOGIC:  Alert and oriented x 3 PSYCHIATRIC:  Normal affect   ASSESSMENT:    1. Nonrheumatic aortic insufficiency with aortic stenosis    PLAN:    In order of problems listed above:  The patient has New York Heart Association functional class III limitation of fatigue and exertional dyspnea in the context of obesity, heavy tobacco use, chronic diastolic heart failure, ischemic heart disease, and valvular heart disease.  I have reviewed his echo and TEE images from his most recent studies and compared them to his previous studies.  He appears to have mild to moderate aortic stenosis with mild leaflet restriction and thickening.  Peak and mean transvalvular gradients are 34 and 20 mmHg, respectively.  The patient has a least moderate aortic insufficiency and may have severe aortic insufficiency.  His pressure half-time measures between 303 and 326 ms.  The patient's biological mitral valve prosthesis appears to be functioning normally with no mitral regurgitation and a mean transmitral gradient of 4 mmHg.  We discussed potential treatment options in the context of his multiple comorbidities with specific attention to his stage IV chronic kidney disease and risk for progressing to end-stage renal disease.  Management strategies include ongoing clinical surveillance, transcatheter aortic valve replacement, and high risk redo cardiac surgery.  I am certain he would not be a candidate for redo surgery under any circumstances because of his severe comorbidities and poor functional capacity.   I discussed his case with his primary cardiologist, Dr. Sallyanne Kuster.  We agreed that it would be reasonable to proceed with a limited contrast coronary angiogram and hemodynamic assessment to determine the impact of his aortic valve disease on chronic dyspnea and assess right and left heart hemodynamics.  Would probably defer CT angiography studies unless his cardiac catheterization suggested high filling pressures and significant/severe aortic insufficiency.  After discussion of potential risks, indications, and alternatives with the patient, he prefers watchful waiting, ongoing medical therapy, and clinical surveillance.  He does not want to take  any chances at this time with his kidney dysfunction.  He understands that if his symptoms worsen, I would be happy to reevaluate him in the future.  He is counseled today regarding the natural history of aortic valve disease, implications of ongoing tobacco use, importance of medical therapy and close follow-up with his cardiologist and nephrologist.     High complexity medical decision making is required for this consultation in this patient with multiple severe comorbid conditions with discussion of treatment options for valvular heart disease, potential implications regarding his quality of life, mortality, and effects on renal function.  Primary data is reviewed with review of his hospital records and echo/TEE images.    Medication Adjustments/Labs and Tests Ordered: Current medicines are reviewed at length with the patient today.  Concerns regarding medicines are outlined above.  No orders of the defined types were placed in this encounter.  No orders of the defined types were placed in this encounter.   Patient Instructions  Medication Instructions:  Your physician recommends that you continue on your current medications as directed. Please refer to the Current Medication list given to you today.   *If you need a refill on your cardiac medications  before your next appointment, please call your pharmacy*   Lab Work: None If you have labs (blood work) drawn today and your tests are completely normal, you will receive your results only by: [place] (if you have MyChart) OR A paper copy in the mail If you have any lab test that is abnormal or we need to change your treatment, we will call you to review the results.   Testing/Procedures: None   Follow-Up: Keep appointment with Dr. Sallyanne Kuster in February   Other Instructions     Signed, Sherren Mocha, MD  05/25/2021 12:32 PM    [place]

## 2021-05-25 NOTE — Progress Notes (Signed)
Pre Surgical Assessment: 5 M Walk Test  84M=16.73ft  5 Meter Walk Test- trial 1: 7.71 seconds 5 Meter Walk Test- trial 2: 8.13 seconds 5 Meter Walk Test- trial 3: 7.72 seconds 5 Meter Walk Test Average: 7.85 seconds

## 2021-06-16 ENCOUNTER — Telehealth: Payer: Self-pay | Admitting: Cardiovascular Disease

## 2021-06-16 ENCOUNTER — Other Ambulatory Visit: Payer: Self-pay

## 2021-06-16 MED ORDER — AMIODARONE HCL 200 MG PO TABS: 200.0000 mg | ORAL_TABLET | Freq: Two times a day (BID) | ORAL | 3 refills | Status: DC

## 2021-06-16 MED ORDER — APIXABAN 5 MG PO TABS: 5.0000 mg | ORAL_TABLET | Freq: Two times a day (BID) | ORAL | 5 refills | Status: DC

## 2021-06-16 NOTE — Telephone Encounter (Signed)
Prescription refill request for Eliquis received. Indication:Afib Last office visit:12/22 Scr:3.5 Age: 71 Weight:114.3 kg  Prescription refilled

## 2021-06-16 NOTE — Telephone Encounter (Signed)
°*  STAT* If patient is at the pharmacy, call can be transferred to refill team.   1. Which medications need to be refilled? (please list name of each medication and dose if known) need new prescriptions for Eliquis and  Amiodarone  2. Which pharmacy/location (including street and city if local pharmacy) is medication to be sent to? [facility], [place]  3. Do they need a 30 day or 90 day supply? 90 days and refills

## 2021-07-18 ENCOUNTER — Ambulatory Visit (INDEPENDENT_AMBULATORY_CARE_PROVIDER_SITE_OTHER): Payer: Medicare Other | Admitting: Cardiovascular Disease

## 2021-07-18 ENCOUNTER — Encounter: Payer: Medicare HMO | Admitting: Cardiovascular Disease

## 2021-07-18 ENCOUNTER — Encounter: Payer: Self-pay | Admitting: Cardiovascular Disease

## 2021-07-18 ENCOUNTER — Other Ambulatory Visit: Payer: Self-pay

## 2021-07-18 VITALS — BP 122/62 | HR 85 | Ht 71.0 in | Wt 262.2 lb

## 2021-07-18 DIAGNOSIS — Z953 Presence of xenogenic heart valve: Secondary | ICD-10-CM

## 2021-07-18 DIAGNOSIS — D6869 Other thrombophilia: Secondary | ICD-10-CM

## 2021-07-18 DIAGNOSIS — I352 Nonrheumatic aortic (valve) stenosis with insufficiency: Secondary | ICD-10-CM

## 2021-07-18 DIAGNOSIS — I5032 Chronic diastolic (congestive) heart failure: Secondary | ICD-10-CM | POA: Diagnosis not present

## 2021-07-18 DIAGNOSIS — I480 Paroxysmal atrial fibrillation: Secondary | ICD-10-CM

## 2021-07-18 DIAGNOSIS — I25118 Atherosclerotic heart disease of native coronary artery with other forms of angina pectoris: Secondary | ICD-10-CM

## 2021-07-18 DIAGNOSIS — I10 Essential (primary) hypertension: Secondary | ICD-10-CM

## 2021-07-18 DIAGNOSIS — Z5181 Encounter for therapeutic drug level monitoring: Secondary | ICD-10-CM

## 2021-07-18 DIAGNOSIS — Z79899 Other long term (current) drug therapy: Secondary | ICD-10-CM

## 2021-07-18 DIAGNOSIS — J449 Chronic obstructive pulmonary disease, unspecified: Secondary | ICD-10-CM | POA: Diagnosis not present

## 2021-07-18 DIAGNOSIS — Z95810 Presence of automatic (implantable) cardiac defibrillator: Secondary | ICD-10-CM

## 2021-07-18 DIAGNOSIS — E669 Obesity, unspecified: Secondary | ICD-10-CM

## 2021-07-18 DIAGNOSIS — E1169 Type 2 diabetes mellitus with other specified complication: Secondary | ICD-10-CM

## 2021-07-18 DIAGNOSIS — N184 Chronic kidney disease, stage 4 (severe): Secondary | ICD-10-CM

## 2021-07-18 DIAGNOSIS — I872 Venous insufficiency (chronic) (peripheral): Secondary | ICD-10-CM

## 2021-07-18 MED ORDER — METOLAZONE 2.5 MG PO TABS: ORAL_TABLET | ORAL | 6 refills | Status: DC

## 2021-07-18 NOTE — Progress Notes (Signed)
`   Cardiology Office Note    Date:  07/20/2021   ID:  Dominic [name], DOB 04-Aug-1950, MRN [id number]  PCP:  [name], FNP  Cardiologist:   [name], MD   Chief Complaint  Patient presents with   Congestive Heart Failure    History of Present Illness:  Dominic [name] is a 71 y.o. male with combined systolic and diastolic heart failure due to ischemic cardiomyopathy, s/p bioprosthetic MVR, s/p CRT-D (dual chamber CRT-D [facility] Dynagen X4 device, implanted in 2017.  LV pacing vector is LV ring 3 to can), moderate aortic insufficiency, moderate to severe COPD, moderate to severe chronic kidney disease, type 2 diabetes mellitus complicated by nephropathy and neuropathy, history of postoperative atrial fibrillation (that has not been subsequently seen on his defibrillator checks), recent episode of CHF exacerbation and AFib.  Although he continues to have issues of shortness of breath, he feels better than at his last appointment.  NYHA functional class III most days.  When he becomes short of breath, his inhalers help.  He has not had leg edema.  He denies orthopnea (chronically sleeps on 2-3 pillows).  Has not had PND.  Denies angina pectoris.  On his home scale his weight is stable at about 255 pounds (today he weighed 256.4 pounds which is roughly 6 pounds less than our office scale).  He has occasional problems with dizziness which tend to be worst on Fridays.  He takes metolazone 3 days a week, Monday Wednesday and Friday.  He has not had full-blown syncope or any falls, but has staggered against the wall.  At his most recent appointment with Dr. [name] on January 26, his renal function was slightly better with a creatinine of 3.73 (GFR 17).  Potassium was normal at 3.8.  Hemoglobin was 14.9.  He already has an antecubital AV fistula for future dialysis.    Presenting rhythm today is atrial sensed, biventricular paced and interrogation of his defibrillator shows 100%  biventricular pacing.  He has not had any atrial fibrillation in the last several months.  He continues to smoke about a pack of cigarettes daily.  His TEE on 04/26/2021 was interpreted as showing moderate AI. Transvalvular gradients were not reported, but the mean gradient was 20 mm Hg on TTE in October. The AI PHT was 350 ms (comparable to 326 ms on TTE). LVOT diameter on the TTE was overestimated at 2.8 cm. DVI was 0.39  - It is very challenging to measure his LVOT accurately due to shadowing from the mitral prosthetic annulus.  I think the LVOT actually measures around 2.3 cm, adjusting for this the actual calculated aortic valve area would be approximately 1.6 cm, consistent with mild-moderate aortic stenosis and moderate to severe AI.  Morphologically, he has a 3 leaflet aortic valve with moderate thickening and moderate calcification.  The noncoronary cusp on the left coronary cusp look relatively restrictive and motion.  I think there is enough degenerative change that his aortic valve could hold a TAVR stent.    For the first time in years he required hospitalization for congestive heart failure on 02/14/2021.  On presentation he was found to be in atrial fibrillation with rapid ventricular response.  He reports beginning to feel unwell 1 or 2 days before his hospitalization, with worsening exertional dyspnea.  He was not aware of any palpitations, but on arrival to the emergency room he had atrial fibrillation with RVR that did not respond to treatment with intravenous amiodarone  and metoprolol so that he was eventually cardioverted with a single shock at 200 J on the day of admission.  He was started on Eliquis.  The notes from [facility] state that he was evaluated by nephrology while in the hospital and that his renal function was stable.  He has not had angina pectoris since the addition of isosorbide mononitrate.  Nuclear stress test in April 2021 showed a small area of lateral wall ischemia.   Noninvasive/conservative approach recommended due to his renal failure.  His last coronary angiogram was performed in 2016 when he received 2 stents.  Mr. [name] had severe ischemic cardiomyopathy with left ventricular ejection fraction estimated to be approximately 30% by echocardiogram performed in October 2016. That echo describes septal and apical akinesis with global hypokinesis. He had evidence of restrictive filling as well as moderate right ventricular systolic dysfunction and moderate aortic insufficiency. Echo in May 2017 (after PCI and CRT) shows improved LVEF 45-50% and normal function of the mitral valve prosthesis. Right heart catheterization in May 2017 showed right atrial pressure 8, mean wedge pressure 6, cardiac index 2.2 L/minute.   He has an extensive history of coronary artery disease. He tells me that he thinks he has had 9 cardiac catheterizations and 5 stents. At least on one admission he had 2 episodes of cardiac arrest. I think this was during his hospitalization for bypass surgery/valve replacement. He underwent bypass surgery in [place] in 2015 with synchronous mitral valve replacement with a biological prosthesis. He has a history of previous stent placed in the LAD artery, type of stent and date of procedure unknown. He underwent placement of a bare metal stent to the ostium of the left circumflex coronary artery in September 2015. He subsequently underwent placement of 2 drug-eluting stents at [facility] in October 2016, placed in the right coronary artery and left circumflex coronary artery respectively. At the time of that cardiac catheterization he had 50% stenosis in the distal left main, 90% proximal LAD, 70% in-stent restenosis proximal LAD, patent LIMA to distal LAD, 80% RCA. No mention is made of his second bypass graft, which I presume is occluded. He is on combination aspirin/clopidogrel.  At the time of his bypass surgery received a 31 mm Edwards [employer] mitral bioprosthetic valve. By echocardiogram last October 2016 the mean transvalvular gradient was 5 mmHg. The same echocardiogram described a trileaflet unrestricted aortic valve with moderate insufficiency.  His chart describes a history of postoperative atrial fibrillation. Amiodarone was stopped after several years without Afib, but this recurred in September 2022. Amiodarone and anticoagulation were restarted.  He has a history of left bundle branch block and he received a CRT-D [employer] device on 07/01/2015. The device and the leads are MRI conditional.  He has advanced chronic kidney disease. With recent baseline creatinine around 3.0-3.5. During his hospitalization in January 2017 the peak creatinine reached 4.6 (acute diarrheal illness), reaching a similar level in August 2017 when he had nausea and vomiting.[name] He has type 2 diabetes mellitus (not requiring insulin), complicated by kidney disease and neuropathy.  He takes atorvastatin in a relatively low dose. He has long-standing treated hypertension. He is a former smoker and has a history of COPD and takes bronchodilators. He has a history of migraine headaches treated with Fioricet. He underwent sigmoid colectomy in November 2017 for colon cancer.  Past Medical History:  Diagnosis Date   Biventricular ICD (implantable cardioverter-defibrillator) - [facility] 09/05/2015   Jul 01, 2015 [facility]  Dynagen X4   CAD (coronary artery disease) 09/05/2015   CABG 2015 PCI-BMS ostial LCX Sept 2015 04/05/2015 cath 50% distal LM, 90% prox LAD, old stent prox LAD 70% ISR, patent LIMA-LAD, 80% prox RCA PCI-DES x2 RCA and LCX    Chronic systolic CHF (congestive heart failure) ([place]) 09/05/2015   a. [place] 10/29/15: PCW 8, CI 2.2. Overall low filling presssures. Mean PA 20; b. echo 10/30/15: EF 45-50%, not tech suff to allwo for LV diastolic fxn, mild AI, nl appearing MVR   CKD stage 4 due to type 2 diabetes mellitus ([place]) 09/05/2015   Diabetes mellitus,  type 2 ([place]) 09/05/2015   Essential hypertension 09/05/2015   History of mitral valve replacement with bioprosthetic valve 09/05/2015   31 mm Edwards 2015, Dr. Jerelene Redden   Postoperative atrial fibrillation ([place]) 09/05/2015    Past Surgical History:  Procedure Laterality Date   CARDIAC CATHETERIZATION N/A 10/29/2015   Procedure: Right Heart Cath;  Surgeon: Jolaine Artist, MD;  Location: [facility];  Service: Cardiovascular;  Laterality: N/A;   CARDIAC VALVE REPLACEMENT  2015   7mm Edwards bioprosthesis   CORONARY ANGIOPLASTY     CORONARY ARTERY BYPASS GRAFT  2015   [facility], Dr. Jerelene Redden.   TEE WITHOUT CARDIOVERSION N/A 04/26/2021   Procedure: TRANSESOPHAGEAL ECHOCARDIOGRAM (TEE);  Surgeon: [name], MD;  Location[facility] ENDOSCOPY;  Service: Cardiovascular;  Laterality: N/A;    Current Medications: Outpatient Medications Prior to Visit  Medication Sig Dispense Refill   albuterol (PROVENTIL HFA;VENTOLIN HFA) 108 (90 Base) MCG/ACT inhaler Inhale 2 puffs into the lungs every 6 (six) hours as needed for wheezing or shortness of breath.     amiodarone (PACERONE) 200 MG tablet Take 1 tablet (200 mg total) by mouth 2 (two) times daily. 90 tablet 3   apixaban (ELIQUIS) 5 MG TABS tablet Take 1 tablet (5 mg total) by mouth 2 (two) times daily. 60 tablet 5   atorvastatin (LIPITOR) 40 MG tablet Take 40 mg by mouth daily.     B Complex-C (SUPER B COMPLEX PO) Take 1 capsule by mouth daily.     Cholecalciferol (VITAMIN D) 50 MCG (2000 UT) tablet Take 2,000 Units by mouth daily.     docusate sodium (COLACE) 100 MG capsule Take 100 mg by mouth 2 (two) times daily.     DULoxetine (CYMBALTA) 60 MG capsule Take 60 mg by mouth daily.      famotidine (PEPCID) 20 MG tablet Take 10 mg by mouth daily.     ferrous sulfate 325 (65 FE) MG tablet Take 325 mg by mouth daily with breakfast.     fluticasone (FLONASE) 50 MCG/ACT nasal spray Place 1 spray into both nostrils daily as needed for allergies.      fluticasone-salmeterol (ADVAIR HFA) 45-21 MCG/ACT inhaler Inhale 1 puff into the lungs 2 (two) times daily.     glipiZIDE (GLUCOTROL) 5 MG tablet Take 5 mg by mouth daily before breakfast.     glucose blood test strip 1 each by Other route as needed for other. Use as instructed     Insulin Pen Needle (PEN NEEDLES 3/16") 31G X 5 MM MISC Use with insulin pen daily     isosorbide mononitrate (IMDUR) 30 MG 24 hr tablet TAKE 1 TABLET BY MOUTH EVERY DAY 90 tablet 2   LANTUS SOLOSTAR 100 UNIT/ML Solostar Pen Inject 58 Units into the skin daily.     mirtazapine (REMERON) 7.5 MG tablet Take 7.5 mg by mouth at bedtime.  polyethylene glycol powder (GLYCOLAX/MIRALAX) 17 GM/SCOOP powder Take 1 Container by mouth daily.     potassium chloride SA (KLOR-CON) 20 MEQ tablet TAKE 2 TABLETS BY MOUTH THREE TIMES A DAY 180 tablet 5   sucralfate (CARAFATE) 1 g tablet Take 1 g by mouth 2 (two) times daily.     TECHLITE PEN NEEDLES 31G X 5 MM MISC USE WITH INSULIN PEN DAILY     tiZANidine (ZANAFLEX) 2 MG tablet Take 2 mg by mouth 2 (two) times daily as needed for muscle spasms.     torsemide (DEMADEX) 20 MG tablet Take 20 mg by mouth daily. Per patient taking 6 tablets by mouth twice daily     metolazone (ZAROXOLYN) 2.5 MG tablet Take 1 tablet (2.5 mg total) by mouth 3 (three) times a week. 12 tablet 11   HYDROcodone-acetaminophen (NORCO/VICODIN) 5-325 MG tablet Take 1 tablet by mouth every 6 (six) hours as needed for moderate pain. (Patient not taking: Reported on 07/18/2021)     hydrocortisone cream 1 % Apply 1 application topically daily as needed for itching. (Patient not taking: Reported on 05/25/2021)     nitroGLYCERIN (NITROSTAT) 0.4 MG SL tablet Place 0.4 mg under the tongue every 5 (five) minutes as needed for chest pain. (Patient not taking: Reported on 07/18/2021)     torsemide (DEMADEX) 20 MG tablet TAKE 4 TABLETS (80 MG) BY MOUTH TWICE DAILY (Patient not taking: Reported on 05/25/2021) 240 tablet 3   No  facility-administered medications prior to visit.     Allergies:   Hydrochlorothiazide, Amoxicillin-pot clavulanate, Clindamycin, Lisinopril, Meloxicam, Topiramate, and Tamsulosin   Social History   Socioeconomic History   Marital status: Married    Spouse name: Not on file   Number of children: Not on file   Years of education: Not on file   Highest education level: Not on file  Occupational History   Not on file  Tobacco Use   Smoking status: Some Days    Packs/day: 0.50    Years: 40.00    Pack years: 20.00    Types: Cigarettes   Smokeless tobacco: Never  Substance and Sexual Activity   Alcohol use: No    Alcohol/week: 0.0 standard drinks   Drug use: No   Sexual activity: Not Currently  Other Topics Concern   Not on file  Social History Narrative   Not on file   Social Determinants of Health   Financial Resource Strain: Not on file  Food Insecurity: Not on file  Transportation Needs: Not on file  Physical Activity: Not on file  Stress: Not on file  Social Connections: Not on file     Family History:  The patient's family history includes Hyperlipidemia in his father; Hypertension in his father.   ROS:   Please see the history of present illness.    ROS All other systems reviewed and are negative.   PHYSICAL EXAM:   VS:  BP 122/62 (BP Location: Left Arm, Patient Position: Sitting, Cuff Size: Large)    Pulse 85    Ht 5\' 11"  (1.803 m)    Wt 262 lb 3.2 oz (118.9 kg)    SpO2 96%    BMI 36.57 kg/m      General: Alert, oriented x3, no distress, healthy left subclavian ICD site Head: no evidence of trauma, PERRL, EOMI, no exophtalmos or lid lag, no myxedema, no xanthelasma; normal ears, nose and oropharynx Neck: normal jugular venous pulsations and no hepatojugular reflux; brisk carotid pulses without delay and  no carotid bruits Chest: clear to auscultation, no signs of consolidation by percussion or palpation, normal fremitus, symmetrical and full respiratory  excursions Cardiovascular: normal position and quality of the apical impulse, regular rhythm, normal first and second heart sounds, grade 1/6 aortic ejection murmur early peaking and grade 1/6 decrescendo aortic insufficiency murmur at the left lower sternal border, no rubs or gallops Abdomen: no tenderness or distention, no masses by palpation, no abnormal pulsatility or arterial bruits, normal bowel sounds, no hepatosplenomegaly Extremities: no clubbing, cyanosis or edema; 2+ radial, ulnar and brachial pulses bilaterally; 2+ right femoral, posterior tibial and dorsalis pedis pulses; 2+ left femoral, posterior tibial and dorsalis pedis pulses; no subclavian or femoral bruits Neurological: grossly nonfocal Psych: Normal mood and affect   Wt Readings from Last 3 Encounters:  07/18/21 262 lb 3.2 oz (118.9 kg)  05/25/21 252 lb (114.3 kg)  05/17/21 256 lb 6.4 oz (116.3 kg)    Studies/Labs Reviewed:   TEE 04/26/2021  1. Left ventricular ejection fraction, by estimation, is 55 to 60%. The left ventricle has normal function. There is moderate left ventricular hypertrophy.   2. Right ventricular systolic function is mildly reduced. The right ventricular size is normal.   3. Left atrial size was mildly dilated. No left atrial/left atrial  appendage thrombus was detected.   4. 2D/3D visualization. The mitral valve has been repaired/replaced. Trivial mitral valve regurgitation. No evidence of mitral stenosis. The mean mitral valve gradient is 4.0 mmHg with average heart rate of 87 bpm.  There is a 31 mm Edwards bioprosthetic   valve present in the mitral position. Procedure Date: 06/12/2013.  Echo findings are consistent with normal structure and function of the mitral valve prosthesis.   5. The tricuspid valve is abnormal. Tricuspid valve regurgitation is mild to moderate.   6. The aortic valve is tricuspid. There is mild calcification of the aortic valve. There is moderate thickening of the aortic  valve. Aortic valve regurgitation is moderate. Mild aortic valve stenosis. Aortic regurgitation PHT measures 359 msec.   Comparison(s): 03/17/2021: LVEF 55-60%, MV gradient 6 mmHg, moderate to severe AI, mild AS.   Echo 03/17/2021   1. Left ventricular ejection fraction, by estimation, is 55 to 60%. The  left ventricle has normal function. The left ventricle has no regional  wall motion abnormalities. There is moderate concentric left ventricular  hypertrophy of the basal-septal  segment. Left ventricular diastolic function could not be evaluated.   2. Right ventricular systolic function is mildly reduced. The right  ventricular size is normal. There is normal pulmonary artery systolic  pressure. The estimated right ventricular systolic pressure is 12.8 mmHg.   3. Left atrial size was mildly dilated.   4. The mitral valve has been repaired/replaced. Mild mitral valve  regurgitation. No evidence of mitral stenosis. The mean mitral valve  gradient is 6.0 mmHg with HR 81bpm.   5. The aortic valve is tricuspid. There is moderate calcification of the  aortic valve. There is moderate thickening of the aortic valve. Aortic  valve regurgitation is moderate by PHT but visually appears moderate to  severe. Mild aortic valve stenosis.  Aortic regurgitation PHT measures 326 msec. Aortic valve mean gradient  measures 20.0 mmHg. Aortic valve Vmax measures 2.92 m/s.   6. Aortic dilatation noted. There is mild dilatation of the ascending  aorta, measuring 41 mm.   7. The inferior vena cava is normal in size with greater than 50%  respiratory variability, suggesting right atrial pressure of  3 mmHg.   8. Compared to prior echo, the mean MVG of the MV bioprosthesis has  increased from 4 to 34mmHg but HR faster on this study. AI appears to have  progressed. The AI is moderate by PHT but visually appears moderate to  severe. Consider TEE for further  quantification of AI.    Myoview 10/10/2019 Nuclear  stress EF: 49%. The left ventricular ejection fraction is mildly decreased (45-54%). Defect 1: There is a small defect of mild severity present in the basal anterolateral, mid anterolateral and apical lateral location. This is an intermediate risk study.   1. Reversible basal to apical anterolateral perfusion defect consistent with ischemia. 2. Septal hypokinesis, which may be due to paced rhythm  EKG:  EKG is ordered today.  It shows atrial sensed, biventricular paced rhythm with positive R wave in lead V1.  Recent Labs: 03/15/2021: ALT 31; BNP 91.9; TSH 3.710 04/22/2021: BUN 75; Creatinine, Ser 3.55; Hemoglobin 15.8; Platelets 238; Potassium 3.5; Sodium 135  07/22/2020 creatinine 2.66, potassium 3.6, hemoglobin A1c 8.1%-  Lipid Panel    Component Value Date/Time   CHOL 108 (L) 03/10/2016 0925   TRIG 228 (H) 03/10/2016 0925   HDL 28 (L) 03/10/2016 0925   CHOLHDL 3.9 03/10/2016 0925   VLDL 46 (H) 03/10/2016 0925   LDLCALC 34 03/10/2016 0925    12/30/2018 Total cholesterol 143, triglycerides 252, HDL 35 LDL 67  [facility] 02/14/2021 BNP 887, hemoglobin 13.5, potassium 3.5, BUN 64, creatinine 2.65, magnesium 2.5     ASSESSMENT:    1. Chronic diastolic heart failure ([place])   2. Nonrheumatic aortic insufficiency with aortic stenosis   3. Chronic obstructive pulmonary disease, unspecified COPD type ([place])   4. Paroxysmal atrial fibrillation ([place])   5. Encounter for monitoring amiodarone therapy   6. Acquired thrombophilia ([place])   7. Coronary artery disease of native artery of native heart with stable angina pectoris ([place])   8. History of mitral valve replacement with bioprosthetic valve   9. Biventricular ICD (implantable cardioverter-defibrillator) - [facility]   10. CKD (chronic kidney disease) stage 4, GFR 15-29 ml/min (H[facility])   11. Essential hypertension   12. Diabetes mellitus type 2 in obese ([place])   13. Peripheral venous insufficiency   14. Severe obesity (BMI  35.0-39.9) with comorbidity ([place])      PLAN:  In order of problems listed above:  CHF: Appears to be clinically euvolemic today, and its possible that towards the end of the week after 3 doses of metolazone he is actually hypovolemic which causes orthostatic hypotension.  We will change his metolazone prescription back to an as needed protocol, take only when his home weight exceeds 255 pounds.  The simultaneous presence of significant chronic obstructive lung disease and obesity makes it very challenging to judge whether his shortness of breath is due to heart failure.  May have to do a future right heart catheterization (and probably a coronary angiography to that) if we can clarify his symptoms.  His BNP was markedly elevated at 882 in September and in October was down to 91.9.  EF is normal and he has good biventricular pacing.   Unable to take RAAS inhibitors due to renal dysfunction. He is not a candidate for SGLT2 inhibitors.   AS/AI: He has significant aortic insufficiency, although this may be less than severe.  While he is maintaining reasonable hemodynamic status, would avoid work-up for TAVR since this could lead to end-stage renal disease progression. He is not  a candidate for surgical valve replacement due to high risk (previous sternotomy, severe COPD, severe CKD), but I believe the anatomical changes in his valve would allow TAVR.   COPD: Once again strongly recommended smoking cessation.  Clearly a large part of his dyspnea is related to emphysema as his symptoms improved with bronchodilators.  Unfortunately he continues to smoke.  He will always have residual dyspnea due to emphysema.  In the past, even when we achieved euvolemia (even hypovolemia confirmed at right heart catheterization) he still had residual dyspnea and PFTs confirm significant obstructive lung disease.   Paroxysmal atrial fibrillation: This was likely the cause for his heart failure exacerbation episode in September  2022.  He has not had any recurrent arrhythmia in the last 6 months while taking amiodarone.  He is appropriately anticoagulated.  CHA2DS2-VASc 4 (age, HTN, DM, CHF). Amiodarone: Unfortunately there is no good alternative antiarrhythmic due to his renal insufficiency and heart failure.  Watch carefully for lung complications.  Will need liver and thyroid function test every 6 months (due in April 2023). Anticoagulation: Compliant with anticoagulation, no bleeding events. CAD: Denies angina.  He had a low risk nuclear stress test a year ago.   (Small area of lateral wall ischemia, good response to enhanced antianginal therapy with adding long-acting nitrates).   Continue beta-blockers, long-acting nitrates, high-dose statin.  We will plan coronary angiography if we decide to go to TAVR. S/p MVR: Normal prosthetic valve function, gradient slightly higher due to faster heart rate on most recent study.  The prosthesis is very well imaged on TEE and appears morphologically normal.  Needs endocarditis prophylaxis with dental procedures. CRT-D: Normal device function with 100% biventricular pacing.  During atrial fibrillation in September 2022, he lost BiV pacing  which I am sure contributed to heart failure exacerbation and may have been the major cause of his heart failure exacerbation. CKD stage 4: New creatinine baseline seems to be around 3.7 (GFR 18).  Understands that work-up for TAVR with coronary angiography and CT angiography will likely accelerate progression of his renal dysfunction.  We will put that off as long as it is safe. He already has a mature fistula that has not yet been used.  Nephrologist is Dr. [name].   HTN: Well-controlled. DM: On glipizide monotherapy.  Controlled. HLP: On atorvastatin, Labs followed by PCP.  Target LDL less than 70. Peripheral venous insufficiency/lymphedema: He has prominent bilateral varicose veins which contribute to peripheral edema, even when he is  euvolemic. Severe obesity  Medication Adjustments/Labs and Tests Ordered: Current medicines are reviewed at length with the patient today.  Concerns regarding medicines are outlined above.  Medication changes, Labs and Tests ordered today are listed in the Patient Instructions below. Patient Instructions  Medication Instructions:  CHANGE how you take the Metolazone to as needed. Take one tablet as needed for a weight over 255 pounds on your home scale. Do not take it more than 3 times in a week.  *If you need a refill on your cardiac medications before your next appointment, please call your pharmacy*   Lab Work: None ordered If you have labs (blood work) drawn today and your tests are completely normal, you will receive your results only by: [place] (if you have MyChart) OR A paper copy in the mail If you have any lab test that is abnormal or we need to change your treatment, we will call you to review the results.   Testing/Procedures: None ordered   Follow-Up:  At [facility], you and your health needs are our priority.  As part of our continuing mission to provide you with exceptional heart care, we have created designated Provider Care Teams.  These Care Teams include your primary Cardiologist (physician) and Advanced Practice Providers (APPs -  Physician Assistants and Nurse Practitioners) who all work together to provide you with the care you need, when you need it.  We recommend signing up for the patient portal called "MyChart".  Sign up information is provided on this After Visit Summary.  MyChart is used to connect with patients for Virtual Visits (Telemedicine).  Patients are able to view lab/test results, encounter notes, upcoming appointments, etc.  Non-urgent messages can be sent to your provider as well.   To learn more about what you can do with MyChart, go to [email or web address].    Your next appointment:   4 month(s)  The format for your next  appointment:   In Person  Provider:   [name], MD {      Signed, [name], MD  07/20/2021 12:28 PM    [address], [place], [place]  [address] Phone: [phone]; Fax: [phone]

## 2021-07-18 NOTE — Patient Instructions (Signed)
Medication Instructions:  CHANGE how you take the Metolazone to as needed. Take one tablet as needed for a weight over 255 pounds on your home scale. Do not take it more than 3 times in a week.  *If you need a refill on your cardiac medications before your next appointment, please call your pharmacy*   Lab Work: None ordered If you have labs (blood work) drawn today and your tests are completely normal, you will receive your results only by: [place] (if you have MyChart) OR A paper copy in the mail If you have any lab test that is abnormal or we need to change your treatment, we will call you to review the results.   Testing/Procedures: None ordered   Follow-Up: At [facility], you and your health needs are our priority.  As part of our continuing mission to provide you with exceptional heart care, we have created designated Provider Care Teams.  These Care Teams include your primary Cardiologist (physician) and Advanced Practice Providers (APPs -  Physician Assistants and Nurse Practitioners) who all work together to provide you with the care you need, when you need it.  We recommend signing up for the patient portal called "MyChart".  Sign up information is provided on this After Visit Summary.  MyChart is used to connect with patients for Virtual Visits (Telemedicine).  Patients are able to view lab/test results, encounter notes, upcoming appointments, etc.  Non-urgent messages can be sent to your provider as well.   To learn more about what you can do with MyChart, go to [email or web address].    Your next appointment:   4 month(s)  The format for your next appointment:   In Person  Provider:   [name], MD {

## 2021-07-20 ENCOUNTER — Encounter: Payer: Self-pay | Admitting: Cardiovascular Disease

## 2021-08-08 ENCOUNTER — Ambulatory Visit (INDEPENDENT_AMBULATORY_CARE_PROVIDER_SITE_OTHER): Payer: Medicare HMO

## 2021-08-08 DIAGNOSIS — I5043 Acute on chronic combined systolic (congestive) and diastolic (congestive) heart failure: Secondary | ICD-10-CM

## 2021-08-08 LAB — CUP PACEART REMOTE DEVICE CHECK
Battery Remaining Longevity: 30 mo
Battery Remaining Percentage: 45 %
Brady Statistic RA Percent Paced: 0 %
Brady Statistic RV Percent Paced: 100 %
Date Time Interrogation Session: 20230227000100
HighPow Impedance: 80 Ohm
Implantable Pulse Generator Implant Date: 20170119
Lead Channel Impedance Value: 407 Ohm
Lead Channel Impedance Value: 472 Ohm
Lead Channel Impedance Value: 640 Ohm
Lead Channel Pacing Threshold Amplitude: 0.7 V
Lead Channel Pacing Threshold Amplitude: 1.1 V
Lead Channel Pacing Threshold Amplitude: 1.1 V
Lead Channel Pacing Threshold Pulse Width: 0.4 ms
Lead Channel Pacing Threshold Pulse Width: 0.4 ms
Lead Channel Pacing Threshold Pulse Width: 0.7 ms
Lead Channel Setting Pacing Amplitude: 2 V
Lead Channel Setting Pacing Amplitude: 2.6 V
Lead Channel Setting Pacing Amplitude: 2.8 V
Lead Channel Setting Pacing Pulse Width: 0.4 ms
Lead Channel Setting Pacing Pulse Width: 0.7 ms
Lead Channel Setting Sensing Sensitivity: 0.6 mV
Lead Channel Setting Sensing Sensitivity: 1 mV
Pulse Gen Serial Number: 160713

## 2021-08-10 NOTE — Progress Notes (Signed)
Remote ICD transmission.   

## 2021-08-18 ENCOUNTER — Telehealth: Payer: Self-pay | Admitting: Cardiovascular Disease

## 2021-08-18 NOTE — Telephone Encounter (Signed)
Anne Hahn N.P would like to make Dr. Loletha Grayer aware about the patients lack of concern for his health, patient is supposed to be reaching out to [place] to set an appt in regard to the 10Ib weight gain since March... please advise  ?

## 2021-08-18 NOTE — Telephone Encounter (Signed)
I can send update to Dr.C- however, I did try to contact patient to discuss patients weight gain after receiving an update from PCP. Patient did not answer, LVM to call us back so we could be of assistance.  ? ?Will route back into triage to monitor - and try to contact patient again.  ? ?Thanks! ?

## 2021-08-18 NOTE — Telephone Encounter (Signed)
Anne Hahn N.P would like to make Dr. Loletha Grayer aware about the patients lack of concern for his health, patient is supposed to be reaching out to [place] to set an appt... please advise  ?

## 2021-08-22 NOTE — Telephone Encounter (Signed)
Thanks. Understand. ?

## 2021-08-22 NOTE — Telephone Encounter (Signed)
Wife of the patient was returning call  ?

## 2021-08-22 NOTE — Telephone Encounter (Signed)
Left a message for the patient to call back.  

## 2021-08-22 NOTE — Telephone Encounter (Signed)
Called and spoke with the patient. He stated that his current weight at home is 249 pounds. He is currently taking Torsemide 60 mg bid. He denies shortness of breath outside of his norm and denies excess swelling. He has taken the Metolazone once last week. He has been advised that this is for a weight of 255 lbs or higher and should not be taken more than three times in a week. ? ?He declined an earlier appointment stating that it was not needed. He has been advised to call back if he does need to be seen.  ? ? ?

## 2021-09-01 ENCOUNTER — Other Ambulatory Visit: Payer: Self-pay | Admitting: Cardiovascular Disease

## 2021-09-08 ENCOUNTER — Telehealth: Payer: Self-pay | Admitting: Cardiovascular Disease

## 2021-09-08 MED ORDER — TORSEMIDE 20 MG PO TABS: 60.0000 mg | ORAL_TABLET | Freq: Two times a day (BID) | ORAL | 3 refills | Status: DC

## 2021-09-08 NOTE — Telephone Encounter (Signed)
Refill sent to pharmacy.   

## 2021-09-08 NOTE — Telephone Encounter (Signed)
? ?*  STAT* If patient is at the pharmacy, call can be transferred to refill team. ? ? ?1. Which medications need to be refilled? (please list name of each medication and dose if known) torsemide (DEMADEX) 20 MG tablet ? ?2. Which pharmacy/location (including street and city if local pharmacy) is medication to be sent to? [place], [place] - [address] ? ?3. Do they need a 30 day or 90 day supply? 90 days ? ?Pt is out of meds needs refill today ?

## 2021-10-11 ENCOUNTER — Telehealth: Payer: Self-pay

## 2021-10-11 NOTE — Telephone Encounter (Signed)
REFERRAL ONLY SCANNED TO REFERRAL ?

## 2021-10-12 ENCOUNTER — Telehealth: Payer: Self-pay

## 2021-10-12 ENCOUNTER — Telehealth: Payer: Self-pay | Admitting: Cardiovascular Disease

## 2021-10-12 NOTE — Telephone Encounter (Signed)
Unfortunately, sounds like an extremely serious situation. ?Have a limited understanding of all the details, based on what is charted. ?It sounds like he has persistent signs of serious infection despite treatment with antibiotics.  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 ?Usually when there is infected hardware (pacemaker or artificial heart valve) we recommend removal of the hardware to allow resolution of the infection.  Unless the hardware is removed, may not be able to control the infection.  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  Also, after taking the pacemaker out it is likely that he will develop worsening heart failure due to the loss of biventricular pacing. ?When taking into account his advanced lung disease and kidney problems, the situation sounds dire. ?Very hard to give firm advice from a distance, like this. ?

## 2021-10-12 NOTE — Telephone Encounter (Signed)
NOTES SCANNED TO REFERRAL 

## 2021-10-12 NOTE — Telephone Encounter (Signed)
Spoke to the patient's wife, per dpr. She stated that the patient was currently at [facility] and was told that they want to take out the patient's pacemaker. She was unclear as to why the patient was hospitalized and why they wanted to take out the device. The son stated that he had two leaky valves and an infection.  ? ?She has been advised to consult with the provider in the hospital concerning the patient and to call back with an update.  ?

## 2021-10-12 NOTE — Telephone Encounter (Signed)
Pt wife states that pt is currently in the hospital and the doctors there stated that are wanting to remove pt's pacemaker. Pt's wife would like a call back from nurse or Dr. Sallyanne Kuster himself asap. Please advise ?

## 2021-10-31 ENCOUNTER — Ambulatory Visit (INDEPENDENT_AMBULATORY_CARE_PROVIDER_SITE_OTHER): Payer: Medicare Other | Admitting: Cardiovascular Disease

## 2021-10-31 ENCOUNTER — Encounter: Payer: Self-pay | Admitting: Cardiovascular Disease

## 2021-10-31 VITALS — BP 118/58 | HR 82 | Ht 71.0 in | Wt 244.8 lb

## 2021-10-31 DIAGNOSIS — N184 Chronic kidney disease, stage 4 (severe): Secondary | ICD-10-CM

## 2021-10-31 DIAGNOSIS — J449 Chronic obstructive pulmonary disease, unspecified: Secondary | ICD-10-CM

## 2021-10-31 DIAGNOSIS — I5032 Chronic diastolic (congestive) heart failure: Secondary | ICD-10-CM | POA: Diagnosis not present

## 2021-10-31 DIAGNOSIS — I330 Acute and subacute infective endocarditis: Secondary | ICD-10-CM

## 2021-10-31 DIAGNOSIS — E669 Obesity, unspecified: Secondary | ICD-10-CM

## 2021-10-31 DIAGNOSIS — E1169 Type 2 diabetes mellitus with other specified complication: Secondary | ICD-10-CM

## 2021-10-31 DIAGNOSIS — Z79899 Other long term (current) drug therapy: Secondary | ICD-10-CM

## 2021-10-31 DIAGNOSIS — D6869 Other thrombophilia: Secondary | ICD-10-CM

## 2021-10-31 DIAGNOSIS — I872 Venous insufficiency (chronic) (peripheral): Secondary | ICD-10-CM

## 2021-10-31 DIAGNOSIS — I352 Nonrheumatic aortic (valve) stenosis with insufficiency: Secondary | ICD-10-CM | POA: Diagnosis not present

## 2021-10-31 DIAGNOSIS — Z5181 Encounter for therapeutic drug level monitoring: Secondary | ICD-10-CM

## 2021-10-31 DIAGNOSIS — Z953 Presence of xenogenic heart valve: Secondary | ICD-10-CM

## 2021-10-31 DIAGNOSIS — B9561 Methicillin susceptible Staphylococcus aureus infection as the cause of diseases classified elsewhere: Secondary | ICD-10-CM

## 2021-10-31 DIAGNOSIS — I480 Paroxysmal atrial fibrillation: Secondary | ICD-10-CM

## 2021-10-31 DIAGNOSIS — Z95810 Presence of automatic (implantable) cardiac defibrillator: Secondary | ICD-10-CM

## 2021-10-31 DIAGNOSIS — I10 Essential (primary) hypertension: Secondary | ICD-10-CM

## 2021-10-31 DIAGNOSIS — I25118 Atherosclerotic heart disease of native coronary artery with other forms of angina pectoris: Secondary | ICD-10-CM

## 2021-10-31 NOTE — Patient Instructions (Addendum)
Medication Instructions:  No changes *If you need a refill on your cardiac medications before your next appointment, please call your pharmacy*   Lab Work: None ordered If you have labs (blood work) drawn today and your tests are completely normal, you will receive your results only by: [place] (if you have MyChart) OR A paper copy in the mail If you have any lab test that is abnormal or we need to change your treatment, we will call you to review the results.   Testing/Procedures: None ordered   Follow-Up: At [facility], you and your health needs are our priority.  As part of our continuing mission to provide you with exceptional heart care, we have created designated Provider Care Teams.  These Care Teams include your primary Cardiologist (physician) and Advanced Practice Providers (APPs -  Physician Assistants and Nurse Practitioners) who all work together to provide you with the care you need, when you need it.  We recommend signing up for the patient portal called "MyChart".  Sign up information is provided on this After Visit Summary.  MyChart is used to connect with patients for Virtual Visits (Telemedicine).  Patients are able to view lab/test results, encounter notes, upcoming appointments, etc.  Non-urgent messages can be sent to your provider as well.   To learn more about what you can do with MyChart, go to [email or web address].    Your next appointment:   2 month(s) on a device day  The format for your next appointment:   In Person  Provider:   [name], MD {  Information About [place]

## 2021-10-31 NOTE — Progress Notes (Signed)
`   Cardiology Office Note    Date:  11/03/2021   ID:  Dominic [name], DOB 24-Oct-1950, MRN [id number]  PCP:  Dominic Allegra, FNP  Cardiologist:   Dominic Klein, MD   Chief Complaint  Patient presents with   Endocarditis   Congestive Heart Failure    History of Present Illness:  Dominic [name] is a 71 y.o. male with combined systolic and diastolic heart failure due to ischemic cardiomyopathy, s/p bioprosthetic MVR, s/p CRT-D (dual chamber CRT-D [facility] Dynagen X4 device, implanted in 2017.  LV pacing vector is LV ring 3 to can), moderate aortic insufficiency, moderate to severe COPD, moderate to severe chronic kidney disease, type 2 diabetes mellitus complicated by nephropathy and neuropathy, history of postoperative atrial fibrillation (that has not been subsequently seen on his defibrillator checks), recent episode of CHF exacerbation and AFib.  He was hospitalized to [facility] on 09/27/2021 with sepsis and acute on chronic hypoxic respiratory failure and found to have MRSA bacteremia. He had persistent blood cultures despite treatment with vancomycin and cefepime and was subsequently switched to daptomycin and ceftaroline. There was suspicion of small vegetations on the mitral valve bioprosthesis, RV lead and tricuspid valve.  Lead extraction and replacement of his bioprosthetic mitral valve were considered but felt to carry prohibitive risk.  He received a tunneled right subclavian central venous catheter has been on IV daptomycin which he is due to continue for another couple of weeks, until 11/12/2021.  He is also receiving rifampin and some of his other medications have been temporarily stopped due to drug interactions ( off his Lipitor and Eliquis and is on warfarin; also off gabapentin, Topamax, Cymbalta).  After completion of the 6 weeks of antibiotics intravenously he is planning to go on chronic suppression with minocycline 100 mg twice daily.  He admitted and had a  follow-up TEE performed on 10/14/2021 at [facility] that did not show any evidence of vegetations on the prosthetic mitral valve or on the pacemaker leads and showed moderate aortic insufficiency.  The mean mitral valve gradient was 6 mmHg at a heart rate of 92 bpm.  He seems to be back to baseline, NYHA functional class III dyspnea.  No significant edema, no orthopnea or PND.  Does not have angina pectoris.  He has lost substantial weight during his 2 recent hospitalizations, about 15 pounds lighter than he was previously.  He denies dizziness, palpitations or syncope.  He has not had any falls or bleeding problems.  Device interrogation shows normal function.  Estimated generator longevity is 2.5 years.  He has 100% biventricular pacing.  He has not had any episodes of nonsustained VT or atrial fibrillation in the last 6 months.  His most recent creatinine on 10/18/2021 was 2.8, better than her previous estimated baseline.  Potassium was normal at 3.6.  Hemoglobin was 12.0 and his white blood cell count was normal at 5.9K  He continues to smoke about a pack of cigarettes daily.  His TEE on 04/26/2021 was interpreted as showing moderate AI. Transvalvular gradients were not reported, but the mean gradient was 20 mm Hg on TTE in October. The AI PHT was 350 ms (comparable to 326 ms on TTE). LVOT diameter on the TTE was overestimated at 2.8 cm. DVI was 0.39  - It is very challenging to measure his LVOT accurately due to shadowing from the mitral prosthetic annulus.  I think the LVOT actually measures around 2.3 cm, adjusting for this the actual calculated aortic  valve area would be approximately 1.6 cm, consistent with mild-moderate aortic stenosis and moderate to severe AI.  Morphologically, he has a 3 leaflet aortic valve with moderate thickening and moderate calcification.  The noncoronary cusp on the left coronary cusp look relatively restrictive and motion.  I think there is enough degenerative change that his  aortic valve could hold a TAVR stent.    For the first time in years he required hospitalization for congestive heart failure on 02/14/2021.  On presentation he was found to be in atrial fibrillation with rapid ventricular response.  He reports beginning to feel unwell 1 or 2 days before his hospitalization, with worsening exertional dyspnea.  He was not aware of any palpitations, but on arrival to the emergency room he had atrial fibrillation with RVR that did not respond to treatment with intravenous amiodarone and metoprolol so that he was eventually cardioverted with a single shock at 200 J on the day of admission.  He was started on Eliquis.  The notes from [facility] state that he was evaluated by nephrology while in the hospital and that his renal function was stable.  He has not had angina pectoris since the addition of isosorbide mononitrate.  Nuclear stress test in April 2021 showed a small area of lateral wall ischemia.  Noninvasive/conservative approach recommended due to his renal failure.  His last coronary angiogram was performed in 2016 when he received 2 stents.  [name] had severe ischemic cardiomyopathy with left ventricular ejection fraction estimated to be approximately 30% by echocardiogram performed in October 2016. That echo describes septal and apical akinesis with global hypokinesis. He had evidence of restrictive filling as well as moderate right ventricular systolic dysfunction and moderate aortic insufficiency. Echo in May 2017 (after PCI and CRT) shows improved LVEF 45-50% and normal function of the mitral valve prosthesis. Right heart catheterization in May 2017 showed right atrial pressure 8, mean wedge pressure 6, cardiac index 2.2 L/minute.   He has an extensive history of coronary artery disease. He tells me that he thinks he has had 9 cardiac catheterizations and 5 stents. At least on one admission he had 2 episodes of cardiac arrest. I think this was during his  hospitalization for bypass surgery/valve replacement. He underwent bypass surgery in [place] in 2015 with synchronous mitral valve replacement with a biological prosthesis. He has a history of previous stent placed in the LAD artery, type of stent and date of procedure unknown. He underwent placement of a bare metal stent to the ostium of the left circumflex coronary artery in September 2015. He subsequently underwent placement of 2 drug-eluting stents at [facility] in October 2016, placed in the right coronary artery and left circumflex coronary artery respectively. At the time of that cardiac catheterization he had 50% stenosis in the distal left main, 90% proximal LAD, 70% in-stent restenosis proximal LAD, patent LIMA to distal LAD, 80% RCA. No mention is made of his second bypass graft, which I presume is occluded. He is on combination aspirin/clopidogrel.  At the time of his bypass surgery received a 31 mm [employer] mitral bioprosthetic valve. By echocardiogram last October 2016 the mean transvalvular gradient was 5 mmHg. The same echocardiogram described a trileaflet unrestricted aortic valve with moderate insufficiency.  His chart describes a history of postoperative atrial fibrillation. Amiodarone was stopped after several years without Afib, but this recurred in September 2022. Amiodarone and anticoagulation were restarted.  He has a history of left bundle branch  block and he received a CRT-D [employer] device on 07/01/2015. The device and the leads are MRI conditional.  He has advanced chronic kidney disease. With recent baseline creatinine around 3.0-3.5. During his hospitalization in January 2017 the peak creatinine reached 4.6 (acute diarrheal illness), reaching a similar level in August 2017 when he had nausea and vomiting.[name] He has type 2 diabetes mellitus (not requiring insulin), complicated by kidney disease and neuropathy.  He takes atorvastatin in a relatively low  dose. He has long-standing treated hypertension. He is a former smoker and has a history of COPD and takes bronchodilators. He has a history of migraine headaches treated with Fioricet. He underwent sigmoid colectomy in November 2017 for colon cancer.  Past Medical History:  Diagnosis Date   Biventricular ICD (implantable cardioverter-defibrillator) - [facility] 09/05/2015   Jul 01, 2015 [facility] Dynagen X4   CAD (coronary artery disease) 09/05/2015   CABG 2015 PCI-BMS ostial LCX Sept 2015 04/05/2015 cath 50% distal LM, 90% prox LAD, old stent prox LAD 70% ISR, patent LIMA-LAD, 80% prox RCA PCI-DES x2 RCA and LCX    Chronic systolic CHF (congestive heart failure) ([place]) 09/05/2015   a. [place] 10/29/15: PCW 8, CI 2.2. Overall low filling presssures. Mean PA 20; b. echo 10/30/15: EF 45-50%, not tech suff to allwo for LV diastolic fxn, mild AI, nl appearing MVR   CKD stage 4 due to type 2 diabetes mellitus ([place]) 09/05/2015   Diabetes mellitus, type 2 ([place]) 09/05/2015   Essential hypertension 09/05/2015   History of mitral valve replacement with bioprosthetic valve 09/05/2015   31 mm Edwards 2015, Dr. Jerelene Redden   Postoperative atrial fibrillation ([place]) 09/05/2015    Past Surgical History:  Procedure Laterality Date   CARDIAC CATHETERIZATION N/A 10/29/2015   Procedure: Right Heart Cath;  Surgeon: Jolaine Artist, MD;  Location: [place] [facility];  Service: Cardiovascular;  Laterality: N/A;   CARDIAC VALVE REPLACEMENT  2015   56m Edwards bioprosthesis   CORONARY ANGIOPLASTY     CORONARY ARTERY BYPASS GRAFT  2015   [facility], Dr. MJerelene Redden   TEE WITHOUT CARDIOVERSION N/A 04/26/2021   Procedure: TRANSESOPHAGEAL ECHOCARDIOGRAM (TEE);  Surgeon: HPixie Casino MD;  [facility];  Service: Cardiovascular;  Laterality: N/A;    Current Medications: Outpatient Medications Prior to Visit  Medication Sig Dispense Refill   albuterol (PROVENTIL HFA;VENTOLIN HFA) 108 (90 Base) MCG/ACT inhaler Inhale 2 puffs into the lungs  every 6 (six) hours as needed for wheezing or shortness of breath.     amiodarone (PACERONE) 200 MG tablet Take 1 tablet (200 mg total) by mouth 2 (two) times daily. 90 tablet 3   B Complex-C (SUPER B COMPLEX PO) Take 1 capsule by mouth daily.     Cholecalciferol (VITAMIN D) 50 MCG (2000 UT) tablet Take 2,000 Units by mouth daily.     docusate sodium (COLACE) 100 MG capsule Take 100 mg by mouth 2 (two) times daily.     DULoxetine (CYMBALTA) 60 MG capsule Take 60 mg by mouth daily.      famotidine (PEPCID) 20 MG tablet Take 10 mg by mouth daily.     ferrous sulfate 325 (65 FE) MG tablet Take 325 mg by mouth daily with breakfast.     fluticasone (FLONASE) 50 MCG/ACT nasal spray Place 1 spray into both nostrils daily as needed for allergies.     fluticasone-salmeterol (ADVAIR HFA) 45-21 MCG/ACT inhaler Inhale 1 puff into the lungs 2 (two) times daily.  furosemide (LASIX) 40 MG tablet Take 40 mg by mouth 3 (three) times daily.     glucose blood test strip 1 each by Other route as needed for other. Use as instructed     Insulin Pen Needle (PEN NEEDLES 3/16") 31G X 5 MM MISC Use with insulin pen daily     isosorbide mononitrate (IMDUR) 30 MG 24 hr tablet TAKE 1 TABLET BY MOUTH EVERY DAY 90 tablet 2   LANTUS SOLOSTAR 100 UNIT/ML Solostar Pen Inject 58 Units into the skin daily.     metolazone (ZAROXOLYN) 2.5 MG tablet Take one tablet once daily as needed for a weight over 255 pounds. Do not take more than 3 days a week. 4 tablet 6   mirtazapine (REMERON) 7.5 MG tablet Take 7.5 mg by mouth at bedtime.     potassium chloride SA (KLOR-CON) 20 MEQ tablet TAKE 2 TABLETS BY MOUTH THREE TIMES A DAY 180 tablet 5   rifampin (RIFADIN) 300 MG capsule Take 600 mg by mouth daily.     sucralfate (CARAFATE) 1 g tablet Take 1 g by mouth 2 (two) times daily.     TECHLITE PEN NEEDLES 31G X 5 MM MISC USE WITH INSULIN PEN DAILY     warfarin (COUMADIN) 5 MG tablet Take 5 mg by mouth every evening.     atorvastatin  (LIPITOR) 40 MG tablet Take 40 mg by mouth daily.     glipiZIDE (GLUCOTROL) 5 MG tablet Take 5 mg by mouth daily before breakfast.     HYDROcodone-acetaminophen (NORCO/VICODIN) 5-325 MG tablet Take 1 tablet by mouth every 6 (six) hours as needed for moderate pain. (Patient not taking: Reported on 10/31/2021)     hydrocortisone cream 1 % Apply 1 application. topically daily as needed for itching. (Patient not taking: Reported on 10/31/2021)     nitroGLYCERIN (NITROSTAT) 0.4 MG SL tablet Place 0.4 mg under the tongue every 5 (five) minutes as needed for chest pain. (Patient not taking: Reported on 10/31/2021)     polyethylene glycol powder (GLYCOLAX/MIRALAX) 17 GM/SCOOP powder Take 1 Container by mouth daily. (Patient not taking: Reported on 10/31/2021)     tiZANidine (ZANAFLEX) 2 MG tablet Take 2 mg by mouth 2 (two) times daily as needed for muscle spasms. (Patient not taking: Reported on 10/31/2021)     apixaban (ELIQUIS) 5 MG TABS tablet Take 1 tablet (5 mg total) by mouth 2 (two) times daily. (Patient not taking: Reported on 10/31/2021) 60 tablet 5   torsemide (DEMADEX) 20 MG tablet Take 3 tablets (60 mg total) by mouth 2 (two) times daily. (Patient not taking: Reported on 10/31/2021) 180 tablet 3   No facility-administered medications prior to visit.     Allergies:   Hydrochlorothiazide, Amoxicillin-pot clavulanate, Clindamycin, Lisinopril, Meloxicam, Topiramate, and Tamsulosin   Social History   Socioeconomic History   Marital status: Married    Spouse name: Not on file   Number of children: Not on file   Years of education: Not on file   Highest education level: Not on file  Occupational History   Not on file  Tobacco Use   Smoking status: Some Days    Packs/day: 0.50    Years: 40.00    Pack years: 20.00    Types: Cigarettes   Smokeless tobacco: Never  Substance and Sexual Activity   Alcohol use: No    Alcohol/week: 0.0 standard drinks   Drug use: No   Sexual activity: Not Currently   Other Topics Concern  Not on file  Social History Narrative   Not on file   Social Determinants of Health   Financial Resource Strain: Not on file  Food Insecurity: Not on file  Transportation Needs: Not on file  Physical Activity: Not on file  Stress: Not on file  Social Connections: Not on file     Family History:  The patient's family history includes Hyperlipidemia in his father; Hypertension in his father.   ROS:   Please see the history of present illness.    ROS All other systems reviewed and are negative.   PHYSICAL EXAM:   VS:  BP (!) 118/58 (BP Location: Left Arm, Patient Position: Sitting, Cuff Size: Large)   Pulse 82   Ht 5' 11" (1.803 m)   Wt 244 lb 12.8 oz (111 kg)   SpO2 96%   BMI 34.14 kg/m      General: Alert, oriented x3, no distress, has lost little weight but remains severely obese.  Healthy left subclavian defibrillator site.  Healthy dressing over right subclavian tunneled catheter site. Head: no evidence of trauma, PERRL, EOMI, no exophtalmos or lid lag, no myxedema, no xanthelasma; normal ears, nose and oropharynx Neck: normal jugular venous pulsations and no hepatojugular reflux; brisk carotid pulses without delay and no carotid bruits Chest: clear to auscultation, no signs of consolidation by percussion or palpation, normal fremitus, symmetrical and full respiratory excursions Cardiovascular: normal position and quality of the apical impulse, regular rhythm, normal first and split second heart sounds, 2/6 aortic ejection murmur radiating towards the carotids, 2/6 aortic decrescendo diastolic murmur at the left lower sternal border, no apical murmurs, rubs or gallops.  Excellent thrill and bruit over his forearm AV fistula. Abdomen: no tenderness or distention, no masses by palpation, no abnormal pulsatility or arterial bruits, normal bowel sounds, no hepatosplenomegaly Extremities: no clubbing, cyanosis or edema; 2+ radial, ulnar and brachial pulses  bilaterally; 2+ right femoral, posterior tibial and dorsalis pedis pulses; 2+ left femoral, posterior tibial and dorsalis pedis pulses; no subclavian or femoral bruits Neurological: grossly nonfocal Psych: Normal mood and affect   Wt Readings from Last 3 Encounters:  10/31/21 244 lb 12.8 oz (111 kg)  07/18/21 262 lb 3.2 oz (118.9 kg)  05/25/21 252 lb (114.3 kg)    Studies/Labs Reviewed:  Transesophageal Echocardiogram W/O Contrast NOVANT April 2023 Final Result  Left Ventricle: Systolic function is normal. EF: 55-60%.   Right Ventricle: Right ventricle is mildly dilated.   Mitral Valve: The prosthetic valve appears to be well-seated without  rocking.   Mitral Valve: There is evidence of a posterior leaflet vegetation that  is small and mobile on the atrial side.   Mitral Valve: There is mild regurgitation.   Tricuspid Valve: There is evidence of a posterior leaflet vegetation  that is small.   Tricuspid Valve: There is mild to moderate regurgitation.   Aortic Valve: Mild aortic valve regurgitation.   10/14/21 TEE SUMMARY The left ventricular size and function is normal. Device lead in the right ventricle without vegetation. There is moderate aortic regurgitation. Early diastolic flow reversal of the descending aorta. There is a prosthetic mitral valve with a mean gradient of 6.2 mmHg at a HR of 92. No valvular vegetations noted. Ascending and desending aorta normal size with severe atherosclerotic plaque. No prior TEE for comparison.    TEE 04/26/2021  1. Left ventricular ejection fraction, by estimation, is 55 to 60%. The left ventricle has normal function. There is moderate left ventricular hypertrophy.  2. Right ventricular systolic function is mildly reduced. The right ventricular size is normal.   3. Left atrial size was mildly dilated. No left atrial/left atrial  appendage thrombus was detected.   4. 2D/3D visualization. The mitral valve has been repaired/replaced.  Trivial mitral valve regurgitation. No evidence of mitral stenosis. The mean mitral valve gradient is 4.0 mmHg with average heart rate of 87 bpm.  There is a 31 mm Edwards bioprosthetic   valve present in the mitral position. Procedure Date: 06/12/2013.  Echo findings are consistent with normal structure and function of the mitral valve prosthesis.   5. The tricuspid valve is abnormal. Tricuspid valve regurgitation is mild to moderate.   6. The aortic valve is tricuspid. There is mild calcification of the aortic valve. There is moderate thickening of the aortic valve. Aortic valve regurgitation is moderate. Mild aortic valve stenosis. Aortic regurgitation PHT measures 359 msec.   Comparison(s): 03/17/2021: LVEF 55-60%, MV gradient 6 mmHg, moderate to severe AI, mild AS.   Echo 03/17/2021   1. Left ventricular ejection fraction, by estimation, is 55 to 60%. The  left ventricle has normal function. The left ventricle has no regional  wall motion abnormalities. There is moderate concentric left ventricular  hypertrophy of the basal-septal  segment. Left ventricular diastolic function could not be evaluated.   2. Right ventricular systolic function is mildly reduced. The right  ventricular size is normal. There is normal pulmonary artery systolic  pressure. The estimated right ventricular systolic pressure is 18.8 mmHg.   3. Left atrial size was mildly dilated.   4. The mitral valve has been repaired/replaced. Mild mitral valve  regurgitation. No evidence of mitral stenosis. The mean mitral valve  gradient is 6.0 mmHg with HR 81bpm.   5. The aortic valve is tricuspid. There is moderate calcification of the  aortic valve. There is moderate thickening of the aortic valve. Aortic  valve regurgitation is moderate by PHT but visually appears moderate to  severe. Mild aortic valve stenosis.  Aortic regurgitation PHT measures 326 msec. Aortic valve mean gradient  measures 20.0 mmHg. Aortic valve Vmax  measures 2.92 m/s.   6. Aortic dilatation noted. There is mild dilatation of the ascending  aorta, measuring 41 mm.   7. The inferior vena cava is normal in size with greater than 50%  respiratory variability, suggesting right atrial pressure of 3 mmHg.   8. Compared to prior echo, the mean MVG of the MV bioprosthesis has  increased from 4 to 57mHg but HR faster on this study. AI appears to have  progressed. The AI is moderate by PHT but visually appears moderate to  severe. Consider TEE for further  quantification of AI.    Myoview 10/10/2019 Nuclear stress EF: 49%. The left ventricular ejection fraction is mildly decreased (45-54%). Defect 1: There is a small defect of mild severity present in the basal anterolateral, mid anterolateral and apical lateral location. This is an intermediate risk study.   1. Reversible basal to apical anterolateral perfusion defect consistent with ischemia. 2. Septal hypokinesis, which may be due to paced rhythm  EKG:  EKG is ordered today.  It shows atrial sensed (sinus), biventricular paced rhythm with a positive are in lead V1 but a broad QRS of 168 ms.  QTc 558 ms  Recent Labs: 03/15/2021: ALT 31; BNP 91.9; TSH 3.710 04/22/2021: BUN 75; Creatinine, Ser 3.55; Hemoglobin 15.8; Platelets 238; Potassium 3.5; Sodium 135  07/22/2020 creatinine 2.66, potassium 3.6, hemoglobin A1c 8.1%-  Lipid Panel    Component Value Date/Time   CHOL 108 (L) 03/10/2016 0925   TRIG 228 (H) 03/10/2016 0925   HDL 28 (L) 03/10/2016 0925   CHOLHDL 3.9 03/10/2016 0925   VLDL 46 (H) 03/10/2016 0925   LDLCALC 34 03/10/2016 0925    05/25/2021 Total cholesterol 130, triglycerides 236, HDL 30, LDL 70  10/18/2021 hemoglobin 12.0, WBC 5.9, creatinine 2.82, potassium 3.6, alk phos 116 but otherwise normal LFTs  hemoglobin A1c  8.0% on 10/11/2021  ASSESSMENT:    1. Chronic diastolic heart failure (HCC)   2. 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 Nonrheumatic aortic insufficiency with aortic stenosis   4. Chronic obstructive pulmonary disease, unspecified COPD type ([place])   5. Paroxysmal atrial fibrillation ([place])   6. Encounter for monitoring amiodarone therapy   7. Acquired thrombophilia ([place])   8. Coronary artery disease of native artery of native heart with stable angina pectoris ([place])   9. History of mitral valve replacement with bioprosthetic valve   10. Biventricular ICD (implantable cardioverter-defibrillator) - [facility]   11. CKD (chronic kidney disease) stage 4, GFR 15-29 ml/min (HCC)   12. Essential hypertension   13. 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 Severe obesity (BMI 35.0-39.9) with comorbidity ([place])      PLAN:  In order of problems listed above:  Endocarditis: I do not think he would survive attempted pacemaker lead extraction and bioprosthetic valve replacement.  I think the best option is continued medical therapy with suppressive antibiotics.  Clinically, currently does not have signs of active infection.   CHF: Appears to be clinically euvolemic.  Clearly his dry weight is now lower, probably around 240 pounds on his home scale.  NYHA functional class III by dyspnea is largely due to COPD/lung disease rather than heart failure.   The simultaneous presence of significant chronic obstructive lung disease and obesity makes it very challenging to judge whether his shortness of breath is due to heart failure.   Unable to take RAAS inhibitors due to renal dysfunction. He is not a candidate for SGLT2 inhibitors.   AS/AI: Aortic insufficiency was again deemed to be moderate by most recent TEE performed at [facility]. He is not a candidate for surgical valve replacement due to high risk (previous sternotomy, severe COPD, severe CKD), but I believe the anatomical changes in his valve would allow TAVR.  Active MRSA endocarditis would preclude this. COPD: Continues to smoke.   Once again strongly recommended smoking cessation.  Clearly a large part of his dyspnea is related to emphysema as his symptoms improved with bronchodilators.  Unfortunately he continues to smoke.  He will always have residual dyspnea due to emphysema.  In the past, even when we achieved euvolemia (even hypovolemia confirmed at right heart catheterization) he still had residual dyspnea and PFTs confirm significant obstructive lung disease.   Paroxysmal atrial fibrillation: This was likely the cause for his heart failure exacerbation episode in September 2022, but while on amiodarone therapy has not had any recurrent atrial fibrillation since September.   He is appropriately anticoagulated.  CHA2DS2-VASc 4 (age, HTN, DM, CHF). Amiodarone: Not the most desirable drug in view of his lung problems, but there are no other acceptable antiarrhythmics due to his renal insufficiency and heart failure.  He had normal liver function tests earlier this month.  TSH was last checked in October and he is due for repeat evaluation. Anticoagulation:  Switched to warfarin due to simultaneous treatment with rifampin.  Once rifampin is stopped he can go back on Eliquis.  Compliant with anticoagulation, no bleeding events. CAD: Denies angina.  He had a low risk nuclear stress test a year ago.   (Small area of lateral wall ischemia, good response to enhanced antianginal therapy with adding long-acting nitrates).   Continue beta-blockers, long-acting nitrates, high-dose statin.  We will plan coronary angiography only if we decide to go to TAVR. S/p MVR: Suggestion of infection/vegetation on TEE performed in April at [facility].  Normal prosthetic valve function, gradient slightly higher due to faster heart rate on most recent study.  Currently on antibiotics.  Plan suppressive minocycline therapy lifelong.  Needs additional endocarditis prophylaxis with dental procedures. 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  During atrial fibrillation in September 2022, he lost BiV pacing  which I am sure contributed to heart failure exacerbation and may have been the major cause of his heart failure exacerbation.  This is 1 of many reasons why I do not think he will survive device extraction. CKD stage 4: Paradoxically, there has been some recent improvement in renal function. [name] He already has a mature fistula that has not yet been used.  Nephrologist is Dr. [name].   HTN: Well-controlled. DM: Previously well controlled, some recent deterioration likely due to acute illness with hemoglobin A1c of 8.0% on 10/11/2021 HLP: On atorvastatin before the acute illness, currently stopped due to drug interactions with daptomycin.  To restart this when he is off daptomycin.  Most recent LDL was in target ranges 70. Peripheral venous insufficiency/lymphedema: He has prominent bilateral varicose veins which contribute to peripheral edema, even when he is euvolemic. Severe obesity: Has lost some weight during the acute illness.  Medication Adjustments/Labs and Tests Ordered: Current medicines are reviewed at length with the patient today.  Concerns regarding medicines are outlined above.  Medication changes, Labs and Tests ordered today are listed in the Patient Instructions below. Patient Instructions  Medication Instructions:  No changes *If you need a refill on your cardiac medications before your next appointment, please call your pharmacy*   Lab Work: None ordered If you have labs (blood work) drawn today and your tests are completely normal, you will receive your results only by: [place] (if you have MyChart) OR A paper copy in the mail If you have any lab test that is abnormal or we need to change your treatment, we will call you to review the results.   Testing/Procedures: None ordered   Follow-Up: At [facility], you and your health needs are our priority.  As part of our  continuing mission to provide you with exceptional heart care, we have created designated Provider Care Teams.  These Care Teams include your primary Cardiologist (physician) and Advanced Practice Providers (APPs -  Physician Assistants and Nurse Practitioners) who all work together to provide you with the care you need, when you need it.  We recommend signing up for the patient portal called "MyChart".  Sign up information is provided on this After Visit Summary.  MyChart is used to connect with patients for Virtual Visits (Telemedicine).  Patients are able to view lab/test results, encounter notes, upcoming appointments, etc.  Non-urgent messages can be sent to your provider as well.   To learn more about what you can do with MyChart, go to [email or web address].    Your next appointment:   2 month(s) on a device day  The format for your next appointment:   In Person  Provider:   [name], MD {  Information About Sugar          Signed, Dominic Klein, MD  11/03/2021 12:55 PM    [address], [place], [place]  [id number] Phone: [phone]; Fax: [phone]

## 2021-11-14 ENCOUNTER — Encounter: Payer: Medicare Other | Admitting: Cardiovascular Disease

## 2021-11-22 ENCOUNTER — Encounter: Payer: Self-pay | Admitting: Physician Assistant

## 2021-11-22 ENCOUNTER — Ambulatory Visit (INDEPENDENT_AMBULATORY_CARE_PROVIDER_SITE_OTHER): Payer: Medicare Other | Admitting: Physician Assistant

## 2021-11-22 VITALS — BP 122/64 | HR 87 | Ht 71.0 in | Wt 246.0 lb

## 2021-11-22 DIAGNOSIS — I5042 Chronic combined systolic (congestive) and diastolic (congestive) heart failure: Secondary | ICD-10-CM

## 2021-11-22 DIAGNOSIS — Z953 Presence of xenogenic heart valve: Secondary | ICD-10-CM

## 2021-11-22 DIAGNOSIS — N185 Chronic kidney disease, stage 5: Secondary | ICD-10-CM

## 2021-11-22 DIAGNOSIS — E785 Hyperlipidemia, unspecified: Secondary | ICD-10-CM

## 2021-11-22 DIAGNOSIS — I2510 Atherosclerotic heart disease of native coronary artery without angina pectoris: Secondary | ICD-10-CM

## 2021-11-22 DIAGNOSIS — R7881 Bacteremia: Secondary | ICD-10-CM | POA: Diagnosis not present

## 2021-11-22 DIAGNOSIS — I480 Paroxysmal atrial fibrillation: Secondary | ICD-10-CM | POA: Diagnosis not present

## 2021-11-22 DIAGNOSIS — E119 Type 2 diabetes mellitus without complications: Secondary | ICD-10-CM

## 2021-11-22 DIAGNOSIS — J449 Chronic obstructive pulmonary disease, unspecified: Secondary | ICD-10-CM

## 2021-11-22 NOTE — Patient Instructions (Signed)
Medication Instructions:  Your physician recommends that you continue on your current medications as directed. Please refer to the Current Medication list given to you today.  *If you need a refill on your cardiac medications before your next appointment, please call your pharmacy*  Lab Work: NONE ordered at this time of appointment   If you have labs (blood work) drawn today and your tests are completely normal, you will receive your results only by: [place] (if you have MyChart) OR A paper copy in the mail If you have any lab test that is abnormal or we need to change your treatment, we will call you to review the results.  Testing/Procedures: NONE ordered at this time of appointment   Follow-Up: At [facility], you and your health needs are our priority.  As part of our continuing mission to provide you with exceptional heart care, we have created designated Provider Care Teams.  These Care Teams include your primary Cardiologist (physician) and Advanced Practice Providers (APPs -  Physician Assistants and Nurse Practitioners) who all work together to provide you with the care you need, when you need it.  We recommend signing up for the patient portal called "MyChart".  Sign up information is provided on this After Visit Summary.  MyChart is used to connect with patients for Virtual Visits (Telemedicine).  Patients are able to view lab/test results, encounter notes, upcoming appointments, etc.  Non-urgent messages can be sent to your provider as well.   To learn more about what you can do with MyChart, go to [email or web address].    Your next appointment:   1 month(s)  The format for your next appointment:   In Person  Provider:   APP on a day the Dr. Sallyanne Kuster is in the office         Other Instructions   Important Information About Sugar

## 2021-11-22 NOTE — Progress Notes (Signed)
Cardiology Office Note:    Date:  11/24/2021   ID:  Dominic [name], DOB Nov 08, 1950, MRN [id number]  PCP:  Dominic Allegra, FNP   [facility] Providers Cardiologist:  Dominic Klein, MD     Referring MD: Dominic [name]*   Chief Complaint  Patient presents with   Follow-up    Seen for Dr. Sallyanne [name]    History of Present Illness:    Dominic [name] is a 71 y.o. male with a hx of chronic systolic and diastolic heart failure due to ischemic cardiomyopathy, history of bioprosthetic MVR, CAD, CRT-D with recent removal, moderate aortic insufficiency, moderate to severe COPD, CKD stage IV, HLD, DM 2 with nephropathy and neuropathy, and history of postop A-fib. According to the previous note, patient suspected he had 12 cardiac catheterization and 5 stents placed in the past.  He also had one admission where he had 2 episodes of cardiac arrest.  He underwent bypass surgery and MVR in [place] in 2015.  He underwent BMS to ostial left circumflex artery in September 2015.  He underwent 2 stent placement at [facility] in 2016.  Myoview in April 2021 showed small area of lateral wall ischemia.  Medical therapy was recommended due to his renal failure.  Patient was hospitalized in September 2022 due to congestive heart failure and A-fib with RVR.  He subsequently underwent cardioversion and was started on Eliquis.   He was hospitalized at [facility] on 09/27/2021 with sepsis and acute on chronic hypoxic respiratory failure and found to have MRSA bacteremia.  Patient had persistent blood culture despite treatment with vancomycin, cefepime and subsequently switched to daptomycin and ceftaroline.  There was suspicion of a small vegetation on the mitral valve bioprosthesis and RV lead and tricuspid valve.  Lead extraction and replacement of his bioprosthetic mitral valve was considered but felt to be prohibitive when it comes to risk associated with it.  He received a tunneled right subclavian  central venous catheter and has been receiving IV antibiotic.  He was taken off of Lipitor and Eliquis due to drug interaction and was placed on Coumadin.  After completion of 6 weeks of IV antibiotic, he was planning to go on chronic suppression with minocycline 100 mg twice a day.  He underwent TEE on 10/14/2021 that did not show any evidence of vegetation on the prosthetic mitral valve or on the pacemaker lead and showed moderate AI.  He was last seen by Dr. Sallyanne [name] on 10/31/2021, he had 100% biventricular pacing.  There was no A-fib or nonsustained VT in the past 6 months.   Unfortunately, since the last visit, patient was admitted to the [facility] hospital on 11/03/2021 with bacterial endocarditis.  Blood culture was positive for gram-positive cocci even despite the recent antibiotic treatment.  Repeat echocardiogram obtained on 11/04/2021 demonstrated EF 45 to 50%, moderate AI, mild to moderate aortic stenosis, bioprosthetic mitral valve with evidence of a vegetation that is small and mobile on the mitral valve, mild MR.  Patient was subsequently transferred to [facility] due to endocarditis.  Brain MRI showed no abnormalities.  TEE also did not show any vegetation either.  EP was consulted for recommendation regarding the extraction of his ICD.  CT surgery in the EP eventually recommended extraction of the device given recurrent and persistent MRSA bacteremia.  On 11/10/2021, patient was taken to the EP lab and underwent successful laser lead extraction.  Lead was sent for culture and results monitored surgery hospitalization without additional  growth.  Postprocedure, patient was admitted to cardiology floor for continue observation.  ID with final recommendation for continued therapy including daptomycin, ceftaroline and p.o. rifampin.  Patient was discharged to continue antibiotic via home health nurse.  It was recommended for the patient to follow-up with the EP in 6 to 8 weeks and a TEE after  completion of antibiotics.  It was also recommended for the patient to have weekly CBC with differential, CMP, CK, noncardiac CRP and ESR.  Patient presents today along with his wife.  His pacemaker extraction site is well-healed.  I took the dressing off and left Steri-Strip on.  There is no bleeding or sign of infection at the pacemaker extraction site.  He will continue IV antibiotic through tunnel catheter until 7/13, based on the initial recommendation by discharge physician, he was supposed to have a TEE afterward.  I will arrange follow-up in 1 month after he has completed his IV antibiotic.  Patient does not appear to be interested in repeat TEE.  Surprisingly, previous TEE does not show any signs of endocarditis, I am not entirely confident of the benefit of repeat TEE given lack of endocarditis finding on the previous TEE, however will defer final recommendation to primary cardiologist.  I will set him up to see Dr. Sallyanne [name] in 1 month.  His Lipitor, Lasix, glipizide and Toprol-XL has been held due to interaction with antibiotic.  We likely will resume Toprol-XL, Lipitor and Lasix in the future.  He appears to be euvolemic on exam.  He has more left lower extremity edema compared to the right side due to cellulitis in the left lower extremity.  Otherwise, his lung is clear and appears to be euvolemic despite only taking the diuretic as needed at this time.   Past Medical History:  Diagnosis Date   Biventricular ICD (implantable cardioverter-defibrillator) - [facility] 09/05/2015   Jul 01, 2015 [facility] Dynagen X4   CAD (coronary artery disease) 09/05/2015   CABG 2015 PCI-BMS ostial LCX Sept 2015 04/05/2015 cath 50% distal LM, 90% prox LAD, old stent prox LAD 70% ISR, patent LIMA-LAD, 80% prox RCA PCI-DES x2 RCA and LCX    Chronic systolic CHF (congestive heart failure) ([place]) 09/05/2015   a. [place] 10/29/15: PCW 8, CI 2.2. Overall low filling presssures. Mean PA 20; b. echo 10/30/15: EF 45-50%, not tech suff to  allwo for LV diastolic fxn, mild AI, nl appearing MVR   CKD stage 4 due to type 2 diabetes mellitus ([place]) 09/05/2015   Diabetes mellitus, type 2 ([place]) 09/05/2015   Essential hypertension 09/05/2015   History of mitral valve replacement with bioprosthetic valve 09/05/2015   31 mm Edwards 2015, Dr. Jerelene Redden   Postoperative atrial fibrillation ([place]) 09/05/2015    Past Surgical History:  Procedure Laterality Date   CARDIAC CATHETERIZATION N/A 10/29/2015   Procedure: Right Heart Cath;  Surgeon: Jolaine Artist, MD;  Location: [facility];  Service: Cardiovascular;  Laterality: N/A;   CARDIAC VALVE REPLACEMENT  2015   56m Edwards bioprosthesis   CORONARY ANGIOPLASTY     CORONARY ARTERY BYPASS GRAFT  2015   [facility], Dr. MJerelene Redden   TEE WITHOUT CARDIOVERSION N/A 04/26/2021   Procedure: TRANSESOPHAGEAL ECHOCARDIOGRAM (TEE);  Surgeon: HPixie Casino MD;  [facility];  Service: Cardiovascular;  Laterality: N/A;    Current Medications: Current Meds  Medication Sig   albuterol (PROVENTIL HFA;VENTOLIN HFA) 108 (90 Base) MCG/ACT inhaler Inhale 2 puffs into the lungs every 6 (six) hours as needed  for wheezing or shortness of breath.   amiodarone (PACERONE) 200 MG tablet Take 1 tablet (200 mg total) by mouth 2 (two) times daily.   B Complex-C (SUPER B COMPLEX PO) Take 1 capsule by mouth daily.   Cholecalciferol (VITAMIN D) 50 MCG (2000 UT) tablet Take 2,000 Units by mouth daily.   docusate sodium (COLACE) 100 MG capsule Take 100 mg by mouth 2 (two) times daily.   DULoxetine (CYMBALTA) 60 MG capsule Take 60 mg by mouth daily.    famotidine (PEPCID) 20 MG tablet Take 10 mg by mouth daily.   ferrous sulfate 325 (65 FE) MG tablet Take 325 mg by mouth daily with breakfast.   fluticasone (FLONASE) 50 MCG/ACT nasal spray Place 1 spray into both nostrils daily as needed for allergies.   fluticasone-salmeterol (ADVAIR HFA) 45-21 MCG/ACT inhaler Inhale 1 puff into the lungs 2 (two) times daily.    gabapentin (NEURONTIN) 100 MG capsule Take by mouth.   glucose blood test strip 1 each by Other route as needed for other. Use as instructed   HYDROcodone-acetaminophen (NORCO/VICODIN) 5-325 MG tablet Take 1 tablet by mouth every 6 (six) hours as needed for moderate pain.   hydrocortisone cream 1 % Apply 1 application  topically daily as needed for itching.   Insulin Pen Needle (PEN NEEDLES 3/16") 31G X 5 MM MISC Use with insulin pen daily   isosorbide mononitrate (IMDUR) 30 MG 24 hr tablet TAKE 1 TABLET BY MOUTH EVERY DAY   LANTUS SOLOSTAR 100 UNIT/ML Solostar Pen Inject 58 Units into the skin daily.   metolazone (ZAROXOLYN) 2.5 MG tablet Take one tablet once daily as needed for a weight over 255 pounds. Do not take more than 3 days a week.   mirtazapine (REMERON) 7.5 MG tablet Take 7.5 mg by mouth at bedtime.   nitroGLYCERIN (NITROSTAT) 0.4 MG SL tablet Place 0.4 mg under the tongue every 5 (five) minutes as needed for chest pain.   polyethylene glycol powder (GLYCOLAX/MIRALAX) 17 GM/SCOOP powder Take 1 Container by mouth daily.   potassium chloride SA (KLOR-CON) 20 MEQ tablet TAKE 2 TABLETS BY MOUTH THREE TIMES A DAY   rifampin (RIFADIN) 300 MG capsule Take 600 mg by mouth daily.   sucralfate (CARAFATE) 1 g tablet Take 1 g by mouth 2 (two) times daily.   TECHLITE PEN NEEDLES 31G X 5 MM MISC USE WITH INSULIN PEN DAILY   tiZANidine (ZANAFLEX) 2 MG tablet Take 2 mg by mouth 2 (two) times daily as needed for muscle spasms.   warfarin (COUMADIN) 5 MG tablet Take 5 mg by mouth every evening.     Allergies:   Hydrochlorothiazide, Amoxicillin-pot clavulanate, Clindamycin, Lisinopril, Meloxicam, Topiramate, and Tamsulosin   Social History   Socioeconomic History   Marital status: Married    Spouse name: Not on file   Number of children: Not on file   Years of education: Not on file   Highest education level: Not on file  Occupational History   Not on file  Tobacco Use   Smoking status: Some  Days    Packs/day: 0.50    Years: 40.00    Total pack years: 20.00    Types: Cigarettes   Smokeless tobacco: Never  Substance and Sexual Activity   Alcohol use: No    Alcohol/week: 0.0 standard drinks of alcohol   Drug use: No   Sexual activity: Not Currently  Other Topics Concern   Not on file  Social History Narrative   Not on  file   Social Determinants of Health   Financial Resource Strain: Not on file  Food Insecurity: Not on file  Transportation Needs: Not on file  Physical Activity: Not on file  Stress: Not on file  Social Connections: Not on file     Family History: The patient's family history includes Hyperlipidemia in his father; Hypertension in his father.  ROS:   Please see the history of present illness.     All other systems reviewed and are negative.  EKGs/Labs/Other Studies Reviewed:    The following studies were reviewed today:  TEE 04/26/2021  1. Left ventricular ejection fraction, by estimation, is 55 to 60%. The  left ventricle has normal function. There is moderate left ventricular  hypertrophy.   2. Right ventricular systolic function is mildly reduced. The right  ventricular size is normal.   3. Left atrial size was mildly dilated. No left atrial/left atrial  appendage thrombus was detected.   4. 2D/3D visualization. The mitral valve has been repaired/replaced.  Trivial mitral valve regurgitation. No evidence of mitral stenosis. The  mean mitral valve gradient is 4.0 mmHg with average heart rate of 87 bpm.  There is a 31 mm Edwards bioprosthetic   valve present in the mitral position. Procedure Date: 06/12/2013. Echo  findings are consistent with normal structure and function of the mitral  valve prosthesis.   5. The tricuspid valve is abnormal. Tricuspid valve regurgitation is mild  to moderate.   6. The aortic valve is tricuspid. There is mild calcification of the  aortic valve. There is moderate thickening of the aortic valve. Aortic   valve regurgitation is moderate. Mild aortic valve stenosis. Aortic  regurgitation PHT measures 359 msec.   Comparison(s): 03/17/2021: LVEF 55-60%, MV gradient 6 mmHg, moderate to  severe AI, mild AS.   EKG:  EKG is ordered today.  The ekg ordered today demonstrates: Sinus rhythm, left bundle branch block and first-degree AV block.  Recent Labs: 03/15/2021: ALT 31; BNP 91.9; TSH 3.710 04/22/2021: BUN 75; Creatinine, Ser 3.55; Hemoglobin 15.8; Platelets 238; Potassium 3.5; Sodium 135  Recent Lipid Panel    Component Value Date/Time   CHOL 108 (L) 03/10/2016 0925   TRIG 228 (H) 03/10/2016 0925   HDL 28 (L) 03/10/2016 0925   CHOLHDL 3.9 03/10/2016 0925   VLDL 46 (H) 03/10/2016 0925   LDLCALC 34 03/10/2016 0925     Risk Assessment/Calculations:    CHA2DS2-VASc Score = 4   This indicates a 4.8% annual risk of stroke. The patient's score is based upon: CHF History: 1 HTN History: 0 Diabetes History: 1 Stroke History: 0 Vascular Disease History: 1 Age Score: 1 Gender Score: 0          Physical Exam:    VS:  BP 122/64   Pulse 87   Ht '5\' 11"'  (1.803 m)   Wt 246 lb (111.6 kg)   SpO2 98%   BMI 34.31 kg/m     Wt Readings from Last 3 Encounters:  11/22/21 246 lb (111.6 kg)  10/31/21 244 lb 12.8 oz (111 kg)  07/18/21 262 lb 3.2 oz (118.9 kg)     GEN:  Well nourished, well developed in no acute distress HEENT: Normal NECK: No JVD; No carotid bruits LYMPHATICS: No lymphadenopathy CARDIAC: RRR, no murmurs, rubs, gallops RESPIRATORY:  Clear to auscultation without rales, wheezing or rhonchi  ABDOMEN: Soft, non-tender, non-distended MUSCULOSKELETAL:  No edema; No deformity  SKIN: Warm and dry NEUROLOGIC:  Alert and oriented x 3  PSYCHIATRIC:  Normal affect   ASSESSMENT:    1. Bacteremia   2. PAF (paroxysmal atrial fibrillation) ([place])   3. Chronic combined systolic and diastolic heart failure ([place])   4. History of mitral valve replacement with bioprosthetic valve    5. Coronary artery disease involving native coronary artery of native heart without angina pectoris   6. Chronic kidney disease (CKD), stage V ([place])   7. Chronic obstructive pulmonary disease, unspecified COPD type ([place])   8. Hyperlipidemia LDL goal <70   9. Controlled type 2 diabetes mellitus without complication, without long-term current use of insulin ([place])    PLAN:    In order of problems listed above:  Bacteremia: Patient was recently admitted to [facility] due to bacteremia.  Echocardiogram at [facility] suggested possible endocarditis, [facility]  However TEE obtained at [facility] did not show any vegetation on the mitral valve, however it did reveal fibrinous material seen on the device leads not seen on the previous imaging.  CT surgery and EP service was involved at [facility] and he eventually decided to take out his CRT-D device.  Previous ICD site is clean, dry, without sign of infection.  Discharge paperwork from [facility] suggested repeat TEE once he finished antibiotic.  PAF: Continue amiodarone and Coumadin  Chronic combined systolic and diastolic heart failure: Euvolemic on exam  History of bioprosthetic MVR: Stable on recent TEE obtained at [facility].  Although there was sign of vegetation based on TTE obtained at the [facility] [place], however subsequent TEE at [facility] did not confirm any vegetation.  CAD: Denies any recent chest pain  CKD stage IV: Stable renal function  COPD: No acute exacerbation  Hyperlipidemia: On Lipitor  DM2: Managed by primary care provider.           Medication Adjustments/Labs and Tests Ordered: Current medicines are reviewed at length with the patient today.  Concerns regarding medicines are outlined above.  Orders Placed This Encounter  Procedures   EKG 12-Lead   No orders of the defined types were placed in this encounter.   Patient Instructions  Medication Instructions:  Your  physician recommends that you continue on your current medications as directed. Please refer to the Current Medication list given to you today.  *If you need a refill on your cardiac medications before your next appointment, please call your pharmacy*  Lab Work: NONE ordered at this time of appointment   If you have labs (blood work) drawn today and your tests are completely normal, you will receive your results only by: [place] (if you have MyChart) OR A paper copy in the mail If you have any lab test that is abnormal or we need to change your treatment, we will call you to review the results.  Testing/Procedures: NONE ordered at this time of appointment   Follow-Up: At [facility], you and your health needs are our priority.  As part of our continuing mission to provide you with exceptional heart care, we have created designated Provider Care Teams.  These Care Teams include your primary Cardiologist (physician) and Advanced Practice Providers (APPs -  Physician Assistants and Nurse Practitioners) who all work together to provide you with the care you need, when you need it.  We recommend signing up for the patient portal called "MyChart".  Sign up information is provided on this After Visit Summary.  MyChart is used to connect with patients for Virtual Visits (Telemedicine).  Patients are  able to view lab/test results, encounter notes, upcoming appointments, etc.  Non-urgent messages can be sent to your provider as well.   To learn more about what you can do with MyChart, go to [email or web address].    Your next appointment:   1 month(s)  The format for your next appointment:   In Person  Provider:   APP on a day the Dr. Sallyanne [name] is in the office         Other Instructions   Important Information About Sugar         Signed, Almyra Deforest, [place]  11/24/2021 11:01 PM    [place]

## 2021-11-24 ENCOUNTER — Encounter: Payer: Self-pay | Admitting: Physician Assistant

## 2022-01-02 ENCOUNTER — Ambulatory Visit (INDEPENDENT_AMBULATORY_CARE_PROVIDER_SITE_OTHER): Payer: Medicare Other | Admitting: Cardiovascular Disease

## 2022-01-02 ENCOUNTER — Encounter: Payer: Medicare Other | Admitting: Cardiovascular Disease

## 2022-01-02 ENCOUNTER — Encounter: Payer: Self-pay | Admitting: Cardiovascular Disease

## 2022-01-02 VITALS — BP 108/58 | HR 86 | Ht 71.5 in | Wt 251.2 lb

## 2022-01-02 DIAGNOSIS — Z953 Presence of xenogenic heart valve: Secondary | ICD-10-CM

## 2022-01-02 DIAGNOSIS — I351 Nonrheumatic aortic (valve) insufficiency: Secondary | ICD-10-CM | POA: Diagnosis not present

## 2022-01-02 DIAGNOSIS — J449 Chronic obstructive pulmonary disease, unspecified: Secondary | ICD-10-CM

## 2022-01-02 DIAGNOSIS — Z79899 Other long term (current) drug therapy: Secondary | ICD-10-CM

## 2022-01-02 DIAGNOSIS — I330 Acute and subacute infective endocarditis: Secondary | ICD-10-CM

## 2022-01-02 DIAGNOSIS — Z5181 Encounter for therapeutic drug level monitoring: Secondary | ICD-10-CM

## 2022-01-02 DIAGNOSIS — I10 Essential (primary) hypertension: Secondary | ICD-10-CM

## 2022-01-02 DIAGNOSIS — I5042 Chronic combined systolic (congestive) and diastolic (congestive) heart failure: Secondary | ICD-10-CM | POA: Diagnosis not present

## 2022-01-02 DIAGNOSIS — D6869 Other thrombophilia: Secondary | ICD-10-CM

## 2022-01-02 DIAGNOSIS — B958 Unspecified staphylococcus as the cause of diseases classified elsewhere: Secondary | ICD-10-CM

## 2022-01-02 DIAGNOSIS — I480 Paroxysmal atrial fibrillation: Secondary | ICD-10-CM

## 2022-01-02 DIAGNOSIS — I350 Nonrheumatic aortic (valve) stenosis: Secondary | ICD-10-CM

## 2022-01-02 DIAGNOSIS — E119 Type 2 diabetes mellitus without complications: Secondary | ICD-10-CM

## 2022-01-02 DIAGNOSIS — I2510 Atherosclerotic heart disease of native coronary artery without angina pectoris: Secondary | ICD-10-CM

## 2022-01-02 DIAGNOSIS — I872 Venous insufficiency (chronic) (peripheral): Secondary | ICD-10-CM

## 2022-01-02 NOTE — Patient Instructions (Signed)
Medication Instructions:  No changes *If you need a refill on your cardiac medications before your next appointment, please call your pharmacy*   Lab Work: None ordered If you have labs (blood work) drawn today and your tests are completely normal, you will receive your results only by: [place] (if you have MyChart) OR A paper copy in the mail If you have any lab test that is abnormal or we need to change your treatment, we will call you to review the results.   Testing/Procedures: Your physician has requested that you have an echocardiogram. Echocardiography is a painless test that uses sound waves to create images of your heart. It provides your doctor with information about the size and shape of your heart and how well your heart's chambers and valves are working. You may receive an ultrasound enhancing agent through an IV if needed to better visualize your heart during the echo.This procedure takes approximately one hour. There are no restrictions for this procedure. This will take place at the [address], [address].   Follow-Up: At [facility], you and your health needs are our priority.  As part of our continuing mission to provide you with exceptional heart care, we have created designated Provider Care Teams.  These Care Teams include your primary Cardiologist (physician) and Advanced Practice Providers (APPs -  Physician Assistants and Nurse Practitioners) who all work together to provide you with the care you need, when you need it.  We recommend signing up for the patient portal called "MyChart".  Sign up information is provided on this After Visit Summary.  MyChart is used to connect with patients for Virtual Visits (Telemedicine).  Patients are able to view lab/test results, encounter notes, upcoming appointments, etc.  Non-urgent messages can be sent to your provider as well.   To learn more about what you can do with MyChart, go to [email or web address].     Your next appointment:   3 month(s)  The format for your next appointment:   In Person  Provider:   [name], MD {   Important Information About [place]

## 2022-01-02 NOTE — Progress Notes (Signed)
`   Cardiology Office Note    Date:  01/02/2022   ID:  Dominic [name], DOB 03/24/1951, MRN [id number]  PCP:  Dominic Allegra, FNP  Cardiologist:   Dominic Klein, MD   No chief complaint on file.   History of Present Illness:  Dominic [name] is a 71 y.o. 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  LV pacing vector is LV ring 3 to can), moderate aortic insufficiency, moderate to severe COPD, moderate to severe chronic kidney disease, type 2 diabetes mellitus complicated by nephropathy and neuropathy, history of postoperative atrial fibrillation (that has not been subsequently seen on his defibrillator checks), recent episode of CHF exacerbation and AFib.  He was hospitalized to [facility] on 09/27/2021 with sepsis and acute on chronic hypoxic respiratory failure and found to have MRSA bacteremia. He had persistent blood cultures despite treatment with vancomycin and cefepime and was subsequently switched to daptomycin and ceftaroline. There was suspicion of small vegetations on the mitral valve bioprosthesis, RV lead and tricuspid valve.  He received a tunneled right subclavian central venous catheter has been on IV daptomycin which he is due to continue for another couple of weeks, until 11/12/2021.  He is also receiving rifampin and some of his other medications have been temporarily stopped due to drug interactions ( off his Lipitor and Eliquis and is on warfarin; also off gabapentin, Topamax, Cymbalta).  After completion of the 6 weeks of antibiotics intravenously he is planning to go on chronic suppression with minocycline 100 mg twice daily.  He admitted and had a follow-up TEE performed on 10/14/2021 at [facility] that did not show any evidence of vegetations on the prosthetic mitral valve or on the pacemaker leads and showed moderate aortic  insufficiency.  The mean mitral valve gradient was 6 mmHg at a heart rate of 92 bpm.   TTE 11/06/2021 showed LVEF 55-60%, moderate aortic stenosis, moderate-severe aortic insufficiency, mean MVR gradient 6 mm Hg at 79 bpm.  Nevertheless, he underwent laser lead and generator extraction of the BiV-ICD on 11/17/2021 by Dr. Romona [name]. Blood cultures 12/28/2021 at [facility] - no growth. Tunneled catheter was removed 12/22/2021.  It has now been about 6 weeks since he lost the benefit of CRT.  Thankfully there has been no overt heart failure decompensation.  During his prolonged illness with endocarditis he lost a lot of weight sent had a nadir of 238 pounds.  Since returning home his appetite has improved and he is slowly gaining weight.  He has chronic lower extremity edema in the left lower extremity following an episode of cellulitis, but does not have any swelling in his right leg at this time.  He always sleeps in a recliner.  It is hard to say, but he may be describing some episodes of paroxysmal nocturnal dyspnea.  He has chronic NYHA functional class III exertional dyspnea.  He denies dizziness, palpitations, syncope, chest pain at rest or with activity or any focal neurological events.  Unfortunately continues to smoke about a pack of cigarettes every day.   His TEE on 04/26/2021 was interpreted as showing moderate AI. Transvalvular gradients were not reported, but the mean gradient was 20 mm Hg on TTE in October. The AI PHT was 350 ms (comparable to 326 ms on TTE). LVOT diameter on the TTE was overestimated at 2.8 cm. DVI was 0.39  - It  is very challenging to measure his LVOT accurately due to shadowing from the mitral prosthetic annulus.  I think the LVOT actually measures around 2.3 cm, adjusting for this the actual calculated aortic valve area would be approximately 1.6 cm, consistent with mild-moderate aortic stenosis and moderate to severe AI.  Morphologically, he has a 3 leaflet aortic valve  with moderate thickening and moderate calcification.  The noncoronary cusp on the left coronary cusp look relatively restrictive and motion.  I think there is enough degenerative change that his aortic valve could hold a TAVR stent.    For the first time in years he required hospitalization for congestive heart failure on 02/14/2021.  On presentation he was found to be in atrial fibrillation with rapid ventricular response.  He reports beginning to feel unwell 1 or 2 days before his hospitalization, with worsening exertional dyspnea.  He was not aware of any palpitations, but on arrival to the emergency room he had atrial fibrillation with RVR that did not respond to treatment with intravenous amiodarone and metoprolol so that he was eventually cardioverted with a single shock at 200 J on the day of admission.  He was started on Eliquis.  The notes from [facility] state that he was evaluated by nephrology while in the hospital and that his renal function was stable.  He has not had angina pectoris since the addition of isosorbide mononitrate.  Nuclear stress test in April 2021 showed a small area of lateral wall ischemia.  Noninvasive/conservative approach recommended due to his renal failure.  His last coronary angiogram was performed in 2016 when he received 2 stents.  Mr. Dominic [name] had severe ischemic cardiomyopathy with left ventricular ejection fraction estimated to be approximately 30% by echocardiogram performed in October 2016. That echo describes septal and apical akinesis with global hypokinesis. He had evidence of restrictive filling as well as moderate right ventricular systolic dysfunction and moderate aortic insufficiency. Echo in May 2017 (after PCI and CRT) shows improved LVEF 45-50% and normal function of the mitral valve prosthesis. Right heart catheterization in May 2017 showed right atrial pressure 8, mean wedge pressure 6, cardiac index 2.2 L/minute.   He has an extensive history of  coronary artery disease. He tells me that he thinks he has had 9 cardiac catheterizations and 5 stents. At least on one admission he had 2 episodes of cardiac arrest. I think this was during his hospitalization for bypass surgery/valve replacement. He underwent bypass surgery in [place] in 2015 with synchronous mitral valve replacement with a biological prosthesis. He has a history of previous stent placed in the LAD artery, type of stent and date of procedure unknown. He underwent placement of a bare metal stent to the ostium of the left circumflex coronary artery in September 2015. He subsequently underwent placement of 2 drug-eluting stents at [facility] in October 2016, placed in the right coronary artery and left circumflex coronary artery respectively. At the time of that cardiac catheterization he had 50% stenosis in the distal left main, 90% proximal LAD, 70% in-stent restenosis proximal LAD, patent LIMA to distal LAD, 80% RCA. No mention is made of his second bypass graft, which I presume is occluded. He is on combination aspirin/clopidogrel.  At the time of his bypass surgery received a 31 mm [employer] mitral bioprosthetic valve. By echocardiogram last October 2016 the mean transvalvular gradient was 5 mmHg. The same echocardiogram described a trileaflet unrestricted aortic valve with moderate insufficiency.  His chart describes a  history of postoperative atrial fibrillation. Amiodarone was stopped after several years without Afib, but this recurred in September 2022. Amiodarone and anticoagulation were restarted.  He has a history of left bundle branch block and he received a CRT-D [employer] device on 07/01/2015. The device and the leads are MRI conditional.  Following an episode of MRSA bacteremia his biventricular defibrillator generator and leads were all extracted on 11/17/2021.  He completed 6 weeks of intravenous antibiotics and had his tunneled catheter removed  in early July 2023.  Blood cultures performed on 12/28/2021 were all negative.  Transesophageal and transthoracic echocardiograms performed in late May during MRSA bacteremia did not show any evidence of mitral valve prosthesis endocarditis and showed unchanged aortic stenosis and aortic insufficiency.  He has advanced chronic kidney disease. With recent baseline creatinine around 3.0-3.5. During his hospitalization in January 2017 the peak creatinine reached 4.6 (acute diarrheal illness), reaching a similar level in August 2017 when he had nausea and vomiting.[name] He has type 2 diabetes mellitus (not requiring insulin), complicated by kidney disease and neuropathy.  He takes atorvastatin in a relatively low dose. He has long-standing treated hypertension. He is a former smoker and has a history of COPD and takes bronchodilators. He has a history of migraine headaches treated with Fioricet. He underwent sigmoid colectomy in November 2017 for colon cancer.  Past Medical History:  Diagnosis Date   Biventricular ICD (implantable cardioverter-defibrillator) - [facility] 09/05/2015   Jul 01, 2015 [facility] Dynagen X4   CAD (coronary artery disease) 09/05/2015   CABG 2015 PCI-BMS ostial LCX Sept 2015 04/05/2015 cath 50% distal LM, 90% prox LAD, old stent prox LAD 70% ISR, patent LIMA-LAD, 80% prox RCA PCI-DES x2 RCA and LCX    Chronic systolic CHF (congestive heart failure) ([place]) 09/05/2015   a. [place] 10/29/15: PCW 8, CI 2.2. Overall low filling presssures. Mean PA 20; b. echo 10/30/15: EF 45-50%, not tech suff to allwo for LV diastolic fxn, mild AI, nl appearing MVR   CKD stage 4 due to type 2 diabetes mellitus ([place]) 09/05/2015   Diabetes mellitus, type 2 ([place]) 09/05/2015   Essential hypertension 09/05/2015   History of mitral valve replacement with bioprosthetic valve 09/05/2015   31 mm Edwards 2015, Dr. Jerelene Redden   Postoperative atrial fibrillation ([place]) 09/05/2015    Past Surgical History:  Procedure Laterality Date   CARDIAC  CATHETERIZATION N/A 10/29/2015   Procedure: Right Heart Cath;  Surgeon: Jolaine Artist, MD;  Location: [place] [facility];  Service: Cardiovascular;  Laterality: N/A;   CARDIAC VALVE REPLACEMENT  2015   62m Edwards bioprosthesis   CORONARY ANGIOPLASTY     CORONARY ARTERY BYPASS GRAFT  2015   [facility], Dr. MJerelene Redden   TEE WITHOUT CARDIOVERSION N/A 04/26/2021   Procedure: TRANSESOPHAGEAL ECHOCARDIOGRAM (TEE);  Surgeon: HPixie Casino MD;  [facility];  Service: Cardiovascular;  Laterality: N/A;    Current Medications: Outpatient Medications Prior to Visit  Medication Sig Dispense Refill   amiodarone (PACERONE) 200 MG tablet Take 1 tablet (200 mg total) by mouth 2 (two) times daily. 90 tablet 3   apixaban (ELIQUIS) 5 MG TABS tablet Take 5 mg by mouth in the morning and at bedtime.     atorvastatin (LIPITOR) 40 MG tablet Take 40 mg by mouth daily.     B Complex-C (SUPER B COMPLEX PO) Take 1 capsule by mouth daily.     Cholecalciferol (VITAMIN D) 50 MCG (2000 UT) tablet Take 2,000 Units by mouth daily.  docusate sodium (COLACE) 100 MG capsule Take 100 mg by mouth 2 (two) times daily.     DULoxetine (CYMBALTA) 60 MG capsule Take 60 mg by mouth daily.      famotidine (PEPCID) 20 MG tablet Take 10 mg by mouth daily.     ferrous sulfate 325 (65 FE) MG tablet Take 325 mg by mouth daily with breakfast.     fluticasone (FLONASE) 50 MCG/ACT nasal spray Place 1 spray into both nostrils daily as needed for allergies.     fluticasone-salmeterol (ADVAIR HFA) 45-21 MCG/ACT inhaler Inhale 1 puff into the lungs 2 (two) times daily.     glucose blood test strip 1 each by Other route as needed for other. Use as instructed     hydrocortisone cream 1 % Apply 1 application  topically daily as needed for itching.     Insulin Pen Needle (PEN NEEDLES 3/16") 31G X 5 MM MISC Use with insulin pen daily     isosorbide mononitrate (IMDUR) 30 MG 24 hr tablet TAKE 1 TABLET BY MOUTH EVERY DAY 90 tablet 2    LANTUS SOLOSTAR 100 UNIT/ML Solostar Pen Inject 58 Units into the skin daily.     metolazone (ZAROXOLYN) 2.5 MG tablet Take one tablet once daily as needed for a weight over 255 pounds. Do not take more than 3 days a week. 4 tablet 6   metoprolol succinate (TOPROL-XL) 25 MG 24 hr tablet Take by mouth.     mirtazapine (REMERON) 7.5 MG tablet Take 7.5 mg by mouth at bedtime.     polyethylene glycol powder (GLYCOLAX/MIRALAX) 17 GM/SCOOP powder Take 1 Container by mouth daily.     potassium chloride SA (KLOR-CON) 20 MEQ tablet TAKE 2 TABLETS BY MOUTH THREE TIMES A DAY 180 tablet 5   sucralfate (CARAFATE) 1 g tablet Take 1 g by mouth 2 (two) times daily.     TECHLITE PEN NEEDLES 31G X 5 MM MISC USE WITH INSULIN PEN DAILY     tiZANidine (ZANAFLEX) 2 MG tablet Take 2 mg by mouth 2 (two) times daily as needed for muscle spasms.     torsemide (DEMADEX) 20 MG tablet Take 20 mg by mouth in the morning and at bedtime. Take 2 tablets twice daily     albuterol (PROVENTIL HFA;VENTOLIN HFA) 108 (90 Base) MCG/ACT inhaler Inhale 2 puffs into the lungs every 6 (six) hours as needed for wheezing or shortness of breath. (Patient not taking: Reported on 01/02/2022)     furosemide (LASIX) 40 MG tablet Take 40 mg by mouth 3 (three) times daily. (Patient not taking: Reported on 01/02/2022)     gabapentin (NEURONTIN) 100 MG capsule Take by mouth. (Patient not taking: Reported on 01/02/2022)     glipiZIDE (GLUCOTROL XL) 10 MG 24 hr tablet Take 10 mg by mouth daily. (Patient not taking: Reported on 01/02/2022)     HYDROcodone-acetaminophen (NORCO/VICODIN) 5-325 MG tablet Take 1 tablet by mouth every 6 (six) hours as needed for moderate pain. (Patient not taking: Reported on 01/02/2022)     nitroGLYCERIN (NITROSTAT) 0.4 MG SL tablet Place 0.4 mg under the tongue every 5 (five) minutes as needed for chest pain. (Patient not taking: Reported on 01/02/2022)     rifampin (RIFADIN) 300 MG capsule Take 600 mg by mouth daily. (Patient not  taking: Reported on 01/02/2022)     warfarin (COUMADIN) 5 MG tablet Take 5 mg by mouth every evening. (Patient not taking: Reported on 01/02/2022)     No facility-administered medications  prior to visit.     Allergies:   Hydrochlorothiazide, Amoxicillin-pot clavulanate, Clindamycin, Lisinopril, Meloxicam, Topiramate, and Tamsulosin   Social History   Socioeconomic History   Marital status: Married    Spouse name: Not on file   Number of children: Not on file   Years of education: Not on file   Highest education level: Not on file  Occupational History   Not on file  Tobacco Use   Smoking status: Some Days    Packs/day: 0.50    Years: 40.00    Total pack years: 20.00    Types: Cigarettes   Smokeless tobacco: Never   Tobacco comments:    01/02/2022- Patient smokes about 1/2 half pack daily  Substance and Sexual Activity   Alcohol use: No    Alcohol/week: 0.0 standard drinks of alcohol   Drug use: No   Sexual activity: Not Currently  Other Topics Concern   Not on file  Social History Narrative   Not on file   Social Determinants of Health   Financial Resource Strain: Not on file  Food Insecurity: Not on file  Transportation Needs: Not on file  Physical Activity: Not on file  Stress: Not on file  Social Connections: Not on file     Family History:  The patient's family history includes Hyperlipidemia in his father; Hypertension in his father.   ROS:   Please see the history of present illness.    ROS All other systems reviewed and are negative.   PHYSICAL EXAM:   VS:  BP (!) 108/58 (BP Location: Left Arm, Patient Position: Sitting, Cuff Size: Large)   Pulse 86   Ht 5' 11.5" (1.816 m)   Wt 251 lb 3.2 oz (113.9 kg)   SpO2 93%   BMI 34.55 kg/m      General: Alert, oriented x3, no distress, severely obese.  The left subclavian pacemaker extraction site has healed very well with a small healthy appearing scar Head: no evidence of trauma, PERRL, EOMI, no  exophtalmos or lid lag, no myxedema, no xanthelasma; normal ears, nose and oropharynx Neck: normal jugular venous pulsations and no hepatojugular reflux; brisk carotid pulses without delay and no carotid bruits Chest: clear to auscultation, no signs of consolidation by percussion or palpation, normal fremitus, symmetrical and full respiratory excursions Cardiovascular: normal position and quality of the apical impulse, regular rhythm, normal first and second heart sounds, no murmurs, rubs or gallops Abdomen: no tenderness or distention, no masses by palpation, no abnormal pulsatility or arterial bruits, normal bowel sounds, no hepatosplenomegaly Extremities: no clubbing, cyanosis or edema; 2+ radial, ulnar and brachial pulses bilaterally; 2+ right femoral, posterior tibial and dorsalis pedis pulses; 2+ left femoral, posterior tibial and dorsalis pedis pulses; no subclavian or femoral bruits Neurological: grossly nonfocal Psych: Normal mood and affect    Wt Readings from Last 3 Encounters:  01/02/22 251 lb 3.2 oz (113.9 kg)  11/22/21 246 lb (111.6 kg)  10/31/21 244 lb 12.8 oz (111 kg)    Studies/Labs Reviewed:   11/08/21 TEE SUMMARY Left ventricular systolic function is normal.  The right ventricle is normal size.  Device lead in the right ventricle  There is fibrinous material noted on the device leads, not seen on  prior.  No thrombus is detected in the left atrial appendage.  Planimetry of the aortic valve in 2D/3D with a planimetred valve area  of 1.36 cm2, consistent with moderate aortic stenosis. There is mild  to moderate aortic regurgitation.  No  evidence of vegetation on the aortic valve  There is a bioprosthetic mitral valve replacement (mean PG 3.7 mm Hg  at a HR 63). Notably, there is a prominent suture line on the lateral  aspect of the valve imaged at 0, 45, 90, and 135 degrees. There is no  evidence of valve degradation or peri-valvular leak that would suggest   endocarditis  There is no tricuspid valve vegetation.  There is no vegetation on the pulmonic valve.  There is no pericardial effusion.  Moderate atherosclerotic plaque(s) in the descending aorta.   In comparison to prior study dated 10/14/21, there is fibrinous material  seen on the device leads, not seen on prior imaging. In the correct  clinical context this could represent a device infection.       Echo 11/06/2021  The resolution of this study is inadequate for r/o endocardiitis  The left ventricle is mildly dilated.  Mild left ventricular hypertrophy.  LV ejection fraction = 55-60%.  Left ventricular systolic function is normal.  Left ventricular filling pattern is prolonged relaxation.  The right ventricle is normal in size and function.  Device lead in the right ventricle  The left atrium is moderately to severely dilated.  The right atrium is mildly dilated.  There is moderate aortic stenosis( likely overestimated due to  signifcant regurgitation).  There is moderate to severe aortic regurgitation.  There is a prosthetic mitral valve.  The mean gradient across the mitral valve is 6.2 mmHg.  The heart rate for the mean mitral valve gradient is 79 BPM.  There is mild tricuspid regurgitation.  The inferior vena cava was not visualized during the exam.  There is no pericardial effusion.   -    Myoview 10/10/2019 Nuclear stress EF: 49%. The left ventricular ejection fraction is mildly decreased (45-54%). Defect 1: There is a small defect of mild severity present in the basal anterolateral, mid anterolateral and apical lateral location. This is an intermediate risk study.   1. Reversible basal to apical anterolateral perfusion defect consistent with ischemia. 2. Septal hypokinesis, which may be due to paced rhythm  EKG:  EKG is ordered today.  It shows atrial sensed (sinus), biventricular paced rhythm with a positive are in lead V1 but a broad QRS of 168 ms.  QTc 558  ms  Recent Labs: 03/15/2021: ALT 31; BNP 91.9; TSH 3.710 04/22/2021: BUN 75; Creatinine, Ser 3.55; Hemoglobin 15.8; Platelets 238; Potassium 3.5; Sodium 135  12/22/2021 creatinine 3.26, potassium 3.4, hemoglobin 11.2, WBC 5.7, platelets 229  Lipid Panel    Component Value Date/Time   CHOL 108 (L) 03/10/2016 0925   TRIG 228 (H) 03/10/2016 0925   HDL 28 (L) 03/10/2016 0925   CHOLHDL 3.9 03/10/2016 0925   VLDL 46 (H) 03/10/2016 0925   LDLCALC 34 03/10/2016 0925    05/25/2021 Total cholesterol 130, triglycerides 236, HDL 30, LDL 70  10/18/2021 hemoglobin 12.0, WBC 5.9, creatinine 2.82, potassium 3.6, alk phos 116 but otherwise normal LFTs  hemoglobin A1c  8.0% on 10/11/2021  ASSESSMENT:    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 Chronic combined systolic and diastolic heart failure ([place])   3. Aortic valve stenosis, nonrheumatic   4. Nonrheumatic aortic valve insufficiency   5. Chronic obstructive pulmonary disease, unspecified COPD type ([place])   6. PAF (paroxysmal atrial fibrillation) ([place])   7. Encounter for monitoring amiodarone therapy   8. Acquired thrombophilia ([place])   9. Coronary artery disease involving native coronary artery of native heart  without angina pectoris   10. History of mitral valve replacement with bioprosthetic valve   11. Essential hypertension   12. Controlled type 2 diabetes mellitus without complication, without long-term current use of insulin ([place])   13. Peripheral venous insufficiency   14. Severe obesity (BMI 35.0-39.9) with comorbidity ([place])      PLAN:  In order of problems listed above:  Endocarditis: Pacemaker and leads were extracted in their entirety, but the bioprosthetic mitral valve is left in place.  Thankfully he is now been off antibiotics for several weeks without evidence of recurrent endocarditis. CHF: No longer has the benefit of BiV pacing, but EF prior to device explantation was normal at 55%.  Plan to recheck echo now,  without BiV pacing.  NYHA functional class III.  Very hard to know what his dry weight is anymore after his prolonged illness.  Other than chronic left shin edema he does not have any other signs of hypervolemia.  We will recheck his echocardiogram.  Unable to take RAAS inhibitors due to renal dysfunction. He is not a candidate for SGLT2 inhibitors.  On torsemide 40 mg twice daily. AS/AI: By the most recent TEE the aortic insufficiency was described as mild to moderate, but by transthoracic echo performed just 2 days earlier it was moderate to severe.  He is not a candidate for surgical valve replacement due to high risk (previous sternotomy, severe COPD, severe CKD), but I believe the anatomical changes in his valve would allow TAVR.  We will allow several months to pass until this work-up is required and we are sure that he does not have lingering endocarditis.  Work-up for TAVR will be complicated due to renal insufficiency. COPD: Continues to smoke.  Once again strongly recommended smoking cessation.  Clearly a large part of his dyspnea is related to emphysema as his symptoms improved with bronchodilators.  Unfortunately he continues to smoke.  He will always have residual dyspnea due to emphysema.  In the past, even when we achieved euvolemia (even hypovolemia confirmed at right heart catheterization) he still had residual dyspnea and PFTs confirm significant obstructive lung disease.   Paroxysmal atrial fibrillation: None has occurred recently.  On chronic amiodarone.  This was likely the cause for his heart failure exacerbation episode in September 2022, but while on amiodarone therapy has not had any recurrent atrial fibrillation since September.   He is appropriately anticoagulated.  CHA2DS2-VASc 4 (age, HTN, DM, CHF). Amiodarone: Not the most desirable drug in view of his lung problems, but there are no other acceptable antiarrhythmics due to his renal insufficiency and heart failure.  He had normal  liver function tests earlier this month.  Due for recheck TSH. Anticoagulation: He is now back on Eliquis after having been switched to warfarin due to simultaneous treatment with rifampin.  No bleeding problems. CAD: Asymptomatic.  He had a low risk nuclear stress test a year ago.   (Small area of lateral wall ischemia, good response to enhanced antianginal therapy with adding long-acting nitrates).   Continue beta-blockers, long-acting nitrates, high-dose statin.  We will plan coronary angiography only if we decide to go to TAVR. S/p MVR: Most recent TEE did not show any signs of infection or vegetation.  Normal prosthetic valve function.  I thought the plan was for lifelong minocycline suppression therapy, but I do not think he is on that antibiotic at this time.  Needs additional endocarditis prophylaxis with dental procedures.  Recheck echocardiogram now that he no longer has  BiV pacing and has been off antibiotics for several weeks. 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  His nephrologist is Dr. [name]. HTN: Well-controlled on hydralazine/nitrates and beta-blocker.  Avoiding RAAS inhibitors due to renal dysfunction. DM: Seems to be back to baseline.  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 Peripheral venous insufficiency/lymphedema: Very mild swelling at this time, only on the left side.  He has prominent bilateral varicose veins which contribute to peripheral edema, even when he is euvolemic..  This is always more prominent in the left lower extremity where he has had previous cellulitis. Severe obesity: Has lost some weight during the acute illness.  Unsure what his dry weight is anymore.  Medication Adjustments/Labs and Tests Ordered: Current medicines are reviewed at  length with the patient today.  Concerns regarding medicines are outlined above.  Medication changes, Labs and Tests ordered today are listed in the Patient Instructions below. Patient Instructions  Medication Instructions:  No changes *If you need a refill on your cardiac medications before your next appointment, please call your pharmacy*   Lab Work: None ordered If you have labs (blood work) drawn today and your tests are completely normal, you will receive your results only by: [place] (if you have MyChart) OR A paper copy in the mail If you have any lab test that is abnormal or we need to change your treatment, we will call you to review the results.   Testing/Procedures: Your physician has requested that you have an echocardiogram. Echocardiography is a painless test that uses sound waves to create images of your heart. It provides your doctor with information about the size and shape of your heart and how well your heart's chambers and valves are working. You may receive an ultrasound enhancing agent through an IV if needed to better visualize your heart during the echo.This procedure takes approximately one hour. There are no restrictions for this procedure. This will take place at the [address], [address].   Follow-Up: At [facility], you and your health needs are our priority.  As part of our continuing mission to provide you with exceptional heart care, we have created designated Provider Care Teams.  These Care Teams include your primary Cardiologist (physician) and Advanced Practice Providers (APPs -  Physician Assistants and Nurse Practitioners) who all work together to provide you with the care you need, when you need it.  We recommend signing up for the patient portal called "MyChart".  Sign up information is provided on this After Visit Summary.  MyChart is used to connect with patients for Virtual Visits (Telemedicine).  Patients are able to view lab/test  results, encounter notes, upcoming appointments, etc.  Non-urgent messages can be sent to your provider as well.   To learn more about what you can do with MyChart, go to [email or web address].    Your next appointment:   3 month(s)  The format for your next appointment:   In Person  Provider:   [name], MD {   Important Information About Sugar          Signed, Dominic Klein, MD  01/02/2022 4:06 PM    [facility] [place], [place], [place]  [address] Phone: [phone]; Fax: [phone]

## 2022-01-04 ENCOUNTER — Other Ambulatory Visit: Payer: Self-pay | Admitting: Cardiovascular Disease

## 2022-01-23 ENCOUNTER — Ambulatory Visit (HOSPITAL_COMMUNITY): Payer: Medicare Other | Attending: Cardiology

## 2022-01-23 DIAGNOSIS — I350 Nonrheumatic aortic (valve) stenosis: Secondary | ICD-10-CM | POA: Insufficient documentation

## 2022-01-23 DIAGNOSIS — I5042 Chronic combined systolic (congestive) and diastolic (congestive) heart failure: Secondary | ICD-10-CM | POA: Insufficient documentation

## 2022-01-23 LAB — ECHOCARDIOGRAM COMPLETE
AR max vel: 1.22 cm2
AV Area VTI: 1.34 cm2
AV Area mean vel: 1.13 cm2
AV Mean grad: 22 mmHg
AV Peak grad: 36.8 mmHg
Ao pk vel: 3.04 m/s
Area-P 1/2: 3.01 cm2
Calc EF: 46.4 %
MV VTI: 1.92 cm2
P 1/2 time: 362 msec
S' Lateral: 5 cm
Single Plane A2C EF: 44.8 %
Single Plane A4C EF: 47.2 %

## 2022-01-30 ENCOUNTER — Telehealth: Payer: Self-pay | Admitting: *Deleted

## 2022-01-30 NOTE — Telephone Encounter (Signed)
Spoke to the patient concerning his echo results. He stated that for the past 2 days he has had some chest pain at rest. He stated that the pain only lasts 15 seconds and comes and goes. Since the pain started, he has been fatigued and short of breath. This is worse with exertion.   He does not feel like he is in afib. He was not having any chest pain while on the phone.   He stated that he will not go to the ED because he is worn out from going there and does not want to be seen there anymore.

## 2022-01-30 NOTE — Telephone Encounter (Signed)
Patient has been made aware. He will take Torsemide 60 mg twice daily until his appointment on 8/30 with Dr. Sallyanne Kuster.

## 2022-01-30 NOTE — Telephone Encounter (Signed)
In the past he has had chest discomfort when he had heart failure exacerbation. It would not be a surprise for him to have worsening heart failure after removal of his biventricular pacemaker, and his EF has clearly dropped on the echocardiogram. Please have him increase his torsemide dose by 50%.  If he is indeed taking 40 mg twice daily as reported in his med list, he should increase this to 60 mg twice daily.  Please make him an appointment with me or APP in the next 2 weeks.

## 2022-01-30 NOTE — Telephone Encounter (Signed)
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 Thankfully, only mildly reduced left ventricular EF at 45%. 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 Hopefully this is not a sign of involvement of the valve from infection.

## 2022-02-08 ENCOUNTER — Ambulatory Visit: Payer: Medicare Other | Attending: Cardiovascular Disease | Admitting: Cardiovascular Disease

## 2022-02-08 ENCOUNTER — Encounter: Payer: Self-pay | Admitting: Cardiovascular Disease

## 2022-02-08 ENCOUNTER — Ambulatory Visit: Payer: Medicare Other | Admitting: Cardiovascular Disease

## 2022-02-08 VITALS — BP 134/54 | HR 80 | Ht 71.5 in | Wt 253.2 lb

## 2022-02-08 DIAGNOSIS — I25118 Atherosclerotic heart disease of native coronary artery with other forms of angina pectoris: Secondary | ICD-10-CM

## 2022-02-08 DIAGNOSIS — Z953 Presence of xenogenic heart valve: Secondary | ICD-10-CM

## 2022-02-08 DIAGNOSIS — D6869 Other thrombophilia: Secondary | ICD-10-CM

## 2022-02-08 DIAGNOSIS — N184 Chronic kidney disease, stage 4 (severe): Secondary | ICD-10-CM

## 2022-02-08 DIAGNOSIS — I480 Paroxysmal atrial fibrillation: Secondary | ICD-10-CM

## 2022-02-08 DIAGNOSIS — B958 Unspecified staphylococcus as the cause of diseases classified elsewhere: Secondary | ICD-10-CM

## 2022-02-08 DIAGNOSIS — E669 Obesity, unspecified: Secondary | ICD-10-CM

## 2022-02-08 DIAGNOSIS — I5042 Chronic combined systolic (congestive) and diastolic (congestive) heart failure: Secondary | ICD-10-CM

## 2022-02-08 DIAGNOSIS — I447 Left bundle-branch block, unspecified: Secondary | ICD-10-CM | POA: Diagnosis not present

## 2022-02-08 DIAGNOSIS — Z720 Tobacco use: Secondary | ICD-10-CM

## 2022-02-08 DIAGNOSIS — E1169 Type 2 diabetes mellitus with other specified complication: Secondary | ICD-10-CM

## 2022-02-08 DIAGNOSIS — I330 Acute and subacute infective endocarditis: Secondary | ICD-10-CM | POA: Diagnosis not present

## 2022-02-08 DIAGNOSIS — Z79899 Other long term (current) drug therapy: Secondary | ICD-10-CM

## 2022-02-08 DIAGNOSIS — E785 Hyperlipidemia, unspecified: Secondary | ICD-10-CM

## 2022-02-08 DIAGNOSIS — E1122 Type 2 diabetes mellitus with diabetic chronic kidney disease: Secondary | ICD-10-CM

## 2022-02-08 DIAGNOSIS — Z5181 Encounter for therapeutic drug level monitoring: Secondary | ICD-10-CM

## 2022-02-08 DIAGNOSIS — J449 Chronic obstructive pulmonary disease, unspecified: Secondary | ICD-10-CM

## 2022-02-08 DIAGNOSIS — I352 Nonrheumatic aortic (valve) stenosis with insufficiency: Secondary | ICD-10-CM

## 2022-02-08 DIAGNOSIS — I10 Essential (primary) hypertension: Secondary | ICD-10-CM

## 2022-02-08 DIAGNOSIS — I872 Venous insufficiency (chronic) (peripheral): Secondary | ICD-10-CM

## 2022-02-08 MED ORDER — METOPROLOL SUCCINATE ER 25 MG PO TB24: 37.5000 mg | ORAL_TABLET | Freq: Every day | ORAL | 3 refills | Status: DC

## 2022-02-08 NOTE — Patient Instructions (Signed)
Medication Instructions:  Your physician has recommended you make the following change in your medication:  INCREASE: Metoprolol 37.5mg  daily. (1.5 tablets)  *If you need a refill on your cardiac medications before your next appointment, please call your pharmacy*   Lab Work: NONE If you have labs (blood work) drawn today and your tests are completely normal, you will receive your results only by: [place] (if you have MyChart) OR A paper copy in the mail If you have any lab test that is abnormal or we need to change your treatment, we will call you to review the results.   Testing/Procedures: NONE   Follow-Up: At [facility], you and your health needs are our priority.  As part of our continuing mission to provide you with exceptional heart care, we have created designated Provider Care Teams.  These Care Teams include your primary Cardiologist (physician) and Advanced Practice Providers (APPs -  Physician Assistants and Nurse Practitioners) who all work together to provide you with the care you need, when you need it.  We recommend signing up for the patient portal called "MyChart".  Sign up information is provided on this After Visit Summary.  MyChart is used to connect with patients for Virtual Visits (Telemedicine).  Patients are able to view lab/test results, encounter notes, upcoming appointments, etc.  Non-urgent messages can be sent to your provider as well.   To learn more about what you can do with MyChart, go to [email or web address].    Your next appointment:   4 month(s)  The format for your next appointment:   In Person  Provider:   [name], MD

## 2022-02-08 NOTE — Progress Notes (Signed)
`   Cardiology Office Note    Date:  02/08/2022   ID:  Dominic [name], DOB [birth year], MRN [id number]  PCP:  [name], FNP  Cardiologist:   [name], MD   Chief Complaint  Patient presents with   Congestive Heart Failure    History of Present Illness:  Dominic [name] is a 71 y.o. 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  LV pacing vector is LV ring 3 to can), moderate aortic insufficiency, moderate to severe COPD, moderate to severe chronic kidney disease, type 2 diabetes mellitus complicated by nephropathy and neuropathy, history of postoperative atrial fibrillation (that has not been subsequently seen on his defibrillator checks), recent episode of CHF exacerbation and AFib.  He was hospitalized to [place] on 09/27/2021 with sepsis and acute on chronic hypoxic respiratory failure and found to have MRSA bacteremia. He had persistent blood cultures despite treatment with vancomycin and cefepime and was subsequently switched to daptomycin and ceftaroline. There was suspicion of small vegetations on the mitral valve bioprosthesis, RV lead and tricuspid valve.  He received 6 weeks of intravenous daptomycin and oral rifampin.  Hehad a follow-up TEE performed on 10/14/2021 at [facility] that did not show any evidence of vegetations on the prosthetic mitral valve or on the pacemaker leads and showed moderate aortic insufficiency.  The mean mitral valve gradient was 6 mmHg at a heart rate of 92 bpm.  He continued to feel ill, poor appetite and weight loss.  TTE 11/06/2021 showed LVEF 55-60%, moderate aortic stenosis, moderate-severe aortic insufficiency, mean MVR gradient 6 mm Hg at 79 bpm.  He underwent laser lead and generator extraction of the BiV-ICD on 11/17/2021 by Dr. Romona Curls. Blood cultures 12/28/2021 at [facility] - no growth.  Tunneled catheter was removed 12/22/2021.  Its been almost 2 months since his CRT device was extracted.  He has not had overt heart failure decompensation.  His appetite has returned and he no longer feels ill.  He has gained back weight, 15 pounds since his nadir at 238 pounds when he had active endocarditis.  He has not had lower extremity edema (other than chronic mild swelling in the left lower extremity where he has a history of cellulitis).  He denies orthopnea or PND.  He has chronic exertional dyspnea NYHA functional class III, not really changed.  On the other hand, he has developed mild exertional angina.  He noticed this when he was feeding the chickens or walking to the mailbox.  It resolves promptly with rest, and less than 5 minutes.  It is described as a retrosternal heaviness and is reminiscent of the angina that he had before bypass surgery.  Is never been severe enough or lasted long enough for him to take sublingual nitroglycerin.  A couple of years ago in April 2021 he had a nuclear stress test that showed evidence of ischemia in a relatively small patch of the anterolateral wall.  Angina seem to improve after we added isosorbide mononitrate, but he does have occasional "migraine headaches".  His ECG now shows sinus rhythm with first-degree AV block and a very broad LBBB with a QRS duration of 170 ms.  He continues to smoke a pack of cigarettes a day.   His TEE on 04/26/2021 was interpreted as showing moderate AI. Transvalvular gradients were not reported, but the mean gradient was 20 mm Hg  on TTE in October. The AI PHT was 350 ms (comparable to 326 ms on TTE). LVOT diameter on the TTE was overestimated at 2.8 cm. DVI was 0.39  - It is very challenging to measure his LVOT accurately due to shadowing from the mitral prosthetic annulus.  I think the LVOT actually measures around 2.3 cm, adjusting for this the actual calculated aortic valve area would be approximately 1.6 cm, consistent  with mild-moderate aortic stenosis and moderate to severe AI.  Morphologically, he has a 3 leaflet aortic valve with moderate thickening and moderate calcification.  The noncoronary cusp on the left coronary cusp look relatively restrictive and motion.  I think there is enough degenerative change that his aortic valve could hold a TAVR stent.    For the first time in years he required hospitalization for congestive heart failure on 02/14/2021.  On presentation he was found to be in atrial fibrillation with rapid ventricular response.  He reports beginning to feel unwell 1 or 2 days before his hospitalization, with worsening exertional dyspnea.  He was not aware of any palpitations, but on arrival to the emergency room he had atrial fibrillation with RVR that did not respond to treatment with intravenous amiodarone and metoprolol so that he was eventually cardioverted with a single shock at 200 J on the day of admission.  He was started on Eliquis.  The notes from [facility] state that he was evaluated by nephrology while in the hospital and that his renal function was stable.  He has not had angina pectoris since the addition of isosorbide mononitrate.  Nuclear stress test in April 2021 showed a small area of lateral wall ischemia.  Noninvasive/conservative approach recommended due to his renal failure.  His last coronary angiogram was performed in 2016 when he received 2 stents.  Dominic [name] had severe ischemic cardiomyopathy with left ventricular ejection fraction estimated to be approximately 30% by echocardiogram performed in October 2016. That echo describes septal and apical akinesis with global hypokinesis. He had evidence of restrictive filling as well as moderate right ventricular systolic dysfunction and moderate aortic insufficiency. Echo in May 2017 (after PCI and CRT) shows improved LVEF 45-50% and normal function of the mitral valve prosthesis. Right heart catheterization in May 2017 showed right  atrial pressure 8, mean wedge pressure 6, cardiac index 2.2 L/minute.   He has an extensive history of coronary artery disease. He tells me that he thinks he has had 9 cardiac catheterizations and 5 stents. At least on one admission he had 2 episodes of cardiac arrest. I think this was during his hospitalization for bypass surgery/valve replacement. He underwent bypass surgery in [place] in 2015 with synchronous mitral valve replacement with a biological prosthesis. He has a history of previous stent placed in the LAD artery, type of stent and date of procedure unknown. He underwent placement of a bare metal stent to the ostium of the left circumflex coronary artery in September 2015. He subsequently underwent placement of 2 drug-eluting stents at [facility] in October 2016, placed in the right coronary artery and left circumflex coronary artery respectively. At the time of that cardiac catheterization he had 50% stenosis in the distal left main, 90% proximal LAD, 70% in-stent restenosis proximal LAD, patent LIMA to distal LAD, 80% RCA. No mention is made of his second bypass graft, which I presume is occluded. He is on combination aspirin/clopidogrel.  At the time of his bypass surgery received a 31 mm [employer]  mitral bioprosthetic valve. By echocardiogram last October 2016 the mean transvalvular gradient was 5 mmHg. The same echocardiogram described a trileaflet unrestricted aortic valve with moderate insufficiency.  His chart describes a history of postoperative atrial fibrillation. Amiodarone was stopped after several years without Afib, but this recurred in September 2022. Amiodarone and anticoagulation were restarted.  He has a history of left bundle branch block and he received a CRT-D [employer] device on 07/01/2015. The device and the leads are MRI conditional.  Following an episode of MRSA bacteremia his biventricular defibrillator generator and leads were all  extracted on 11/17/2021.  He completed 6 weeks of intravenous antibiotics and had his tunneled catheter removed in early July 2023.  Blood cultures performed on 12/28/2021 were all negative.  Transesophageal and transthoracic echocardiograms performed in late May during MRSA bacteremia did not show any evidence of mitral valve prosthesis endocarditis and showed unchanged aortic stenosis and aortic insufficiency.  He has advanced chronic kidney disease. With recent baseline creatinine around 3.0-3.5. During his hospitalization in January 2017 the peak creatinine reached 4.6 (acute diarrheal illness), reaching a similar level in August 2017 when he had nausea and vomiting.[name] He has type 2 diabetes mellitus (not requiring insulin), complicated by kidney disease and neuropathy.  He takes atorvastatin in a relatively low dose. He has long-standing treated hypertension. He is a former smoker and has a history of COPD and takes bronchodilators. He has a history of migraine headaches treated with Fioricet. He underwent sigmoid colectomy in November 2017 for colon cancer.  Past Medical History:  Diagnosis Date   Biventricular ICD (implantable cardioverter-defibrillator) - [facility] 09/05/2015   Jul 01, 2015 [facility] Dynagen X4   CAD (coronary artery disease) 09/05/2015   CABG 2015 PCI-BMS ostial LCX Sept 2015 04/05/2015 cath 50% distal LM, 90% prox LAD, old stent prox LAD 70% ISR, patent LIMA-LAD, 80% prox RCA PCI-DES x2 RCA and LCX    Chronic systolic CHF (congestive heart failure) ([place]) 09/05/2015   a. [place] 10/29/15: PCW 8, CI 2.2. Overall low filling presssures. Mean PA 20; b. echo 10/30/15: EF 45-50%, not tech suff to allwo for LV diastolic fxn, mild AI, nl appearing MVR   CKD stage 4 due to type 2 diabetes mellitus ([place]) 09/05/2015   Diabetes mellitus, type 2 ([place]) 09/05/2015   Essential hypertension 09/05/2015   History of mitral valve replacement with bioprosthetic valve 09/05/2015   31 mm Edwards 2015, Dr. Jerelene Redden    Postoperative atrial fibrillation ([place]) 09/05/2015    Past Surgical History:  Procedure Laterality Date   CARDIAC CATHETERIZATION N/A 10/29/2015   Procedure: Right Heart Cath;  Surgeon: Jolaine Artist, MD;  Location: [facility];  Service: Cardiovascular;  Laterality: N/A;   CARDIAC VALVE REPLACEMENT  2015   43m Edwards bioprosthesis   CORONARY ANGIOPLASTY     CORONARY ARTERY BYPASS GRAFT  2015   [facility], Dr. MJerelene Redden   TEE WITHOUT CARDIOVERSION N/A 04/26/2021   Procedure: TRANSESOPHAGEAL ECHOCARDIOGRAM (TEE);  Surgeon: HPixie Casino MD;  [facility];  Service: Cardiovascular;  Laterality: N/A;    Current Medications: Outpatient Medications Prior to Visit  Medication Sig Dispense Refill   albuterol (PROVENTIL HFA;VENTOLIN HFA) 108 (90 Base) MCG/ACT inhaler Inhale 2 puffs into the lungs every 6 (six) hours as needed for wheezing or shortness of breath.     amiodarone (PACERONE) 200 MG tablet TAKE 1 TABLET BY MOUTH 2 TIMES A DAY 90 tablet 3   apixaban (ELIQUIS) 5 MG TABS tablet Take  5 mg by mouth in the morning and at bedtime.     atorvastatin (LIPITOR) 40 MG tablet Take 40 mg by mouth daily.     B Complex-C (SUPER B COMPLEX PO) Take 1 capsule by mouth daily.     Cholecalciferol (VITAMIN D) 50 MCG (2000 UT) tablet Take 2,000 Units by mouth daily.     docusate sodium (COLACE) 100 MG capsule Take 100 mg by mouth 2 (two) times daily.     DULoxetine (CYMBALTA) 60 MG capsule Take 60 mg by mouth daily.      famotidine (PEPCID) 20 MG tablet Take 10 mg by mouth daily.     ferrous sulfate 325 (65 FE) MG tablet Take 325 mg by mouth daily with breakfast.     fluticasone (FLONASE) 50 MCG/ACT nasal spray Place 1 spray into both nostrils daily as needed for allergies.     fluticasone-salmeterol (ADVAIR HFA) 45-21 MCG/ACT inhaler Inhale 1 puff into the lungs 2 (two) times daily.     gabapentin (NEURONTIN) 100 MG capsule Take by mouth.     glipiZIDE (GLUCOTROL XL) 10 MG 24 hr tablet  Take 10 mg by mouth daily.     glucose blood test strip 1 each by Other route as needed for other. Use as instructed     hydrocortisone cream 1 % Apply 1 application  topically daily as needed for itching.     Insulin Pen Needle (PEN NEEDLES 3/16") 31G X 5 MM MISC Use with insulin pen daily     isosorbide mononitrate (IMDUR) 30 MG 24 hr tablet TAKE 1 TABLET BY MOUTH EVERY DAY 90 tablet 2   LANTUS SOLOSTAR 100 UNIT/ML Solostar Pen Inject 58 Units into the skin daily.     metolazone (ZAROXOLYN) 2.5 MG tablet Take one tablet once daily as needed for a weight over 255 pounds. Do not take more than 3 days a week. 4 tablet 6   mirtazapine (REMERON) 7.5 MG tablet Take 7.5 mg by mouth at bedtime.     polyethylene glycol powder (GLYCOLAX/MIRALAX) 17 GM/SCOOP powder Take 1 Container by mouth daily.     potassium chloride SA (KLOR-CON) 20 MEQ tablet TAKE 2 TABLETS BY MOUTH THREE TIMES A DAY 180 tablet 5   sucralfate (CARAFATE) 1 g tablet Take 1 g by mouth 2 (two) times daily.     TECHLITE PEN NEEDLES 31G X 5 MM MISC USE WITH INSULIN PEN DAILY     torsemide (DEMADEX) 20 MG tablet Take 3 tablets by mouth 2 (two) times daily.     umeclidinium bromide (INCRUSE ELLIPTA) 62.5 MCG/ACT AEPB Inhale into the lungs.     metoprolol succinate (TOPROL-XL) 25 MG 24 hr tablet Take by mouth.     HYDROcodone-acetaminophen (NORCO/VICODIN) 5-325 MG tablet Take 1 tablet by mouth every 6 (six) hours as needed for moderate pain. (Patient not taking: Reported on 02/08/2022)     Insulin Glargine w/ Trans Port 100 UNIT/ML SOPN INJECT 40 UNITS INTO THE SKIN EVERY MORNING and 30 units nightly. (Patient not taking: Reported on 02/08/2022)     nitroGLYCERIN (NITROSTAT) 0.4 MG SL tablet Place 0.4 mg under the tongue every 5 (five) minutes as needed for chest pain. (Patient not taking: Reported on 02/08/2022)     tiZANidine (ZANAFLEX) 2 MG tablet Take 2 mg by mouth 2 (two) times daily as needed for muscle spasms. (Patient not taking: Reported  on 02/08/2022)     torsemide (DEMADEX) 20 MG tablet Take 20 mg by mouth in the  morning and at bedtime. Take 2 tablets twice daily (Patient not taking: Reported on 02/08/2022)     No facility-administered medications prior to visit.     Allergies:   Hydrochlorothiazide, Amoxicillin-pot clavulanate, Clindamycin, Lisinopril, Meloxicam, Topiramate, and Tamsulosin   Social History   Socioeconomic History   Marital status: Married    Spouse name: Not on file   Number of children: Not on file   Years of education: Not on file   Highest education level: Not on file  Occupational History   Not on file  Tobacco Use   Smoking status: Some Days    Packs/day: 0.50    Years: 40.00    Total pack years: 20.00    Types: Cigarettes   Smokeless tobacco: Never   Tobacco comments:    01/02/2022- Patient smokes about 1/2 half pack daily  Substance and Sexual Activity   Alcohol use: No    Alcohol/week: 0.0 standard drinks of alcohol   Drug use: No   Sexual activity: Not Currently  Other Topics Concern   Not on file  Social History Narrative   Not on file   Social Determinants of Health   Financial Resource Strain: Not on file  Food Insecurity: Not on file  Transportation Needs: Not on file  Physical Activity: Not on file  Stress: Not on file  Social Connections: Not on file     Family History:  The patient's family history includes Hyperlipidemia in his father; Hypertension in his father.   ROS:   Please see the history of present illness.    ROS All other systems reviewed and are negative.   PHYSICAL EXAM:   VS:  BP (!) 134/54 (BP Location: Left Arm, Patient Position: Sitting, Cuff Size: Large)   Pulse 80   Ht 5' 11.5" (1.816 m)   Wt 253 lb 3.2 oz (114.9 kg)   SpO2 96%   BMI 34.82 kg/m      General: Alert, oriented x3, no distress, moderately obese.  Healthy scar in the left subclavian area. Head: no evidence of trauma, PERRL, EOMI, no exophtalmos or lid lag, no myxedema, no  xanthelasma; normal ears, nose and oropharynx Neck: normal jugular venous pulsations and no hepatojugular reflux; brisk carotid pulses without delay and no carotid bruits Chest: clear to auscultation, no signs of consolidation by percussion or palpation, normal fremitus, symmetrical and full respiratory excursions Cardiovascular: normal position and quality of the apical impulse, regular rhythm, normal first and paradoxically split second heart sounds, no murmurs, rubs or gallops Abdomen: no tenderness or distention, no masses by palpation, no abnormal pulsatility or arterial bruits, normal bowel sounds, no hepatosplenomegaly Extremities: no clubbing, cyanosis or edema; 2+ radial, ulnar and brachial pulses bilaterally; 2+ right femoral, posterior tibial and dorsalis pedis pulses; 2+ left femoral, posterior tibial and dorsalis pedis pulses; no subclavian or femoral bruits Neurological: grossly nonfocal Psych: Normal mood and affect     Wt Readings from Last 3 Encounters:  02/08/22 253 lb 3.2 oz (114.9 kg)  01/02/22 251 lb 3.2 oz (113.9 kg)  11/22/21 246 lb (111.6 kg)    Studies/Labs Reviewed:   11/08/21 TEE SUMMARY Left ventricular systolic function is normal.  The right ventricle is normal size.  Device lead in the right ventricle  There is fibrinous material noted on the device leads, not seen on  prior.  No thrombus is detected in the left atrial appendage.  Planimetry of the aortic valve in 2D/3D with a planimetred valve area  of 1.36  cm2, consistent with moderate aortic stenosis. There is mild  to moderate aortic regurgitation.  No evidence of vegetation on the aortic valve  There is a bioprosthetic mitral valve replacement (mean PG 3.7 mm Hg  at a HR 63). Notably, there is a prominent suture line on the lateral  aspect of the valve imaged at 0, 45, 90, and 135 degrees. There is no  evidence of valve degradation or peri-valvular leak that would suggest  endocarditis  There is  no tricuspid valve vegetation.  There is no vegetation on the pulmonic valve.  There is no pericardial effusion.  Moderate atherosclerotic plaque(s) in the descending aorta.   In comparison to prior study dated 10/14/21, there is fibrinous material  seen on the device leads, not seen on prior imaging. In the correct  clinical context this could represent a device infection.       Echo 11/06/2021  The resolution of this study is inadequate for r/o endocardiitis  The left ventricle is mildly dilated.  Mild left ventricular hypertrophy.  LV ejection fraction = 55-60%.  Left ventricular systolic function is normal.  Left ventricular filling pattern is prolonged relaxation.  The right ventricle is normal in size and function.  Device lead in the right ventricle  The left atrium is moderately to severely dilated.  The right atrium is mildly dilated.  There is moderate aortic stenosis( likely overestimated due to  signifcant regurgitation).  There is moderate to severe aortic regurgitation.  There is a prosthetic mitral valve.  The mean gradient across the mitral valve is 6.2 mmHg.  The heart rate for the mean mitral valve gradient is 79 BPM.  There is mild tricuspid regurgitation.  The inferior vena cava was not visualized during the exam.  There is no pericardial effusion.   Echo 01/23/2022    1. Left ventricular ejection fraction, by estimation, is 45 to 50%. The  left ventricle has mildly decreased function. The left ventricle  demonstrates global hypokinesis. There is severe concentric left  ventricular hypertrophy. Left ventricular diastolic   function could not be evaluated.   2. Right ventricular systolic function is mildly reduced. The right  ventricular size is normal. There is mildly elevated pulmonary artery  systolic pressure.   3. Left atrial size was mildly dilated.   4. The mitral valve has been repaired/replaced. Mild mitral valve  regurgitation. Possible  stenosis of the bioprosthetic mitral valve. The  mean mitral valve gradient is 9.0 mmHg. (HR 80)  5. The aortic valve is tricuspid. There is moderate calcification of the  aortic valve. There is mild thickening of the aortic valve. Aortic valve  regurgitation is mild to moderate. Moderate aortic valve stenosis. Aortic  valve area, by VTI measures 1.34  cm. Aortic valve mean gradient measures 22.0 mmHg. Aortic valve Vmax  measures 3.04 m/s.   6. 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   7. The inferior vena cava is normal in size with greater than 50%  respiratory variability, suggesting right atrial pressure of 3 mmHg.    Myoview 10/10/2019 Nuclear stress EF: 49%. The left ventricular ejection fraction is mildly decreased (45-54%). Defect 1: There is a small defect of mild severity present in the basal anterolateral, mid anterolateral and apical lateral location. This is an intermediate risk study.   1. Reversible basal to apical anterolateral perfusion defect consistent with ischemia. 2. Septal hypokinesis, which may be due to paced rhythm  EKG:  EKG is ordered today.  It shows sinus rhythm, first-degree AV block, left bundle branch block (QRS 170 ms), QTc 544 ms  Recent Labs: 03/15/2021: ALT 31; BNP 91.9; TSH 3.710 04/22/2021: BUN 75; Creatinine, Ser 3.55; Hemoglobin 15.8; Platelets 238; Potassium 3.5; Sodium 135  12/22/2021 creatinine 3.26, potassium 3.4, hemoglobin 11.2, WBC 5.7, platelets 229  Lipid Panel    Component Value Date/Time   CHOL 108 (L) 03/10/2016 0925   TRIG 228 (H) 03/10/2016 0925   HDL 28 (L) 03/10/2016 0925   CHOLHDL 3.9 03/10/2016 0925   VLDL 46 (H) 03/10/2016 0925   LDLCALC 34 03/10/2016 0925    05/25/2021 Total cholesterol 130, triglycerides 236, HDL 30, LDL 70  10/18/2021 hemoglobin 12.0, WBC 5.9, creatinine 2.82, potassium 3.6, alk phos 116 but otherwise normal LFTs  hemoglobin A1c  8.0% on  10/11/2021  ASSESSMENT:    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 Coronary artery disease of native artery of native heart with stable angina pectoris ([place])   3. Chronic combined systolic and diastolic heart failure ([place])   4. LBBB (left bundle branch block)   5. Nonrheumatic aortic insufficiency with aortic stenosis   6. Chronic obstructive pulmonary disease, unspecified COPD type ([place])   7. Tobacco abuse   8. PAF (paroxysmal atrial fibrillation) ([place])   9. Encounter for monitoring amiodarone therapy   10. Acquired thrombophilia ([place])   11. History of mitral valve replacement with bioprosthetic valve   12. CKD stage 4 due to type 2 diabetes mellitus ([place])   13. Essential hypertension   14. Diabetes mellitus type 2 in obese ([place])   15. Hyperlipidemia LDL goal <70   16. Peripheral venous insufficiency   17. Severe obesity (BMI 35.0-39.9) with comorbidity ([place])      PLAN:  In order of problems listed above:  Endocarditis: Although there was initial suspicion that there was involvement of the bioprosthetic mitral valve, he has shown clinical improvement following extraction of the pacemaker leads and device, with the prosthetic valve left in place.  Clinically he seems to have recovered from endocarditis. CAD: He has developed CCS functional class II exertional angina again, likely due to the fact that he has decreased LV function and dyssynchrony related to LBBB.  May not tolerate higher doses of isosorbide since this is causing headaches.  We will increase the dose of beta-blocker by 50%.  Reevaluate in a few weeks.  We reviewed the difference between stable and unstable angina symptoms, discussed when he should seek emergency room care. CHF: As was to be expected after device extraction, left ventricular systolic function has deteriorated, but only mildly so, now EF about 45%.  He has a very broad LBBB which explains the dyssynchrony.  Has always had NYHA functional class III, in  large part due to the fact that he also has very advanced lung disease.  No clinical evidence of hypervolemia.  His "dry weight" is no longer certain since he lost a lot of weight during endocarditis and is now gaining it back with improved appetite.    Unable to take RAAS inhibitors due to renal dysfunction. He is not a candidate for SGLT2 inhibitors.  On torsemide 40 mg twice daily. LBBB: With very broad QRS at 170 ms and substantial dyssynchrony.  Could consider placement of a new CRT device in the future, if LVEF drops below 35% again. AS/AI: There has been some variation in the interpretation of his valvular abnormalities, but the most recent echo shows  moderate aortic valve stenosis (mean gradient 22 mmHg, dimensionless index 0.43) and mild-moderate aortic insufficiency (pressure half-time 362 ms).  He is not a candidate for surgical valve replacement due to high risk (previous sternotomy, severe COPD, severe CKD), but I believe the anatomical changes in his valve would allow TAVR.  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  Work-up for TAVR will be complicated due to renal insufficiency. COPD: Continues to smoke.  Smoking cessation again strongly recommended.  Clearly a large part of his dyspnea is related to emphysema as his symptoms improved with bronchodilators.  Unfortunately he continues to smoke.  He will always have residual dyspnea due to emphysema.  In the past, even when we achieved euvolemia (even hypovolemia confirmed at right heart catheterization) he still had residual dyspnea and PFTs confirm significant obstructive lung disease.   Paroxysmal atrial fibrillation: None has occurred recently.  On chronic amiodarone.  This was likely the cause for his heart failure exacerbation episode in September 2022, but while on amiodarone therapy has not had any recurrent atrial  fibrillation since September.   He is appropriately anticoagulated.  CHA2DS2-VASc 4 (age, HTN, DM, CHF).  We no longer have the benefit of using his cardiac rhythm device to detect periods of atrial fibrillation.  I encouraged him to consider purchasing a commercially available electrical monitoring device such as Kardia. Amiodarone: Even though he has significant lung problems, this is the only acceptable antiarrhythmic in the setting of severe renal insufficiency and heart failure.  No evidence of toxicity to date.  Normal liver function test in July 2023.  Last TSH was checked in October 2022.  This should be reevaluated. Anticoagulation: No bleeding issues.. S/p MVR: Mildly stenotic prosthesis.  Some variation in gradients across the mitral valve prosthesis, but no evidence of insufficiency and no evidence of vegetations or perivalvular abscess by the most recent TEE.  Still needs additional endocarditis prophylaxis with dental procedures.  CKD stage 4: Most recent creatinine 3.26 on 12/22/2021.  He has a mature AV fistula that has not yet been used for dialysis.  His nephrologist is Dr. [name]. HTN: Currently well controlled.  Diastolic blood pressure is relatively low.  If additional agents are necessary would resume a low-dose of hydralazine.  Avoiding RAAS inhibitors due to renal dysfunction. DM: Seems to be back to baseline.  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  Target LDL less than 70. Peripheral venous insufficiency/lymphedema: He has prominent varicose veins and has had cellulitis in his left lower extremity, which is always a little swollen  Severe obesity: Lost a lot of weight during endocarditis, now weight going up.  Dry weight is no longer certain.  Medication Adjustments/Labs and Tests Ordered: Current  medicines are reviewed at length with the patient today.  Concerns regarding medicines are outlined above.  Medication changes, Labs and Tests ordered today are listed in the Patient Instructions below. Patient Instructions  Medication Instructions:  Your physician has recommended you make the following change in your medication:  INCREASE: Metoprolol 37.73m daily. (1.5 tablets)  *If you need a refill on your cardiac medications before your next appointment, please call your pharmacy*   Lab Work: NONE If you have labs (blood work) drawn today and  your tests are completely normal, you will receive your results only by: MyChart Message (if you have MyChart) OR A paper copy in the mail If you have any lab test that is abnormal or we need to change your treatment, we will call you to review the results.   Testing/Procedures: NONE   Follow-Up: At [facility], you and your health needs are our priority.  As part of our continuing mission to provide you with exceptional heart care, we have created designated Provider Care Teams.  These Care Teams include your primary Cardiologist (physician) and Advanced Practice Providers (APPs -  Physician Assistants and Nurse Practitioners) who all work together to provide you with the care you need, when you need it.  We recommend signing up for the patient portal called "MyChart".  Sign up information is provided on this After Visit Summary.  MyChart is used to connect with patients for Virtual Visits (Telemedicine).  Patients are able to view lab/test results, encounter notes, upcoming appointments, etc.  Non-urgent messages can be sent to your provider as well.   To learn more about what you can do with MyChart, go to [email or web address].    Your next appointment:   4 month(s)  The format for your next appointment:   In Person  Provider:   [name], MD       Signed, [name], MD  02/08/2022 2:20 PM    [address], [place], [place]  [address] Phone: [phone]; Fax: [phone]

## 2022-04-03 ENCOUNTER — Encounter: Payer: Medicare Other | Admitting: Cardiovascular Disease

## 2022-06-19 ENCOUNTER — Telehealth: Payer: Self-pay | Admitting: Cardiovascular Disease

## 2022-06-19 ENCOUNTER — Encounter: Payer: Self-pay | Admitting: Cardiovascular Disease

## 2022-06-19 ENCOUNTER — Ambulatory Visit
Payer: No Typology Code available for payment source | Attending: Cardiovascular Disease | Admitting: Cardiovascular Disease

## 2022-06-19 VITALS — BP 126/58 | HR 83 | Ht 71.5 in | Wt 269.0 lb

## 2022-06-19 DIAGNOSIS — I352 Nonrheumatic aortic (valve) stenosis with insufficiency: Secondary | ICD-10-CM

## 2022-06-19 DIAGNOSIS — I872 Venous insufficiency (chronic) (peripheral): Secondary | ICD-10-CM

## 2022-06-19 DIAGNOSIS — Z5181 Encounter for therapeutic drug level monitoring: Secondary | ICD-10-CM

## 2022-06-19 DIAGNOSIS — Z79899 Other long term (current) drug therapy: Secondary | ICD-10-CM

## 2022-06-19 DIAGNOSIS — I330 Acute and subacute infective endocarditis: Secondary | ICD-10-CM | POA: Diagnosis not present

## 2022-06-19 DIAGNOSIS — D6869 Other thrombophilia: Secondary | ICD-10-CM

## 2022-06-19 DIAGNOSIS — B9561 Methicillin susceptible Staphylococcus aureus infection as the cause of diseases classified elsewhere: Secondary | ICD-10-CM

## 2022-06-19 DIAGNOSIS — I10 Essential (primary) hypertension: Secondary | ICD-10-CM

## 2022-06-19 DIAGNOSIS — I447 Left bundle-branch block, unspecified: Secondary | ICD-10-CM | POA: Diagnosis not present

## 2022-06-19 DIAGNOSIS — I050 Rheumatic mitral stenosis: Secondary | ICD-10-CM

## 2022-06-19 DIAGNOSIS — J449 Chronic obstructive pulmonary disease, unspecified: Secondary | ICD-10-CM

## 2022-06-19 DIAGNOSIS — Z953 Presence of xenogenic heart valve: Secondary | ICD-10-CM

## 2022-06-19 DIAGNOSIS — I480 Paroxysmal atrial fibrillation: Secondary | ICD-10-CM

## 2022-06-19 DIAGNOSIS — I5042 Chronic combined systolic (congestive) and diastolic (congestive) heart failure: Secondary | ICD-10-CM | POA: Diagnosis not present

## 2022-06-19 DIAGNOSIS — I25118 Atherosclerotic heart disease of native coronary artery with other forms of angina pectoris: Secondary | ICD-10-CM

## 2022-06-19 DIAGNOSIS — E1122 Type 2 diabetes mellitus with diabetic chronic kidney disease: Secondary | ICD-10-CM

## 2022-06-19 DIAGNOSIS — E782 Mixed hyperlipidemia: Secondary | ICD-10-CM

## 2022-06-19 DIAGNOSIS — N184 Chronic kidney disease, stage 4 (severe): Secondary | ICD-10-CM

## 2022-06-19 MED ORDER — TORSEMIDE 20 MG PO TABS: 40.0000 mg | ORAL_TABLET | Freq: Two times a day (BID) | ORAL | 1 refills | Status: DC

## 2022-06-19 MED ORDER — ISOSORBIDE MONONITRATE ER 30 MG PO TB24: 15.0000 mg | ORAL_TABLET | Freq: Two times a day (BID) | ORAL | 3 refills | Status: DC

## 2022-06-19 MED ORDER — HYDRALAZINE HCL 10 MG PO TABS: 10.0000 mg | ORAL_TABLET | Freq: Three times a day (TID) | ORAL | 1 refills | Status: DC

## 2022-06-19 MED ORDER — NITROGLYCERIN 0.4 MG SL SUBL: 0.4000 mg | SUBLINGUAL_TABLET | SUBLINGUAL | 6 refills | Status: AC | PRN

## 2022-06-19 NOTE — Telephone Encounter (Signed)
 *  STAT* If patient is at the pharmacy, call can be transferred to refill team.   1. Which medications need to be refilled? (please list name of each medication and dose if known) nitroGLYCERIN (NITROSTAT) 0.4 MG SL tablet   2. Which pharmacy/location (including street and city if local pharmacy) is medication to be sent to? [facility] [place]   3. Do they need a 30 day or 90 day supply? 1 bottle  Pt's wife called said, Dr. Loletha Grayer told them he will call in nitroglycerin for him

## 2022-06-19 NOTE — Telephone Encounter (Signed)
Refills has been sent to the pharmacy. 

## 2022-06-19 NOTE — Progress Notes (Signed)
`   Cardiology Office Note    Date:  06/20/2022   ID:  Dominic [name], DOB January 07, 1951, MRN [id number]  PCP:  [name], FNP  Cardiologist:   [name], MD   Chief Complaint  Patient presents with   Shortness of Breath   Weight Gain    History of Present Illness:  Dominic [name] is a 72 y.o. 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  He was recently hospitalized in [facility] from January 1 until January 4 with community-acquired pneumonia and acute on chronic hypoxic respiratory failure with superimposed heart failure exacerbation.  Initial presentation was with fever, cough, leukocytosis and left lower lobe of the lung consolidation.  He did not require intubation.  He was treated with intravenous ceftriaxone and oral doxycycline, continued on oral cefdinir and doxycycline after discharge.  Following that same hospitalization his torsemide was discontinued, he was briefly treated with intravenous furosemide.  At discharge she was prescribed furosemide 40 mg twice daily.  He has subsequently gained 7 pounds in the 4 days since hospital discharge.  He feels that his breathing is starting to deteriorate.  He has a history of severe LV dysfunction with normalization of LVEF following CRT-D.  As recently as 10/29/2021 he had normal LVEF 55 to 60%.  His biventricular device was extracted due to MRSA bacteremia on 11/17/2021.  Echocardiogram 01/23/2022 showed that his EF had decreased to 45%.  During this current hospitalization echocardiogram 06/13/2021 showed reduction in LV ejection fraction  to 30-35%.    Echocardiogram from recent hospitalization in [facility] shows unchanged valvular findings.  There is moderate aortic regurgitation and moderate aortic stenosis with a mean aortic valve gradient of 26 mmHg and a dimensionless index of 0.37 (last August mean gradient 22 mmHg and dimensionless index 0.43).  There is moderate bioprosthetic mitral stenosis with a mean gradient of 10 mmHg at a heart rate of 86 bpm (previously 9 mmHg at a heart rate of 80 bpm in August 2023).   During the recent hospitalization in [place] his creatinine improved from 3.84 on admission to 3.14 at discharge.  Of note, outpatient labs obtained 05/30/2022 by his nephrologist showed a creatinine of 4.02 (GFR 15).  He has a well-functioning right upper extremity AV fistula that he protects from blood pressure checks and blood draws.  Most recent hemoglobin was 12.3.  Episode of MRSA bacteremia in April 2023 : he was hospitalized to [facility] on 09/27/2021 with sepsis and acute on chronic hypoxic respiratory failure and found to have MRSA bacteremia. He had persistent blood cultures despite treatment with vancomycin and cefepime and was subsequently switched to daptomycin and ceftaroline. There was suspicion of small vegetations on the mitral valve bioprosthesis, RV lead and tricuspid valve.  He received 6 weeks of intravenous daptomycin and oral rifampin.  Hehad a follow-up TEE performed on 10/14/2021 at [facility] that did not show any evidence of vegetations on the prosthetic mitral valve or on the pacemaker leads and showed moderate aortic insufficiency.  The mean mitral valve gradient was 6 mmHg at a heart rate of 92 bpm.  He continued to feel ill, poor appetite and weight loss.  TTE 11/06/2021 showed LVEF  55-60%, moderate aortic stenosis, moderate-severe aortic insufficiency, mean MVR gradient 6 mm Hg at 79 bpm.  He underwent laser lead and generator extraction of the BiV-ICD on 11/17/2021 by Dr. Romona Curls. Blood cultures  12/28/2021 at [facility] - no growth. Tunneled catheter was removed 12/22/2021.  He continues to have his usual NYHA functional class III exertional dyspnea and has not had recent problems with exertional angina.  He has been less active since his hospitalization.  He did have some retrosternal heaviness that he would develop when walking to the mailbox or feeding his chickens.  It resolves promptly with rest and he has not taken any sublingual nitroglycerin.    In April 2021 he had a nuclear stress test that showed evidence of ischemia in a relatively small patch of the anterolateral wall.  Angina seem to improve after we added isosorbide mononitrate, but he does have occasional "migraine headaches".  He has not had any dizziness, syncope or falls and denies any bleeding problems or palpitations.  He did have heart failure exacerbation in September 2020 due to an episode of paroxysmal atrial fibrillation, the first time that had occurred since he had mitral valve replacement surgery.  This prompted reinstitution of antiarrhythmic therapy with amiodarone.  His ECG now shows sinus rhythm with first-degree AV block and a very broad LBBB with a QRS duration of 170 ms.  He continues to smoke a pack of cigarettes a day.     His TEE on 04/26/2021 was interpreted as showing moderate AI. Transvalvular gradients were not reported, but the mean gradient was 20 mm Hg on TTE in October. The AI PHT was 350 ms (comparable to 326 ms on TTE). LVOT diameter on the TTE was overestimated at 2.8 cm. DVI was 0.39  - It is very challenging to measure his LVOT accurately due to shadowing from the mitral prosthetic annulus.  I think the LVOT actually measures around 2.3 cm, adjusting for this the actual calculated aortic valve area would be approximately 1.6 cm, consistent with mild-moderate aortic stenosis and moderate to severe AI.  Morphologically, he has a 3 leaflet aortic valve with moderate thickening and moderate  calcification.  The noncoronary cusp on the left coronary cusp look relatively restrictive and motion.  I think there is enough degenerative change that his aortic valve could hold a TAVR stent.    For the first time in years he required hospitalization for congestive heart failure on 02/14/2021.  On presentation he was found to be in atrial fibrillation with rapid ventricular response.  He reports beginning to feel unwell 1 or 2 days before his hospitalization, with worsening exertional dyspnea.  He was not aware of any palpitations, but on arrival to the emergency room he had atrial fibrillation with RVR that did not respond to treatment with intravenous amiodarone and metoprolol so that he was eventually cardioverted with a single shock at 200 J on the day of admission.  He was started on Eliquis.  The notes from [facility] state that he was evaluated by nephrology while in the hospital and that his renal function was stable.  He has not had angina pectoris since the addition of isosorbide mononitrate.  Nuclear stress test in April 2021 showed a small area of lateral wall ischemia.  Noninvasive/conservative approach recommended due to his renal failure.  His last coronary angiogram was performed in 2016 when he received 2 stents.  Mr. Nall had severe ischemic cardiomyopathy with left ventricular ejection fraction estimated to be approximately  30% by echocardiogram performed in October 2016. That echo describes septal and apical akinesis with global hypokinesis. He had evidence of restrictive filling as well as moderate right ventricular systolic dysfunction and moderate aortic insufficiency. Echo in May 2017 (after PCI and CRT) shows improved LVEF 45-50% and normal function of the mitral valve prosthesis. Right heart catheterization in May 2017 showed right atrial pressure 8, mean wedge pressure 6, cardiac index 2.2 L/minute.   He has an extensive history of coronary artery disease. He tells me that he  thinks he has had 9 cardiac catheterizations and 5 stents. At least on one admission he had 2 episodes of cardiac arrest. I think this was during his hospitalization for bypass surgery/valve replacement. He underwent bypass surgery in [place] in 2015 with synchronous mitral valve replacement with a biological prosthesis. He has a history of previous stent placed in the LAD artery, type of stent and date of procedure unknown. He underwent placement of a bare metal stent to the ostium of the left circumflex coronary artery in September 2015. He subsequently underwent placement of 2 drug-eluting stents at [facility] in October 2016, placed in the right coronary artery and left circumflex coronary artery respectively. At the time of that cardiac catheterization he had 50% stenosis in the distal left main, 90% proximal LAD, 70% in-stent restenosis proximal LAD, patent LIMA to distal LAD, 80% RCA. No mention is made of his second bypass graft, which I presume is occluded. He is on combination aspirin/clopidogrel.  At the time of his bypass surgery received a 31 mm [employer] mitral bioprosthetic valve. By echocardiogram last October 2016 the mean transvalvular gradient was 5 mmHg. The same echocardiogram described a trileaflet unrestricted aortic valve with moderate insufficiency.  His chart describes a history of postoperative atrial fibrillation. Amiodarone was stopped after several years without Afib, but this recurred in September 2022. Amiodarone and anticoagulation were restarted.  He has a history of left bundle branch block and he received a CRT-D [employer] device on 07/01/2015. The device and the leads are MRI conditional.  Following an episode of MRSA bacteremia his biventricular defibrillator generator and leads were all extracted on 11/17/2021.  He completed 6 weeks of intravenous antibiotics and had his tunneled catheter removed in early July 2023.  Blood cultures performed  on 12/28/2021 were all negative.  Transesophageal and transthoracic echocardiograms performed in late May during MRSA bacteremia did not show any evidence of mitral valve prosthesis endocarditis and showed unchanged aortic stenosis and aortic insufficiency.  He has advanced chronic kidney disease. With recent baseline creatinine around 3.0-3.5. During his hospitalization in January 2017 the peak creatinine reached 4.6 (acute diarrheal illness), reaching a similar level in August 2017 when he had nausea and vomiting.[name] He has type 2 diabetes mellitus (not requiring insulin), complicated by kidney disease and neuropathy.  He takes atorvastatin in a relatively low dose. He has long-standing treated hypertension. He is a former smoker and has a history of COPD and takes bronchodilators. He has a history of migraine headaches treated with Fioricet. He underwent sigmoid colectomy in November 2017 for colon cancer.  Past Medical History:  Diagnosis Date   Biventricular ICD (implantable cardioverter-defibrillator) - [facility] 09/05/2015   Jul 01, 2015 [facility] Dynagen X4   CAD (coronary artery disease) 09/05/2015   CABG 2015 PCI-BMS ostial LCX Sept 2015 04/05/2015 cath 50% distal LM, 90% prox LAD, old stent prox LAD 70% ISR, patent LIMA-LAD, 80% prox RCA PCI-DES x2 RCA and LCX  Chronic systolic CHF (congestive heart failure) ([place]) 09/05/2015   a. [place] 10/29/15: PCW 8, CI 2.2. Overall low filling presssures. Mean PA 20; b. echo 10/30/15: EF 45-50%, not tech suff to allwo for LV diastolic fxn, mild AI, nl appearing MVR   CKD stage 4 due to type 2 diabetes mellitus ([place]) 09/05/2015   Diabetes mellitus, type 2 ([place]) 09/05/2015   Essential hypertension 09/05/2015   History of mitral valve replacement with bioprosthetic valve 09/05/2015   31 mm Edwards 2015, Dr. Jerelene Redden   Postoperative atrial fibrillation ([place]) 09/05/2015    Past Surgical History:  Procedure Laterality Date   CARDIAC CATHETERIZATION N/A 10/29/2015   Procedure:  Right Heart Cath;  Surgeon: Jolaine Artist, MD;  Location: [facility];  Service: Cardiovascular;  Laterality: N/A;   CARDIAC VALVE REPLACEMENT  2015   43mm Edwards bioprosthesis   CORONARY ANGIOPLASTY     CORONARY ARTERY BYPASS GRAFT  2015   [facility], Dr. Jerelene Redden.   TEE WITHOUT CARDIOVERSION N/A 04/26/2021   Procedure: TRANSESOPHAGEAL ECHOCARDIOGRAM (TEE);  Surgeon: [name], MD;  Location: [facility] ENDOSCOPY;  Service: Cardiovascular;  Laterality: N/A;    Current Medications: Outpatient Medications Prior to Visit  Medication Sig Dispense Refill   amiodarone (PACERONE) 200 MG tablet TAKE 1 TABLET BY MOUTH 2 TIMES A DAY 90 tablet 3   apixaban (ELIQUIS) 5 MG TABS tablet Take 5 mg by mouth in the morning and at bedtime.     atorvastatin (LIPITOR) 40 MG tablet Take 40 mg by mouth daily.     B Complex-C (SUPER B COMPLEX PO) Take 1 capsule by mouth daily.     Cholecalciferol (VITAMIN D) 50 MCG (2000 UT) tablet Take 2,000 Units by mouth daily.     docusate sodium (COLACE) 100 MG capsule Take 100 mg by mouth 2 (two) times daily.     DULoxetine (CYMBALTA) 60 MG capsule Take 60 mg by mouth daily.      famotidine (PEPCID) 20 MG tablet Take 10 mg by mouth daily.     ferrous sulfate 325 (65 FE) MG tablet Take 325 mg by mouth daily with breakfast.     fluticasone-salmeterol (ADVAIR HFA) 45-21 MCG/ACT inhaler Inhale 1 puff into the lungs 2 (two) times daily.     gabapentin (NEURONTIN) 100 MG capsule Take 100 mg by mouth 2 (two) times daily.     glipiZIDE (GLUCOTROL XL) 10 MG 24 hr tablet Take 10 mg by mouth daily.     glucose blood test strip 1 each by Other route as needed for other. Use as instructed     HYDROcodone-acetaminophen (NORCO/VICODIN) 5-325 MG tablet Take 1 tablet by mouth every 6 (six) hours as needed for moderate pain.     Insulin Glargine w/ Trans Port 100 UNIT/ML SOPN      Insulin Pen Needle (PEN NEEDLES 3/16") 31G X 5 MM MISC Use with insulin pen daily     LANTUS SOLOSTAR 100  UNIT/ML Solostar Pen Inject 30-40 Units into the skin 2 (two) times daily. Take 40 units in the morning and 30 units in the evening     metoprolol succinate (TOPROL XL) 25 MG 24 hr tablet Take 1.5 tablets (37.5 mg total) by mouth daily. 135 tablet 3   polyethylene glycol powder (GLYCOLAX/MIRALAX) 17 GM/SCOOP powder Take 1 Container by mouth daily.     potassium chloride SA (KLOR-CON) 20 MEQ tablet TAKE 2 TABLETS BY MOUTH THREE TIMES A DAY 180 tablet 5   sucralfate (CARAFATE) 1  g tablet Take 1 g by mouth 2 (two) times daily.     TECHLITE PEN NEEDLES 31G X 5 MM MISC USE WITH INSULIN PEN DAILY     furosemide (LASIX) 40 MG tablet Take 40 mg by mouth 2 (two) times daily.     isosorbide mononitrate (IMDUR) 30 MG 24 hr tablet TAKE 1 TABLET BY MOUTH EVERY DAY 90 tablet 2   fluticasone (FLONASE) 50 MCG/ACT nasal spray Place 1 spray into both nostrils daily as needed for allergies. (Patient not taking: Reported on 06/19/2022)     hydrocortisone cream 1 % Apply 1 application  topically daily as needed for itching. (Patient not taking: Reported on 06/19/2022)     metolazone (ZAROXOLYN) 2.5 MG tablet Take one tablet once daily as needed for a weight over 255 pounds. Do not take more than 3 days a week. (Patient not taking: Reported on 06/19/2022) 4 tablet 6   mirtazapine (REMERON) 7.5 MG tablet Take 7.5 mg by mouth at bedtime. (Patient not taking: Reported on 06/19/2022)     tiZANidine (ZANAFLEX) 2 MG tablet Take 2 mg by mouth 2 (two) times daily as needed for muscle spasms. (Patient not taking: Reported on 06/19/2022)     umeclidinium bromide (INCRUSE ELLIPTA) 62.5 MCG/ACT AEPB Inhale into the lungs. (Patient not taking: Reported on 06/19/2022)     albuterol (PROVENTIL HFA;VENTOLIN HFA) 108 (90 Base) MCG/ACT inhaler Inhale 2 puffs into the lungs every 6 (six) hours as needed for wheezing or shortness of breath. (Patient not taking: Reported on 06/19/2022)     nitroGLYCERIN (NITROSTAT) 0.4 MG SL tablet Place 0.4 mg under the  tongue every 5 (five) minutes as needed for chest pain. (Patient not taking: Reported on 06/19/2022)     torsemide (DEMADEX) 20 MG tablet Take 20 mg by mouth in the morning and at bedtime. Take 2 tablets twice daily (Patient not taking: Reported on 06/19/2022)     torsemide (DEMADEX) 20 MG tablet Take 3 tablets by mouth 2 (two) times daily. (Patient not taking: Reported on 06/19/2022)     No facility-administered medications prior to visit.     Allergies:   Hydrochlorothiazide, Amoxicillin-pot clavulanate, Clindamycin, Lisinopril, Meloxicam, Topiramate, and Tamsulosin   Social History   Socioeconomic History   Marital status: Married    Spouse name: Not on file   Number of children: Not on file   Years of education: Not on file   Highest education level: Not on file  Occupational History   Not on file  Tobacco Use   Smoking status: Some Days    Packs/day: 0.50    Years: 40.00    Total pack years: 20.00    Types: Cigarettes   Smokeless tobacco: Never   Tobacco comments:    01/02/2022- Patient smokes about 1/2 half pack daily    06/19/2022- Patient smokes about 10 cigarettes a day  Substance and Sexual Activity   Alcohol use: No    Alcohol/week: 0.0 standard drinks of alcohol   Drug use: No   Sexual activity: Not Currently  Other Topics Concern   Not on file  Social History Narrative   Not on file   Social Determinants of Health   Financial Resource Strain: Not on file  Food Insecurity: Not on file  Transportation Needs: Not on file  Physical Activity: Not on file  Stress: Not on file  Social Connections: Not on file     Family History:  The patient's family history includes Hyperlipidemia in his father; Hypertension  in his father.   ROS:   Please see the history of present illness.    ROS All other systems reviewed and are negative.   PHYSICAL EXAM:   VS:  BP (!) 126/58 (BP Location: Left Arm, Patient Position: Sitting, Cuff Size: Normal)   Pulse 83   Ht 5' 11.5"  (1.816 m)   Wt 269 lb (122 kg)   SpO2 93%   BMI 36.99 kg/m      General: Alert, oriented x3, no distress, healthy scar left subclavian area. Head: no evidence of trauma, PERRL, EOMI, no exophtalmos or lid lag, no myxedema, no xanthelasma; normal ears, nose and oropharynx Neck: normal jugular venous pulsations and no hepatojugular reflux; brisk carotid pulses without delay and no carotid bruits Chest: clear to auscultation, no signs of consolidation by percussion or palpation, normal fremitus, symmetrical and full respiratory excursions Cardiovascular: normal position and quality of the apical impulse, regular rhythm, normal first and second heart sounds, no murmurs, rubs or gallops Abdomen: no tenderness or distention, no masses by palpation, no abnormal pulsatility or arterial bruits, normal bowel sounds, no hepatosplenomegaly Extremities: no clubbing, cyanosis or edema; 2+ radial, ulnar and brachial pulses bilaterally; 2+ right femoral, posterior tibial and dorsalis pedis pulses; 2+ left femoral, posterior tibial and dorsalis pedis pulses; no subclavian or femoral bruits Neurological: grossly nonfocal Psych: Normal mood and affect   Wt Readings from Last 3 Encounters:  06/19/22 269 lb (122 kg)  02/08/22 253 lb 3.2 oz (114.9 kg)  01/02/22 251 lb 3.2 oz (113.9 kg)    Studies/Labs Reviewed:   11/08/21 TEE SUMMARY Left ventricular systolic function is normal.  The right ventricle is normal size.  Device lead in the right ventricle  There is fibrinous material noted on the device leads, not seen on  prior.  No thrombus is detected in the left atrial appendage.  Planimetry of the aortic valve in 2D/3D with a planimetred valve area  of 1.36 cm2, consistent with moderate aortic stenosis. There is mild  to moderate aortic regurgitation.  No evidence of vegetation on the aortic valve  There is a bioprosthetic mitral valve replacement (mean PG 3.7 mm Hg  at a HR 63). Notably, there is a  prominent suture line on the lateral  aspect of the valve imaged at 0, 45, 90, and 135 degrees. There is no  evidence of valve degradation or peri-valvular leak that would suggest  endocarditis  There is no tricuspid valve vegetation.  There is no vegetation on the pulmonic valve.  There is no pericardial effusion.  Moderate atherosclerotic plaque(s) in the descending aorta.   In comparison to prior study dated 10/14/21, there is fibrinous material  seen on the device leads, not seen on prior imaging. In the correct  clinical context this could represent a device infection.       Echo 11/06/2021  The resolution of this study is inadequate for r/o endocardiitis  The left ventricle is mildly dilated.  Mild left ventricular hypertrophy.  LV ejection fraction = 55-60%.  Left ventricular systolic function is normal.  Left ventricular filling pattern is prolonged relaxation.  The right ventricle is normal in size and function.  Device lead in the right ventricle  The left atrium is moderately to severely dilated.  The right atrium is mildly dilated.  There is moderate aortic stenosis( likely overestimated due to  signifcant regurgitation).  There is moderate to severe aortic regurgitation.  There is a prosthetic mitral valve.  The mean gradient  across the mitral valve is 6.2 mmHg.  The heart rate for the mean mitral valve gradient is 79 BPM.  There is mild tricuspid regurgitation.  The inferior vena cava was not visualized during the exam.  There is no pericardial effusion.   Echo 01/23/2022    1. Left ventricular ejection fraction, by estimation, is 45 to 50%. The  left ventricle has mildly decreased function. The left ventricle  demonstrates global hypokinesis. There is severe concentric left  ventricular hypertrophy. Left ventricular diastolic   function could not be evaluated.   2. Right ventricular systolic function is mildly reduced. The right  ventricular size is normal.  There is mildly elevated pulmonary artery  systolic pressure.   3. Left atrial size was mildly dilated.   4. The mitral valve has been repaired/replaced. Mild mitral valve  regurgitation. Possible stenosis of the bioprosthetic mitral valve. The  mean mitral valve gradient is 9.0 mmHg. (HR 80)  5. The aortic valve is tricuspid. There is moderate calcification of the  aortic valve. There is mild thickening of the aortic valve. Aortic valve  regurgitation is mild to moderate. Moderate aortic valve stenosis. Aortic  valve area, by VTI measures 1.34  cm. Aortic valve mean gradient measures 22.0 mmHg. Aortic valve Vmax  measures 3.04 m/s.   6. 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   7. The inferior vena cava is normal in size with greater than 50%  respiratory variability, suggesting right atrial pressure of 3 mmHg.   ECHO [facility] 06/13/2022  Left ventricular systolic function is moderate to severely reduced.  LV ejection fraction = 30-35%.  There is moderate global hypokinesis of the left ventricle.  Abnormal (paradoxical) septal motion consistent with LBBB.  The right ventricular systolic function is normal.  The left atrium is moderately to severely dilated.  There is moderate to severe aortic stenosis.  There is moderate aortic regurgitation.  There is a bioprosthetic mitral valve.  There is moderate mitral stenosis.  There is trace mitral regurgitation.  There is mild tricuspid regurgitation.  Mild pulmonary hypertension.  Estimated right ventricular systolic pressure is 50 mmHg.  There is no pericardial effusion.  Compared to the last study dated 7/32/2025, LV systolic function has declined.  The aortic stenosis and mitral (prosthetic) stenosis have worsened.   The peak aortic valve velocity 378 cm/s. Aortic valve mean pressure gradient is 26 mmHg. AS dimensionless index (VTI) is .37.  The mean gradient across the  mitral valve is 10 mmHg. The heart rate for the mean mitral valve gradient is 86 BPM. MV PHT 113.9 ms.    Myoview 10/10/2019 Nuclear stress EF: 49%. The left ventricular ejection fraction is mildly decreased (45-54%). Defect 1: There is a small defect of mild severity present in the basal anterolateral, mid anterolateral and apical lateral location. This is an intermediate risk study.   1. Reversible basal to apical anterolateral perfusion defect consistent with ischemia. 2. Septal hypokinesis, which may be due to paced rhythm  EKG:  EKG is not ordered today.  02/08/2022 tracing is personally reviewed and shows sinus rhythm, first-degree AV block, left bundle branch block (QRS 170 ms), QTc 544 ms  Recent Labs: No results found for requested labs within last 365 days.  06/15/2022  Creatinine 3.14, BUN 77, potassium 3.8, hemoglobin 12.3, magnesium 2.4  06/12/2022 Creatinine 3.93, BUN 87, potassium 3.5, albumin 3.3, AST 42, ALT 55 BNP 479  There is  no recent TSH  TSH was 3.37/normal 11/05/2021  Lipid Panel    Component Value Date/Time   CHOL 108 (L) 03/10/2016 0925   TRIG 228 (H) 03/10/2016 0925   HDL 28 (L) 03/10/2016 0925   CHOLHDL 3.9 03/10/2016 0925   VLDL 46 (H) 03/10/2016 0925   LDLCALC 34 03/10/2016 0925    05/25/2021 Total cholesterol 130, triglycerides 236, HDL 30, LDL 70    ASSESSMENT:    1. Chronic combined systolic and diastolic heart failure ([place])   2. LBBB (left bundle branch block)   3. 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 Moderate mitral stenosis   5. Status post mitral valve replacement with bioprosthetic valve   6. Nonrheumatic aortic insufficiency with aortic stenosis   7. Coronary artery disease of native artery of native heart with stable angina pectoris ([place])   8. Chronic obstructive pulmonary disease, unspecified COPD type ([place])   9. Paroxysmal atrial fibrillation ([place])   10. Encounter for monitoring  amiodarone therapy   11. Acquired thrombophilia ([place])   12. CKD stage 4 due to type 2 diabetes mellitus ([place])   13. Essential hypertension   14. Mixed hyperlipidemia   15. Peripheral venous insufficiency   16. Severe obesity (BMI 35.0-39.9) with comorbidity ([place])      PLAN:  In order of problems listed above:  CHF: He has clinical evidence of heart failure exacerbation/hypervolemia.  He has gained 7 pounds in the last for 5 days since hospital discharge and is probably more than 10 pounds over his "dry weight".  In August he weighed 253 pounds and appears clinically euvolemic.  He now weighs 269 pounds.  Furosemide does not appear to work as well for him as torsemide.  Switch back to torsemide 40 mg twice daily (his previous maintenance dose was actually 60 mg twice daily).  Unable to treat with RAAS inhibitors/ARNI/SGLT2 inhibitors due to the severity of his kidney disease.  Will try to use hydralazine-nitrates.  Reevaluate clinically and check BMP/BNP in a few days.  Tenuous balance between heart failure and renal failure, but renal function may have to be sacrificed in favor of his heart.  He does have a working dialysis access already.[name] LBBB: He has a very broad left bundle branch block.  He was a CRT "hyper responder" to improve his EF from 30% to 55-60% after institution of CRT and appropriate medical therapy.  After his biventricular pacemaker was extracted, it is not a surprise that his LVEF has decreased and is now back to his previous baseline.  It is likely he will continue to deteriorate without cardiac resynchronization therapy.  Unfortunately he is a suboptimal candidate for repeat CRT.  Will try to get him optimized and then refer to EP, but I would not be surprised if they decline to pursue CRT since he has had a left subclavian device infection with extraction and his dialysis access is from the right upper extremity. Endocarditis: Clinically he appears to have recovered from  endocarditis which occurred in the spring/summer 2023.  This is despite the fact that he still has his bioprosthetic mitral valve which could very well have been seeded at the time of bacteremia.   MVR stenosis: His mitral prosthetic gradients are unchanged compared to last August 2023, but are substantially higher when compared to November 2022 when at the same heart rate the mean transvalvular gradient was only 4 mmHg.  Not sure if this is permanent damage from cured endocarditis or just  deterioration of his mitral valve bioprosthesis in the setting of chronic renal insufficiency.  I do not think he could survive redo mitral valve replacement.  We could consider TMVR ("valve in valve"). AS/AI: During his last hospitalization his echocardiogram showed increasing transaortic gradients, but the dimensionless index was unchanged suggesting that the increased gradients were due to high cardiac output in the setting of infection.  Similarly, I do not think he will ever be a candidate for surgical AVR, but TAVR can be considered.  Since he is not yet on dialysis, workup for TAVR will be complicated due to the risk of contrast nephrotoxicity. CAD: This became a complaint even before his most recent hospitalization with pneumonia although it did seem to improve with enhanced beta-blocker therapy.  We reviewed the difference between stable and unstable angina symptoms, discussed when he should seek emergency room care. COPD: Even when his heart failure was well compensated and ejection fraction had returned to normal he still has NYHA functional class III dyspnea which is largely due to emphysema.  Unfortunately he continues to smoke.  Strongly recommended smoking cessation for both [place] and his wife. Paroxysmal atrial fibrillation: Has remained in sinus rhythm since we restarted amiodarone in September 2022.  CHA2DS2-VASc 5 (age, HTN, DM, CHF, CAD).  We no longer have the benefit of using his cardiac rhythm device  to detect periods of atrial fibrillation.  I encouraged him to consider purchasing a commercially available electrical monitoring device such as Kardia. Amiodarone: Normal TSH in May, needs to be rechecked.  Minimal abnormalities of his transaminases, most likely due to passive congestion on labs performed in [facility] a few days ago.     Even though he has significant lung problems, this is the only acceptable antiarrhythmic in the setting of severe renal insufficiency and heart failure.  No evidence of toxicity to date.  Anticoagulation: No bleeding issues.  CKD stage 4: Creatinine has been relatively stable between 3.1 and 4.0 in the last few months.  GFR around 15.[name]  He has a mature AV fistula that has not yet been used for dialysis.  His nephrologist is Dr. [name]. HTN: Well-controlled, with a relatively low diastolic blood pressure, possibly due to the AV fistula and noncompliant arterial system. DM: Fair glucose control.  Most recent hemoglobin A1c 7.2% HLP: On atorvastatin.  Most recent LDL was 34.  Chronically low HDL 28.  Mildly elevated triglycerides.  I would not recommend independent therapy for his hypertriglyceridemia since he is already on multiple medications and risk for drug reactions.  Target LDL less than 70. Peripheral venous insufficiency/lymphedema: He has prominent varicose veins and has had cellulitis in his left lower extremity, which is always a little swollen.  Peripheral edema is not a good way to assess his volume status Severe obesity: Had a lot of variations in weight last year during his episode of endocarditis between May and August, but seemed to have stabilized at about 253 pounds as his new "dry weight".  He is overall at high risk for serious complications and poor outcome due to multiple serious chronic illnesses.  Medication Adjustments/Labs and Tests Ordered: Current medicines are reviewed at length with the patient today.  Concerns regarding medicines are  outlined above.  Medication changes, Labs and Tests ordered today are listed in the Patient Instructions below. Patient Instructions  Medication Instructions:  Your physician has recommended you make the following change in your medication:    -Stop taking furosemide (lasix).  -Start taking torsemide (  demadex) 40mg  twice daily.  -Start taking hydralazine (apresoline) 10mg  three times a day.  -Change isosorbide mononitrate (imdur) to 15mg  (1/2 tablet) twice daily.   *If you need a refill on your cardiac medications before your next appointment, please call your pharmacy*   Lab Work: Your physician recommends that you return for lab work in: 7-10 days BMET and BNP  If you have labs (blood work) drawn today and your tests are completely normal, you will receive your results only by: [place] (if you have MyChart) OR A paper copy in the mail If you have any lab test that is abnormal or we need to change your treatment, we will call you to review the results.   Follow-Up: At [facility], you and your health needs are our priority.  As part of our continuing mission to provide you with exceptional heart care, we have created designated Provider Care Teams.  These Care Teams include your primary Cardiologist (physician) and Advanced Practice Providers (APPs -  Physician Assistants and Nurse Practitioners) who all work together to provide you with the care you need, when you need it.  We recommend signing up for the patient portal called "MyChart".  Sign up information is provided on this After Visit Summary.  MyChart is used to connect with patients for Virtual Visits (Telemedicine).  Patients are able to view lab/test results, encounter notes, upcoming appointments, etc.  Non-urgent messages can be sent to your provider as well.   To learn more about what you can do with MyChart, go to [email or web address].    Your next appointment:   2 week(s)  The format for your  next appointment:   In Person  Provider:   [name], MD        Signed, [name], MD  06/20/2022 3:15 PM    [facility] [place], [place], [place]  [address] Phone: [phone]; Fax: [phone]

## 2022-06-19 NOTE — Patient Instructions (Addendum)
Medication Instructions:  Your physician has recommended you make the following change in your medication:    -Stop taking furosemide (lasix).  -Start taking torsemide (demadex) 40mg  twice daily.  -Start taking hydralazine (apresoline) 10mg  three times a day.  -Change isosorbide mononitrate (imdur) to 15mg  (1/2 tablet) twice daily.   *If you need a refill on your cardiac medications before your next appointment, please call your pharmacy*   Lab Work: Your physician recommends that you return for lab work in: 7-10 days BMET and BNP  If you have labs (blood work) drawn today and your tests are completely normal, you will receive your results only by: [place] (if you have MyChart) OR A paper copy in the mail If you have any lab test that is abnormal or we need to change your treatment, we will call you to review the results.   Follow-Up: At [facility], you and your health needs are our priority.  As part of our continuing mission to provide you with exceptional heart care, we have created designated Provider Care Teams.  These Care Teams include your primary Cardiologist (physician) and Advanced Practice Providers (APPs -  Physician Assistants and Nurse Practitioners) who all work together to provide you with the care you need, when you need it.  We recommend signing up for the patient portal called "MyChart".  Sign up information is provided on this After Visit Summary.  MyChart is used to connect with patients for Virtual Visits (Telemedicine).  Patients are able to view lab/test results, encounter notes, upcoming appointments, etc.  Non-urgent messages can be sent to your provider as well.   To learn more about what you can do with MyChart, go to [email or web address].    Your next appointment:   2 week(s)  The format for your next appointment:   In Person  Provider:   [name], MD

## 2022-06-20 ENCOUNTER — Encounter: Payer: Self-pay | Admitting: Cardiovascular Disease

## 2022-06-27 LAB — BASIC METABOLIC PANEL
BUN/Creatinine Ratio: 16 (ref 10–24)
BUN: 55 mg/dL — ABNORMAL HIGH (ref 8–27)
CO2: 22 mmol/L (ref 20–29)
Calcium: 9.2 mg/dL (ref 8.6–10.2)
Chloride: 99 mmol/L (ref 96–106)
Creatinine, Ser: 3.51 mg/dL — ABNORMAL HIGH (ref 0.76–1.27)
Glucose: 80 mg/dL (ref 70–99)
Potassium: 4.4 mmol/L (ref 3.5–5.2)
Sodium: 138 mmol/L (ref 134–144)
eGFR: 18 mL/min/{1.73_m2} — ABNORMAL LOW (ref >59)

## 2022-06-27 LAB — BRAIN NATRIURETIC PEPTIDE: BNP: 161.5 pg/mL — ABNORMAL HIGH (ref 0.0–100.0)

## 2022-06-28 ENCOUNTER — Other Ambulatory Visit: Payer: Self-pay

## 2022-06-28 ENCOUNTER — Emergency Department (HOSPITAL_COMMUNITY)
Admission: EM | Admit: 2022-06-28 | Discharge: 2022-06-28 | Disposition: A | Discharge status: Home / Self Care | Payer: No Typology Code available for payment source | Attending: Emergency Medicine | Admitting: Emergency Medicine

## 2022-06-28 ENCOUNTER — Emergency Department (HOSPITAL_COMMUNITY): Payer: No Typology Code available for payment source

## 2022-06-28 ENCOUNTER — Encounter (HOSPITAL_COMMUNITY): Payer: Self-pay

## 2022-06-28 DIAGNOSIS — N189 Chronic kidney disease, unspecified: Secondary | ICD-10-CM | POA: Insufficient documentation

## 2022-06-28 DIAGNOSIS — R079 Chest pain, unspecified: Secondary | ICD-10-CM | POA: Insufficient documentation

## 2022-06-28 DIAGNOSIS — R0602 Shortness of breath: Secondary | ICD-10-CM | POA: Diagnosis not present

## 2022-06-28 DIAGNOSIS — I509 Heart failure, unspecified: Secondary | ICD-10-CM | POA: Insufficient documentation

## 2022-06-28 DIAGNOSIS — F17200 Nicotine dependence, unspecified, uncomplicated: Secondary | ICD-10-CM | POA: Diagnosis not present

## 2022-06-28 DIAGNOSIS — Z5321 Procedure and treatment not carried out due to patient leaving prior to being seen by health care provider: Secondary | ICD-10-CM | POA: Insufficient documentation

## 2022-06-28 DIAGNOSIS — E1122 Type 2 diabetes mellitus with diabetic chronic kidney disease: Secondary | ICD-10-CM | POA: Insufficient documentation

## 2022-06-28 LAB — BASIC METABOLIC PANEL
Anion gap: 12 (ref 5–15)
BUN: 53 mg/dL — ABNORMAL HIGH (ref 8–23)
CO2: 24 mmol/L (ref 22–32)
Calcium: 8.6 mg/dL — ABNORMAL LOW (ref 8.9–10.3)
Chloride: 100 mmol/L (ref 98–111)
Creatinine, Ser: 3.38 mg/dL — ABNORMAL HIGH (ref 0.61–1.24)
GFR, Estimated: 19 mL/min — ABNORMAL LOW (ref >60)
Glucose, Bld: 83 mg/dL (ref 70–99)
Potassium: 3.7 mmol/L (ref 3.5–5.1)
Sodium: 136 mmol/L (ref 135–145)

## 2022-06-28 LAB — BRAIN NATRIURETIC PEPTIDE: B Natriuretic Peptide: 311.7 pg/mL — ABNORMAL HIGH (ref 0.0–100.0)

## 2022-06-28 LAB — CBC
HCT: 39.8 % (ref 39.0–52.0)
Hemoglobin: 13.1 g/dL (ref 13.0–17.0)
MCH: 29.2 pg (ref 26.0–34.0)
MCHC: 32.9 g/dL (ref 30.0–36.0)
MCV: 88.8 fL (ref 80.0–100.0)
Platelets: 222 10*3/uL (ref 150–400)
RBC: 4.48 MIL/uL (ref 4.22–5.81)
RDW: 17 % — ABNORMAL HIGH (ref 11.5–15.5)
WBC: 7.5 10*3/uL (ref 4.0–10.5)
nRBC: 0 % (ref 0.0–0.2)

## 2022-06-28 LAB — LIPASE, BLOOD: Lipase: 60 U/L — ABNORMAL HIGH (ref 11–51)

## 2022-06-28 LAB — PROTIME-INR
INR: 1.2 (ref 0.8–1.2)
Prothrombin Time: 14.6 seconds (ref 11.4–15.2)

## 2022-06-28 LAB — TROPONIN I (HIGH SENSITIVITY)
Troponin I (High Sensitivity): 33 ng/L — ABNORMAL HIGH (ref <18)
Troponin I (High Sensitivity): 30 ng/L — ABNORMAL HIGH (ref <18)

## 2022-06-28 NOTE — ED Provider Triage Note (Signed)
Emergency Medicine Provider Triage Evaluation Note  Dominic [name] , a 72 y.o. male  was evaluated in triage.  Pt complains of chest pain in the center of his chest that started last night.  He also reports associated shortness of breath.  Patient was diagnosed with PNA earlier this month.  Hx significant for DM, CHF, CKD, current tobacco use.   Review of Systems  Positive: As above Negative: As above  Physical Exam  BP 127/64 (BP Location: Left Arm)   Pulse 69   Temp 98.1 F (36.7 C) (Oral)   Resp 18   Ht 5' 11.5" (1.816 m)   Wt 122 kg   SpO2 95%   BMI 36.99 kg/m  Gen:   Awake, no distress   Resp:  Normal effort  MSK:   Moves extremities without difficulty  Other:  Heart rate normal, regular rhythm, murmur appreciated on exam  Medical Decision Making  Medically screening exam initiated at 1:54 PM.  Appropriate orders placed.  Dominic [name] was informed that the remainder of the evaluation will be completed by another provider, this initial triage assessment does not replace that evaluation, and the importance of remaining in the ED until their evaluation is complete.     Dominic [name] R, [place] 06/28/22 1358

## 2022-06-28 NOTE — ED Triage Notes (Signed)
HX of long hospital stay and it has effected his walking.  Patient reports chest pain in the center of his chest that started last night. Patient currently has DM CHF CKD fistula in right arm Continues to smoke.

## 2022-06-28 NOTE — ED Notes (Addendum)
Pt requesting to have PIV removed. Pt stated to RN that he has to leave due to his ride leaving. RN verbalized understanding and encouraged pt to stay and see provider. PIV removed per pt request.

## 2022-07-03 ENCOUNTER — Encounter: Payer: Self-pay | Admitting: Cardiovascular Disease

## 2022-07-03 ENCOUNTER — Ambulatory Visit
Payer: No Typology Code available for payment source | Attending: Cardiovascular Disease | Admitting: Cardiovascular Disease

## 2022-07-03 VITALS — BP 128/68 | HR 76 | Ht 71.5 in | Wt 266.2 lb

## 2022-07-03 DIAGNOSIS — I25118 Atherosclerotic heart disease of native coronary artery with other forms of angina pectoris: Secondary | ICD-10-CM

## 2022-07-03 DIAGNOSIS — I050 Rheumatic mitral stenosis: Secondary | ICD-10-CM

## 2022-07-03 DIAGNOSIS — E1122 Type 2 diabetes mellitus with diabetic chronic kidney disease: Secondary | ICD-10-CM

## 2022-07-03 DIAGNOSIS — I447 Left bundle-branch block, unspecified: Secondary | ICD-10-CM

## 2022-07-03 DIAGNOSIS — I480 Paroxysmal atrial fibrillation: Secondary | ICD-10-CM

## 2022-07-03 DIAGNOSIS — N184 Chronic kidney disease, stage 4 (severe): Secondary | ICD-10-CM

## 2022-07-03 DIAGNOSIS — I5042 Chronic combined systolic (congestive) and diastolic (congestive) heart failure: Secondary | ICD-10-CM | POA: Diagnosis not present

## 2022-07-03 DIAGNOSIS — Z79899 Other long term (current) drug therapy: Secondary | ICD-10-CM

## 2022-07-03 DIAGNOSIS — Z5181 Encounter for therapeutic drug level monitoring: Secondary | ICD-10-CM

## 2022-07-03 DIAGNOSIS — D6869 Other thrombophilia: Secondary | ICD-10-CM

## 2022-07-03 DIAGNOSIS — Z953 Presence of xenogenic heart valve: Secondary | ICD-10-CM

## 2022-07-03 DIAGNOSIS — Z8679 Personal history of other diseases of the circulatory system: Secondary | ICD-10-CM

## 2022-07-03 DIAGNOSIS — I352 Nonrheumatic aortic (valve) stenosis with insufficiency: Secondary | ICD-10-CM

## 2022-07-03 MED ORDER — TORSEMIDE 20 MG PO TABS: 60.0000 mg | ORAL_TABLET | Freq: Two times a day (BID) | ORAL | 3 refills | Status: DC

## 2022-07-03 NOTE — Progress Notes (Signed)
`   Cardiology Office Note    Date:  07/06/2022   ID:  Dominic [name], DOB [birth year], MRN [id number]  PCP:  Gara Kroner, DO  Cardiologist:   [name], MD   No chief complaint on file.   History of Present Illness:  Dominic [name] is a 72 y.o. 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  He was recently hospitalized in [facility] from January 1 until January 4 with community-acquired pneumonia and acute on chronic hypoxic respiratory failure with superimposed heart failure exacerbation.  Initial presentation was with fever, cough, leukocytosis and left lower lobe of the lung consolidation.  He did not require intubation.  He was treated with intravenous ceftriaxone and oral doxycycline, continued on oral cefdinir and doxycycline after discharge.  Following that same hospitalization his torsemide was discontinued, he was briefly treated with intravenous furosemide.  At discharge she was prescribed furosemide 40 mg twice daily.  He was previously taking torsemide 60 mg twice daily.  He subsequently gained 7 pounds in the 4 days since hospital discharge.  His BNP was 311, compared with the previous lowest baseline of around 120.  We saw him back in clinic on January 9 and restarted torsemide 40 mg twice daily.  Since then he is lost about 4 pounds of weight.  His breathing has improved somewhat, but he remains short of breath NYHA functional class III.  He does not have orthopnea or PND and does not have any lower extremity edema.  Most recent creatinine was 3.38 on 06/28/2022.  He is not  anemic.  He has a history of severe LV dysfunction with normalization of LVEF following CRT-D.  As recently as 10/29/2021 he had normal LVEF 55 to 60%.  His biventricular device was extracted due to MRSA bacteremia on 11/17/2021.  Echocardiogram 01/23/2022 showed that his EF had decreased to 45%.  During this current hospitalization echocardiogram 06/13/2021 showed reduction in LV ejection fraction to 30-35%.    Echocardiogram from recent hospitalization in [facility] shows unchanged valvular findings.  There is moderate aortic regurgitation and moderate aortic stenosis with a mean aortic valve gradient of 26 mmHg and a dimensionless index of 0.37 (last August mean gradient 22 mmHg and dimensionless index 0.43).  There is moderate bioprosthetic mitral stenosis with a mean gradient of 10 mmHg at a heart rate of 86 bpm (previously 9 mmHg at a heart rate of 80 bpm in August 2023).   During the recent hospitalization in [place] his creatinine improved from 3.84 on admission to 3.14 at discharge.  Of note, outpatient labs obtained 05/30/2022 by his nephrologist showed a creatinine of 4.02 (GFR 15).  He has a well-functioning right upper extremity AV fistula that he protects from blood pressure checks and blood draws.  Most recent hemoglobin was 12.3.  Episode of MRSA bacteremia in April 2023 : he was hospitalized to [facility] on 09/27/2021 with sepsis and acute on chronic hypoxic respiratory failure and found to have MRSA bacteremia. He had persistent blood cultures despite treatment with vancomycin and cefepime and was subsequently switched to daptomycin and ceftaroline. There was suspicion of small vegetations on the mitral valve bioprosthesis, RV lead and tricuspid valve.  He received 6 weeks of intravenous daptomycin and oral rifampin.  Hehad a follow-up TEE performed on 10/14/2021 at [facility] that did not show any evidence of vegetations on the prosthetic mitral valve or on the pacemaker leads and showed  moderate aortic insufficiency.  The mean mitral valve gradient was 6 mmHg at a heart rate of 92 bpm.  He continued to feel ill, poor appetite and weight loss.  TTE 11/06/2021 showed LVEF 55-60%, moderate aortic stenosis, moderate-severe aortic insufficiency, mean MVR gradient 6 mm Hg at 79 bpm.  He underwent laser lead and generator extraction of the BiV-ICD on 11/17/2021 by Dr. Romona Curls. Blood cultures 12/28/2021 at [facility] - no growth. Tunneled catheter was removed 12/22/2021.  He continues to have his usual NYHA functional class III exertional dyspnea and has not had recent problems with exertional angina.  He has been less active since his hospitalization.  He did have some retrosternal heaviness that he would develop when walking to the mailbox or feeding his chickens.  It resolves promptly with rest and he has not taken any sublingual nitroglycerin.    In April 2021 he had a nuclear stress test that showed evidence of ischemia in a relatively small patch of the anterolateral wall.  Angina seem to improve after we added isosorbide mononitrate, but he does have occasional "migraine headaches".  He has not had any dizziness, syncope or falls and denies any bleeding problems or palpitations.  He did have heart failure exacerbation in September 2020 due to an episode of paroxysmal atrial fibrillation, the first time that had occurred since he had mitral valve replacement surgery.  This prompted reinstitution of antiarrhythmic therapy with amiodarone.  His ECG now shows sinus rhythm with first-degree AV block and a very broad LBBB with a QRS duration of 170 ms.  He continues to smoke a pack of cigarettes a day.     His TEE on 04/26/2021 was interpreted as showing moderate AI. Transvalvular gradients were not reported, but the mean gradient was 20 mm Hg on TTE in October. The AI PHT was 350 ms (comparable to 326 ms on TTE). LVOT diameter on the TTE was overestimated at 2.8 cm. DVI was 0.39  - It is  very challenging to measure his LVOT accurately due to shadowing from the mitral prosthetic annulus.  I think the LVOT actually measures around 2.3 cm, adjusting for this the actual calculated aortic valve area would be approximately 1.6 cm, consistent with mild-moderate aortic stenosis and moderate to severe AI.  Morphologically, he has a 3 leaflet aortic valve with moderate thickening and moderate calcification.  The noncoronary cusp on the left coronary cusp look relatively restrictive and motion.  I think there is enough degenerative change that his aortic valve could hold a TAVR stent.    For the first time in years he required hospitalization for congestive heart failure on 02/14/2021.  On presentation he was found to be in atrial fibrillation with rapid ventricular response.  He reports beginning to feel unwell 1 or 2 days before his hospitalization, with worsening exertional dyspnea.  He was not aware of any palpitations, but on arrival to the emergency room he had atrial fibrillation with RVR that did not respond to treatment with intravenous amiodarone and metoprolol so that he was eventually cardioverted with a single shock at 200 J on the day of admission.  He was started on Eliquis.  The notes from [facility] state that he was evaluated by nephrology while in the  hospital and that his renal function was stable.  He has not had angina pectoris since the addition of isosorbide mononitrate.  Nuclear stress test in April 2021 showed a small area of lateral wall ischemia.  Noninvasive/conservative approach recommended due to his renal failure.  His last coronary angiogram was performed in 2016 when he received 2 stents.  Dominic [name] had severe ischemic cardiomyopathy with left ventricular ejection fraction estimated to be approximately 30% by echocardiogram performed in October 2016. That echo describes septal and apical akinesis with global hypokinesis. He had evidence of restrictive filling as well  as moderate right ventricular systolic dysfunction and moderate aortic insufficiency. Echo in May 2017 (after PCI and CRT) shows improved LVEF 45-50% and normal function of the mitral valve prosthesis. Right heart catheterization in May 2017 showed right atrial pressure 8, mean wedge pressure 6, cardiac index 2.2 L/minute.   He has an extensive history of coronary artery disease. He tells me that he thinks he has had 9 cardiac catheterizations and 5 stents. At least on one admission he had 2 episodes of cardiac arrest. I think this was during his hospitalization for bypass surgery/valve replacement. He underwent bypass surgery in [place] in 2015 with synchronous mitral valve replacement with a biological prosthesis. He has a history of previous stent placed in the LAD artery, type of stent and date of procedure unknown. He underwent placement of a bare metal stent to the ostium of the left circumflex coronary artery in September 2015. He subsequently underwent placement of 2 drug-eluting stents at [facility] in October 2016, placed in the right coronary artery and left circumflex coronary artery respectively. At the time of that cardiac catheterization he had 50% stenosis in the distal left main, 90% proximal LAD, 70% in-stent restenosis proximal LAD, patent LIMA to distal LAD, 80% RCA. No mention is made of his second bypass graft, which I presume is occluded. He is on combination aspirin/clopidogrel.  At the time of his bypass surgery received a 31 mm [employer] mitral bioprosthetic valve. By echocardiogram last October 2016 the mean transvalvular gradient was 5 mmHg. The same echocardiogram described a trileaflet unrestricted aortic valve with moderate insufficiency.  His chart describes a history of postoperative atrial fibrillation. Amiodarone was stopped after several years without Afib, but this recurred in September 2022. Amiodarone and anticoagulation were restarted.  He has a  history of left bundle branch block and he received a CRT-D [employer] device on 07/01/2015. The device and the leads are MRI conditional.  Following an episode of MRSA bacteremia his biventricular defibrillator generator and leads were all extracted on 11/17/2021.  He completed 6 weeks of intravenous antibiotics and had his tunneled catheter removed in early July 2023.  Blood cultures performed on 12/28/2021 were all negative.  Transesophageal and transthoracic echocardiograms performed in late May during MRSA bacteremia did not show any evidence of mitral valve prosthesis endocarditis and showed unchanged aortic stenosis and aortic insufficiency.  He has advanced chronic kidney disease. With recent baseline creatinine around 3.0-3.5. During his hospitalization in January 2017 the peak creatinine reached 4.6 (acute diarrheal illness), reaching a similar level in August 2017 when he had nausea and vomiting.[name] He has type 2 diabetes mellitus (not requiring insulin), complicated by kidney disease and neuropathy.  He takes atorvastatin in a relatively low dose. He has long-standing treated hypertension. He is a former smoker and has a history of COPD and takes bronchodilators. He has a history of migraine headaches treated with  Fioricet. He underwent sigmoid colectomy in November 2017 for colon cancer.  Past Medical History:  Diagnosis Date   Biventricular ICD (implantable cardioverter-defibrillator) - [facility] 09/05/2015   Jul 01, 2015 [facility] Dynagen X4   CAD (coronary artery disease) 09/05/2015   CABG 2015 PCI-BMS ostial LCX Sept 2015 04/05/2015 cath 50% distal LM, 90% prox LAD, old stent prox LAD 70% ISR, patent LIMA-LAD, 80% prox RCA PCI-DES x2 RCA and LCX    Chronic systolic CHF (congestive heart failure) ([place]) 09/05/2015   a. [place] 10/29/15: PCW 8, CI 2.2. Overall low filling presssures. Mean PA 20; b. echo 10/30/15: EF 45-50%, not tech suff to allwo for LV diastolic fxn, mild AI, nl appearing MVR   CKD  stage 4 due to type 2 diabetes mellitus ([place]) 09/05/2015   Diabetes mellitus, type 2 ([place]) 09/05/2015   Essential hypertension 09/05/2015   History of mitral valve replacement with bioprosthetic valve 09/05/2015   31 mm Edwards 2015, Dr. Jerelene Redden   Postoperative atrial fibrillation ([place]) 09/05/2015    Past Surgical History:  Procedure Laterality Date   CARDIAC CATHETERIZATION N/A 10/29/2015   Procedure: Right Heart Cath;  Surgeon: Jolaine Artist, MD;  Location: [place] [facility];  Service: Cardiovascular;  Laterality: N/A;   CARDIAC VALVE REPLACEMENT  2015   38mm Edwards bioprosthesis   CORONARY ANGIOPLASTY     CORONARY ARTERY BYPASS GRAFT  2015   [facility], Dr. Jerelene Redden.   TEE WITHOUT CARDIOVERSION N/A 04/26/2021   Procedure: TRANSESOPHAGEAL ECHOCARDIOGRAM (TEE);  Surgeon: [name], MD;  Location: [facility] ENDOSCOPY;  Service: Cardiovascular;  Laterality: N/A;    Current Medications: Outpatient Medications Prior to Visit  Medication Sig Dispense Refill   amiodarone (PACERONE) 200 MG tablet TAKE 1 TABLET BY MOUTH 2 TIMES A DAY 90 tablet 3   apixaban (ELIQUIS) 5 MG TABS tablet Take 5 mg by mouth in the morning and at bedtime.     atorvastatin (LIPITOR) 40 MG tablet Take 40 mg by mouth daily.     B Complex-C (SUPER B COMPLEX PO) Take 1 capsule by mouth daily.     Cholecalciferol (VITAMIN D) 50 MCG (2000 UT) tablet Take 2,000 Units by mouth daily.     docusate sodium (COLACE) 100 MG capsule Take 100 mg by mouth 2 (two) times daily.     DULoxetine (CYMBALTA) 60 MG capsule Take 60 mg by mouth daily.      famotidine (PEPCID) 20 MG tablet Take 10 mg by mouth daily.     ferrous sulfate 325 (65 FE) MG tablet Take 325 mg by mouth daily with breakfast.     fluticasone (FLONASE) 50 MCG/ACT nasal spray Place 1 spray into both nostrils daily as needed for allergies.     fluticasone-salmeterol (ADVAIR HFA) 45-21 MCG/ACT inhaler Inhale 1 puff into the lungs 2 (two) times daily.     gabapentin (NEURONTIN) 100  MG capsule Take 100 mg by mouth 2 (two) times daily.     glipiZIDE (GLUCOTROL XL) 10 MG 24 hr tablet Take 10 mg by mouth daily.     glucose blood test strip 1 each by Other route as needed for other. Use as instructed     hydrALAZINE (APRESOLINE) 10 MG tablet Take 1 tablet (10 mg total) by mouth 3 (three) times daily. 270 tablet 1   HYDROcodone-acetaminophen (NORCO/VICODIN) 5-325 MG tablet Take 1 tablet by mouth every 6 (six) hours as needed for moderate pain.     hydrocortisone cream 1 % Apply 1 application  topically daily as needed for itching.     Insulin Glargine w/ Trans Port 100 UNIT/ML SOPN      Insulin Pen Needle (PEN NEEDLES 3/16") 31G X 5 MM MISC Use with insulin pen daily     isosorbide mononitrate (IMDUR) 30 MG 24 hr tablet Take 0.5 tablets (15 mg total) by mouth 2 (two) times daily. 90 tablet 3   LANTUS SOLOSTAR 100 UNIT/ML Solostar Pen Inject 30-40 Units into the skin 2 (two) times daily. Take 40 units in the morning and 30 units in the evening     metoprolol succinate (TOPROL XL) 25 MG 24 hr tablet Take 1.5 tablets (37.5 mg total) by mouth daily. 135 tablet 3   nitroGLYCERIN (NITROSTAT) 0.4 MG SL tablet Place 1 tablet (0.4 mg total) under the tongue every 5 (five) minutes as needed for chest pain. 25 tablet 6   polyethylene glycol powder (GLYCOLAX/MIRALAX) 17 GM/SCOOP powder Take 1 Container by mouth daily.     potassium chloride SA (KLOR-CON) 20 MEQ tablet TAKE 2 TABLETS BY MOUTH THREE TIMES A DAY 180 tablet 5   sucralfate (CARAFATE) 1 g tablet Take 1 g by mouth 2 (two) times daily.     TECHLITE PEN NEEDLES 31G X 5 MM MISC USE WITH INSULIN PEN DAILY     tiZANidine (ZANAFLEX) 2 MG tablet Take 2 mg by mouth 2 (two) times daily as needed for muscle spasms.     torsemide (DEMADEX) 20 MG tablet Take 2 tablets (40 mg total) by mouth in the morning and at bedtime. Take 2 tablets twice daily 360 tablet 1   No facility-administered medications prior to visit.     Allergies:    Hydrochlorothiazide, Amoxicillin-pot clavulanate, Clindamycin, Lisinopril, Meloxicam, Topiramate, and Tamsulosin   Social History   Socioeconomic History   Marital status: Married    Spouse name: Not on file   Number of children: Not on file   Years of education: Not on file   Highest education level: Not on file  Occupational History   Not on file  Tobacco Use   Smoking status: Some Days    Packs/day: 0.50    Years: 40.00    Total pack years: 20.00    Types: Cigarettes   Smokeless tobacco: Never   Tobacco comments:    01/02/2022- Patient smokes about 1/2 half pack daily    06/19/2022- Patient smokes about 10 cigarettes a day  Substance and Sexual Activity   Alcohol use: No    Alcohol/week: 0.0 standard drinks of alcohol   Drug use: No   Sexual activity: Not Currently  Other Topics Concern   Not on file  Social History Narrative   Not on file   Social Determinants of Health   Financial Resource Strain: Not on file  Food Insecurity: Not on file  Transportation Needs: Not on file  Physical Activity: Not on file  Stress: Not on file  Social Connections: Not on file     Family History:  The patient's family history includes Hyperlipidemia in his father; Hypertension in his father.   ROS:   Please see the history of present illness.    ROS All other systems reviewed and are negative.   PHYSICAL EXAM:   VS:  BP 128/68   Pulse 76   Ht 5' 11.5" (1.816 m)   Wt 266 lb 3.2 oz (120.7 kg)   SpO2 94%   BMI 36.61 kg/m      General: Alert, oriented x3, no distress, severely  obese.  Smells strongly of tobacco.  Defibrillator extraction site has healed well. Head: no evidence of trauma, PERRL, EOMI, no exophtalmos or lid lag, no myxedema, no xanthelasma; normal ears, nose and oropharynx Neck: normal jugular venous pulsations and no hepatojugular reflux; brisk carotid pulses without delay and no carotid bruits Chest: clear to auscultation, no signs of consolidation by  percussion or palpation, normal fremitus, symmetrical and full respiratory excursions Cardiovascular: normal position and quality of the apical impulse, regular rhythm, normal first and second heart sounds, no murmurs, rubs or gallops Abdomen: no tenderness or distention, no masses by palpation, no abnormal pulsatility or arterial bruits, normal bowel sounds, no hepatosplenomegaly Extremities: no clubbing, cyanosis or edema; 2+ radial, ulnar and brachial pulses bilaterally; 2+ right femoral, posterior tibial and dorsalis pedis pulses; 2+ left femoral, posterior tibial and dorsalis pedis pulses; no subclavian or femoral bruits Neurological: grossly nonfocal Psych: Normal mood and affect     Wt Readings from Last 3 Encounters:  07/03/22 266 lb 3.2 oz (120.7 kg)  06/28/22 269 lb (122 kg)  06/19/22 269 lb (122 kg)    Studies/Labs Reviewed:   11/08/21 TEE SUMMARY Left ventricular systolic function is normal.  The right ventricle is normal size.  Device lead in the right ventricle  There is fibrinous material noted on the device leads, not seen on  prior.  No thrombus is detected in the left atrial appendage.  Planimetry of the aortic valve in 2D/3D with a planimetred valve area  of 1.36 cm2, consistent with moderate aortic stenosis. There is mild  to moderate aortic regurgitation.  No evidence of vegetation on the aortic valve  There is a bioprosthetic mitral valve replacement (mean PG 3.7 mm Hg  at a HR 63). Notably, there is a prominent suture line on the lateral  aspect of the valve imaged at 0, 45, 90, and 135 degrees. There is no  evidence of valve degradation or peri-valvular leak that would suggest  endocarditis  There is no tricuspid valve vegetation.  There is no vegetation on the pulmonic valve.  There is no pericardial effusion.  Moderate atherosclerotic plaque(s) in the descending aorta.   In comparison to prior study dated 10/14/21, there is fibrinous material  seen on  the device leads, not seen on prior imaging. In the correct  clinical context this could represent a device infection.       Echo 11/06/2021  The resolution of this study is inadequate for r/o endocardiitis  The left ventricle is mildly dilated.  Mild left ventricular hypertrophy.  LV ejection fraction = 55-60%.  Left ventricular systolic function is normal.  Left ventricular filling pattern is prolonged relaxation.  The right ventricle is normal in size and function.  Device lead in the right ventricle  The left atrium is moderately to severely dilated.  The right atrium is mildly dilated.  There is moderate aortic stenosis( likely overestimated due to  signifcant regurgitation).  There is moderate to severe aortic regurgitation.  There is a prosthetic mitral valve.  The mean gradient across the mitral valve is 6.2 mmHg.  The heart rate for the mean mitral valve gradient is 79 BPM.  There is mild tricuspid regurgitation.  The inferior vena cava was not visualized during the exam.  There is no pericardial effusion.   Echo 01/23/2022    1. Left ventricular ejection fraction, by estimation, is 45 to 50%. The  left ventricle has mildly decreased function. The left ventricle  demonstrates global hypokinesis. There  is severe concentric left  ventricular hypertrophy. Left ventricular diastolic   function could not be evaluated.   2. Right ventricular systolic function is mildly reduced. The right  ventricular size is normal. There is mildly elevated pulmonary artery  systolic pressure.   3. Left atrial size was mildly dilated.   4. The mitral valve has been repaired/replaced. Mild mitral valve  regurgitation. Possible stenosis of the bioprosthetic mitral valve. The  mean mitral valve gradient is 9.0 mmHg. (HR 80)  5. The aortic valve is tricuspid. There is moderate calcification of the  aortic valve. There is mild thickening of the aortic valve. Aortic valve  regurgitation is  mild to moderate. Moderate aortic valve stenosis. Aortic  valve area, by VTI measures 1.34  cm. Aortic valve mean gradient measures 22.0 mmHg. Aortic valve Vmax  measures 3.04 m/s.   6. 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   7. The inferior vena cava is normal in size with greater than 50%  respiratory variability, suggesting right atrial pressure of 3 mmHg.   ECHO [facility] 06/13/2022  Left ventricular systolic function is moderate to severely reduced.  LV ejection fraction = 30-35%.  There is moderate global hypokinesis of the left ventricle.  Abnormal (paradoxical) septal motion consistent with LBBB.  The right ventricular systolic function is normal.  The left atrium is moderately to severely dilated.  There is moderate to severe aortic stenosis.  There is moderate aortic regurgitation.  There is a bioprosthetic mitral valve.  There is moderate mitral stenosis.  There is trace mitral regurgitation.  There is mild tricuspid regurgitation.  Mild pulmonary hypertension.  Estimated right ventricular systolic pressure is 50 mmHg.  There is no pericardial effusion.  Compared to the last study dated 1/61/0960, LV systolic function has declined.  The aortic stenosis and mitral (prosthetic) stenosis have worsened.   The peak aortic valve velocity 378 cm/s. Aortic valve mean pressure gradient is 26 mmHg. AS dimensionless index (VTI) is .37.  The mean gradient across the mitral valve is 10 mmHg. The heart rate for the mean mitral valve gradient is 86 BPM. MV PHT 113.9 ms.    Myoview 10/10/2019 Nuclear stress EF: 49%. The left ventricular ejection fraction is mildly decreased (45-54%). Defect 1: There is a small defect of mild severity present in the basal anterolateral, mid anterolateral and apical lateral location. This is an intermediate risk study.   1. Reversible basal to apical anterolateral perfusion defect consistent  with ischemia. 2. Septal hypokinesis, which may be due to paced rhythm  EKG:  EKG is not ordered today.  02/08/2022 tracing is personally reviewed and shows sinus rhythm, first-degree AV block, left bundle branch block (QRS 170 ms), QTc 544 ms  Recent Labs: 06/28/2022: B Natriuretic Peptide 311.7; BUN 53; Creatinine, Ser 3.38; Hemoglobin 13.1; Platelets 222; Potassium 3.7; Sodium 136  06/15/2022  Creatinine 3.14, BUN 77, potassium 3.8, hemoglobin 12.3, magnesium 2.4  06/12/2022 Creatinine 3.93, BUN 87, potassium 3.5, albumin 3.3, AST 42, ALT 55 BNP 479  There is no recent TSH  TSH was 3.37/normal 11/05/2021  Lipid Panel    Component Value Date/Time   CHOL 108 (L) 03/10/2016 0925   TRIG 228 (H) 03/10/2016 0925   HDL 28 (L) 03/10/2016 0925   CHOLHDL 3.9 03/10/2016 0925   VLDL 46 (H) 03/10/2016 0925   LDLCALC 34 03/10/2016 0925    05/25/2021 Total cholesterol 130, triglycerides 236, HDL 30, LDL  70    ASSESSMENT:    1. Chronic combined systolic and diastolic heart failure ([place])   2. LBBB (left bundle branch block)   3. History of acute bacterial endocarditis   4. Status post mitral valve replacement with bioprosthetic valve   5. Moderate mitral stenosis   6. Nonrheumatic aortic insufficiency with aortic stenosis   7. Coronary artery disease of native artery of native heart with stable angina pectoris ([place])   8. Paroxysmal atrial fibrillation (HCC)   9. Encounter for monitoring amiodarone therapy   10. Acquired thrombophilia ([place])   11. CKD (chronic kidney disease) stage 4, GFR 15-29 ml/min (HCC)   12. Controlled type 2 diabetes mellitus with stage 4 chronic kidney disease, without long-term current use of insulin (HCC)      PLAN:  In order of problems listed above:  CHF: Clinically with somewhat improved functional status and fewer signs of hypervolemia on exam.  Tolerated the reinstitution of diuretics without worsening renal function.  Will move him back to his  previous regimen of torsemide 60 mg twice daily.  Previously estimated his "dry weight" to be around 253 pounds.  He really does not do as well on furosemide as he does on torsemide.   Unable to treat with RAAS inhibitors/ARNI/SGLT2 inhibitors due to the severity of his kidney disease.  Will try to use hydralazine-nitrates.  Reevaluate clinically and check BMP/BNP in a few days.  Tenuous balance between heart failure and renal failure, but renal function may have to be sacrificed in favor of his heart.  He does have a working dialysis access already.  Recheck basic metabolic panel and BNP in another couple of weeks.[name] LBBB: He has a very broad left bundle branch block.  He was a CRT "hyper responder" to improve his EF from 30% to 55-60% after institution of CRT and appropriate medical therapy.  After his biventricular pacemaker was extracted, it is not a surprise that his LVEF has decreased and is now back to his previous baseline.  It is likely he will continue to deteriorate without cardiac resynchronization therapy.  He is a poor candidate for implantation of his CRT device.  I checked with one of our electrophysiology colleagues who states that the risk of device reinfection if it is implanted ipsilaterally is as high as 25%.  Cannot implant the device on the opposite side which is his dialysis access site.  The risk of complications outweighs the benefit of the device. History of endocarditis: Clinically he appears to have recovered from endocarditis which occurred in the spring/summer 2023.  This is despite the fact that he still has his bioprosthetic mitral valve which could very well have been seeded at the time of bacteremia.   MVR stenosis: Mitral prosthetic gradients are stable compared to August 2023, but substantially higher than his baseline in November 22, which suggest some degree of permanent injury to the prosthetic valve following his episode of endocarditis.  He is not a candidate for surgical  mitral valve replacement.  Could consider TMVR valve in valve.  AS/AI: During his last hospitalization his echocardiogram showed increasing transaortic gradients, but the dimensionless index was unchanged suggesting that the increased gradients were due to high cardiac output in the setting of infection.  Similarly, I do not think he will ever be a candidate for surgical AVR, but TAVR can be considered.  Since he is not yet on dialysis, workup for TAVR will be complicated due to the risk of contrast nephrotoxicity. CAD: Currently  without angina.  On beta-blocker.  On highly effective statin.  Not on aspirin due to full anticoagulation. COPD: Clearly this is also a major contributor to his shortness of breath.  Even when his heart failure was well compensated and ejection fraction had returned to normal he still has NYHA functional class III dyspnea which is largely due to emphysema.  Unfortunately he continues to smoke.  Strongly recommended smoking cessation for both Dominic [name] and his wife. Paroxysmal atrial fibrillation: Has remained in sinus rhythm since we restarted amiodarone in September 2022.  CHA2DS2-VASc 5 (age, HTN, DM, CHF, CAD).  We no longer have the benefit of using his cardiac rhythm device to detect periods of atrial fibrillation.  I encouraged him to consider purchasing a commercially available electrical monitoring device such as Kardia. Amiodarone: Even though he has significant lung issues, amiodarone is the only acceptable antiarrhythmic in the setting of severe renal insufficiency, CAD and heart failure.  Needs liver function test and TSH checked every 6 months. Anticoagulation: No bleeding complications.  The current dose of apixaban remains appropriate even in the face of his renal dysfunction due to his body size and age..  CKD stage 4: Creatinine has been relatively stable between 3.1 and 4.0 in the last few months.  GFR around 15.  He has a mature AV fistula that has not yet been used  for dialysis.  His nephrologist is Dr. [name]. HTN: Adequate control. DM: Fair glucose control.  Most recent hemoglobin A1c 7.2% HLP: On atorvastatin.  Most recent LDL was 34.  Chronically low HDL 28.  Mildly elevated triglycerides.  I would not recommend independent therapy for his hypertriglyceridemia since he is already on multiple medications and risk for drug reactions.  Target LDL less than 70. Peripheral venous insufficiency/lymphedema: He has prominent varicose veins and has had cellulitis in his left lower extremity, which is always a little swollen.  Peripheral edema is not a good way to assess his volume status currently without any evidence of edema. Severe obesity: Had a lot of variations in weight last year during his episode of endocarditis between May and August, but seemed to have stabilized at about 253 pounds as his new "dry weight".  He is overall at high risk for serious complications and poor outcome due to multiple serious chronic illnesses.  Medication Adjustments/Labs and Tests Ordered: Current medicines are reviewed at length with the patient today.  Concerns regarding medicines are outlined above.  Medication changes, Labs and Tests ordered today are listed in the Patient Instructions below. Patient Instructions  Medication Instructions:  Torsemide 60mg  twice daily  *If you need a refill on your cardiac medications before your next appointment, please call your pharmacy*   Lab Work: BNP- get lab drawn at Nephrology appt. (Bring lab slip) If you have labs (blood work) drawn today and your tests are completely normal, you will receive your results only by: [place] (if you have MyChart) OR A paper copy in the mail If you have any lab test that is abnormal or we need to change your treatment, we will call you to review the results.  Follow-Up: At [facility], you and your health needs are our priority.  As part of our continuing mission to provide  you with exceptional heart care, we have created designated Provider Care Teams.  These Care Teams include your primary Cardiologist (physician) and Advanced Practice Providers (APPs -  Physician Assistants and Nurse Practitioners) who all work together to provide you with  the care you need, when you need it.  We recommend signing up for the patient portal called "MyChart".  Sign up information is provided on this After Visit Summary.  MyChart is used to connect with patients for Virtual Visits (Telemedicine).  Patients are able to view lab/test results, encounter notes, upcoming appointments, etc.  Non-urgent messages can be sent to your provider as well.   To learn more about what you can do with MyChart, go to [email or web address].    Your next appointment:   08/17/22 at 0900   Provider:   [name], MD          Signed, [name], MD  07/06/2022 1:07 PM    [facility] [place], [place], [place]  [address] Phone: [phone]; Fax: [phone]

## 2022-07-03 NOTE — Patient Instructions (Addendum)
Medication Instructions:  Torsemide 60mg  twice daily  *If you need a refill on your cardiac medications before your next appointment, please call your pharmacy*   Lab Work: BNP- get lab drawn at Nephrology appt. (Bring lab slip) If you have labs (blood work) drawn today and your tests are completely normal, you will receive your results only by: [place] (if you have MyChart) OR A paper copy in the mail If you have any lab test that is abnormal or we need to change your treatment, we will call you to review the results.  Follow-Up: At [facility], you and your health needs are our priority.  As part of our continuing mission to provide you with exceptional heart care, we have created designated Provider Care Teams.  These Care Teams include your primary Cardiologist (physician) and Advanced Practice Providers (APPs -  Physician Assistants and Nurse Practitioners) who all work together to provide you with the care you need, when you need it.  We recommend signing up for the patient portal called "MyChart".  Sign up information is provided on this After Visit Summary.  MyChart is used to connect with patients for Virtual Visits (Telemedicine).  Patients are able to view lab/test results, encounter notes, upcoming appointments, etc.  Non-urgent messages can be sent to your provider as well.   To learn more about what you can do with MyChart, go to [email or web address].    Your next appointment:   08/17/22 at 0900   Provider:   [name], MD

## 2022-07-07 ENCOUNTER — Telehealth: Payer: Self-pay | Admitting: *Deleted

## 2022-07-07 DIAGNOSIS — I480 Paroxysmal atrial fibrillation: Secondary | ICD-10-CM

## 2022-07-07 DIAGNOSIS — Z5181 Encounter for therapeutic drug level monitoring: Secondary | ICD-10-CM

## 2022-07-07 DIAGNOSIS — Z953 Presence of xenogenic heart valve: Secondary | ICD-10-CM

## 2022-07-07 DIAGNOSIS — I5042 Chronic combined systolic (congestive) and diastolic (congestive) heart failure: Secondary | ICD-10-CM

## 2022-07-07 DIAGNOSIS — N184 Chronic kidney disease, stage 4 (severe): Secondary | ICD-10-CM

## 2022-07-07 NOTE — Telephone Encounter (Signed)
-----  Message from [name], MD sent at 07/06/2022  1:07 PM EST ----- Just realized he also needs a TSH.  He was going to have a BNP rechecked when he sees his nephrologist next month.  Can we please also send him a lab slip for TSH

## 2022-07-07 NOTE — Telephone Encounter (Signed)
Spoke to patient   Patient and wife aware to discard the lab slip they have presently.   Dr  Sallyanne Kuster would like for patient to have aBNP and TSH  done  together.   Patient state he will get lab work done at  the "[facility]" instead of at Nephrology office _ Dr Dolphus Jenny  Patient is aware a new lab slip will be placed in the mail   Please take lab slip to the lab,  Patient wife stated understanding, the stated they have discard the one they have now,  RN mailed lab slip

## 2022-08-07 ENCOUNTER — Telehealth: Payer: Self-pay | Admitting: Cardiovascular Disease

## 2022-08-07 MED ORDER — AMIODARONE HCL 200 MG PO TABS: 200.0000 mg | ORAL_TABLET | Freq: Two times a day (BID) | ORAL | 1 refills | Status: DC

## 2022-08-07 NOTE — Telephone Encounter (Signed)
*  STAT* If patient is at the pharmacy, call can be transferred to refill team.   1. Which medications need to be refilled? (please list name of each medication and dose if known) amiodarone (PACERONE) 200 MG tablet   2. Which pharmacy/location (including street and city if local pharmacy) is medication to be sent to? [facility] [place]   3. Do they need a 30 day or 90 day supply? 90 Days  Pt's spouse said there is only 2-3 pills left.

## 2022-08-07 NOTE — Telephone Encounter (Signed)
Refills has been sent to the pharmacy. 

## 2022-08-12 LAB — BRAIN NATRIURETIC PEPTIDE: BNP: 258.1 pg/mL — ABNORMAL HIGH (ref 0.0–100.0)

## 2022-08-12 LAB — TSH: TSH: 3.81 u[IU]/mL (ref 0.450–4.500)

## 2022-08-17 ENCOUNTER — Ambulatory Visit
Payer: No Typology Code available for payment source | Attending: Cardiovascular Disease | Admitting: Cardiovascular Disease

## 2022-08-17 ENCOUNTER — Encounter: Payer: Self-pay | Admitting: Cardiovascular Disease

## 2022-08-17 VITALS — BP 110/60 | HR 82 | Ht 71.5 in | Wt 265.4 lb

## 2022-08-17 DIAGNOSIS — I447 Left bundle-branch block, unspecified: Secondary | ICD-10-CM

## 2022-08-17 DIAGNOSIS — I25118 Atherosclerotic heart disease of native coronary artery with other forms of angina pectoris: Secondary | ICD-10-CM

## 2022-08-17 DIAGNOSIS — Z79899 Other long term (current) drug therapy: Secondary | ICD-10-CM

## 2022-08-17 DIAGNOSIS — N184 Chronic kidney disease, stage 4 (severe): Secondary | ICD-10-CM

## 2022-08-17 DIAGNOSIS — I480 Paroxysmal atrial fibrillation: Secondary | ICD-10-CM | POA: Diagnosis not present

## 2022-08-17 DIAGNOSIS — Z953 Presence of xenogenic heart valve: Secondary | ICD-10-CM

## 2022-08-17 DIAGNOSIS — I5042 Chronic combined systolic (congestive) and diastolic (congestive) heart failure: Secondary | ICD-10-CM | POA: Diagnosis not present

## 2022-08-17 DIAGNOSIS — I352 Nonrheumatic aortic (valve) stenosis with insufficiency: Secondary | ICD-10-CM

## 2022-08-17 DIAGNOSIS — E1122 Type 2 diabetes mellitus with diabetic chronic kidney disease: Secondary | ICD-10-CM

## 2022-08-17 DIAGNOSIS — Z5181 Encounter for therapeutic drug level monitoring: Secondary | ICD-10-CM

## 2022-08-17 DIAGNOSIS — J449 Chronic obstructive pulmonary disease, unspecified: Secondary | ICD-10-CM

## 2022-08-17 DIAGNOSIS — I050 Rheumatic mitral stenosis: Secondary | ICD-10-CM

## 2022-08-17 MED ORDER — AMIODARONE HCL 200 MG PO TABS: 200.0000 mg | ORAL_TABLET | Freq: Every day | ORAL | 3 refills | Status: AC

## 2022-08-17 NOTE — Patient Instructions (Signed)
Medication Instructions:  Decrease Amiodarone to '200mg'$  once a day *If you need a refill on your cardiac medications before your next appointment, please call your pharmacy*   Lab Work: CMET, BNP- today If you have labs (blood work) drawn today and your tests are completely normal, you will receive your results only by: [place] (if you have MyChart) OR A paper copy in the mail If you have any lab test that is abnormal or we need to change your treatment, we will call you to review the results.  Follow-Up: At [facility], you and your health needs are our priority.  As part of our continuing mission to provide you with exceptional heart care, we have created designated Provider Care Teams.  These Care Teams include your primary Cardiologist (physician) and Advanced Practice Providers (APPs -  Physician Assistants and Nurse Practitioners) who all work together to provide you with the care you need, when you need it.  We recommend signing up for the patient portal called "MyChart".  Sign up information is provided on this After Visit Summary.  MyChart is used to connect with patients for Virtual Visits (Telemedicine).  Patients are able to view lab/test results, encounter notes, upcoming appointments, etc.  Non-urgent messages can be sent to your provider as well.   To learn more about what you can do with MyChart, go to [email or web address].    Your next appointment:    3 months with APP  6 months with Dr Sallyanne Kuster

## 2022-08-17 NOTE — Progress Notes (Signed)
`   Cardiology Office Note    Date:  08/17/2022   ID:  Dominic [name], DOB Nov 26, 1950, MRN [id number]  PCP:  Gara Kroner, DO  Cardiologist:   [name], MD   No chief complaint on file.   History of Present Illness:  Dominic [name] is a 72 y.o. 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  This clinical situation seems to have stabilized somewhat.  He has not required any new visits to the emergency room or hospitalizations since his last appointment.  His weight has decreased slightly since the last appointment and he does not have any lower extremity edema, orthopnea or PND.  He has wheezing on and off some days worse than others.  He has not had chest pain.  He has significant exertional dyspnea after walking about 50-70 feet.  He does not have dyspnea at rest.  He has not had syncope or palpitations.  He remains in sinus rhythm today and is still on the high "loading" dose of amiodarone to 400 mg daily.  At home today he weighs 258.6 pounds (our office scale shows roughly 7 pounds more).  Most recent labs from 07/22/2022 show a creatinine of 3.56 and potassium of 3.9, elevated phosphorus of 5.2.  Most recent hemoglobin from January 17 was 13.1.  At his last appointment Dr. Oleta Mouse for [name] told him that his GFR was around 13.  His ECG shows sinus rhythm with a long first-degree AV block and a very broad QRS at 188 ms.  QTc is 570 ms, due to amiodarone therapy and a very broad left bundle branch block.  He has a history of severe LV dysfunction with normalization of LVEF following CRT-D.  As  recently as 10/29/2021 he had normal LVEF 55 to 60%.  His biventricular device was extracted due to MRSA bacteremia on 11/17/2021.  Echocardiogram 01/23/2022 showed that his EF had decreased to 45%.  During this current hospitalization echocardiogram 06/13/2021 showed reduction in LV ejection fraction to 30-35%.    Echocardiogram from recent hospitalization in [facility] in January 2024 shows unchanged valvular findings.  There is moderate aortic regurgitation and moderate aortic stenosis with a mean aortic valve gradient of 26 mmHg and a dimensionless index of 0.37 (last August mean gradient 22 mmHg and dimensionless index 0.43).  There is moderate bioprosthetic mitral stenosis with a mean gradient of 10 mmHg at a heart rate of 86 bpm (previously 9 mmHg at a heart rate of 80 bpm in August 2023).  During the recent hospitalization in [place] his creatinine improved from 3.84 on admission to 3.14 at discharge.  Of note, outpatient labs obtained 05/30/2022 by his nephrologist showed a creatinine of 4.02 (GFR 15).  He has a well-functioning right upper extremity AV fistula that he protects from blood pressure checks and blood draws.  Episode of MRSA bacteremia in April 2023 : he was hospitalized to [facility] on 09/27/2021 with sepsis and acute on chronic hypoxic respiratory failure and found to have MRSA bacteremia. He had persistent blood cultures despite treatment with vancomycin and cefepime and was subsequently switched to daptomycin and ceftaroline. There was suspicion of small vegetations on the mitral valve bioprosthesis, RV lead  and tricuspid valve.  He received 6 weeks of intravenous daptomycin and oral rifampin.  Hehad a follow-up TEE performed on 10/14/2021 at [facility] that did not show any evidence of vegetations on the prosthetic mitral valve or on the pacemaker leads and showed moderate aortic insufficiency.  The mean mitral valve gradient was 6 mmHg at a heart rate of 92 bpm.  He continued to feel ill,  poor appetite and weight loss.  TTE 11/06/2021 showed LVEF 55-60%, moderate aortic stenosis, moderate-severe aortic insufficiency, mean MVR gradient 6 mm Hg at 79 bpm.  He underwent laser lead and generator extraction of the BiV-ICD on 11/17/2021 by Dr. Romona Curls. Blood cultures 12/28/2021 at [facility] - no growth. Tunneled catheter was removed 12/22/2021.   His TEE on 04/26/2021 was interpreted as showing moderate AI. Transvalvular gradients were not reported, but the mean gradient was 20 mm Hg on TTE in October. The AI PHT was 350 ms (comparable to 326 ms on TTE). LVOT diameter on the TTE was overestimated at 2.8 cm. DVI was 0.39  - It is very challenging to measure his LVOT accurately due to shadowing from the mitral prosthetic annulus.  I think the LVOT actually measures around 2.3 cm, adjusting for this the actual calculated aortic valve area would be approximately 1.6 cm, consistent with mild-moderate aortic stenosis and moderate to severe AI.  Morphologically, he has a 3 leaflet aortic valve with moderate thickening and moderate calcification.  The noncoronary cusp on the left coronary cusp look relatively restrictive and motion.  I think there is enough degenerative change that his aortic valve could hold a TAVR stent.    For the first time in years he required hospitalization for congestive heart failure on 02/14/2021.  On presentation he was found to be in atrial fibrillation with rapid ventricular response.  He reports beginning to feel unwell 1 or 2 days before his hospitalization, with worsening exertional dyspnea.  He was not aware of any palpitations, but on arrival to the emergency room he had atrial fibrillation with RVR that did not respond to treatment with intravenous amiodarone and metoprolol so that he was eventually cardioverted with a single shock at 200 J on the day of admission.  He was started on Eliquis.  The notes from [facility] state that he was evaluated by nephrology while in  the hospital and that his renal function was stable.  He has not had angina pectoris since the addition of isosorbide mononitrate.  Nuclear stress test in April 2021 showed a small area of lateral wall ischemia.  Noninvasive/conservative approach recommended due to his renal failure.  His last coronary angiogram was performed in 2016 when he received 2 stents.  Mr. Lopes had severe ischemic cardiomyopathy with left ventricular ejection fraction estimated to be approximately 30% by echocardiogram performed in October 2016. That echo describes septal and apical akinesis with global hypokinesis. He had evidence of restrictive filling as well as moderate right ventricular systolic dysfunction and moderate aortic insufficiency. Echo in May 2017 (after PCI and CRT) shows improved LVEF 45-50% and normal function of the mitral valve prosthesis. Right heart catheterization in May 2017 showed right atrial pressure 8, mean wedge pressure 6, cardiac index 2.2 L/minute.   He has an extensive history of coronary artery disease. He tells me that he thinks he has had 9 cardiac catheterizations and 5 stents. At least on one admission he had 2 episodes of cardiac arrest. I think this was during his hospitalization for bypass surgery/valve  replacement. He underwent bypass surgery in [place] in 2015 with synchronous mitral valve replacement with a biological prosthesis. He has a history of previous stent placed in the LAD artery, type of stent and date of procedure unknown. He underwent placement of a bare metal stent to the ostium of the left circumflex coronary artery in September 2015. He subsequently underwent placement of 2 drug-eluting stents at [facility] in October 2016, placed in the right coronary artery and left circumflex coronary artery respectively. At the time of that cardiac catheterization he had 50% stenosis in the distal left main, 90% proximal LAD, 70% in-stent restenosis proximal LAD, patent LIMA to  distal LAD, 80% RCA. No mention is made of his second bypass graft, which I presume is occluded. He is on combination aspirin/clopidogrel.  At the time of his bypass surgery received a 31 mm [employer] mitral bioprosthetic valve. By echocardiogram last October 2016 the mean transvalvular gradient was 5 mmHg. The same echocardiogram described a trileaflet unrestricted aortic valve with moderate insufficiency.  His chart describes a history of postoperative atrial fibrillation. Amiodarone was stopped after several years without Afib, but this recurred in September 2022. Amiodarone and anticoagulation were restarted.  He has a history of left bundle branch block and he received a CRT-D [employer] device on 07/01/2015. The device and the leads are MRI conditional.  Following an episode of MRSA bacteremia his biventricular defibrillator generator and leads were all extracted on 11/17/2021.  He completed 6 weeks of intravenous antibiotics and had his tunneled catheter removed in early July 2023.  Blood cultures performed on 12/28/2021 were all negative.  Transesophageal and transthoracic echocardiograms performed in late May during MRSA bacteremia did not show any evidence of mitral valve prosthesis endocarditis and showed unchanged aortic stenosis and aortic insufficiency.  He has advanced chronic kidney disease. With recent baseline creatinine around 3.0-3.5. During his hospitalization in January 2017 the peak creatinine reached 4.6 (acute diarrheal illness), reaching a similar level in August 2017 when he had nausea and vomiting.[name] He has type 2 diabetes mellitus (not requiring insulin), complicated by kidney disease and neuropathy.  He takes atorvastatin in a relatively low dose. He has long-standing treated hypertension. He is a former smoker and has a history of COPD and takes bronchodilators. He has a history of migraine headaches treated with Fioricet. He underwent sigmoid colectomy in  November 2017 for colon cancer.  Past Medical History:  Diagnosis Date   Biventricular ICD (implantable cardioverter-defibrillator) - [facility] 09/05/2015   Jul 01, 2015 [facility] Dynagen X4   CAD (coronary artery disease) 09/05/2015   CABG 2015 PCI-BMS ostial LCX Sept 2015 04/05/2015 cath 50% distal LM, 90% prox LAD, old stent prox LAD 70% ISR, patent LIMA-LAD, 80% prox RCA PCI-DES x2 RCA and LCX    Chronic systolic CHF (congestive heart failure) ([place]) 09/05/2015   a. [place] 10/29/15: PCW 8, CI 2.2. Overall low filling presssures. Mean PA 20; b. echo 10/30/15: EF 45-50%, not tech suff to allwo for LV diastolic fxn, mild AI, nl appearing MVR   CKD stage 4 due to type 2 diabetes mellitus ([place]) 09/05/2015   Diabetes mellitus, type 2 ([place]) 09/05/2015   Essential hypertension 09/05/2015   History of mitral valve replacement with bioprosthetic valve 09/05/2015   31 mm Edwards 2015, Dr. Jerelene Redden   Postoperative atrial fibrillation ([place]) 09/05/2015    Past Surgical History:  Procedure Laterality Date   CARDIAC CATHETERIZATION N/A 10/29/2015   Procedure: Right Heart Cath;  Surgeon:  Jolaine Artist, MD;  Location: [facility];  Service: Cardiovascular;  Laterality: N/A;   CARDIAC VALVE REPLACEMENT  2015   12m Edwards bioprosthesis   CORONARY ANGIOPLASTY     CORONARY ARTERY BYPASS GRAFT  2015   [facility], Dr. MJerelene Redden   TEE WITHOUT CARDIOVERSION N/A 04/26/2021   Procedure: TRANSESOPHAGEAL ECHOCARDIOGRAM (TEE);  Surgeon: HPixie Casino MD;  [facility];  Service: Cardiovascular;  Laterality: N/A;    Current Medications: Outpatient Medications Prior to Visit  Medication Sig Dispense Refill   apixaban (ELIQUIS) 5 MG TABS tablet Take 5 mg by mouth in the morning and at bedtime.     atorvastatin (LIPITOR) 40 MG tablet Take 40 mg by mouth daily.     B Complex-C (SUPER B COMPLEX PO) Take 1 capsule by mouth daily.     Cholecalciferol (VITAMIN D) 50 MCG (2000 UT) tablet Take 2,000 Units by mouth daily.      docusate sodium (COLACE) 100 MG capsule Take 100 mg by mouth 2 (two) times daily.     DULoxetine (CYMBALTA) 60 MG capsule Take 60 mg by mouth daily.      famotidine (PEPCID) 20 MG tablet Take 10 mg by mouth daily.     ferrous sulfate 325 (65 FE) MG tablet Take 325 mg by mouth daily with breakfast.     fluticasone (FLONASE) 50 MCG/ACT nasal spray Place 1 spray into both nostrils daily as needed for allergies.     fluticasone-salmeterol (ADVAIR HFA) 45-21 MCG/ACT inhaler Inhale 2 puffs into the lungs 2 (two) times daily.     gabapentin (NEURONTIN) 100 MG capsule Take 100 mg by mouth 2 (two) times daily.     glipiZIDE (GLUCOTROL XL) 10 MG 24 hr tablet Take 10 mg by mouth daily.     glucose blood test strip 1 each by Other route as needed for other. Use as instructed     hydrALAZINE (APRESOLINE) 10 MG tablet Take 1 tablet (10 mg total) by mouth 3 (three) times daily. 270 tablet 1   HYDROcodone-acetaminophen (NORCO/VICODIN) 5-325 MG tablet Take 1 tablet by mouth every 6 (six) hours as needed for moderate pain.     hydrocortisone cream 1 % Apply 1 application  topically daily as needed for itching.     Insulin Glargine w/ Trans Port 100 UNIT/ML SOPN      Insulin Pen Needle (PEN NEEDLES 3/16") 31G X 5 MM MISC Use with insulin pen daily     isosorbide mononitrate (IMDUR) 30 MG 24 hr tablet Take 0.5 tablets (15 mg total) by mouth 2 (two) times daily. 90 tablet 3   LANTUS SOLOSTAR 100 UNIT/ML Solostar Pen Inject 30-40 Units into the skin 2 (two) times daily. Take 40 units in the morning and 30 units in the evening     metoprolol succinate (TOPROL XL) 25 MG 24 hr tablet Take 1.5 tablets (37.5 mg total) by mouth daily. 135 tablet 3   nitroGLYCERIN (NITROSTAT) 0.4 MG SL tablet Place 1 tablet (0.4 mg total) under the tongue every 5 (five) minutes as needed for chest pain. 25 tablet 6   polyethylene glycol powder (GLYCOLAX/MIRALAX) 17 GM/SCOOP powder Take 1 Container by mouth daily.     potassium chloride SA  (KLOR-CON M) 20 MEQ tablet Take 40 mEq by mouth 2 (two) times daily.     Spacer/Aero-Holding Chambers (EASIVENT) inhaler by Other route.     sucralfate (CARAFATE) 1 g tablet Take 1 g by mouth 3 (three) times daily.  TECHLITE PEN NEEDLES 31G X 5 MM MISC USE WITH INSULIN PEN DAILY     torsemide (DEMADEX) 20 MG tablet Take 3 tablets (60 mg total) by mouth in the morning and at bedtime. 540 tablet 3   amiodarone (PACERONE) 200 MG tablet Take 1 tablet (200 mg total) by mouth 2 (two) times daily. 180 tablet 1   acetaminophen (TYLENOL) 500 MG tablet Take 500 mg by mouth every 6 (six) hours as needed for moderate pain. (Patient not taking: Reported on 08/17/2022)     fluticasone-salmeterol (ADVAIR HFA) 45-21 MCG/ACT inhaler Inhale 1 puff into the lungs 2 (two) times daily. (Patient not taking: Reported on 08/17/2022)     potassium chloride SA (KLOR-CON) 20 MEQ tablet TAKE 2 TABLETS BY MOUTH THREE TIMES A DAY (Patient not taking: Reported on 08/17/2022) 180 tablet 5   sucralfate (CARAFATE) 1 g tablet Take 1 g by mouth 2 (two) times daily. (Patient not taking: Reported on 08/17/2022)     tiZANidine (ZANAFLEX) 2 MG tablet Take 2 mg by mouth 2 (two) times daily as needed for muscle spasms. (Patient not taking: Reported on 08/17/2022)     tiZANidine (ZANAFLEX) 2 MG tablet Take 2 mg by mouth every 8 (eight) hours as needed for muscle spasms. (Patient not taking: Reported on 08/17/2022)     No facility-administered medications prior to visit.     Allergies:   Hydrochlorothiazide, Amoxicillin-pot clavulanate, Clindamycin, Lisinopril, Meloxicam, Topiramate, and Tamsulosin   Social History   Socioeconomic History   Marital status: Married    Spouse name: Not on file   Number of children: Not on file   Years of education: Not on file   Highest education level: Not on file  Occupational History   Not on file  Tobacco Use   Smoking status: Some Days    Packs/day: 0.50    Years: 40.00    Total pack years: 20.00     Types: Cigarettes   Smokeless tobacco: Never   Tobacco comments:    01/02/2022- Patient smokes about 1/2 half pack daily    06/19/2022- Patient smokes about 10 cigarettes a day  Substance and Sexual Activity   Alcohol use: No    Alcohol/week: 0.0 standard drinks of alcohol   Drug use: No   Sexual activity: Not Currently  Other Topics Concern   Not on file  Social History Narrative   Not on file   Social Determinants of Health   Financial Resource Strain: Not on file  Food Insecurity: Not on file  Transportation Needs: Not on file  Physical Activity: Not on file  Stress: Not on file  Social Connections: Not on file     Family History:  The patient's family history includes Hyperlipidemia in his father; Hypertension in his father.   ROS:   Please see the history of present illness.    ROS All other systems reviewed and are negative.   PHYSICAL EXAM:   VS:  BP 110/60 (BP Location: Left Arm, Patient Position: Sitting, Cuff Size: Large)   Pulse 82   Ht 5' 11.5" (1.816 m)   Wt 265 lb 6.4 oz (120.4 kg)   SpO2 91%   BMI 36.50 kg/m      General: Alert, oriented x3, no distress, severely obese.  Smells strongly of cigarettes. Head: no evidence of trauma, PERRL, EOMI, no exophtalmos or lid lag, no myxedema, no xanthelasma; normal ears, nose and oropharynx Neck: normal jugular venous pulsations and no hepatojugular reflux; brisk carotid pulses without  delay and no carotid bruits Chest: clear to auscultation, no signs of consolidation by percussion or palpation, normal fremitus, symmetrical and full respiratory excursions Cardiovascular: normal position and quality of the apical impulse, regular rhythm, normal first and second heart sounds, 2-3/6 early peaking aortic ejection murmur, no diastolic murmurs, rubs or gallops.  AV fistula/graft in the right antecubital area has a strong pulse but rather weak thrill and bruit is heard only during systole. Abdomen: no tenderness or  distention, no masses by palpation, no abnormal pulsatility or arterial bruits, normal bowel sounds, no hepatosplenomegaly Extremities: no clubbing, cyanosis or edema; 2+ radial, ulnar and brachial pulses bilaterally; 2+ right femoral, posterior tibial and dorsalis pedis pulses; 2+ left femoral, posterior tibial and dorsalis pedis pulses; no subclavian or femoral bruits Neurological: grossly nonfocal Psych: Normal mood and affect      Wt Readings from Last 3 Encounters:  08/17/22 265 lb 6.4 oz (120.4 kg)  07/03/22 266 lb 3.2 oz (120.7 kg)  06/28/22 269 lb (122 kg)    Studies/Labs Reviewed:   11/08/21 TEE SUMMARY Left ventricular systolic function is normal.  The right ventricle is normal size.  Device lead in the right ventricle  There is fibrinous material noted on the device leads, not seen on  prior.  No thrombus is detected in the left atrial appendage.  Planimetry of the aortic valve in 2D/3D with a planimetred valve area  of 1.36 cm2, consistent with moderate aortic stenosis. There is mild  to moderate aortic regurgitation.  No evidence of vegetation on the aortic valve  There is a bioprosthetic mitral valve replacement (mean PG 3.7 mm Hg  at a HR 63). Notably, there is a prominent suture line on the lateral  aspect of the valve imaged at 0, 45, 90, and 135 degrees. There is no  evidence of valve degradation or peri-valvular leak that would suggest  endocarditis  There is no tricuspid valve vegetation.  There is no vegetation on the pulmonic valve.  There is no pericardial effusion.  Moderate atherosclerotic plaque(s) in the descending aorta.   In comparison to prior study dated 10/14/21, there is fibrinous material  seen on the device leads, not seen on prior imaging. In the correct  clinical context this could represent a device infection.       Echo 11/06/2021  The resolution of this study is inadequate for r/o endocardiitis  The left ventricle is mildly dilated.   Mild left ventricular hypertrophy.  LV ejection fraction = 55-60%.  Left ventricular systolic function is normal.  Left ventricular filling pattern is prolonged relaxation.  The right ventricle is normal in size and function.  Device lead in the right ventricle  The left atrium is moderately to severely dilated.  The right atrium is mildly dilated.  There is moderate aortic stenosis( likely overestimated due to  signifcant regurgitation).  There is moderate to severe aortic regurgitation.  There is a prosthetic mitral valve.  The mean gradient across the mitral valve is 6.2 mmHg.  The heart rate for the mean mitral valve gradient is 79 BPM.  There is mild tricuspid regurgitation.  The inferior vena cava was not visualized during the exam.  There is no pericardial effusion.   Echo 01/23/2022    1. Left ventricular ejection fraction, by estimation, is 45 to 50%. The  left ventricle has mildly decreased function. The left ventricle  demonstrates global hypokinesis. There is severe concentric left  ventricular hypertrophy. Left ventricular diastolic   function could  not be evaluated.   2. Right ventricular systolic function is mildly reduced. The right  ventricular size is normal. There is mildly elevated pulmonary artery  systolic pressure.   3. Left atrial size was mildly dilated.   4. The mitral valve has been repaired/replaced. Mild mitral valve  regurgitation. Possible stenosis of the bioprosthetic mitral valve. The  mean mitral valve gradient is 9.0 mmHg. (HR 80)  5. The aortic valve is tricuspid. There is moderate calcification of the  aortic valve. There is mild thickening of the aortic valve. Aortic valve  regurgitation is mild to moderate. Moderate aortic valve stenosis. Aortic  valve area, by VTI measures 1.34  cm. Aortic valve mean gradient measures 22.0 mmHg. Aortic valve Vmax  measures 3.04 m/s.   6. 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   7. The inferior vena cava is normal in size with greater than 50%  respiratory variability, suggesting right atrial pressure of 3 mmHg.   ECHO [facility] 06/13/2022  Left ventricular systolic function is moderate to severely reduced.  LV ejection fraction = 30-35%.  There is moderate global hypokinesis of the left ventricle.  Abnormal (paradoxical) septal motion consistent with LBBB.  The right ventricular systolic function is normal.  The left atrium is moderately to severely dilated.  There is moderate to severe aortic stenosis.  There is moderate aortic regurgitation.  There is a bioprosthetic mitral valve.  There is moderate mitral stenosis.  There is trace mitral regurgitation.  There is mild tricuspid regurgitation.  Mild pulmonary hypertension.  Estimated right ventricular systolic pressure is 50 mmHg.  There is no pericardial effusion.  Compared to the last study dated 0000000, LV systolic function has declined.  The aortic stenosis and mitral (prosthetic) stenosis have worsened.   The peak aortic valve velocity 378 cm/s. Aortic valve mean pressure gradient is 26 mmHg. AS dimensionless index (VTI) is .37.  The mean gradient across the mitral valve is 10 mmHg. The heart rate for the mean mitral valve gradient is 86 BPM. MV PHT 113.9 ms.    Myoview 10/10/2019 Nuclear stress EF: 49%. The left ventricular ejection fraction is mildly decreased (45-54%). Defect 1: There is a small defect of mild severity present in the basal anterolateral, mid anterolateral and apical lateral location. This is an intermediate risk study.   1. Reversible basal to apical anterolateral perfusion defect consistent with ischemia. 2. Septal hypokinesis, which may be due to paced rhythm  EKG:  EKG is not ordered today.  02/08/2022 tracing is personally reviewed and shows sinus rhythm, first-degree AV block, left bundle branch block (QRS 170 ms), QTc 544 ms  Recent  Labs: 06/28/2022: BUN 53; Creatinine, Ser 3.38; Hemoglobin 13.1; Platelets 222; Potassium 3.7; Sodium 136 08/11/2022: BNP 258.1; TSH 3.810  06/15/2022  Creatinine 3.14, BUN 77, potassium 3.8, hemoglobin 12.3, magnesium 2.4  06/12/2022 Creatinine 3.93, BUN 87, potassium 3.5, albumin 3.3, AST 42, ALT 55 BNP 479  There is no recent TSH  TSH was 3.37/normal 11/05/2021  Lipid Panel    Component Value Date/Time   CHOL 108 (L) 03/10/2016 0925   TRIG 228 (H) 03/10/2016 0925   HDL 28 (L) 03/10/2016 0925   CHOLHDL 3.9 03/10/2016 0925   VLDL 46 (H) 03/10/2016 0925   LDLCALC 34 03/10/2016 0925    05/25/2021 Total cholesterol 130, triglycerides 236, HDL 30, LDL 70    ASSESSMENT:    1. Chronic combined systolic and  diastolic heart failure (HCC)   2. Paroxysmal atrial fibrillation ([place])   3. LBBB (left bundle branch block)   4. History of mitral valve replacement with bioprosthetic valve   5. Moderate mitral stenosis   6. Nonrheumatic aortic insufficiency with aortic stenosis   7. Coronary artery disease of native artery of native heart with stable angina pectoris ([place])   8. Chronic obstructive pulmonary disease, unspecified COPD type ([place])   9. PAF (paroxysmal atrial fibrillation) ([place])   10. Encounter for monitoring amiodarone therapy   11. CKD stage 4 due to type 2 diabetes mellitus ([place])      PLAN:  In order of problems listed above:  CHF: NYHA functional class III with dyspnea caused by both pulmonary and cardiac illness.  Following extraction of his CRT device he has had drastic worsening of left ventricular systolic function due to severe dyssynchrony.  I think he is currently is close to euvolemia as we can get him.  Set a target "dry weight" of 262 pounds on his home scale.  He is to call our office if he ever reaches or exceeds that weight.  He really does not do as well on furosemide as he does on torsemide.   Unable to treat with RAAS inhibitors/ARNI/SGLT2 inhibitors due to  the severity of his kidney disease.  Blood pressure only allows a low-dose of hydralazine nitrates.  Reevaluate clinically and check BMP/BNP today.  Tenuous balance between heart failure and renal failure, but renal function may have to be sacrificed in favor of his heart.  He does have a working dialysis access already.  LBBB: Unfortunately, our electrophysiology team thinks that his risk of device reinfection is prohibitive.  He has a very broad left bundle branch block.  He was a CRT "hyper responder" to improve his EF from 30% to 55-60% after institution of CRT and appropriate medical therapy.  After his biventricular pacemaker was extracted, it is not a surprise that his LVEF has decreased and is now back to his previous baseline.  It is likely he will continue to deteriorate without cardiac resynchronization therapy.  He is a poor candidate for implantation of his CRT device.  I checked with one of our electrophysiology colleagues who states that the risk of device reinfection if it is implanted ipsilaterally is as high as 25%.  Cannot implant the device on the opposite side which is his dialysis access site.  The risk of complications outweighs the benefit of the device. History of endocarditis: Clinically he appears to have recovered from endocarditis which occurred in the spring/summer 2023.  This is despite the fact that he still has his bioprosthetic mitral valve which could very well have been seeded at the time of bacteremia.   MVR stenosis: I cannot hear a rumble on exam but he is very obese and has emphysema as well.  Mitral prosthetic gradients are stable compared to August 2023, but substantially higher than his baseline in November 22, which suggest some degree of permanent injury to the prosthetic valve following his episode of endocarditis.  He is not a candidate for surgical mitral valve replacement.  Could consider TMVR valve in valve.  AS/AI: His murmur of aortic stenosis is early peaking.   During his last hospitalization his echocardiogram showed increasing transaortic gradients, but the dimensionless index was unchanged suggesting that the increased gradients were due to high cardiac output in the setting of infection.  Similarly, I do not think he will ever be a candidate for  surgical AVR, but TAVR can be considered.  Since he is not yet on dialysis, workup for TAVR will be complicated due to the risk of contrast nephrotoxicity. CAD: Does not have angina.  He is on beta-blocker and statin but not on aspirin due to full anticoagulation with apixaban. COPD: Clearly this is also a major contributor to his shortness of breath.  Even when his heart failure was well compensated and ejection fraction had returned to normal he still has NYHA functional class III dyspnea which is largely due to emphysema.  Unfortunately he continues to smoke.  Strongly recommended smoking cessation for both [place] and his wife. Paroxysmal atrial fibrillation: Reduce amiodarone to 200 mg once daily.  Has remained in sinus rhythm since we restarted amiodarone in September 2022.  CHA2DS2-VASc 5 (age, HTN, DM, CHF, CAD).  We no longer have the benefit of using his cardiac rhythm device to detect periods of atrial fibrillation.  I encouraged him to consider purchasing a commercially available electrical monitoring device such as Kardia. Amiodarone: Monitor for side effects.  He had a normal TSH just a few days ago.  He had normal liver function tests in January. Anticoagulation: No bleeding complications.  The current dose of apixaban remains appropriate even in the face of his renal dysfunction due to his body size and age..  CKD stage 4: Severe renal dysfunction, but relatively stable in the last few months.  GFR around 15.  He has a mature AV fistula that has not yet been used for dialysis (on exam today I am a little worried that it may have some degree of stenosis).  His nephrologist is Dr. [name]. HTN: Controlled;   BP does not allow additional vasodilators for heart failure. DM: Fair glucose control.  Most recent hemoglobin A1c 7.2% HLP: On atorvastatin.  Most recent LDL was 34.  Chronically low HDL 28.  Mildly elevated triglycerides.  I would not recommend independent therapy for his hypertriglyceridemia since he is already on multiple medications and risk for drug reactions.  Target LDL less than 70. Peripheral venous insufficiency/lymphedema: He has prominent varicose veins and has had cellulitis in his left lower extremity, which is always a little swollen.  Peripheral edema is not a good way to assess his volume status currently without any evidence of edema. Severe obesity: Had a lot of variations in weight last year during his episode of endocarditis between May and August, but seems to have stabilized at about 260 pounds on his home scale.  He is overall at high risk for serious complications and poor outcome due to multiple serious chronic illnesses.  Medication Adjustments/Labs and Tests Ordered: Current medicines are reviewed at length with the patient today.  Concerns regarding medicines are outlined above.  Medication changes, Labs and Tests ordered today are listed in the Patient Instructions below. Patient Instructions  Medication Instructions:  Decrease Amiodarone to '200mg'$  once a day *If you need a refill on your cardiac medications before your next appointment, please call your pharmacy*   Lab Work: CMET, BNP- today If you have labs (blood work) drawn today and your tests are completely normal, you will receive your results only by: [place] (if you have MyChart) OR A paper copy in the mail If you have any lab test that is abnormal or we need to change your treatment, we will call you to review the results.  Follow-Up: At [facility], you and your health needs are our priority.  As part of our continuing  mission to provide you with exceptional heart care, we have  created designated Provider Care Teams.  These Care Teams include your primary Cardiologist (physician) and Advanced Practice Providers (APPs -  Physician Assistants and Nurse Practitioners) who all work together to provide you with the care you need, when you need it.  We recommend signing up for the patient portal called "MyChart".  Sign up information is provided on this After Visit Summary.  MyChart is used to connect with patients for Virtual Visits (Telemedicine).  Patients are able to view lab/test results, encounter notes, upcoming appointments, etc.  Non-urgent messages can be sent to your provider as well.   To learn more about what you can do with MyChart, go to [email or web address].    Your next appointment:    3 months with APP  6 months with Dr Sallyanne Kuster      Signed, [name], MD  08/17/2022 3:45 PM    [facility] [address], [place], [place]  [address] Phone: [phone]; Fax: [phone]

## 2022-08-18 ENCOUNTER — Telehealth: Payer: Self-pay | Admitting: Emergency Medicine

## 2022-08-18 LAB — COMPREHENSIVE METABOLIC PANEL
ALT: 67 IU/L — ABNORMAL HIGH (ref 0–44)
AST: 55 IU/L — ABNORMAL HIGH (ref 0–40)
Albumin/Globulin Ratio: 1.1 — ABNORMAL LOW (ref 1.2–2.2)
Albumin: 3.7 g/dL — ABNORMAL LOW (ref 3.8–4.8)
Alkaline Phosphatase: 157 IU/L — ABNORMAL HIGH (ref 44–121)
BUN/Creatinine Ratio: 20 (ref 10–24)
BUN: 75 mg/dL — ABNORMAL HIGH (ref 8–27)
Bilirubin Total: 0.2 mg/dL (ref 0.0–1.2)
CO2: 20 mmol/L (ref 20–29)
Calcium: 9.1 mg/dL (ref 8.6–10.2)
Chloride: 95 mmol/L — ABNORMAL LOW (ref 96–106)
Creatinine, Ser: 3.76 mg/dL — ABNORMAL HIGH (ref 0.76–1.27)
Globulin, Total: 3.4 g/dL (ref 1.5–4.5)
Glucose: 163 mg/dL — ABNORMAL HIGH (ref 70–99)
Potassium: 3.7 mmol/L (ref 3.5–5.2)
Sodium: 136 mmol/L (ref 134–144)
Total Protein: 7.1 g/dL (ref 6.0–8.5)
eGFR: 16 mL/min/{1.73_m2} — ABNORMAL LOW (ref >59)

## 2022-08-18 LAB — BRAIN NATRIURETIC PEPTIDE: BNP: 177.7 pg/mL — ABNORMAL HIGH (ref 0.0–100.0)

## 2022-08-18 NOTE — Telephone Encounter (Signed)
Labs confirm the clinical impression that he is doing better from a heart failure point of view, BNP is probably at new baseline (unlikely to return fully back to normal after he had his biventricular pacemaker extracted). Kidney function is just slightly worse than a month ago with a creatinine of 3.76.  Electrolytes are okay.  Mildly abnormal AST and ALT may be due to passive congestion, but need to repeat them since he is on amiodarone.  Please repeat a complete metabolic panel in 1 month.  Ideally, this can also be done when he has his next nephrology visit with Dr. [name] (just get a complete metabolic panel rather than a renal functional panel, please).  Called and spoke with patient and his wife- on speaker. Gave the above information. He plans to call and make an appointment with Nephrologist (within a month) after we get off the phone and will ask for Complete Metabolic as recommended by Dr Sallyanne Kuster. Dr Sallyanne Kuster also sent a message to his nephrologist requesting a CMET be done at next visit. Told to call with any questions/concerns.

## 2022-11-14 ENCOUNTER — Ambulatory Visit: Payer: No Typology Code available for payment source | Attending: Physician Assistant | Admitting: Physician Assistant

## 2022-11-14 ENCOUNTER — Encounter: Payer: Self-pay | Admitting: Physician Assistant

## 2022-11-14 VITALS — BP 130/64 | HR 86 | Ht 71.5 in | Wt 264.4 lb

## 2022-11-14 DIAGNOSIS — Z953 Presence of xenogenic heart valve: Secondary | ICD-10-CM

## 2022-11-14 DIAGNOSIS — N184 Chronic kidney disease, stage 4 (severe): Secondary | ICD-10-CM

## 2022-11-14 DIAGNOSIS — I5042 Chronic combined systolic (congestive) and diastolic (congestive) heart failure: Secondary | ICD-10-CM | POA: Diagnosis not present

## 2022-11-14 DIAGNOSIS — I480 Paroxysmal atrial fibrillation: Secondary | ICD-10-CM

## 2022-11-14 DIAGNOSIS — I255 Ischemic cardiomyopathy: Secondary | ICD-10-CM

## 2022-11-14 DIAGNOSIS — Z7984 Long term (current) use of oral hypoglycemic drugs: Secondary | ICD-10-CM

## 2022-11-14 DIAGNOSIS — R079 Chest pain, unspecified: Secondary | ICD-10-CM | POA: Diagnosis not present

## 2022-11-14 DIAGNOSIS — E1122 Type 2 diabetes mellitus with diabetic chronic kidney disease: Secondary | ICD-10-CM

## 2022-11-14 DIAGNOSIS — J449 Chronic obstructive pulmonary disease, unspecified: Secondary | ICD-10-CM

## 2022-11-14 MED ORDER — ISOSORBIDE MONONITRATE ER 60 MG PO TB24: 60.0000 mg | ORAL_TABLET | Freq: Every day | ORAL | 3 refills | Status: DC

## 2022-11-14 NOTE — Progress Notes (Unsigned)
Cardiology Office Note:    Date:  11/16/2022   ID:  Dominic [name], DOB 27-Jul-1950, MRN [id number]  PCP:  Montez Hageman, DO   [facility] Providers Cardiologist:  Thurmon Fair, MD     Referring MD: Montez Hageman, DO   Chief Complaint  Patient presents with   Follow-up    Seen for Dr. Royann Shivers    History of Present Illness:    Dominic [name] is a 72 y.o. 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  He previously underwent coronary angiography with 2 stent placement in 2016.  EF was 30% by echocardiogram in October 2016.  Myoview in April 2021 showed small area of lateral wall ischemia, medical management was recommended due to renal failure.  He was hospitalized in September 2022 with atrial fibrillation with RVR and eventually underwent cardioversion.  His EF improved after CRT-D placement to 55 to 60% by May 2023.  He was hospitalized and [facility] in April 2023 with sepsis and acute on chronic respiratory failure and found to have MRSA bacteremia.  There was suspicion of small vegetation on the mitral valve prosthesis, RV lead and tricuspid valve.  He received 6 weeks of daptomycin and oral rifampin.  Follow-up TEE performed on 10/14/2021 at [facility] did not show any evidence of vegetation on the prosthetic mitral valve or pacemaker lead, however does reveal moderate aortic insufficiency.  Biventricular device was explanted due to MRSA bacteremia on 11/17/2021.  Echocardiogram in August 2023 showed EF has decreased to 45%.  Most recent echocardiogram obtained at [facility] in January 2024 showed EF down to 30 to 35%, LBBB, moderate to severe aortic stenosis, moderate to severely enlarged left atrium, normal  RV, bioprosthetic mitral valve with moderate mitral stenosis, RV systolic pressure 50 mmHg.  He was last seen by Dr. Royann Shivers in March 2024, he was found to be euvolemic near his dry weight of 262 pounds on home scale.  Although his EF has continued to drop after explant of CRT-D device, according to the previous note, our EP team thinks his risk of device reinfection is prohibitive.  Patient presents for follow-up along with wife.  His weight has really unchanged.  He does not do any activity.  According to his wife, he has been having chronic chest pain that is increasing lately.  Patient says his chest pain has been going on for a while but he does not like to mention it.  In the past 2 weeks, he is essentially having chest pain every other day.  He has taken nitroglycerin 4 times in 2 weeks.  He does not want to go to the emergency room even with prolonged chest discomfort.  He is not a good cath candidate due to baseline creatinine of greater than 3.  I will increase his Imdur to 60 mg daily.  I plan to see the patient back in 5 to 6 weeks on day Dr. Royann Shivers is also here.  Past Medical History:  Diagnosis Date   Biventricular ICD (implantable cardioverter-defibrillator) - [facility] 09/05/2015   Jul 01, 2015 [facility] Dynagen X4   CAD (coronary artery disease) 09/05/2015   CABG 2015 PCI-BMS ostial LCX Sept 2015 04/05/2015 cath 50% distal LM, 90% prox LAD, old stent prox  LAD 70% ISR, patent LIMA-LAD, 80% prox RCA PCI-DES x2 RCA and LCX    Chronic systolic CHF (congestive heart failure) (HCC) 09/05/2015   a. RHC 10/29/15: PCW 8, CI 2.2. Overall low filling presssures. Mean PA 20; b. echo 10/30/15: EF 45-50%, not tech suff to allwo for LV diastolic fxn, mild AI, nl appearing MVR   CKD stage 4 due to type 2 diabetes mellitus (HCC) 09/05/2015   Diabetes mellitus, type 2 (HCC) 09/05/2015   Essential hypertension 09/05/2015   History of mitral valve replacement with bioprosthetic valve 09/05/2015   31 mm Edwards 2015, Dr.  Arvilla Market   Postoperative atrial fibrillation (HCC) 09/05/2015    Past Surgical History:  Procedure Laterality Date   CARDIAC CATHETERIZATION N/A 10/29/2015   Procedure: Right Heart Cath;  Surgeon: Dolores Patty, MD;  [facility]VAS[facility] CV LAB;  Service: Cardiovascular;  Laterality: N/A;   CARDIAC VALVE REPLACEMENT  2015   31mm Edwards bioprosthesis   CORONARY ANGIOPLASTY     CORONARY ARTERY BYPASS GRAFT  2015   [facility], Dr. Arvilla Market.   TEE WITHOUT CARDIOVERSION N/A 04/26/2021   Procedure: TRANSESOPHAGEAL ECHOCARDIOGRAM (TEE);  Surgeon: Chrystie Nose, MD;  Location: [facility] ENDOSCOPY;  Service: Cardiovascular;  Laterality: N/A;    Current Medications: Current Meds  Medication Sig   isosorbide mononitrate (IMDUR) 60 MG 24 hr tablet Take 1 tablet (60 mg total) by mouth daily.     Allergies:   Hydrochlorothiazide, Amoxicillin-pot clavulanate, Clindamycin, Lisinopril, Meloxicam, Topiramate, and Tamsulosin   Social History   Socioeconomic History   Marital status: Married    Spouse name: Not on file   Number of children: Not on file   Years of education: Not on file   Highest education level: Not on file  Occupational History   Not on file  Tobacco Use   Smoking status: Some Days    Packs/day: 0.50    Years: 40.00    Additional pack years: 0.00    Total pack years: 20.00    Types: Cigarettes   Smokeless tobacco: Never   Tobacco comments:    01/02/2022- Patient smokes about 1/2 half pack daily    06/19/2022- Patient smokes about 10 cigarettes a day  Substance and Sexual Activity   Alcohol use: No    Alcohol/week: 0.0 standard drinks of alcohol   Drug use: No   Sexual activity: Not Currently  Other Topics Concern   Not on file  Social History Narrative   Not on file   Social Determinants of Health   Financial Resource Strain: Not on file  Food Insecurity: Not on file  Transportation Needs: Not on file  Physical Activity: Not on file  Stress: Not on file  Social  Connections: Not on file     Family History: The patient's family history includes Hyperlipidemia in his father; Hypertension in his father.  ROS:   Please see the history of present illness.     All other systems reviewed and are negative.  EKGs/Labs/Other Studies Reviewed:    The following studies were reviewed today:  Echo 06/13/2022 SUMMARY  Left ventricular systolic function is moderate to severely reduced.  LV ejection fraction = 30-35%.  There is moderate global hypokinesis of the left ventricle.  Abnormal (paradoxical) septal motion consistent with LBBB.  The right ventricular systolic function is normal.  The left atrium is moderately to severely dilated.  There is moderate to severe aortic stenosis.  There is moderate aortic regurgitation.  There is a bioprosthetic mitral  valve.  There is moderate mitral stenosis.  There is trace mitral regurgitation.  There is mild tricuspid regurgitation.  Mild pulmonary hypertension.  Estimated right ventricular systolic pressure is 50 mmHg.  There is no pericardial effusion.  Compared to the last study dated 11/06/2021, LV systolic function has declined.  The aortic stenosis and mitral (prosthetic) stenosis have worsened.   EKG:  EKG is not ordered today.    Recent Labs: 06/28/2022: Hemoglobin 13.1; Platelets 222 08/11/2022: TSH 3.810 08/17/2022: ALT 67; BNP 177.7; BUN 75; Creatinine, Ser 3.76; Potassium 3.7; Sodium 136  Recent Lipid Panel    Component Value Date/Time   CHOL 108 (L) 03/10/2016 0925   TRIG 228 (H) 03/10/2016 0925   HDL 28 (L) 03/10/2016 0925   CHOLHDL 3.9 03/10/2016 0925   VLDL 46 (H) 03/10/2016 0925   LDLCALC 34 03/10/2016 0925     Risk Assessment/Calculations:    CHA2DS2-VASc Score = 4   This indicates a 4.8% annual risk of stroke. The patient's score is based upon: CHF History: 1 HTN History: 0 Diabetes History: 1 Stroke History: 0 Vascular Disease History: 1 Age Score: 1 Gender Score: 0           Physical Exam:    VS:  BP 130/64   Pulse 86   Ht 5' 11.5" (1.816 m)   Wt 264 lb 6.4 oz (119.9 kg)   SpO2 94%   BMI 36.36 kg/m        Wt Readings from Last 3 Encounters:  11/14/22 264 lb 6.4 oz (119.9 kg)  08/17/22 265 lb 6.4 oz (120.4 kg)  07/03/22 266 lb 3.2 oz (120.7 kg)     GEN:  Well nourished, well developed in no acute distress HEENT: Normal NECK: No JVD; No carotid bruits LYMPHATICS: No lymphadenopathy CARDIAC: RRR, no murmurs, rubs, gallops RESPIRATORY:  Clear to auscultation without rales, wheezing or rhonchi  ABDOMEN: Soft, non-tender, non-distended MUSCULOSKELETAL:  No edema; No deformity  SKIN: Warm and dry NEUROLOGIC:  Alert and oriented x 3 PSYCHIATRIC:  Normal affect   ASSESSMENT:    1. Chest pain of uncertain etiology   2. Chronic combined systolic and diastolic heart failure (HCC)   3. History of mitral valve replacement with bioprosthetic valve   4. Ischemic cardiomyopathy   5. Chronic obstructive pulmonary disease, unspecified COPD type (HCC)   6. PAF (paroxysmal atrial fibrillation) (HCC)   7. CKD stage 4 due to type 2 diabetes mellitus (HCC)    PLAN:    In order of problems listed above:  Chest pain: According to his wife, he has chronic chest pain that has been worsening lately.  In the past 2 weeks, he is essentially having chest pain every other day.  He has taken nitroglycerin 4 times.  Wife says he has had some chest discomfort for a long time however never really mentioned it to his doctor before.  When discussing the chest discomfort with the patient, he says he really did not want to talk about it.  He is certain that he does not want risk dialysis at this time.  He is option is quite limited given CKD stage IV with creatinine greater than 3, I recommend increase Imdur to 60 mg daily.  I will reassess him in a few weeks.  Chronic combined systolic and diastolic heart failure: He was a hyper responder to CRT-D, unfortunately CRT-D was  removed due to bacteremia.  Since then, his EF has continued to decline.  He appears to be  euvolemic on exam today.  History of mitral valve replacement: Stable on last echocardiogram  Ischemic cardiomyopathy: On hydralazine/Imdur and metoprolol succinate  COPD: No acute exacerbation  PAF: On amiodarone and Eliquis  CKD stage IV: Baseline creatinine greater than 3.           Medication Adjustments/Labs and Tests Ordered: Current medicines are reviewed at length with the patient today.  Concerns regarding medicines are outlined above.  No orders of the defined types were placed in this encounter.  Meds ordered this encounter  Medications   isosorbide mononitrate (IMDUR) 60 MG 24 hr tablet    Sig: Take 1 tablet (60 mg total) by mouth daily.    Dispense:  90 tablet    Refill:  3    Patient Instructions  Medication Instructions:  Increase Imdur to 60 mg ( 1Tablet Daily). *If you need a refill on your cardiac medications before your next appointment, please call your pharmacy*   Lab Work: No Labs If you have labs (blood work) drawn today and your tests are completely normal, you will receive your results only by: MyChart Message (if you have MyChart) OR A paper copy in the mail If you have any lab test that is abnormal or we need to change your treatment, we will call you to review the results.   Testing/Procedures: No Testing   Follow-Up: At [facility], you and your health needs are our priority.  As part of our continuing mission to provide you with exceptional heart care, we have created designated Provider Care Teams.  These Care Teams include your primary Cardiologist (physician) and Advanced Practice Providers (APPs -  Physician Assistants and Nurse Practitioners) who all work together to provide you with the care you need, when you need it.  We recommend signing up for the patient portal called "MyChart".  Sign up information is provided on this After  Visit Summary.  MyChart is used to connect with patients for Virtual Visits (Telemedicine).  Patients are able to view lab/test results, encounter notes, upcoming appointments, etc.  Non-urgent messages can be sent to your provider as well.   To learn more about what you can do with MyChart, go to [email or web address].    Your next appointment:   5-6 week(s)  Provider:   Azalee Course, PA-C   Other Instructions Recommended if Chest Pain Worsens Go To Emergency Room.   Ramond Dial, [place]  11/16/2022 2:08 PM    [facility]

## 2022-11-14 NOTE — Patient Instructions (Signed)
Medication Instructions:  Increase Imdur to 60 mg ( 1Tablet Daily). *If you need a refill on your cardiac medications before your next appointment, please call your pharmacy*   Lab Work: No Labs If you have labs (blood work) drawn today and your tests are completely normal, you will receive your results only by: MyChart Message (if you have MyChart) OR A paper copy in the mail If you have any lab test that is abnormal or we need to change your treatment, we will call you to review the results.   Testing/Procedures: No Testing   Follow-Up: At [facility], you and your health needs are our priority.  As part of our continuing mission to provide you with exceptional heart care, we have created designated Provider Care Teams.  These Care Teams include your primary Cardiologist (physician) and Advanced Practice Providers (APPs -  Physician Assistants and Nurse Practitioners) who all work together to provide you with the care you need, when you need it.  We recommend signing up for the patient portal called "MyChart".  Sign up information is provided on this After Visit Summary.  MyChart is used to connect with patients for Virtual Visits (Telemedicine).  Patients are able to view lab/test results, encounter notes, upcoming appointments, etc.  Non-urgent messages can be sent to your provider as well.   To learn more about what you can do with MyChart, go to [email or web address].    Your next appointment:   5-6 week(s)  Provider:   Azalee Course, PA-C   Other Instructions Recommended if Chest Pain Worsens Go To Emergency Room.

## 2022-11-16 ENCOUNTER — Encounter: Payer: Self-pay | Admitting: Physician Assistant

## 2022-11-24 ENCOUNTER — Telehealth: Payer: Self-pay | Admitting: Cardiovascular Disease

## 2022-11-24 NOTE — Telephone Encounter (Signed)
Pt spouse called in stating the pt wanted to inform Dr. Royann Shivers that he went to the hospital last Saturday because he had a heart attack and now he is on oxygen.

## 2022-12-18 ENCOUNTER — Other Ambulatory Visit: Payer: Self-pay

## 2022-12-18 MED ORDER — HYDRALAZINE HCL 10 MG PO TABS: 10.0000 mg | ORAL_TABLET | Freq: Three times a day (TID) | ORAL | 3 refills | Status: DC

## 2022-12-19 ENCOUNTER — Telehealth: Payer: Self-pay | Admitting: Cardiovascular Disease

## 2022-12-19 NOTE — Telephone Encounter (Signed)
Patient states 5:30 am low BP, Some dizziness and SOB. Resolved after 30 minutes. Takes his BP with wrist cuff.   7/9  91/74 7/8  102/71  7/7  120/71    PCP told patient to stop the hydralazine today at appt.  His BP was  118/71 at appt.   Please advise if any further recommendations.  Has appt on the 12th with PA and will bring BP log to appt.

## 2022-12-19 NOTE — Telephone Encounter (Signed)
Pt c/o medication issue:  1. Name of Medication:  hydrALAZINE (APRESOLINE) 10 MG tablet   2. How are you currently taking this medication (dosage and times per day)?   3. Are you having a reaction (difficulty breathing--STAT)?   4. What is your medication issue?   Cherrie Gauze, care manager with [facility] states Hydralazine has been discontinued by PCP. She had a televisit with patient and his BP was 91/74.  Patient saw PCP today and BP was 112/81.  Patient mentioned dizziness and occasional CP (lasts no longer than 1-3 minutes) + patient has not taken nitroglycerin. Please call patient to advise on Metolazone.

## 2022-12-22 ENCOUNTER — Encounter: Payer: Self-pay | Admitting: Physician Assistant

## 2022-12-22 ENCOUNTER — Ambulatory Visit: Payer: No Typology Code available for payment source | Attending: Physician Assistant | Admitting: Physician Assistant

## 2022-12-22 VITALS — BP 100/60 | HR 89 | Ht 69.0 in | Wt 267.6 lb

## 2022-12-22 DIAGNOSIS — I5042 Chronic combined systolic (congestive) and diastolic (congestive) heart failure: Secondary | ICD-10-CM | POA: Diagnosis not present

## 2022-12-22 DIAGNOSIS — R079 Chest pain, unspecified: Secondary | ICD-10-CM

## 2022-12-22 DIAGNOSIS — I330 Acute and subacute infective endocarditis: Secondary | ICD-10-CM

## 2022-12-22 DIAGNOSIS — B958 Unspecified staphylococcus as the cause of diseases classified elsewhere: Secondary | ICD-10-CM

## 2022-12-22 DIAGNOSIS — Z7984 Long term (current) use of oral hypoglycemic drugs: Secondary | ICD-10-CM

## 2022-12-22 DIAGNOSIS — Z953 Presence of xenogenic heart valve: Secondary | ICD-10-CM

## 2022-12-22 DIAGNOSIS — I255 Ischemic cardiomyopathy: Secondary | ICD-10-CM

## 2022-12-22 DIAGNOSIS — E1122 Type 2 diabetes mellitus with diabetic chronic kidney disease: Secondary | ICD-10-CM

## 2022-12-22 DIAGNOSIS — I447 Left bundle-branch block, unspecified: Secondary | ICD-10-CM | POA: Diagnosis not present

## 2022-12-22 DIAGNOSIS — N184 Chronic kidney disease, stage 4 (severe): Secondary | ICD-10-CM

## 2022-12-22 DIAGNOSIS — I480 Paroxysmal atrial fibrillation: Secondary | ICD-10-CM

## 2022-12-22 DIAGNOSIS — J449 Chronic obstructive pulmonary disease, unspecified: Secondary | ICD-10-CM

## 2022-12-22 NOTE — Progress Notes (Unsigned)
Cardiology Office Note:  .   Date:  12/24/2022  ID:  [name], DOB [birth year], MRN [id number] PCP: Montez Hageman, DO  [facility] Providers Cardiologist:  Thurmon Fair, MD     History of Present Illness: .   Gregoire Favinger is a 72 y.o. 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  He previously underwent coronary angiography with 2 stent placement in 2016.  EF was 30% by echocardiogram in October 2016.  Myoview in April 2021 showed small area of lateral wall ischemia, medical management was recommended due to renal failure.  He was hospitalized in September 2022 with atrial fibrillation with RVR and eventually underwent cardioversion.  His EF improved after CRT-D placement to 55 to 60% by May 2023.  He was hospitalized and [facility] in April 2023 with sepsis and acute on chronic respiratory failure and found to have MRSA bacteremia.  There was suspicion of small vegetation on the mitral valve prosthesis, RV lead and tricuspid valve.  He received 6 weeks of daptomycin and oral rifampin.  Follow-up TEE performed on 10/14/2021 at [facility] did not show any evidence of vegetation on the prosthetic mitral valve or pacemaker lead, however does reveal moderate aortic insufficiency.  Biventricular device was explanted due to MRSA bacteremia on 11/17/2021.  Echocardiogram in August 2023 showed EF has decreased to 45%.  Most recent echocardiogram obtained at [facility] in January 2024 showed EF down to 30 to 35%, LBBB, moderate to severe aortic stenosis, moderate to severely enlarged left atrium, normal RV, bioprosthetic mitral valve with moderate mitral stenosis, RV systolic pressure 50 mmHg.   He was last seen by Dr. Royann Shivers in  March 2024, he was found to be euvolemic near his dry weight of 262 pounds on home scale.  Although his EF has continued to drop after explant of CRT-D device, according to the previous note, our EP team thinks his risk of device reinfection is prohibitive.  I saw the patient on 11/14/2022 at which time his wife was concerned about his chronic chest pain has been increasing.  He did not wish to go to the emergency room despite the prolonged chest discomfort.  He was not considered a good cath candidate due to baseline creatinine greater than 3.  I increased his Imdur to 60 mg daily.  She was admitted at [facility] hospital on 11/18/2022 with acute respiratory failure and chest discomfort.  BNP elevated at 462.  He was treated with IV diuretic.  Repeat x-ray showed improved pulmonary edema.  O2 saturation was in the 80s and that he was discharged on 2 L nasal cannula.  Serial troponin remains elevated but flat during the hospitalization, 45-->47-->40.  His torsemide was increased from 60 mg twice daily to 80 mg twice daily dosing.  He was also given albuterol as well.  Most recent blood work performed on 11/27/2022 showed creatinine of 3.18.  Most recent note by PCP on 12/19/2022 revealed that his hydralazine was removed due to low blood pressure.  Patient presents today for posthospital follow-up.  He continued to have intermittent chest discomfort.  He does not have significant lower extremity edema.  Pulmonary exam showed diminished breath sound.  I discussed his case with Dr. Royann Shivers, I do not think I can  further uptitrate his Imdur.  According to Dr. Royann Shivers, patient historically had chest pain every time he is volume overloaded.  I will obtain basic metabolic panel and BNP.  If BNP is high, will consider extra dose of torsemide for 2 days.  Note, he is currently on 80 mg twice a day of torsemide.   ROS:   Patient complains of dyspnea, fatigue, chest discomfort.  Studies Reviewed: [name]   EKG  Interpretation Date/Time:  Friday December 22 2022 11:21:26 EDT Ventricular Rate:  89 PR Interval:  208 QRS Duration:  184 QT Interval:  460 QTC Calculation: 559 R Axis:   78  Text Interpretation: Normal sinus rhythm Left bundle branch block Confirmed by Azalee Course [phone]) on 12/24/2022 11:30:51 PM    Cardiac Studies & Procedures   CARDIAC CATHETERIZATION  CARDIAC CATHETERIZATION 10/29/2015  Narrative Findings:  RA = 7 RV = 30/9 PA = 29/13 (20) PCW = 8 Fick cardiac output/index = 5.3/2.2 PVR = 2.4 WU Ao sat = 98% PA sat = 59%, 59%  Assessment: 1. Low filling pressures 2. Normal cardiac output  Plan/Discussion:  Filling pressures are low. Cardiac output is preserved. Given high RA/PCWP ratio suspect component of RV failure. Will hold diuratics for now. D/w Dr. Rubie Maid.  Bensimhon, Daniel,MD 5:13 PM   STRESS TESTS  MYOCARDIAL PERFUSION IMAGING 10/10/2019  Narrative  Nuclear stress EF: 49%.  The left ventricular ejection fraction is mildly decreased (45-54%).  Defect 1: There is a small defect of mild severity present in the basal anterolateral, mid anterolateral and apical lateral location.  This is an intermediate risk study.  1. Reversible basal to apical anterolateral perfusion defect consistent with ischemia. 2. Septal hypokinesis, which may be due to paced rhythm   ECHOCARDIOGRAM  ECHOCARDIOGRAM COMPLETE 01/23/2022  Narrative ECHOCARDIOGRAM REPORT    Patient Name:   PHONG MEADS Date of Exam: 01/23/2022 Medical Rec #:  [id number]      Height:       71.5 in Accession #:    [id number]     Weight:       251.2 lb Date of Birth:  [birth year]      BSA:          2.335 m Patient Age:    71 years       BP:           108/58 mmHg Patient Gender: M              HR:           80 bpm. Exam Location:  [address]  Procedure: 2D Echo, Cardiac Doppler, Color Doppler and Strain Analysis  Indications:    Congestive Heart Failure I50.9  History:        Patient has  prior history of Echocardiogram examinations, most recent 03/17/2021. CAD, Pacemaker, COPD, Arrythmias:Atrial Fibrillation; Risk Factors:Hypertension, Diabetes, Tobacco abuse and Dyslipidemia.  Sonographer:    Louie Boston RDCS Referring Phys: [phone] MIHAI CROITORU  IMPRESSIONS   1. Left ventricular ejection fraction, by estimation, is 45 to 50%. The left ventricle has mildly decreased function. The left ventricle demonstrates global hypokinesis. There is severe concentric left ventricular hypertrophy. Left ventricular diastolic function could not be evaluated. 2. Right ventricular systolic function is mildly reduced. The right ventricular size is normal. There is mildly elevated pulmonary artery systolic pressure. 3. Left atrial size was mildly dilated. 4. The mitral valve has been repaired/replaced. Mild mitral valve regurgitation. Possible stenosis of the bioprosthetic mitral valve. The mean mitral  valve gradient is 9.0 mmHg. 5. The aortic valve is tricuspid. There is moderate calcification of the aortic valve. There is mild thickening of the aortic valve. Aortic valve regurgitation is mild to moderate. Moderate aortic valve stenosis. Aortic valve area, by VTI measures 1.34 cm. Aortic valve mean gradient measures 22.0 mmHg. Aortic valve Vmax measures 3.04 m/s. 6. 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 7. The inferior vena cava is normal in size with greater than 50% respiratory variability, suggesting right atrial pressure of 3 mmHg.  FINDINGS Left Ventricle: Left ventricular ejection fraction, by estimation, is 45 to 50%. The left ventricle has mildly decreased function. The left ventricle demonstrates global hypokinesis. The left ventricular internal cavity size was normal in size. There is severe concentric left ventricular hypertrophy. Abnormal (paradoxical) septal motion, consistent with RV pacemaker. Left ventricular diastolic  function could not be evaluated due to mitral valve replacement. Left ventricular diastolic function could not be evaluated.  Right Ventricle: The right ventricular size is normal. No increase in right ventricular wall thickness. Right ventricular systolic function is mildly reduced. There is mildly elevated pulmonary artery systolic pressure. The tricuspid regurgitant velocity is 2.97 m/s, and with an assumed right atrial pressure of 3 mmHg, the estimated right ventricular systolic pressure is 38.3 mmHg.  Left Atrium: Left atrial size was mildly dilated.  Right Atrium: Right atrial size was normal in size.  Pericardium: There is no evidence of pericardial effusion. Presence of epicardial fat layer.  Mitral Valve: The mitral valve has been repaired/replaced. Mild mitral valve regurgitation. There is a bioprosthetic valve present in the mitral position. Possible bioprosthetic stenosis mitral valve stenosis. MV peak gradient, 15.5 mmHg. The mean mitral valve gradient is 9.0 mmHg.  Tricuspid Valve: The tricuspid valve is normal in structure. Tricuspid valve regurgitation is mild . No evidence of tricuspid stenosis.  Aortic Valve: The aortic valve is tricuspid. There is moderate calcification of the aortic valve. There is mild thickening of the aortic valve. There is mild aortic valve annular calcification. Aortic valve regurgitation is mild to moderate. Aortic regurgitation PHT measures 362 msec. Moderate aortic stenosis is present. Aortic valve mean gradient measures 22.0 mmHg. Aortic valve peak gradient measures 36.8 mmHg. Aortic valve area, by VTI measures 1.34 cm.  Pulmonic Valve: The pulmonic valve was normal in structure. Pulmonic valve regurgitation is not visualized. No evidence of pulmonic stenosis.  Aorta: There is an aneurysm involving the ascending aorta measuring 42 mm. There is an aneurysm involving the aortic root measuring 44 mm.  Venous: The inferior vena cava is normal in size  with greater than 50% respiratory variability, suggesting right atrial pressure of 3 mmHg.  IAS/Shunts: No atrial level shunt detected by color flow Doppler.  Additional Comments: A device lead is visualized.   LEFT VENTRICLE PLAX 2D LVIDd:         5.70 cm      Diastology LVIDs:         5.00 cm      LV e' medial:    4.35 cm/s LV PW:         1.50 cm      LV E/e' medial:  36.3 LV IVS:        1.50 cm      LV e' lateral:   8.92 cm/s LVOT diam:     2.00 cm      LV E/e' lateral: 17.7 LV SV:  95 LV SV Index:   41 LVOT Area:     3.14 cm  LV Volumes (MOD) LV vol d, MOD A2C: 124.0 ml LV vol d, MOD A4C: 132.0 ml LV vol s, MOD A2C: 68.4 ml LV vol s, MOD A4C: 69.7 ml LV SV MOD A2C:     55.6 ml LV SV MOD A4C:     132.0 ml LV SV MOD BP:      60.0 ml  RIGHT VENTRICLE            IVC RV S prime:     8.16 cm/s  IVC diam: 1.60 cm TAPSE (M-mode): 1.7 cm  LEFT ATRIUM             Index        RIGHT ATRIUM           Index LA diam:        5.30 cm 2.27 cm/m   RA Area:     18.10 cm LA Vol (A2C):   79.0 ml 33.83 ml/m  RA Volume:   52.20 ml  22.35 ml/m LA Vol (A4C):   94.9 ml 40.64 ml/m LA Biplane Vol: 95.3 ml 40.81 ml/m AORTIC VALVE AV Area (Vmax):    1.22 cm AV Area (Vmean):   1.13 cm AV Area (VTI):     1.34 cm AV Vmax:           303.50 cm/s AV Vmean:          221.000 cm/s AV VTI:            0.710 m AV Peak Grad:      36.8 mmHg AV Mean Grad:      22.0 mmHg LVOT Vmax:         118.00 cm/s LVOT Vmean:        79.800 cm/s LVOT VTI:          0.302 m LVOT/AV VTI ratio: 0.43 AI PHT:            362 msec  AORTA Ao Root diam: 4.40 cm Ao Asc diam:  4.20 cm Ao Desc diam: 2.30 cm  MITRAL VALVE                TRICUSPID VALVE MV Area (PHT): 3.01 cm     TR Peak grad:   35.3 mmHg MV Area VTI:   1.92 cm     TR Vmax:        297.00 cm/s MV Peak grad:  15.5 mmHg MV Mean grad:  9.0 mmHg     SHUNTS MV Vmax:       1.97 m/s     Systemic VTI:  0.30 m MV Vmean:      141.0 cm/s   Systemic  Diam: 2.00 cm MV Decel Time: 252 msec MV E velocity: 158.00 cm/s MV A velocity: 169.00 cm/s MV E/A ratio:  0.93  Kardie Tobb DO Electronically signed by Thomasene Ripple DO Signature Date/Time: 01/23/2022/3:47:32 PM    Final   TEE  ECHO TEE 04/26/2021  Narrative TRANSESOPHOGEAL ECHO REPORT    Patient Name:   SHERMAR WISMAN Date of Exam: 04/26/2021 Medical Rec #:  [id number]      Height:       71.0 in Accession #:    [id number]     Weight:       249.0 lb Date of Birth:  09-14-1950      BSA:          2.314 m Patient  Age:    70 years       BP:           116/56 mmHg Patient Gender: M              HR:           85 bpm. Exam Location:  Inpatient  Procedure: Transesophageal Echo, Cardiac Doppler, Color Doppler and 3D Echo  Indications:     Aortic Insufficiency  History:         Patient has prior history of Echocardiogram examinations, most recent 03/17/2021. CHF, CAD, Arrythmias:Atrial Fibrillation; Risk Factors:Hypertension and Diabetes.  Mitral Valve: 31 mm Edwards bioprosthetic valve valve is present in the mitral position. Procedure Date: 06/12/2013.  Sonographer:     Eulah Pont RDCS Referring Phys:  [id number] Lisette Abu HILTY Diagnosing Phys: Zoila Shutter MD  PROCEDURE: After discussion of the risks and benefits of a TEE, an informed consent was obtained from the patient. The transesophogeal probe was passed without difficulty through the esophogus of the patient. Sedation performed by different physician. The patient was monitored while under deep sedation. Anesthestetic sedation was provided intravenously by Anesthesiology: 358.7mg  of Propofol. The patient's vital signs; including heart rate, blood pressure, and oxygen saturation; remained stable throughout the procedure. The patient developed no complications during the procedure.  IMPRESSIONS   1. Left ventricular ejection fraction, by estimation, is 55 to 60%. The left ventricle has normal function. There is moderate left  ventricular hypertrophy. 2. Right ventricular systolic function is mildly reduced. The right ventricular size is normal. 3. Left atrial size was mildly dilated. No left atrial/left atrial appendage thrombus was detected. 4. 2D/3D visualization. The mitral valve has been repaired/replaced. Trivial mitral valve regurgitation. No evidence of mitral stenosis. The mean mitral valve gradient is 4.0 mmHg with average heart rate of 87 bpm. There is a 31 mm Edwards bioprosthetic valve present in the mitral position. Procedure Date: 06/12/2013. Echo findings are consistent with normal structure and function of the mitral valve prosthesis. 5. The tricuspid valve is abnormal. Tricuspid valve regurgitation is mild to moderate. 6. The aortic valve is tricuspid. There is mild calcification of the aortic valve. There is moderate thickening of the aortic valve. Aortic valve regurgitation is moderate. Mild aortic valve stenosis. Aortic regurgitation PHT measures 359 msec.  Comparison(s): 03/17/2021: LVEF 55-60%, MV gradient 6 mmHg, moderate to severe AI, mild AS.  FINDINGS Left Ventricle: Left ventricular ejection fraction, by estimation, is 55 to 60%. The left ventricle has normal function. The left ventricular internal cavity size was normal in size. There is moderate left ventricular hypertrophy.  Right Ventricle: The right ventricular size is normal. No increase in right ventricular wall thickness. Right ventricular systolic function is mildly reduced.  Left Atrium: Left atrial size was mildly dilated. No left atrial/left atrial appendage thrombus was detected.  Right Atrium: Right atrial size was normal in size.  Pericardium: There is no evidence of pericardial effusion.  Mitral Valve: 2D/3D visualization. The mitral valve has been repaired/replaced. Trivial mitral valve regurgitation. There is a 31 mm Edwards bioprosthetic valve present in the mitral position. Procedure Date: 06/12/2013. Echo findings are  consistent with normal structure and function of the mitral valve prosthesis. No evidence of mitral valve stenosis. The mean mitral valve gradient is 4.0 mmHg with average heart rate of 87 bpm.  Tricuspid Valve: The tricuspid valve is abnormal. Tricuspid valve regurgitation is mild to moderate.  Aortic Valve: The aortic valve is tricuspid. There is mild calcification of  the aortic valve. There is moderate thickening of the aortic valve. There is mild aortic valve annular calcification. Aortic valve regurgitation is moderate. Aortic regurgitation PHT measures 359 msec. Mild aortic stenosis is present.  Pulmonic Valve: The pulmonic valve was grossly normal. Pulmonic valve regurgitation is trivial.  Aorta: The aortic root and ascending aorta are structurally normal, with no evidence of dilitation.  IAS/Shunts: No atrial level shunt detected by color flow Doppler.  Additional Comments: A device lead is visualized.   AORTIC VALVE AI PHT:      359 msec  MITRAL VALVE MV Mean grad: 4.0 mmHg  Zoila Shutter MD Electronically signed by Zoila Shutter MD Signature Date/Time: 04/26/2021/7:47:30 PM    Final            Risk Assessment/Calculations:    CHA2DS2-VASc Score = 4   This indicates a 4.8% annual risk of stroke. The patient's score is based upon: CHF History: 1 HTN History: 1 Diabetes History: 0 Stroke History: 0 Vascular Disease History: 1 Age Score: 1 Gender Score: 0           Physical Exam:   VS:  BP 100/60 (BP Location: Left Arm, Patient Position: Sitting, Cuff Size: Normal)   Pulse 89   Ht 5\' 9"  (1.753 m)   Wt 267 lb 9.6 oz (121.4 kg)   SpO2 94%   BMI 39.52 kg/m    Wt Readings from Last 3 Encounters:  12/22/22 267 lb 9.6 oz (121.4 kg)  11/14/22 264 lb 6.4 oz (119.9 kg)  08/17/22 265 lb 6.4 oz (120.4 kg)    GEN: Well nourished, well developed in no acute distress NECK: No JVD; No carotid bruits CARDIAC: RRR, no murmurs, rubs, gallops RESPIRATORY:  Clear to  auscultation without rales, wheezing or rhonchi  ABDOMEN: Soft, non-tender, non-distended EXTREMITIES:  No edema; No deformity   ASSESSMENT AND PLAN: .    Chest discomfort: Patient has a history of CAD, I previously increase his Imdur to 60 mg daily.  I am unable to further uptitrate antianginal therapy due to borderline blood pressure.  Case discussed with Dr. Royann Shivers, apparently patient also has history of chest discomfort whenever he is volume overloaded.  He was recently admitted to the hospital and now underwent IV diuresis.  He does not appears to be volume overloaded on physical exam, will obtain basic metabolic panel and BNP to see if he is trapping fluid.  If BNP is elevated, may consider 2 days of a higher dose of diuretic.  LBBB: Chronic  Chronic combined systolic and diastolic heart failure: Euvolemic on exam, recently admitted to the hospital for chest discomfort and underwent diuresis.  According to Dr. Royann Shivers, patient historically had chest discomfort every time he is volume overloaded.  Will obtain basic metabolic panel and a BMP, if BNP is elevated, may consider higher dose of diuretic for 2 days.  Note, during the recent hospitalization, his torsemide was increased from the previous 60 mg twice a day to 80 mg twice a day  History of mitral valve replacement: Stable on last echocardiogram.  SBE prophylaxis  History of endocarditis: Occurred in 2023, his previous ICD was explanted due to endocarditis  Ischemic cardiomyopathy: Previously responded to CRT-D device, however CRT-D device was explanted in 2023 due to endocarditis.  He is EF has since been dropping.  The case was discussed with the EP service who felt that his risk of reinfection is prohibitive for consideration of another CRT-D device  COPD: No acute exacerbation  PAF: On amiodarone therapy and Eliquis  CKD stage IV: Followed by nephrology service       Dispo: Follow-up with Dr. Royann Shivers  Signed, Azalee Course,  [place]

## 2022-12-22 NOTE — Patient Instructions (Signed)
Medication Instructions:  Your physician recommends that you continue on your current medications as directed. Please refer to the Current Medication list given to you today.   *If you need a refill on your cardiac medications before your next appointment, please call your pharmacy*   Lab Work: Lab will be drawn today If you have labs (blood work) drawn today and your tests are completely normal, you will receive your results only by: MyChart Message (if you have MyChart) OR A paper copy in the mail If you have any lab test that is abnormal or we need to change your treatment, we will call you to review the results.   Testing/Procedures: none   Follow-Up: At [facility], you and your health needs are our priority.  As part of our continuing mission to provide you with exceptional heart care, we have created designated Provider Care Teams.  These Care Teams include your primary Cardiologist (physician) and Advanced Practice Providers (APPs -  Physician Assistants and Nurse Practitioners) who all work together to provide you with the care you need, when you need it.  We recommend signing up for the patient portal called "MyChart".  Sign up information is provided on this After Visit Summary.  MyChart is used to connect with patients for Virtual Visits (Telemedicine).  Patients are able to view lab/test results, encounter notes, upcoming appointments, etc.  Non-urgent messages can be sent to your provider as well.   To learn more about what you can do with MyChart, go to [email or web address].    Your next appointment:    February 14 2023 a 11:00. Please arrive 15 minutes before scheduled time  Provider:   Thurmon Fair, MD     Other Instructions Pending your lab result torsemide may be increased. Await further instructions

## 2022-12-23 LAB — BASIC METABOLIC PANEL
BUN/Creatinine Ratio: 23 (ref 10–24)
BUN: 93 mg/dL (ref 8–27)
CO2: 21 mmol/L (ref 20–29)
Calcium: 8.5 mg/dL — ABNORMAL LOW (ref 8.6–10.2)
Chloride: 91 mmol/L — ABNORMAL LOW (ref 96–106)
Creatinine, Ser: 4.08 mg/dL — ABNORMAL HIGH (ref 0.76–1.27)
Glucose: 147 mg/dL — ABNORMAL HIGH (ref 70–99)
Potassium: 3.6 mmol/L (ref 3.5–5.2)
Sodium: 133 mmol/L — ABNORMAL LOW (ref 134–144)
eGFR: 15 mL/min/{1.73_m2} — ABNORMAL LOW (ref >59)

## 2022-12-23 LAB — BRAIN NATRIURETIC PEPTIDE: BNP: 624.8 pg/mL — ABNORMAL HIGH (ref 0.0–100.0)

## 2022-12-25 ENCOUNTER — Other Ambulatory Visit: Payer: Self-pay

## 2022-12-25 DIAGNOSIS — I5042 Chronic combined systolic (congestive) and diastolic (congestive) heart failure: Secondary | ICD-10-CM

## 2023-01-22 ENCOUNTER — Telehealth: Payer: Self-pay | Admitting: Cardiovascular Disease

## 2023-01-22 NOTE — Telephone Encounter (Signed)
Dominic [name] with [facility] called in to report weight gain. Last week patient weighed 246 lbs and today he weighs 252 lbs. No edema to report, per Dominic [name], but she states that patient mentioned having a headache today.

## 2023-01-22 NOTE — Telephone Encounter (Signed)
Tried calling Dominic [name] with [facility] back and neither number listed is hers. They both belong to the pt's wife.

## 2023-01-23 NOTE — Telephone Encounter (Signed)
Best addressed when he goes to dialysis.

## 2023-01-23 NOTE — Telephone Encounter (Signed)
Call to patient as Home Health called due to weight gain.  Spoke with patient and this is not a new issue. Patient currently on dialysis Tues, Thur, Sat.  Patient has no edema to lower extremities , just stomach area. No increased SOB.  He is on O2 at [employer], since  mid July Weight today  253.4.  States it generally pulls off with dialysis.  No changes or concerns.

## 2023-01-23 NOTE — Telephone Encounter (Signed)
Patient aware.

## 2023-02-12 ENCOUNTER — Inpatient Hospital Stay (HOSPITAL_COMMUNITY): Payer: No Typology Code available for payment source

## 2023-02-12 ENCOUNTER — Other Ambulatory Visit: Payer: Self-pay

## 2023-02-12 ENCOUNTER — Encounter (HOSPITAL_COMMUNITY): Payer: Self-pay | Admitting: Student in an Organized Health Care Education/Training Program

## 2023-02-12 ENCOUNTER — Inpatient Hospital Stay (HOSPITAL_COMMUNITY)
Admit: 2023-02-12 | Discharge: 2023-02-17 | DRG: 280 | Disposition: A | Discharge status: Home / Self Care | Payer: No Typology Code available for payment source | Source: Other Acute Inpatient Hospital | Attending: Cardiology | Admitting: Cardiology

## 2023-02-12 ENCOUNTER — Encounter (HOSPITAL_COMMUNITY): Payer: Self-pay

## 2023-02-12 DIAGNOSIS — Z79899 Other long term (current) drug therapy: Secondary | ICD-10-CM

## 2023-02-12 DIAGNOSIS — E8779 Other fluid overload: Secondary | ICD-10-CM | POA: Diagnosis not present

## 2023-02-12 DIAGNOSIS — I050 Rheumatic mitral stenosis: Secondary | ICD-10-CM | POA: Insufficient documentation

## 2023-02-12 DIAGNOSIS — Y831 Surgical operation with implant of artificial internal device as the cause of abnormal reaction of the patient, or of later complication, without mention of misadventure at the time of the procedure: Secondary | ICD-10-CM | POA: Diagnosis present

## 2023-02-12 DIAGNOSIS — I351 Nonrheumatic aortic (valve) insufficiency: Secondary | ICD-10-CM | POA: Insufficient documentation

## 2023-02-12 DIAGNOSIS — E1140 Type 2 diabetes mellitus with diabetic neuropathy, unspecified: Secondary | ICD-10-CM | POA: Diagnosis present

## 2023-02-12 DIAGNOSIS — I10 Essential (primary) hypertension: Secondary | ICD-10-CM | POA: Diagnosis present

## 2023-02-12 DIAGNOSIS — E8770 Fluid overload, unspecified: Secondary | ICD-10-CM | POA: Insufficient documentation

## 2023-02-12 DIAGNOSIS — Z8249 Family history of ischemic heart disease and other diseases of the circulatory system: Secondary | ICD-10-CM

## 2023-02-12 DIAGNOSIS — I7121 Aneurysm of the ascending aorta, without rupture: Secondary | ICD-10-CM | POA: Diagnosis present

## 2023-02-12 DIAGNOSIS — R001 Bradycardia, unspecified: Secondary | ICD-10-CM | POA: Diagnosis present

## 2023-02-12 DIAGNOSIS — R55 Syncope and collapse: Secondary | ICD-10-CM | POA: Diagnosis present

## 2023-02-12 DIAGNOSIS — I5043 Acute on chronic combined systolic (congestive) and diastolic (congestive) heart failure: Secondary | ICD-10-CM | POA: Diagnosis present

## 2023-02-12 DIAGNOSIS — F17210 Nicotine dependence, cigarettes, uncomplicated: Secondary | ICD-10-CM | POA: Diagnosis present

## 2023-02-12 DIAGNOSIS — I342 Nonrheumatic mitral (valve) stenosis: Secondary | ICD-10-CM | POA: Diagnosis not present

## 2023-02-12 DIAGNOSIS — Z23 Encounter for immunization: Secondary | ICD-10-CM

## 2023-02-12 DIAGNOSIS — Z7901 Long term (current) use of anticoagulants: Secondary | ICD-10-CM

## 2023-02-12 DIAGNOSIS — E876 Hypokalemia: Secondary | ICD-10-CM | POA: Diagnosis present

## 2023-02-12 DIAGNOSIS — I2721 Secondary pulmonary arterial hypertension: Secondary | ICD-10-CM | POA: Diagnosis present

## 2023-02-12 DIAGNOSIS — Y832 Surgical operation with anastomosis, bypass or graft as the cause of abnormal reaction of the patient, or of later complication, without mention of misadventure at the time of the procedure: Secondary | ICD-10-CM | POA: Diagnosis present

## 2023-02-12 DIAGNOSIS — E669 Obesity, unspecified: Secondary | ICD-10-CM | POA: Diagnosis present

## 2023-02-12 DIAGNOSIS — I255 Ischemic cardiomyopathy: Secondary | ICD-10-CM | POA: Diagnosis present

## 2023-02-12 DIAGNOSIS — Z716 Tobacco abuse counseling: Secondary | ICD-10-CM

## 2023-02-12 DIAGNOSIS — Z954 Presence of other heart-valve replacement: Secondary | ICD-10-CM

## 2023-02-12 DIAGNOSIS — I440 Atrioventricular block, first degree: Secondary | ICD-10-CM | POA: Diagnosis present

## 2023-02-12 DIAGNOSIS — I214 Non-ST elevation (NSTEMI) myocardial infarction: Secondary | ICD-10-CM | POA: Insufficient documentation

## 2023-02-12 DIAGNOSIS — F32A Depression, unspecified: Secondary | ICD-10-CM | POA: Diagnosis present

## 2023-02-12 DIAGNOSIS — I5021 Acute systolic (congestive) heart failure: Secondary | ICD-10-CM | POA: Insufficient documentation

## 2023-02-12 DIAGNOSIS — J449 Chronic obstructive pulmonary disease, unspecified: Secondary | ICD-10-CM | POA: Diagnosis present

## 2023-02-12 DIAGNOSIS — Z794 Long term (current) use of insulin: Secondary | ICD-10-CM | POA: Diagnosis not present

## 2023-02-12 DIAGNOSIS — M898X9 Other specified disorders of bone, unspecified site: Secondary | ICD-10-CM | POA: Diagnosis present

## 2023-02-12 DIAGNOSIS — I2582 Chronic total occlusion of coronary artery: Secondary | ICD-10-CM | POA: Diagnosis present

## 2023-02-12 DIAGNOSIS — Z952 Presence of prosthetic heart valve: Secondary | ICD-10-CM | POA: Diagnosis not present

## 2023-02-12 DIAGNOSIS — Z6835 Body mass index (BMI) 35.0-35.9, adult: Secondary | ICD-10-CM

## 2023-02-12 DIAGNOSIS — Z9981 Dependence on supplemental oxygen: Secondary | ICD-10-CM

## 2023-02-12 DIAGNOSIS — D631 Anemia in chronic kidney disease: Secondary | ICD-10-CM | POA: Diagnosis present

## 2023-02-12 DIAGNOSIS — E782 Mixed hyperlipidemia: Secondary | ICD-10-CM | POA: Diagnosis not present

## 2023-02-12 DIAGNOSIS — I340 Nonrheumatic mitral (valve) insufficiency: Secondary | ICD-10-CM | POA: Diagnosis not present

## 2023-02-12 DIAGNOSIS — E785 Hyperlipidemia, unspecified: Secondary | ICD-10-CM | POA: Diagnosis present

## 2023-02-12 DIAGNOSIS — I2510 Atherosclerotic heart disease of native coronary artery without angina pectoris: Secondary | ICD-10-CM | POA: Diagnosis present

## 2023-02-12 DIAGNOSIS — Z7951 Long term (current) use of inhaled steroids: Secondary | ICD-10-CM

## 2023-02-12 DIAGNOSIS — Z992 Dependence on renal dialysis: Secondary | ICD-10-CM

## 2023-02-12 DIAGNOSIS — E119 Type 2 diabetes mellitus without complications: Secondary | ICD-10-CM

## 2023-02-12 DIAGNOSIS — Z66 Do not resuscitate: Secondary | ICD-10-CM | POA: Diagnosis present

## 2023-02-12 DIAGNOSIS — I5023 Acute on chronic systolic (congestive) heart failure: Secondary | ICD-10-CM | POA: Diagnosis not present

## 2023-02-12 DIAGNOSIS — I21A1 Myocardial infarction type 2: Principal | ICD-10-CM | POA: Diagnosis present

## 2023-02-12 DIAGNOSIS — I480 Paroxysmal atrial fibrillation: Secondary | ICD-10-CM | POA: Diagnosis not present

## 2023-02-12 DIAGNOSIS — I447 Left bundle-branch block, unspecified: Secondary | ICD-10-CM | POA: Diagnosis present

## 2023-02-12 DIAGNOSIS — N186 End stage renal disease: Secondary | ICD-10-CM | POA: Diagnosis present

## 2023-02-12 DIAGNOSIS — Z880 Allergy status to penicillin: Secondary | ICD-10-CM

## 2023-02-12 DIAGNOSIS — T82818A Embolism of vascular prosthetic devices, implants and grafts, initial encounter: Secondary | ICD-10-CM | POA: Diagnosis present

## 2023-02-12 DIAGNOSIS — E1122 Type 2 diabetes mellitus with diabetic chronic kidney disease: Secondary | ICD-10-CM | POA: Diagnosis present

## 2023-02-12 DIAGNOSIS — I132 Hypertensive heart and chronic kidney disease with heart failure and with stage 5 chronic kidney disease, or end stage renal disease: Secondary | ICD-10-CM | POA: Diagnosis present

## 2023-02-12 DIAGNOSIS — Z7189 Other specified counseling: Secondary | ICD-10-CM | POA: Diagnosis not present

## 2023-02-12 DIAGNOSIS — I25118 Atherosclerotic heart disease of native coronary artery with other forms of angina pectoris: Secondary | ICD-10-CM | POA: Diagnosis not present

## 2023-02-12 DIAGNOSIS — J9621 Acute and chronic respiratory failure with hypoxia: Secondary | ICD-10-CM | POA: Diagnosis present

## 2023-02-12 DIAGNOSIS — I350 Nonrheumatic aortic (valve) stenosis: Secondary | ICD-10-CM

## 2023-02-12 DIAGNOSIS — Z5986 Financial insecurity: Secondary | ICD-10-CM

## 2023-02-12 DIAGNOSIS — Z881 Allergy status to other antibiotic agents status: Secondary | ICD-10-CM

## 2023-02-12 DIAGNOSIS — Z951 Presence of aortocoronary bypass graft: Secondary | ICD-10-CM

## 2023-02-12 DIAGNOSIS — Z955 Presence of coronary angioplasty implant and graft: Secondary | ICD-10-CM

## 2023-02-12 DIAGNOSIS — Z7984 Long term (current) use of oral hypoglycemic drugs: Secondary | ICD-10-CM

## 2023-02-12 DIAGNOSIS — T82857A Stenosis of cardiac prosthetic devices, implants and grafts, initial encounter: Secondary | ICD-10-CM | POA: Diagnosis present

## 2023-02-12 DIAGNOSIS — Z515 Encounter for palliative care: Secondary | ICD-10-CM | POA: Diagnosis not present

## 2023-02-12 DIAGNOSIS — I083 Combined rheumatic disorders of mitral, aortic and tricuspid valves: Secondary | ICD-10-CM | POA: Diagnosis present

## 2023-02-12 DIAGNOSIS — I77810 Thoracic aortic ectasia: Secondary | ICD-10-CM | POA: Diagnosis present

## 2023-02-12 DIAGNOSIS — Z888 Allergy status to other drugs, medicaments and biological substances status: Secondary | ICD-10-CM

## 2023-02-12 MED ORDER — ACETAMINOPHEN 325 MG PO TABS
650.0000 mg | ORAL_TABLET | ORAL | Status: DC | PRN
  Administered 2023-02-13 – 2023-02-15 (×2): 650 mg via ORAL
  Filled 2023-02-12 (×3): qty 2

## 2023-02-12 MED ORDER — ONDANSETRON HCL 4 MG/2ML IJ SOLN
4.0000 mg | Freq: Four times a day (QID) | INTRAMUSCULAR | Status: DC | PRN
  Administered 2023-02-13: 4 mg via INTRAVENOUS
  Filled 2023-02-12 (×2): qty 2

## 2023-02-12 MED ORDER — SODIUM CHLORIDE 0.9% FLUSH: 3.0000 mL | INTRAVENOUS | Status: DC | PRN

## 2023-02-12 MED ORDER — SODIUM CHLORIDE 0.9% FLUSH
3.0000 mL | Freq: Two times a day (BID) | INTRAVENOUS | Status: DC
  Administered 2023-02-13 – 2023-02-17 (×8): 3 mL via INTRAVENOUS

## 2023-02-12 MED ORDER — SODIUM CHLORIDE 0.9 % IV SOLN: 250.0000 mL | INTRAVENOUS | Status: DC | PRN

## 2023-02-12 NOTE — H&P (Incomplete)
Cardiology Admission History and Physical   Patient ID: Dominic [name] MRN: [id number]; DOB: [birth year]   Admission date: 02/12/2023  PCP:  Montez Hageman, DO   [facility] Providers Cardiologist:  Thurmon Fair, MD   { Click here to update MD or APP on Care Team, Refresh:1}     Chief Complaint:  Syncope  Patient Profile:   Dominic [name] is a 72 y.o. 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  History of Present Illness:   Dominic [name] ***   Past Medical History:  Diagnosis Date   Biventricular ICD (implantable cardioverter-defibrillator) - [facility] 09/05/2015   Jul 01, 2015 [facility] Dynagen X4   CAD (coronary artery disease) 09/05/2015   CABG 2015 PCI-BMS ostial LCX Sept 2015 04/05/2015 cath 50% distal LM, 90% prox LAD, old stent prox LAD 70% ISR, patent LIMA-LAD, 80% prox RCA PCI-DES x2 RCA and LCX    Chronic systolic CHF (congestive heart failure) (HCC) 09/05/2015   a. RHC 10/29/15: PCW 8, CI 2.2. Overall low filling presssures. Mean PA 20; b. echo 10/30/15: EF 45-50%, not tech suff to allwo for LV diastolic fxn, mild AI, nl appearing MVR   CKD stage 4 due to type 2 diabetes mellitus (HCC) 09/05/2015   Diabetes mellitus, type 2 (HCC) 09/05/2015   Essential hypertension 09/05/2015   History of mitral valve replacement with bioprosthetic valve 09/05/2015   31 mm Edwards 2015, Dr. Arvilla Market   Postoperative atrial fibrillation (HCC) 09/05/2015    Past Surgical History:  Procedure Laterality Date   CARDIAC CATHETERIZATION N/A 10/29/2015   Procedure: Right Heart Cath;  Surgeon: Dolores Patty, MD;  Location: [facility] INVASIVE CV LAB;  Service: Cardiovascular;  Laterality: N/A;   CARDIAC VALVE REPLACEMENT  2015   31mm Edwards bioprosthesis    CORONARY ANGIOPLASTY     CORONARY ARTERY BYPASS GRAFT  2015   [facility], Dr. Arvilla Market.   TEE WITHOUT CARDIOVERSION N/A 04/26/2021   Procedure: TRANSESOPHAGEAL ECHOCARDIOGRAM (TEE);  Surgeon: Chrystie Nose, MD;  Location: [facility] ENDOSCOPY;  Service: Cardiovascular;  Laterality: N/A;     Medications Prior to Admission: Prior to Admission medications   Medication Sig Start Date End Date Taking? Authorizing Provider  acetaminophen (TYLENOL) 500 MG tablet Take 500 mg by mouth every 6 (six) hours as needed for moderate pain. 10/26/20   [provider]  albuterol (VENTOLIN HFA) 108 (90 Base) MCG/ACT inhaler Inhale 1-2 puffs into the lungs every 4 (four) hours as needed. 11/23/22   [provider]  allopurinol (ZYLOPRIM) 100 MG tablet Take 100 mg by mouth daily. 11/27/22   [provider]  amiodarone (PACERONE) 200 MG tablet Take 1 tablet (200 mg total) by mouth daily. 08/17/22   Croitoru, Mihai, MD  apixaban (ELIQUIS) 5 MG TABS tablet Take 5 mg by mouth in the morning and at bedtime. 12/23/21   [provider]  atorvastatin (LIPITOR) 40 MG tablet Take 40 mg by mouth daily.    [provider]  B Complex-C (SUPER B COMPLEX PO) Take 1 capsule by mouth daily.    [provider]  Cholecalciferol (VITAMIN D) 50 MCG (2000 UT) tablet Take 2,000 Units by mouth daily.    [provider]  docusate sodium (COLACE) 100 MG capsule Take 100 mg by mouth 2 (two) times daily.    [provider]  DULoxetine (CYMBALTA) 60 MG capsule Take 60 mg by mouth daily.     [provider]  famotidine (PEPCID) 20 MG tablet Take 10 mg by mouth daily.    [provider]  ferrous sulfate 325 (65 FE) MG tablet Take 325 mg by mouth daily with breakfast.    [provider]  fluticasone (FLONASE) 50 MCG/ACT nasal spray Place 1 spray into both nostrils daily as needed for allergies.    [provider]  fluticasone-salmeterol (ADVAIR HFA) 45-21  MCG/ACT inhaler Inhale 1 puff into the lungs 2 (two) times daily. 09/18/19   [provider]  fluticasone-salmeterol (ADVAIR HFA) 45-21 MCG/ACT inhaler Inhale 2 puffs into the lungs 2 (two) times daily. 04/25/22 04/25/23  [provider]  gabapentin (NEURONTIN) 100 MG capsule Take 100 mg by mouth 2 (two) times daily. 03/15/22   [provider]  glipiZIDE (GLUCOTROL XL) 10 MG 24 hr tablet Take 2.5 mg by mouth daily. 10/12/21   [provider]  glucose blood test strip 1 each by Other route as needed for other. Use as instructed    [provider]  hydrALAZINE (APRESOLINE) 10 MG tablet Take 1 tablet (10 mg total) by mouth 3 (three) times daily. 12/18/22   Croitoru, Mihai, MD  HYDROcodone-acetaminophen (NORCO/VICODIN) 5-325 MG tablet Take 1 tablet by mouth every 6 (six) hours as needed for moderate pain. 09/12/19   [provider]  hydrocortisone cream 1 % Apply 1 application  topically daily as needed for itching.    [provider]  INCRUSE ELLIPTA 62.5 MCG/ACT AEPB Inhale 1 puff into the lungs daily.    [provider]  Insulin Glargine w/ Trans Port 100 UNIT/ML SOPN  01/30/22   [provider]  Insulin Pen Needle (PEN NEEDLES 3/16") 31G X 5 MM MISC Use with insulin pen daily 03/03/21   [provider]  isosorbide mononitrate (IMDUR) 60 MG 24 hr tablet Take 1 tablet (60 mg total) by mouth daily. 11/14/22 02/12/23  Azalee Course, PA  LANTUS SOLOSTAR 100 UNIT/ML Solostar Pen Inject 30-40 Units into the skin 2 (two) times daily. Take 40 units in the morning and 30 units in the evening 03/22/21   [provider]  metoprolol succinate (TOPROL XL) 25 MG 24 hr tablet Take 1.5 tablets (37.5 mg total) by mouth daily. 02/08/22   Croitoru, Mihai, MD  nitroGLYCERIN (NITROSTAT) 0.4 MG SL tablet Place 1 tablet (0.4 mg total) under the tongue every 5 (five) minutes as needed for chest pain. 06/19/22   Croitoru, Mihai, MD  polyethylene  glycol powder (GLYCOLAX/MIRALAX) 17 GM/SCOOP powder Take 1 Container by mouth daily. 03/24/20   [provider]  potassium chloride SA (KLOR-CON M) 20 MEQ tablet Take 40 mEq by mouth 2 (two) times daily. Patient not taking: Reported on 12/22/2022 07/27/22 07/27/23  [provider]  potassium chloride SA (KLOR-CON) 20 MEQ tablet TAKE 2 TABLETS BY MOUTH THREE TIMES A DAY 12/08/20   Croitoru, Rachelle Hora, MD  Spacer/Aero-Holding Chambers (EASIVENT) inhaler by Other route. 08/10/21   [provider]  sucralfate (CARAFATE) 1 g tablet Take 1 g by mouth 2 (two) times daily. Patient not taking: Reported on 12/22/2022    [provider]  sucralfate (CARAFATE) 1 g tablet Take 1 g by mouth 3 (three) times daily. 02/17/21   [provider]  TECHLITE PEN NEEDLES  31G X 5 MM MISC USE WITH INSULIN PEN DAILY 03/03/21   [provider]  tiZANidine (ZANAFLEX) 2 MG tablet Take 2 mg by mouth every 8 (eight) hours as needed for muscle spasms. 08/13/19   [provider]  torsemide (DEMADEX) 20 MG tablet Take 3 tablets (60 mg total) by mouth in the morning and at bedtime. 07/03/22   Croitoru, Rachelle Hora, MD     Allergies:    Allergies  Allergen Reactions   Hydrochlorothiazide Itching and Other (See Comments)    Unknown. Hyponatremia   Amoxicillin-Pot Clavulanate Nausea And Vomiting and Other (See Comments)    unknown   Clindamycin Nausea And Vomiting, Nausea Only and Other (See Comments)    Chest pain   Lisinopril Other (See Comments) and Cough    hyponatremia    Meloxicam Itching and Nausea Only   Topiramate Other (See Comments)   Tamsulosin Other (See Comments)    dysuria     Social History:   Social History   Socioeconomic History   Marital status: Married    Spouse name: Not on file   Number of children: Not on file   Years of education: Not on file   Highest education level: Not on file  Occupational History   Not on file  Tobacco Use   Smoking status:  Some Days    Current packs/day: 0.50    Average packs/day: 0.5 packs/day for 40.0 years (20.0 ttl pk-yrs)    Types: Cigarettes   Smokeless tobacco: Never   Tobacco comments:    01/02/2022- Patient smokes about 1/2 half pack daily    06/19/2022- Patient smokes about 10 cigarettes a day  Substance and Sexual Activity   Alcohol use: No    Alcohol/week: 0.0 standard drinks of alcohol   Drug use: No   Sexual activity: Not Currently  Other Topics Concern   Not on file  Social History Narrative   Not on file   Social Determinants of Health   Financial Resource Strain: Medium Risk (02/11/2023)   Received from [facility]   Overall Financial Resource Strain (CARDIA)    Difficulty of Paying Living Expenses: Somewhat hard  Food Insecurity: No Food Insecurity (02/11/2023)   Received from [facility]   Hunger Vital Sign    Worried About Running Out of Food in the Last Year: Never true    Ran Out of Food in the Last Year: Never true  Transportation Needs: No Transportation Needs (02/11/2023)   Received from [facility]   PRAPARE - Transportation    Lack of Transportation (Medical): No    Lack of Transportation (Non-Medical): No  Physical Activity: Inactive (02/11/2023)   Received from [facility]   Exercise Vital Sign    Days of Exercise per Week: 0 days    Minutes of Exercise per Session: 0 min  Stress: No Stress Concern Present (02/11/2023)   Received from [facility] of Occupational Health - Occupational Stress Questionnaire    Feeling of Stress : Only a little  Social Connections: Moderately Isolated (02/11/2023)   Received from [facility]   Social Connection and Isolation Panel [NHANES]    Frequency of Communication with Friends and Family: Once a week    Frequency of Social Gatherings with Friends and Family: Once a week    Attends Religious Services: More than 4 times per year    Active Member of [employer] or Organizations: No    Attends [employer]  or  Organization Meetings: Never    Marital Status: Married  Catering manager Violence: Not At Risk (02/11/2023)   Received from [facility]   Humiliation, Afraid, Rape, and Kick questionnaire    Fear of Current or Ex-Partner: No    Emotionally Abused: No    Physically Abused: No    Sexually Abused: No    Family History:  *** The patient's family history includes Hyperlipidemia in his father; Hypertension in his father.    ROS:  Please see the history of present illness.  ***All other ROS reviewed and negative.     Physical Exam/Data:   Vitals:   02/12/23 2219  BP: 135/78  Pulse: 96  Temp: 97.7 F (36.5 C)  TempSrc: Oral  SpO2: 94%   No intake or output data in the 24 hours ending 02/12/23 2228    12/22/2022   11:13 AM 11/14/2022   10:10 AM 08/17/2022    9:10 AM  Last 3 Weights  Weight (lbs) 267 lb 9.6 oz 264 lb 6.4 oz 265 lb 6.4 oz  Weight (kg) 121.383 kg 119.931 kg 120.385 kg     There is no height or weight on file to calculate BMI.  General:  Well nourished, well developed, in no acute distress*** HEENT: normal Neck: no*** JVD Vascular: No carotid bruits; Distal pulses 2+ bilaterally   Cardiac:  normal S1, S2; RRR; no murmur *** Lungs:  clear to auscultation bilaterally, no wheezing, rhonchi or rales  Abd: soft, nontender, no hepatomegaly  Ext: no*** edema Musculoskeletal:  No deformities, BUE and BLE strength normal and equal Skin: warm and dry  Neuro:  CNs 2-12 intact, no focal abnormalities noted Psych:  Normal affect    EKG:  The ECG that was done *** was personally reviewed and demonstrates ***  Relevant CV Studies: ***  Laboratory Data:  High Sensitivity Troponin:  No results for input(s): "TROPONINIHS" in the last 720 hours.    ChemistryNo results for input(s): "NA", "K", "CL", "CO2", "GLUCOSE", "BUN", "CREATININE", "CALCIUM", "MG", "GFRNONAA", "GFRAA", "ANIONGAP" in the last 168 hours.  No results for input(s): "PROT", "ALBUMIN", "AST", "ALT",  "ALKPHOS", "BILITOT" in the last 168 hours. Lipids No results for input(s): "CHOL", "TRIG", "HDL", "LABVLDL", "LDLCALC", "CHOLHDL" in the last 168 hours. HematologyNo results for input(s): "WBC", "RBC", "HGB", "HCT", "MCV", "MCH", "MCHC", "RDW", "PLT" in the last 168 hours. Thyroid No results for input(s): "TSH", "FREET4" in the last 168 hours. BNPNo results for input(s): "BNP", "PROBNP" in the last 168 hours.  DDimer No results for input(s): "DDIMER" in the last 168 hours.   Radiology/Studies:  No results found.   Assessment and Plan:   ***   Risk Assessment/Risk Scores:  {Complete the following score calculators/questions to meet required metrics.  Press F2:1}  {Is the patient being seen for unstable angina, ACS, NSTEMI or STEMI?:[phone]} {Does this patient have CHF or CHF symptoms?      :[phone]} {Does this patient have ATRIAL FIBRILLATION?[phone]}  Code Status: {Please document patient code status       :29822}  Severity of Illness: {Observation/Inpatient:21159}   For questions or updates, please contact [facility] Please consult [email or web address] for contact info under     Signed, Karl Ito, MD  02/12/2023 10:28 PM

## 2023-02-12 NOTE — Progress Notes (Signed)
Patient arrived from [facility] via EMS transport.

## 2023-02-13 ENCOUNTER — Inpatient Hospital Stay (HOSPITAL_COMMUNITY): Payer: No Typology Code available for payment source

## 2023-02-13 DIAGNOSIS — I447 Left bundle-branch block, unspecified: Secondary | ICD-10-CM | POA: Diagnosis not present

## 2023-02-13 DIAGNOSIS — I5021 Acute systolic (congestive) heart failure: Secondary | ICD-10-CM | POA: Diagnosis not present

## 2023-02-13 DIAGNOSIS — E8770 Fluid overload, unspecified: Secondary | ICD-10-CM | POA: Insufficient documentation

## 2023-02-13 DIAGNOSIS — E782 Mixed hyperlipidemia: Secondary | ICD-10-CM

## 2023-02-13 DIAGNOSIS — R55 Syncope and collapse: Secondary | ICD-10-CM | POA: Diagnosis not present

## 2023-02-13 DIAGNOSIS — I480 Paroxysmal atrial fibrillation: Secondary | ICD-10-CM

## 2023-02-13 DIAGNOSIS — I5043 Acute on chronic combined systolic (congestive) and diastolic (congestive) heart failure: Secondary | ICD-10-CM | POA: Insufficient documentation

## 2023-02-13 DIAGNOSIS — N186 End stage renal disease: Secondary | ICD-10-CM | POA: Diagnosis not present

## 2023-02-13 DIAGNOSIS — I214 Non-ST elevation (NSTEMI) myocardial infarction: Secondary | ICD-10-CM | POA: Insufficient documentation

## 2023-02-13 DIAGNOSIS — R001 Bradycardia, unspecified: Secondary | ICD-10-CM

## 2023-02-13 DIAGNOSIS — E8779 Other fluid overload: Secondary | ICD-10-CM

## 2023-02-13 LAB — RETICULOCYTES
Immature Retic Fract: 29.2 % — ABNORMAL HIGH (ref 2.3–15.9)
RBC.: 3.63 MIL/uL — ABNORMAL LOW (ref 4.22–5.81)
Retic Count, Absolute: 76.2 10*3/uL (ref 19.0–186.0)
Retic Ct Pct: 2.1 % (ref 0.4–3.1)

## 2023-02-13 LAB — ECHOCARDIOGRAM COMPLETE
AR max vel: 1.27 cm2
AV Area VTI: 1.26 cm2
AV Area mean vel: 1.23 cm2
AV Mean grad: 23.3 mmHg
AV Peak grad: 41.1 mmHg
Ao pk vel: 3.21 m/s
Calc EF: 37.4 %
Height: 70 in
P 1/2 time: 319 ms
S' Lateral: 4.8 cm
Single Plane A2C EF: 38.7 %
Single Plane A4C EF: 37.4 %
Weight: 4140.8 [oz_av]

## 2023-02-13 LAB — CBC
HCT: 34.2 % — ABNORMAL LOW (ref 39.0–52.0)
Hemoglobin: 10.9 g/dL — ABNORMAL LOW (ref 13.0–17.0)
MCH: 28.5 pg (ref 26.0–34.0)
MCHC: 31.9 g/dL (ref 30.0–36.0)
MCV: 89.3 fL (ref 80.0–100.0)
Platelets: 176 10*3/uL (ref 150–400)
RBC: 3.83 MIL/uL — ABNORMAL LOW (ref 4.22–5.81)
RDW: 18.3 % — ABNORMAL HIGH (ref 11.5–15.5)
WBC: 6.8 10*3/uL (ref 4.0–10.5)
nRBC: 0 % (ref 0.0–0.2)

## 2023-02-13 LAB — CBC WITH DIFFERENTIAL/PLATELET
Abs Immature Granulocytes: 0.09 10*3/uL — ABNORMAL HIGH (ref 0.00–0.07)
Basophils Absolute: 0 10*3/uL (ref 0.0–0.1)
Basophils Relative: 0 %
Eosinophils Absolute: 0.1 10*3/uL (ref 0.0–0.5)
Eosinophils Relative: 1 %
HCT: 33.2 % — ABNORMAL LOW (ref 39.0–52.0)
Hemoglobin: 10.6 g/dL — ABNORMAL LOW (ref 13.0–17.0)
Immature Granulocytes: 1 %
Lymphocytes Relative: 14 %
Lymphs Abs: 1 10*3/uL (ref 0.7–4.0)
MCH: 29.4 pg (ref 26.0–34.0)
MCHC: 31.9 g/dL (ref 30.0–36.0)
MCV: 92 fL (ref 80.0–100.0)
Monocytes Absolute: 0.6 10*3/uL (ref 0.1–1.0)
Monocytes Relative: 8 %
Neutro Abs: 5.2 10*3/uL (ref 1.7–7.7)
Neutrophils Relative %: 76 %
Platelets: 179 10*3/uL (ref 150–400)
RBC: 3.61 MIL/uL — ABNORMAL LOW (ref 4.22–5.81)
RDW: 18.5 % — ABNORMAL HIGH (ref 11.5–15.5)
WBC: 7 10*3/uL (ref 4.0–10.5)
nRBC: 0 % (ref 0.0–0.2)

## 2023-02-13 LAB — COMPREHENSIVE METABOLIC PANEL
ALT: 26 U/L (ref 0–44)
AST: 33 U/L (ref 15–41)
Albumin: 2.2 g/dL — ABNORMAL LOW (ref 3.5–5.0)
Alkaline Phosphatase: 87 U/L (ref 38–126)
Anion gap: 9 (ref 5–15)
BUN: 50 mg/dL — ABNORMAL HIGH (ref 8–23)
CO2: 20 mmol/L — ABNORMAL LOW (ref 22–32)
Calcium: 8.3 mg/dL — ABNORMAL LOW (ref 8.9–10.3)
Chloride: 105 mmol/L (ref 98–111)
Creatinine, Ser: 3.26 mg/dL — ABNORMAL HIGH (ref 0.61–1.24)
GFR, Estimated: 19 mL/min — ABNORMAL LOW (ref >60)
Glucose, Bld: 84 mg/dL (ref 70–99)
Potassium: 4.1 mmol/L (ref 3.5–5.1)
Sodium: 134 mmol/L — ABNORMAL LOW (ref 135–145)
Total Bilirubin: 0.6 mg/dL (ref 0.3–1.2)
Total Protein: 6.3 g/dL — ABNORMAL LOW (ref 6.5–8.1)

## 2023-02-13 LAB — BASIC METABOLIC PANEL
Anion gap: 13 (ref 5–15)
BUN: 50 mg/dL — ABNORMAL HIGH (ref 8–23)
CO2: 20 mmol/L — ABNORMAL LOW (ref 22–32)
Calcium: 8.5 mg/dL — ABNORMAL LOW (ref 8.9–10.3)
Chloride: 103 mmol/L (ref 98–111)
Creatinine, Ser: 3.03 mg/dL — ABNORMAL HIGH (ref 0.61–1.24)
GFR, Estimated: 21 mL/min — ABNORMAL LOW (ref >60)
Glucose, Bld: 134 mg/dL — ABNORMAL HIGH (ref 70–99)
Potassium: 4 mmol/L (ref 3.5–5.1)
Sodium: 136 mmol/L (ref 135–145)

## 2023-02-13 LAB — IRON AND TIBC
Iron: 54 ug/dL (ref 45–182)
Saturation Ratios: 27 % (ref 17.9–39.5)
TIBC: 202 ug/dL — ABNORMAL LOW (ref 250–450)
UIBC: 148 ug/dL

## 2023-02-13 LAB — LIPID PANEL
Cholesterol: 128 mg/dL (ref 0–200)
HDL: 46 mg/dL (ref >40)
LDL Cholesterol: 61 mg/dL (ref 0–99)
Total CHOL/HDL Ratio: 2.8 ratio
Triglycerides: 106 mg/dL (ref <150)
VLDL: 21 mg/dL (ref 0–40)

## 2023-02-13 LAB — GLUCOSE, CAPILLARY
Glucose-Capillary: 72 mg/dL (ref 70–99)
Glucose-Capillary: 111 mg/dL — ABNORMAL HIGH (ref 70–99)
Glucose-Capillary: 93 mg/dL (ref 70–99)

## 2023-02-13 LAB — TYPE AND SCREEN
ABO/RH(D): A POS
Antibody Screen: NEGATIVE

## 2023-02-13 LAB — MAGNESIUM: Magnesium: 1.9 mg/dL (ref 1.7–2.4)

## 2023-02-13 LAB — TSH: TSH: 2.501 u[IU]/mL (ref 0.350–4.500)

## 2023-02-13 LAB — FERRITIN: Ferritin: 320 ng/mL (ref 24–336)

## 2023-02-13 LAB — PROTIME-INR
INR: 1.2 (ref 0.8–1.2)
Prothrombin Time: 15.1 s (ref 11.4–15.2)

## 2023-02-13 LAB — PHOSPHORUS: Phosphorus: 3.7 mg/dL (ref 2.5–4.6)

## 2023-02-13 LAB — HEPATITIS B SURFACE ANTIGEN: Hepatitis B Surface Ag: NONREACTIVE

## 2023-02-13 LAB — TROPONIN I (HIGH SENSITIVITY)
Troponin I (High Sensitivity): 1089 ng/L (ref <18)
Troponin I (High Sensitivity): 1099 ng/L (ref <18)

## 2023-02-13 LAB — APTT
aPTT: 33 s (ref 24–36)
aPTT: 92 s — ABNORMAL HIGH (ref 24–36)

## 2023-02-13 LAB — ABO/RH: ABO/RH(D): A POS

## 2023-02-13 LAB — MRSA NEXT GEN BY PCR, NASAL: MRSA by PCR Next Gen: NOT DETECTED

## 2023-02-13 LAB — HEPARIN LEVEL (UNFRACTIONATED): Heparin Unfractionated: 0.72 [IU]/mL — ABNORMAL HIGH (ref 0.30–0.70)

## 2023-02-13 LAB — HEMOGLOBIN A1C
Hgb A1c MFr Bld: 6 % — ABNORMAL HIGH (ref 4.8–5.6)
Mean Plasma Glucose: 125.5 mg/dL

## 2023-02-13 LAB — BRAIN NATRIURETIC PEPTIDE: B Natriuretic Peptide: 1051.6 pg/mL — ABNORMAL HIGH (ref 0.0–100.0)

## 2023-02-13 MED ORDER — FUROSEMIDE 10 MG/ML IJ SOLN
80.0000 mg | Freq: Once | INTRAMUSCULAR | Status: AC
  Administered 2023-02-13: 80 mg via INTRAVENOUS
  Filled 2023-02-13: qty 8

## 2023-02-13 MED ORDER — CHLORHEXIDINE GLUCONATE CLOTH 2 % EX PADS
6.0000 | MEDICATED_PAD | Freq: Every day | CUTANEOUS | Status: DC
  Administered 2023-02-12 – 2023-02-17 (×5): 6 via TOPICAL

## 2023-02-13 MED ORDER — HEPARIN SODIUM (PORCINE) 1000 UNIT/ML IJ SOLN
3800.0000 [IU] | Freq: Once | INTRAMUSCULAR | Status: AC
  Administered 2023-02-13: 3800 [IU]
  Filled 2023-02-13: qty 4

## 2023-02-13 MED ORDER — ALBUMIN HUMAN 25 % IV SOLN
25.0000 g | Freq: Once | INTRAVENOUS | Status: AC
  Administered 2023-02-13: 25 g via INTRAVENOUS
  Filled 2023-02-13: qty 100

## 2023-02-13 MED ORDER — NEPRO/CARBSTEADY PO LIQD: 237.0000 mL | ORAL | Status: DC | PRN

## 2023-02-13 MED ORDER — INSULIN GLARGINE-YFGN 100 UNIT/ML ~~LOC~~ SOLN
20.0000 [IU] | Freq: Two times a day (BID) | SUBCUTANEOUS | Status: DC
  Administered 2023-02-13: 20 [IU] via SUBCUTANEOUS
  Filled 2023-02-13 (×2): qty 0.2

## 2023-02-13 MED ORDER — ASPIRIN 81 MG PO CHEW
81.0000 mg | CHEWABLE_TABLET | ORAL | Status: AC
  Administered 2023-02-14: 81 mg via ORAL
  Filled 2023-02-13: qty 1

## 2023-02-13 MED ORDER — ALTEPLASE 2 MG IJ SOLR: 2.0000 mg | Freq: Once | INTRAMUSCULAR | Status: DC | PRN

## 2023-02-13 MED ORDER — FAMOTIDINE 20 MG PO TABS
10.0000 mg | ORAL_TABLET | Freq: Every day | ORAL | Status: DC
  Administered 2023-02-13 – 2023-02-17 (×5): 10 mg via ORAL
  Filled 2023-02-13 (×5): qty 1

## 2023-02-13 MED ORDER — PENTAFLUOROPROP-TETRAFLUOROETH EX AERO: 1.0000 | INHALATION_SPRAY | CUTANEOUS | Status: DC | PRN

## 2023-02-13 MED ORDER — AMIODARONE HCL 200 MG PO TABS
200.0000 mg | ORAL_TABLET | Freq: Every day | ORAL | Status: DC
  Administered 2023-02-13 – 2023-02-17 (×5): 200 mg via ORAL
  Filled 2023-02-13 (×5): qty 1

## 2023-02-13 MED ORDER — HYDRALAZINE HCL 10 MG PO TABS
10.0000 mg | ORAL_TABLET | Freq: Three times a day (TID) | ORAL | Status: DC
  Administered 2023-02-13 – 2023-02-16 (×6): 10 mg via ORAL
  Filled 2023-02-13 (×9): qty 1

## 2023-02-13 MED ORDER — FLUTICASONE PROPIONATE 50 MCG/ACT NA SUSP: 1.0000 | Freq: Every day | NASAL | Status: DC | PRN

## 2023-02-13 MED ORDER — ALLOPURINOL 100 MG PO TABS
100.0000 mg | ORAL_TABLET | Freq: Every day | ORAL | Status: DC
  Administered 2023-02-13 – 2023-02-17 (×5): 100 mg via ORAL
  Filled 2023-02-13 (×5): qty 1

## 2023-02-13 MED ORDER — ANTICOAGULANT SODIUM CITRATE 4% (200MG/5ML) IV SOLN: 5.0000 mL | Status: DC | PRN

## 2023-02-13 MED ORDER — DULOXETINE HCL 60 MG PO CPEP
60.0000 mg | ORAL_CAPSULE | Freq: Every day | ORAL | Status: DC
  Administered 2023-02-13 – 2023-02-17 (×5): 60 mg via ORAL
  Filled 2023-02-13 (×5): qty 1

## 2023-02-13 MED ORDER — ATORVASTATIN CALCIUM 40 MG PO TABS
40.0000 mg | ORAL_TABLET | Freq: Every day | ORAL | Status: DC
  Administered 2023-02-13 – 2023-02-17 (×5): 40 mg via ORAL
  Filled 2023-02-13 (×5): qty 1

## 2023-02-13 MED ORDER — ALBUTEROL SULFATE (2.5 MG/3ML) 0.083% IN NEBU
2.5000 mg | INHALATION_SOLUTION | RESPIRATORY_TRACT | Status: DC | PRN
  Administered 2023-02-14: 2.5 mg via RESPIRATORY_TRACT
  Filled 2023-02-13: qty 3

## 2023-02-13 MED ORDER — LIDOCAINE HCL (PF) 1 % IJ SOLN: 5.0000 mL | INTRAMUSCULAR | Status: DC | PRN

## 2023-02-13 MED ORDER — PERFLUTREN LIPID MICROSPHERE
1.0000 mL | INTRAVENOUS | Status: AC | PRN
  Administered 2023-02-13: 2 mL via INTRAVENOUS

## 2023-02-13 MED ORDER — MOMETASONE FURO-FORMOTEROL FUM 100-5 MCG/ACT IN AERO
2.0000 | INHALATION_SPRAY | Freq: Two times a day (BID) | RESPIRATORY_TRACT | Status: DC
  Administered 2023-02-13 – 2023-02-17 (×6): 2 via RESPIRATORY_TRACT
  Filled 2023-02-13: qty 8.8

## 2023-02-13 MED ORDER — HEPARIN SODIUM (PORCINE) 5000 UNIT/ML IJ SOLN: 5000.0000 [IU] | Freq: Three times a day (TID) | INTRAMUSCULAR | Status: DC

## 2023-02-13 MED ORDER — INSULIN GLARGINE-YFGN 100 UNIT/ML ~~LOC~~ SOLN
10.0000 [IU] | Freq: Two times a day (BID) | SUBCUTANEOUS | Status: DC
  Filled 2023-02-13 (×3): qty 0.1

## 2023-02-13 MED ORDER — INSULIN ASPART 100 UNIT/ML IJ SOLN: 0.0000 [IU] | Freq: Three times a day (TID) | INTRAMUSCULAR | Status: DC

## 2023-02-13 MED ORDER — MIDODRINE HCL 5 MG PO TABS
10.0000 mg | ORAL_TABLET | ORAL | Status: DC | PRN
  Administered 2023-02-13 – 2023-02-15 (×2): 10 mg via ORAL
  Filled 2023-02-13 (×2): qty 2

## 2023-02-13 MED ORDER — SUCRALFATE 1 G PO TABS
1.0000 g | ORAL_TABLET | Freq: Two times a day (BID) | ORAL | Status: DC
  Administered 2023-02-13 – 2023-02-17 (×9): 1 g via ORAL
  Filled 2023-02-13 (×10): qty 1

## 2023-02-13 MED ORDER — NICOTINE 14 MG/24HR TD PT24
14.0000 mg | MEDICATED_PATCH | Freq: Every day | TRANSDERMAL | Status: DC
  Administered 2023-02-13 – 2023-02-17 (×3): 14 mg via TRANSDERMAL
  Filled 2023-02-13 (×4): qty 1

## 2023-02-13 MED ORDER — HEPARIN SODIUM (PORCINE) 1000 UNIT/ML DIALYSIS: 1000.0000 [IU] | INTRAMUSCULAR | Status: DC | PRN

## 2023-02-13 MED ORDER — FERROUS SULFATE 325 (65 FE) MG PO TABS
325.0000 mg | ORAL_TABLET | Freq: Every day | ORAL | Status: DC
  Administered 2023-02-13 – 2023-02-16 (×3): 325 mg via ORAL
  Filled 2023-02-13 (×4): qty 1

## 2023-02-13 MED ORDER — HEPARIN (PORCINE) 25000 UT/250ML-% IV SOLN
1300.0000 [IU]/h | INTRAVENOUS | Status: DC
  Administered 2023-02-13 – 2023-02-14 (×2): 1500 [IU]/h via INTRAVENOUS
  Filled 2023-02-13 (×2): qty 250

## 2023-02-13 MED ORDER — ASPIRIN 81 MG PO TBEC
81.0000 mg | DELAYED_RELEASE_TABLET | Freq: Every day | ORAL | Status: DC
  Administered 2023-02-13 – 2023-02-17 (×4): 81 mg via ORAL
  Filled 2023-02-13 (×4): qty 1

## 2023-02-13 MED ORDER — SODIUM CHLORIDE 0.9 % IV SOLN: INTRAVENOUS | Status: DC

## 2023-02-13 MED ORDER — ISOSORBIDE MONONITRATE ER 60 MG PO TB24
60.0000 mg | ORAL_TABLET | Freq: Every day | ORAL | Status: DC
  Administered 2023-02-13 – 2023-02-14 (×2): 60 mg via ORAL
  Filled 2023-02-13 (×2): qty 1

## 2023-02-13 MED ORDER — MIDODRINE HCL 5 MG PO TABS
10.0000 mg | ORAL_TABLET | ORAL | Status: DC
  Administered 2023-02-17: 10 mg via ORAL
  Filled 2023-02-13 (×3): qty 2

## 2023-02-13 MED ORDER — CHLORHEXIDINE GLUCONATE CLOTH 2 % EX PADS
6.0000 | MEDICATED_PAD | Freq: Every day | CUTANEOUS | Status: DC
  Administered 2023-02-13 – 2023-02-14 (×2): 6 via TOPICAL

## 2023-02-13 MED ORDER — LIDOCAINE-PRILOCAINE 2.5-2.5 % EX CREA: 1.0000 | TOPICAL_CREAM | CUTANEOUS | Status: DC | PRN

## 2023-02-13 MED ORDER — VITAMIN D 25 MCG (1000 UNIT) PO TABS
2000.0000 [IU] | ORAL_TABLET | Freq: Every day | ORAL | Status: DC
  Administered 2023-02-13 – 2023-02-17 (×5): 2000 [IU] via ORAL
  Filled 2023-02-13 (×5): qty 2

## 2023-02-13 NOTE — Progress Notes (Addendum)
ANTICOAGULATION CONSULT NOTE - Initial Consult  Pharmacy Consult for heparin Indication: atrial fibrillation  Allergies  Allergen Reactions   Hydrochlorothiazide Itching and Other (See Comments)    Unknown. Hyponatremia   Amoxicillin-Pot Clavulanate Nausea And Vomiting   Clindamycin Nausea And Vomiting and Other (See Comments)    Chest pain   Lisinopril Other (See Comments) and Cough    hyponatremia    Meloxicam Itching and Nausea Only   Topiramate Other (See Comments)   Tamsulosin Other (See Comments)    dysuria     Patient Measurements: Height: 5\' 10"  (177.8 cm) Weight: 115.6 kg (254 lb 13.6 oz) (bed) IBW/kg (Calculated) : 73 Heparin Dosing Weight: 100.4 kg  Vital Signs: Temp: 98.7 F (37.1 C) (09/03 1725) Temp Source: Oral (09/03 1725) BP: 111/63 (09/03 1725) Pulse Rate: 95 (09/03 1725)  Labs: Recent Labs    02/12/23 0003 02/13/23 0002 02/13/23 1017 02/13/23 1742  HGB 10.6*  --  10.9*  --   HCT 33.2*  --  34.2*  --   PLT 179  --  176  --   APTT 33  --   --  92*  LABPROT 15.1  --   --   --   INR 1.2  --   --   --   HEPARINUNFRC  --   --   --  0.72*  CREATININE 3.26*  --  3.03*  --   TROPONINIHS 1,099* 1,089*  --   --     Estimated Creatinine Clearance: 28.5 mL/min (A) (by C-G formula based on SCr of 3.03 mg/dL (H)).   Medical History: Past Medical History:  Diagnosis Date   Biventricular ICD (implantable cardioverter-defibrillator) - [facility] 09/05/2015   Jul 01, 2015 [facility] Dynagen X4   CAD (coronary artery disease) 09/05/2015   CABG 2015 PCI-BMS ostial LCX Sept 2015 04/05/2015 cath 50% distal LM, 90% prox LAD, old stent prox LAD 70% ISR, patent LIMA-LAD, 80% prox RCA PCI-DES x2 RCA and LCX    Chronic systolic CHF (congestive heart failure) (HCC) 09/05/2015   a. RHC 10/29/15: PCW 8, CI 2.2. Overall low filling presssures. Mean PA 20; b. echo 10/30/15: EF 45-50%, not tech suff to allwo for LV diastolic fxn, mild AI, nl appearing MVR   CKD stage 4 due to type 2  diabetes mellitus (HCC) 09/05/2015   Diabetes mellitus, type 2 (HCC) 09/05/2015   Essential hypertension 09/05/2015   History of mitral valve replacement with bioprosthetic valve 09/05/2015   31 mm Edwards 2015, Dr. Arvilla Market   Postoperative atrial fibrillation (HCC) 09/05/2015   Assessment: 67 yoM [facility] to [facility] for evaluation of syncope and bradycardia from [facility]. 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   Pharmacy has been consulted to dose heparin for afib.  Last dose of eliquis given on 02/12/2023 at ~2000-2100 prior to arrival at [facility]  aPTT 92 sec on 1500 units/hr, anti-Xa 0.72 remains falsely elevated.  Received 3800 units in HD @1609 , levels drawn @17 :42.  Level drawn appropriately.   Goal of Therapy:  Heparin level 0.3-0.7 units/ml aPTT 66-102 seconds Monitor platelets by anticoagulation protocol: Yes   Plan:  Continue heparin infusion at 1500 units/hr  8 hour confirmatory aPTT given proximity of level to heparin given in HD  Check anti-Xa and aPTT daily while  on heparin until levels correlate Continue to monitor H&H and platelets Follow up plans for potential PPM  Trixie Rude, PharmD Clinical Pharmacist 02/13/2023  7:21 PM

## 2023-02-13 NOTE — Progress Notes (Signed)
 Heart Failure Navigator Progress Note  Assessed for Heart & Vascular TOC clinic readiness.  Patient does not meet criteria due to ESRD and Dialysis.   Navigator will sign off at this time.   Joelene Barriere,RN, BSN,MSN Heart Failure Nurse Navigator. Contact by secure chat only.

## 2023-02-13 NOTE — Consult Note (Addendum)
Cardiology Consultation   Patient ID: Dominic [name] MRN: [id number]; DOB: Jul 09, 1950  Admit date: 02/12/2023 Date of Consult: 02/13/2023  PCP:  Montez Hageman, DO   [facility] Providers Cardiologist:  Thurmon Fair, MD    Patient Profile:   Dominic [name] is a 72 y.o. male with a hx of combined CHF (systolic/diastolic), ICM, CAD (PCI's, last 2016), VHD (s/p MVR bioprosthetic), COPD, ESRF on HD, DM, AFib who is being seen 02/13/2023 for the evaluation of bradycardia at the request of Dr. Flora Lipps.   Amiodarone looks like on/off for years, back on appears since at least since 2023  Hx of staph bacteremia April 2023 >>  suspicion of small vegetations on the mitral valve bioprosthesis, RV lead and tricuspid valve.  He received 6 weeks of intravenous daptomycin and oral rifampin.  TEE performed on 10/14/2021 at [facility] that did not show any evidence of vegetations on the prosthetic mitral valve or on the pacemaker leads and showed moderate aortic insufficiency. The mean mitral valve gradient was 6 mmHg at a heart rate of 92 bpm. He continued to feel ill, poor appetite and weight loss.  TTE 11/06/2021 showed LVEF 55-60%, moderate aortic stenosis, moderate-severe aortic insufficiency, mean MVR gradient 6 mm Hg at 79 bpm.  He underwent laser lead and generator extraction of the BiV-ICD on 11/17/2021 by Dr. Malvin Johns.  Blood cultures 12/28/2021 at [facility] - no growth.  Tunneled catheter was removed 12/22/2021    Subsequent echos >> LVEF down to 30-35% by TTE 06/13/22 LBBB   History of Present Illness:   [name] was recently admitted at [facility] on [facility]18/2022 with acute respiratory failure and chest discomfort.  BNP elevated at 462.  He was treated with IV diuretic.  Repeat x-ray showed improved pulmonary edema.  O2 saturation was in the 80s and that he was discharged on 2 L nasal cannula.  Serial troponin remains elevated but flat during the hospitalization, 45-->47-->40.   His torsemide was increased from 60 mg twice daily to 80 mg twice daily dosing.  He was also given albuterol as well.   He saw H. Meng 12/22/22 in f/u perhaps still a bit volume OL, in d/w Dr. Salena Saner, planned for labs and perhaps a pulse on his Torsemide if needed.  He was admitted last night with recurrent syncope >>  By chart review,  EMS was called and upon their arrival his HR was noted to be in the 30s and SBP 41. He was given epinephrine and was externally paced brought to  Admitted to [facility] ED. Upon his initial arrival his VS had normalized to 135/69 and HR 90s without pacing. Head CT was negative for AICP. His K was 5 and he was treated for hyperK. Initial labs there notable for WBC 7.6, hbg 10.6, Na 133, K 5, sCr 3.41, glucose 188, BNP 1150, and troponins x3 1359 -> 1679 -> 2105  EKG reported SR w/LBBB [facility] and further evaluation Home Toprol (37.5mg  daily) held his amiodarone continued  [facility] record Reorts initial HRs by EMS 30's SBP 70's Treated with atropine x2 without improvement and externally paced Epi gtt >> changed to nor epi Echo done there w/EF 25% by note, mod AS, , bioprosthetic MV gradient of  LABS here, 9/2 K+ 4.1 BUN/Creat 50/3.26 Mag 1.9 BNP 1051 HS Trop 1099 > 1089 WBC 7.0 H/H 10/33 Plts 179  On heparin gtt Last dose of plavix was at [facility] 02/12/23 at  7829 Does not appear to have gotten Eliquis since his admission to [facility] No metoprolol since prior to arrival at [facility]   Last dose at home 02/11/23 at 0630  He feels well currently  Reports 2 syncopal events  Tuesday walking in the house from HD and suddenly went out, no warning, woke quickly, did not seek attention, always feels a bit sick after HD though had never fainted until then. Yesterday was at a gathering/family reunion/homecoming of sorts event and again "just went out", no warning, fell down. Woke to EMS  Denies any unusual symptoms of late  Started HD last  week of July Using R sided HD cath Pending AV fistula 02/19/23 planned for RUE    Past Medical History:  Diagnosis Date   Biventricular ICD (implantable cardioverter-defibrillator) - [facility] 09/05/2015   Jul 01, 2015 [facility] Dynagen X4   CAD (coronary artery disease) 09/05/2015   CABG 2015 PCI-BMS ostial LCX Sept 2015 04/05/2015 cath 50% distal LM, 90% prox LAD, old stent prox LAD 70% ISR, patent LIMA-LAD, 80% prox RCA PCI-DES x2 RCA and LCX    Chronic systolic CHF (congestive heart failure) (HCC) 09/05/2015   a. RHC 10/29/15: PCW 8, CI 2.2. Overall low filling presssures. Mean PA 20; b. echo 10/30/15: EF 45-50%, not tech suff to allwo for LV diastolic fxn, mild AI, nl appearing MVR   CKD stage 4 due to type 2 diabetes mellitus (HCC) 09/05/2015   Diabetes mellitus, type 2 (HCC) 09/05/2015   Essential hypertension 09/05/2015   History of mitral valve replacement with bioprosthetic valve 09/05/2015   31 mm Edwards 2015, Dr. Arvilla Market   Postoperative atrial fibrillation (HCC) 09/05/2015    Past Surgical History:  Procedure Laterality Date   CARDIAC CATHETERIZATION N/A 10/29/2015   Procedure: Right Heart Cath;  Surgeon: Dolores Patty, MD;  Location: [facility] INVASIVE CV LAB;  Service: Cardiovascular;  Laterality: N/A;   CARDIAC VALVE REPLACEMENT  2015   31mm Edwards bioprosthesis   CORONARY ANGIOPLASTY     CORONARY ARTERY BYPASS GRAFT  2015   [facility], Dr. Arvilla Market.   TEE WITHOUT CARDIOVERSION N/A 04/26/2021   Procedure: TRANSESOPHAGEAL ECHOCARDIOGRAM (TEE);  Surgeon: Chrystie Nose, MD;  Location: [facility] ENDOSCOPY;  Service: Cardiovascular;  Laterality: N/A;     Home Medications:  Prior to Admission medications   Medication Sig Start Date End Date Taking? Authorizing Provider  acetaminophen (TYLENOL) 500 MG tablet Take 500 mg by mouth every 6 (six) hours as needed for moderate pain. 10/26/20  Yes [provider]  albuterol (VENTOLIN HFA) 108 (90 Base) MCG/ACT inhaler Inhale 1-2 puffs into the lungs every 4  (four) hours as needed. 11/23/22  Yes [provider]  allopurinol (ZYLOPRIM) 100 MG tablet Take 100 mg by mouth daily. 11/27/22  Yes [provider]  amiodarone (PACERONE) 200 MG tablet Take 1 tablet (200 mg total) by mouth daily. 08/17/22  Yes Croitoru, Mihai, MD  apixaban (ELIQUIS) 5 MG TABS tablet Take 5 mg by mouth in the morning and at bedtime. 12/23/21  Yes [provider]  atorvastatin (LIPITOR) 40 MG tablet Take 40 mg by mouth daily.   Yes [provider]  B Complex-C (SUPER B COMPLEX PO) Take 1 capsule by mouth daily.   Yes [provider]  Cholecalciferol (VITAMIN D) 50 MCG (2000 UT) tablet Take 2,000 Units by mouth daily.   Yes [provider]  DULoxetine (CYMBALTA) 60 MG capsule Take 60 mg by mouth daily.    Yes [provider]  famotidine (PEPCID) 20 MG tablet Take 10 mg by mouth daily.   Yes [provider]  ferrous sulfate 325 (65 FE) MG tablet Take 325 mg by mouth daily with breakfast.   Yes [provider]  fluticasone (FLONASE) 50 MCG/ACT nasal spray Place 1 spray into both nostrils daily as needed for allergies.   Yes [provider]  fluticasone-salmeterol (ADVAIR HFA) 45-21 MCG/ACT inhaler Inhale 1 puff into the lungs 2 (two) times daily. 09/18/19  Yes [provider]  fluticasone-salmeterol (ADVAIR HFA) 45-21 MCG/ACT inhaler Inhale 2 puffs into the lungs 2 (two) times daily. 04/25/22 04/25/23 Yes [provider]  gabapentin (NEURONTIN) 100 MG capsule Take 100 mg by mouth 2 (two) times daily. 03/15/22  Yes [provider]  glipiZIDE (GLUCOTROL XL) 2.5 MG 24 hr tablet Take 2.5 mg by mouth daily. 10/12/21  Yes [provider]  glucose blood test strip 1 each by Other route as needed for other. Use as instructed   Yes [provider]  hydrALAZINE (APRESOLINE) 10 MG tablet Take 1 tablet (10 mg total) by mouth 3 (three) times daily. 12/18/22  Yes Croitoru, Mihai,  MD  HYDROcodone-acetaminophen (NORCO/VICODIN) 5-325 MG tablet Take 1 tablet by mouth every 6 (six) hours as needed for moderate pain. 09/12/19  Yes [provider]  hydrocortisone cream 1 % Apply 1 application  topically daily as needed for itching.   Yes [provider]  INCRUSE ELLIPTA 62.5 MCG/ACT AEPB Inhale 1 puff into the lungs daily.   Yes [provider]  Insulin Pen Needle (PEN NEEDLES 3/16") 31G X 5 MM MISC Use with insulin pen daily 03/03/21  Yes [provider]  LANTUS SOLOSTAR 100 UNIT/ML Solostar Pen Inject 30-40 Units into the skin 2 (two) times daily. Take 40 units in the morning and 30 units in the evening 03/22/21  Yes [provider]  metoprolol succinate (TOPROL XL) 25 MG 24 hr tablet Take 1.5 tablets (37.5 mg total) by mouth daily. 02/08/22  Yes Croitoru, Mihai, MD  nitroGLYCERIN (NITROSTAT) 0.4 MG SL tablet Place 1 tablet (0.4 mg total) under the tongue every 5 (five) minutes as needed for chest pain. 06/19/22  Yes Croitoru, Mihai, MD  polyethylene glycol powder (GLYCOLAX/MIRALAX) 17 GM/SCOOP powder Take 1 Container by mouth daily. 03/24/20  Yes [provider]  potassium chloride SA (KLOR-CON M) 20 MEQ tablet Take 40 mEq by mouth 2 (two) times daily. 07/27/22 07/27/23 Yes [provider]  potassium chloride SA (KLOR-CON) 20 MEQ tablet TAKE 2 TABLETS BY MOUTH THREE TIMES A DAY 12/08/20  Yes Croitoru, Rachelle Hora, MD  Spacer/Aero-Holding Chambers (EASIVENT) inhaler by Other route. 08/10/21  Yes [provider]  sucralfate (CARAFATE) 1 g tablet Take 1 g by mouth 3 (three) times daily. 02/17/21  Yes [provider]  TECHLITE PEN NEEDLES 31G X 5 MM MISC USE WITH INSULIN PEN DAILY 03/03/21  Yes [provider]  tiZANidine (ZANAFLEX) 2 MG tablet Take 2 mg by mouth every 8 (eight) hours as needed for muscle spasms. 08/13/19  Yes [provider]  torsemide (DEMADEX) 20 MG tablet Take 3 tablets (60 mg total) by  mouth in the morning and at bedtime. 07/03/22  Yes Croitoru, Mihai, MD  isosorbide mononitrate (IMDUR) 60 MG 24 hr tablet Take 1 tablet (60 mg total) by mouth daily. 11/14/22 02/12/23  Azalee Course, PA    Inpatient Medications: Scheduled Meds:  allopurinol  100 mg Oral Daily   amiodarone  200 mg  Oral Daily   atorvastatin  40 mg Oral Daily   Chlorhexidine Gluconate Cloth  6 each Topical Daily   cholecalciferol  2,000 Units Oral Daily   DULoxetine  60 mg Oral Daily   famotidine  10 mg Oral Daily   ferrous sulfate  325 mg Oral Q breakfast   hydrALAZINE  10 mg Oral TID   insulin aspart  0-9 Units Subcutaneous TID WC   insulin glargine-yfgn  20 Units Subcutaneous BID   isosorbide mononitrate  60 mg Oral Daily   mometasone-formoterol  2 puff Inhalation BID   nicotine  14 mg Transdermal Daily   sodium chloride flush  3 mL Intravenous Q12H   sucralfate  1 g Oral BID   Continuous Infusions:  sodium chloride     heparin 1,500 Units/hr (02/13/23 0733)   PRN Meds: sodium chloride, acetaminophen, albuterol, fluticasone, ondansetron (ZOFRAN) IV, sodium chloride flush  Allergies:    Allergies  Allergen Reactions   Hydrochlorothiazide Itching and Other (See Comments)    Unknown. Hyponatremia   Amoxicillin-Pot Clavulanate Nausea And Vomiting   Clindamycin Nausea And Vomiting and Other (See Comments)    Chest pain   Lisinopril Other (See Comments) and Cough    hyponatremia    Meloxicam Itching and Nausea Only   Topiramate Other (See Comments)   Tamsulosin Other (See Comments)    dysuria     Social History:   Social History   Socioeconomic History   Marital status: Married    Spouse name: Not on file   Number of children: Not on file   Years of education: Not on file   Highest education level: Not on file  Occupational History   Not on file  Tobacco Use   Smoking status: Some Days    Current packs/day: 0.50    Average packs/day: 0.5 packs/day for 40.0 years (20.0 ttl pk-yrs)     Types: Cigarettes   Smokeless tobacco: Never   Tobacco comments:    01/02/2022- Patient smokes about 1/2 half pack daily    06/19/2022- Patient smokes about 10 cigarettes a day  Substance and Sexual Activity   Alcohol use: No    Alcohol/week: 0.0 standard drinks of alcohol   Drug use: No   Sexual activity: Not Currently  Other Topics Concern   Not on file  Social History Narrative   Not on file   Social Determinants of Health   Financial Resource Strain: Medium Risk (02/11/2023)   Received from [facility]   Overall Financial Resource Strain (CARDIA)    Difficulty of Paying Living Expenses: Somewhat hard  Food Insecurity: No Food Insecurity (02/12/2023)   Hunger Vital Sign    Worried About Running Out of Food in the Last Year: Never true    Ran Out of Food in the Last Year: Never true  Transportation Needs: No Transportation Needs (02/12/2023)   PRAPARE - Administrator, Civil Service (Medical): No    Lack of Transportation (Non-Medical): No  Physical Activity: Inactive (02/11/2023)   Received from [facility]   Exercise Vital Sign    Days of Exercise per Week: 0 days    Minutes of Exercise per Session: 0 min  Stress: No Stress Concern Present (02/11/2023)   Received from [facility] - Occupational Stress Questionnaire    Feeling of Stress : Only a little  Social Connections: Moderately Isolated (02/11/2023)   Received from [facility]  Social Advertising account executive [NHANES]    Frequency of Communication with Friends and Family: Once a week    Frequency of Social Gatherings with Friends and Family: Once a week    Attends Religious Services: More than 4 times per year    Active Member of [employer] or Organizations: No    Attends Banker Meetings: Never    Marital Status: Married  Catering manager Violence: Not At Risk (02/12/2023)   Humiliation, Afraid, Rape, and Kick questionnaire    Fear of  Current or Ex-Partner: No    Emotionally Abused: No    Physically Abused: No    Sexually Abused: No    Family History:   Family History  Problem Relation Age of Onset   Hypertension Father    Hyperlipidemia Father      ROS:  Please see the history of present illness.  All other ROS reviewed and negative.     Physical Exam/Data:   Vitals:   02/13/23 0700 02/13/23 0709 02/13/23 0757 02/13/23 0923  BP:   124/76   Pulse:   89 (!) 102  Resp:  (!) 21 16 18   Temp:   98.2 F (36.8 C)   TempSrc:   Oral   SpO2:   91%   Weight: 117.4 kg     Height:        Intake/Output Summary (Last 24 hours) at 02/13/2023 0926 Last data filed at 02/13/2023 0645 Gross per 24 hour  Intake --  Output 1100 ml  Net -1100 ml      02/13/2023    7:00 AM 02/13/2023    1:00 AM 12/22/2022   11:13 AM  Last 3 Weights  Weight (lbs) 258 lb 12.8 oz 268 lb 1.3 oz 267 lb 9.6 oz  Weight (kg) 117.391 kg 121.6 kg 121.383 kg     Body mass index is 37.13 kg/m.  General:  Well nourished, well developed, in no acute distress HEENT: normal Neck: no JVD Vascular: No carotid bruits; Distal pulses 2+ bilaterally Cardiac: RRR; 2/6 DM/systolic component as well Lungs:  clear to auscultation bilaterally, no wheezing, rhonchi or rales  Abd: soft, nontender  Ext: no edema Musculoskeletal:  No deformities Skin: warm and dry  Neuro:  no focal abnormalities noted Psych:  Normal affect   EKG:  The EKG was personally reviewed and demonstrates:   SR 87bpm, 1st degree AVBlock , LBBB ( )  Telemetry:  Telemetry was personally reviewed and demonstrates:   SR 70's-90's, occ PVCs, couplets  Relevant CV Studies:   TTE at bedside currently  06/13/2022: TTE ([facility]) SUMMARY  Left ventricular systolic function is moderate to severely reduced.  LV ejection fraction = 30-35%.  There is moderate global hypokinesis of the left ventricle.  Abnormal  paradoxical  septal motion consistent with LBBB.  The right  ventricular systolic function is normal.  The left atrium is moderately to severely dilated.  There is moderate to severe aortic stenosis.  There is moderate aortic regurgitation.  There is a bioprosthetic mitral valve.  There is moderate mitral stenosis.  There is trace mitral regurgitation.  There is mild tricuspid regurgitation.  Mild pulmonary hypertension.  Estimated right ventricular systolic pressure is 50 mmHg.  There is no pericardial effusion.  Compared to the last study dated 11-06-2021, LV systolic function has declined.  The aortic stenosis and mitral  prosthetic  stenosis have worsened.   Laboratory Data:  High Sensitivity Troponin:   Recent Labs  Lab 02/12/23 0003 02/13/23  0002  TROPONINIHS 1,099* 1,089*     Chemistry Recent Labs  Lab 02/12/23 0003  NA 134*  K 4.1  CL 105  CO2 20*  GLUCOSE 84  BUN 50*  CREATININE 3.26*  CALCIUM 8.3*  MG 1.9  GFRNONAA 19*  ANIONGAP 9    Recent Labs  Lab 02/12/23 0003  PROT 6.3*  ALBUMIN 2.2*  AST 33  ALT 26  ALKPHOS 87  BILITOT 0.6   Lipids  Recent Labs  Lab 02/12/23 0004  CHOL 128  TRIG 106  HDL 46  LDLCALC 61  CHOLHDL 2.8    Hematology Recent Labs  Lab 02/12/23 0003  WBC 7.0  RBC 3.61*  3.63*  HGB 10.6*  HCT 33.2*  MCV 92.0  MCH 29.4  MCHC 31.9  RDW 18.5*  PLT 179   Thyroid  Recent Labs  Lab 02/12/23 0003  TSH 2.501    BNP Recent Labs  Lab 02/12/23 0005  BNP 1,051.6*    DDimer No results for input(s): "DDIMER" in the last 168 hours.   Radiology/Studies:  DG CHEST PORT 1 VIEW  Result Date: 02/12/2023 CLINICAL DATA:  CHF. EXAM: PORTABLE CHEST 1 VIEW COMPARISON:  12/27/2022 FINDINGS: Right internal jugular catheter tip overlies the atrial caval junction. Heart size upper normal, prosthetic cardiac valve. Post median sternotomy. Worsening pulmonary edema. Suspected bilateral pleural effusions, left greater than right. No pneumothorax. IMPRESSION: Worsening pulmonary edema. Suspected  bilateral pleural effusions, left greater than right. Electronically Signed   By: Narda Rutherford M.D.   On: 02/12/2023 23:55     Assessment and Plan:   Recurrent syncope Witnessed HRs to high 20's-30's by EMS and transcutaneously paced Treated with atropine and epi > nor epi briefly   Hx of staph bacteremia apparently involved vale and leads ICD system extracted June 2023 Now with a HD catheter to R chest  Pending AV fistula creation next week.  Baseline conduction system disease Will need pacing support Though was on Toprol 37.5mg  daily, last dose 02/11/23 at 0630 Chronic amiodarone for his AFib  He is not currently a transvenous system candidate with indwelling HD catheter HD make his a very high infection risk with prior bacteremia pre-HD as well. Leadless device would be his only option, unfortunately not helpful for his CM/CHF     2. NSTEMI Pending HD today Likely [facility] tomorrow Cards note though suspects 2/2 profound bradycardia  3. Paroxysmal AFib Has trouble with CHF with this  Chronic amiodarone Hep gtt currently Last dose of Eliquis yesterday AM 0630  4. Bioprosthetic MV Prob severe stenosis Echo at bedside   Risk Assessment/Risk Scores:    For questions or updates, please contact [facility] Please consult [email or web address] for contact info under    Signed, Sheilah Pigeon, PA-C  02/13/2023 9:26 AM

## 2023-02-13 NOTE — Progress Notes (Signed)
Cardiology Progress Note  Patient ID: Dominic [name] MRN: [id number] DOB: 1950-11-14 Date of Encounter: 02/13/2023  Primary Cardiologist: Thurmon Fair, MD  Subjective   Chief Complaint: SOB  HPI: Reports he is short of breath.  Chest x-ray with pleural effusions.  Needs dialysis today.  ROS:  All other ROS reviewed and negative. Pertinent positives noted in the HPI.     Inpatient Medications  Scheduled Meds:  allopurinol  100 mg Oral Daily   amiodarone  200 mg Oral Daily   atorvastatin  40 mg Oral Daily   Chlorhexidine Gluconate Cloth  6 each Topical Daily   cholecalciferol  2,000 Units Oral Daily   DULoxetine  60 mg Oral Daily   famotidine  10 mg Oral Daily   ferrous sulfate  325 mg Oral Q breakfast   hydrALAZINE  10 mg Oral TID   insulin aspart  0-9 Units Subcutaneous TID WC   insulin glargine-yfgn  20 Units Subcutaneous BID   isosorbide mononitrate  60 mg Oral Daily   mometasone-formoterol  2 puff Inhalation BID   nicotine  14 mg Transdermal Daily   sodium chloride flush  3 mL Intravenous Q12H   sucralfate  1 g Oral BID   Continuous Infusions:  sodium chloride     heparin 1,500 Units/hr (02/13/23 0733)   PRN Meds: sodium chloride, acetaminophen, albuterol, fluticasone, ondansetron (ZOFRAN) IV, sodium chloride flush   Vital Signs   Vitals:   02/12/23 2348 02/13/23 0100 02/13/23 0700 02/13/23 0709  BP: 115/67     Pulse: 85     Resp: 16   (!) 21  Temp: 98.2 F (36.8 C)     TempSrc: Oral     SpO2: 94%     Weight:  121.6 kg 117.4 kg   Height:  5\' 10"  (1.778 m)      Intake/Output Summary (Last 24 hours) at 02/13/2023 0757 Last data filed at 02/13/2023 0645 Gross per 24 hour  Intake --  Output 1100 ml  Net -1100 ml      02/13/2023    7:00 AM 02/13/2023    1:00 AM 12/22/2022   11:13 AM  Last 3 Weights  Weight (lbs) 258 lb 12.8 oz 268 lb 1.3 oz 267 lb 9.6 oz  Weight (kg) 117.391 kg 121.6 kg 121.383 kg      Telemetry  Overnight telemetry shows SR 90s, which  I personally reviewed.   ECG  The most recent ECG shows sinus rhythm heart rate 86, first-degree AV block, left bundle branch block, which I personally reviewed.   Physical Exam   Vitals:   02/12/23 2348 02/13/23 0100 02/13/23 0700 02/13/23 0709  BP: 115/67     Pulse: 85     Resp: 16   (!) 21  Temp: 98.2 F (36.8 C)     TempSrc: Oral     SpO2: 94%     Weight:  121.6 kg 117.4 kg   Height:  5\' 10"  (1.778 m)      Intake/Output Summary (Last 24 hours) at 02/13/2023 0757 Last data filed at 02/13/2023 0645 Gross per 24 hour  Intake --  Output 1100 ml  Net -1100 ml       02/13/2023    7:00 AM 02/13/2023    1:00 AM 12/22/2022   11:13 AM  Last 3 Weights  Weight (lbs) 258 lb 12.8 oz 268 lb 1.3 oz 267 lb 9.6 oz  Weight (kg) 117.391 kg 121.6 kg 121.383 kg    Body mass index  is 37.13 kg/m.  General: Ill-appearing, tachypnea noted Head: Atraumatic, normal size  Eyes: PEERLA, EOMI  Neck: Supple, JVD 8 to 10 cm of water Endocrine: No thryomegaly Cardiac: Normal S1, S2; RRR; 3 out of 6 systolic ejection murmur Lungs: Diminished breath sounds bilaterally Abd: Soft, nontender, no hepatomegaly  Ext: No edema, pulses 2+ Musculoskeletal: No deformities, BUE and BLE strength normal and equal Skin: Warm and dry, no rashes   Neuro: Alert and oriented to person, place, time, and situation, CNII-XII grossly intact, no focal deficits  Psych: Normal mood and affect   Labs  High Sensitivity Troponin:   Recent Labs  Lab 02/12/23 0003 02/13/23 0002  TROPONINIHS 1,099* 1,089*     Cardiac EnzymesNo results for input(s): "TROPONINI" in the last 168 hours. No results for input(s): "TROPIPOC" in the last 168 hours.  Chemistry Recent Labs  Lab 02/12/23 0003  NA 134*  K 4.1  CL 105  CO2 20*  GLUCOSE 84  BUN 50*  CREATININE 3.26*  CALCIUM 8.3*  PROT 6.3*  ALBUMIN 2.2*  AST 33  ALT 26  ALKPHOS 87  BILITOT 0.6  GFRNONAA 19*  ANIONGAP 9    Hematology Recent Labs  Lab 02/12/23 0003   WBC 7.0  RBC 3.61*  3.63*  HGB 10.6*  HCT 33.2*  MCV 92.0  MCH 29.4  MCHC 31.9  RDW 18.5*  PLT 179   BNP Recent Labs  Lab 02/12/23 0005  BNP 1,051.6*    DDimer No results for input(s): "DDIMER" in the last 168 hours.   Radiology  DG CHEST PORT 1 VIEW  Result Date: 02/12/2023 CLINICAL DATA:  CHF. EXAM: PORTABLE CHEST 1 VIEW COMPARISON:  12/27/2022 FINDINGS: Right internal jugular catheter tip overlies the atrial caval junction. Heart size upper normal, prosthetic cardiac valve. Post median sternotomy. Worsening pulmonary edema. Suspected bilateral pleural effusions, left greater than right. No pneumothorax. IMPRESSION: Worsening pulmonary edema. Suspected bilateral pleural effusions, left greater than right. Electronically Signed   By: Narda Rutherford M.D.   On: 02/12/2023 23:55    Cardiac Studies  TTE 01/23/2022  1. Left ventricular ejection fraction, by estimation, is 45 to 50%. The  left ventricle has mildly decreased function. The left ventricle  demonstrates global hypokinesis. There is severe concentric left  ventricular hypertrophy. Left ventricular diastolic   function could not be evaluated.   2. Right ventricular systolic function is mildly reduced. The right  ventricular size is normal. There is mildly elevated pulmonary artery  systolic pressure.   3. Left atrial size was mildly dilated.   4. The mitral valve has been repaired/replaced. Mild mitral valve  regurgitation. Possible stenosis of the bioprosthetic mitral valve. The  mean mitral valve gradient is 9.0 mmHg.   5. The aortic valve is tricuspid. There is moderate calcification of the  aortic valve. There is mild thickening of the aortic valve. Aortic valve  regurgitation is mild to moderate. Moderate aortic valve stenosis. Aortic  valve area, by VTI measures 1.34  cm. Aortic valve mean gradient measures 22.0 mmHg. Aortic valve Vmax  measures 3.04 m/s.   6. Aneurysm of the ascending aorta, measuring 42  mm. Aneurysm of the  aortic root, measuring 44 mm.   7. The inferior vena cava is normal in size with greater than 50%  respiratory variability, suggesting right atrial pressure of 3 mmHg.   Patient Profile  Dominic [name] is a 72 y.o. male with CAD s/p CABG/PCI, MVR (bioprosthetic with stenosis), systolic HF (EF 45%), aortic  stenosis, ESRD, LBBB s/p CRT-D (removal of system due to endocarditis 2023), severe COPD, pAF, DM admitted on 02/12/2023 with syncope.  Course complicated by acute systolic heart failure and volume overload.  Also likely non-STEMI which is secondary.  Assessment & Plan   # Syncope # Concern for symptomatic bradycardia -Suffered a syncopal episode yesterday.  Does have a prodrome of dizziness and lightheadedness.  No seizure-like activity reported.  Now here with likely acute systolic heart failure and secondary non-STEMI. -Recently had CRT-D removed.  Had staph bacteremia.  Presumed endocarditis. -We will reach out to electrophysiology.  Possibly he is a candidate for a leadless system. -He has suffered a secondary non-STEMI. -Suspect he will end up needing a left heart catheterization.  We will likely plan for this tomorrow.  Today he needs fluid removal.  # Acute systolic heart failure, EF 45% # Volume overload, ESRD # Endocarditis status post CRT-D removal # Left bundle branch block -He is volume up.  Chest x-ray with worsening pleural effusions.  BNP 1051. -Had CRT-D in place.  Has worsening heart failure since system has been removed.  Suspect his EF is now severely reduced.  Not a good candidate for reimplantation. -We will have nephrology evaluate him this morning for dialysis.  He needs this. -No beta-blocker.  He is currently on Imdur and hydralazine.  Not a candidate for ACE/ARB/Arni/MRA given ESRD.  # Non-STEMI # CAD status post CABG/PCI -Most recent cath in 2016 he underwent PCI to circumflex and RCA. -Reports some chest discomfort with a syncopal  episode.  Currently on heparin drip. -No beta-blocker.  Hold home metoprolol. -On statin.  Lipids at goal. -Overall, suspect this is a secondary process in the setting of likely reduced LVEF.  Given his syncopal episode I do favor left and right heart catheterization.  We will likely do this tomorrow.  EP is evaluating him for possible leadless pacemaker system.  Unclear if he would be a good candidate for CRT-D reimplantation.  It seems based on notes he is not.  # Paroxysmal A-fib -Maintaining sinus rhythm on amiodarone.  On heparin drip.  Holding Eliquis.  No beta-blocker as above.  # Aortic stenosis -Moderate based on most recent echo.  Repeat pending.  # Stenosis of bioprosthetic mitral valve -Bioprosthetic mitral valve in place.  Mean gradient 9 mmHg last year.  Repeat echo.  # Severe COPD -Home inhalers.  # Tobacco abuse -Cessation advised  # Type 2 diabetes -Lantus reduced to 20 mg twice daily.  Sliding scale insulin.  A1c noted to be 6.0  FEN -No intravenous fluids -Diet: Heart healthy -Code: Full -Disposition: Likely will need left and right heart catheterization as well as evaluation by EP.  Anticipate 2-3 more days in hospital  For questions or updates, please contact [facility] Please consult [email or web address] for contact info under        Signed, Gerri Spore T. Flora Lipps, MD, [facility] [place]  [facility]  02/13/2023 7:57 AM \

## 2023-02-13 NOTE — Consult Note (Signed)
Renal Service Consult Note [facility]  Algia Trapasso 02/13/2023 Maree Krabbe, MD Requesting Physician: Dr. Antoine Poche  Reason for Consult: ESRD pt w/  HPI: The patient is a 72 y.o. year-old w/ PMH as below who presented w/ c/o syncope and bradycardia. Pt was at a family reunion and has LOC episode and hit his head. Woke up feeling SOB and nauseous w/ assoc emesis. Also some CP. EMS arrived and HR was low in the 30s and SBP 40s. He was paced and given epinephrine. Pt stated had similar event 1 wk ago after dialysis of syncopal episode. CXR showed pulm edema. Pt was admitted. We are asked to see for dialysis.   Pt seen in room. No c/o's today, some SOB w/ moving about the bed. No abd pain or fevers. Goes to [facility] for dialysis. Has clotted AVF, using TDC. Surgery planned for declot of AVF on 9/09. States he is on 3 L [place] O2 at home chronically.    ROS - denies CP, no joint pain, no HA, no blurry vision, no rash, no diarrhea, no nausea/ vomiting, no dysuria, no difficulty voiding   Past Medical History  Past Medical History:  Diagnosis Date   Biventricular ICD (implantable cardioverter-defibrillator) - [facility] 09/05/2015   Jul 01, 2015 [facility] Dynagen X4   CAD (coronary artery disease) 09/05/2015   CABG 2015 PCI-BMS ostial LCX Sept 2015 04/05/2015 cath 50% distal LM, 90% prox LAD, old stent prox LAD 70% ISR, patent LIMA-LAD, 80% prox RCA PCI-DES x2 RCA and LCX    Chronic systolic CHF (congestive heart failure) (HCC) 09/05/2015   a. RHC 10/29/15: PCW 8, CI 2.2. Overall low filling presssures. Mean PA 20; b. echo 10/30/15: EF 45-50%, not tech suff to allwo for LV diastolic fxn, mild AI, nl appearing MVR   CKD stage 4 due to type 2 diabetes mellitus (HCC) 09/05/2015   Diabetes mellitus, type 2 (HCC) 09/05/2015   Essential hypertension 09/05/2015   History of mitral valve replacement with bioprosthetic valve 09/05/2015   31 mm Edwards 2015, Dr. Arvilla Market   Postoperative atrial fibrillation  (HCC) 09/05/2015   Past Surgical History  Past Surgical History:  Procedure Laterality Date   CARDIAC CATHETERIZATION N/A 10/29/2015   Procedure: Right Heart Cath;  Surgeon: Dolores Patty, MD;  Location: [facility] INVASIVE CV LAB;  Service: Cardiovascular;  Laterality: N/A;   CARDIAC VALVE REPLACEMENT  2015   31mm Edwards bioprosthesis   CORONARY ANGIOPLASTY     CORONARY ARTERY BYPASS GRAFT  2015   [facility], Dr. Arvilla Market.   TEE WITHOUT CARDIOVERSION N/A 04/26/2021   Procedure: TRANSESOPHAGEAL ECHOCARDIOGRAM (TEE);  Surgeon: Chrystie Nose, MD;  Location: [facility] ENDOSCOPY;  Service: Cardiovascular;  Laterality: N/A;   Family History  Family History  Problem Relation Age of Onset   Hypertension Father    Hyperlipidemia Father    Social History  reports that he has been smoking cigarettes. He has a 20 pack-year smoking history. He has never used smokeless tobacco. He reports that he does not drink alcohol and does not use drugs. Allergies  Allergies  Allergen Reactions   Hydrochlorothiazide Itching and Other (See Comments)    Unknown. Hyponatremia   Amoxicillin-Pot Clavulanate Nausea And Vomiting   Clindamycin Nausea And Vomiting and Other (See Comments)    Chest pain   Lisinopril Other (See Comments) and Cough    hyponatremia    Meloxicam Itching and Nausea Only   Topiramate Other (See Comments)   Tamsulosin Other (See Comments)  dysuria    Home medications Prior to Admission medications   Medication Sig Start Date End Date Taking? Authorizing Provider  acetaminophen (TYLENOL) 500 MG tablet Take 500 mg by mouth every 6 (six) hours as needed for moderate pain. 10/26/20  Yes [provider]  albuterol (VENTOLIN HFA) 108 (90 Base) MCG/ACT inhaler Inhale 1-2 puffs into the lungs every 4 (four) hours as needed. 11/23/22  Yes [provider]  allopurinol (ZYLOPRIM) 100 MG tablet Take 100 mg by mouth daily. 11/27/22  Yes [provider]  amiodarone (PACERONE) 200 MG  tablet Take 1 tablet (200 mg total) by mouth daily. 08/17/22  Yes Croitoru, Mihai, MD  apixaban (ELIQUIS) 5 MG TABS tablet Take 5 mg by mouth in the morning and at bedtime. 12/23/21  Yes [provider]  atorvastatin (LIPITOR) 40 MG tablet Take 40 mg by mouth daily.   Yes [provider]  B Complex-C (SUPER B COMPLEX PO) Take 1 capsule by mouth daily.   Yes [provider]  Cholecalciferol (VITAMIN D) 50 MCG (2000 UT) tablet Take 2,000 Units by mouth daily.   Yes [provider]  DULoxetine (CYMBALTA) 60 MG capsule Take 60 mg by mouth daily.    Yes [provider]  famotidine (PEPCID) 20 MG tablet Take 10 mg by mouth daily.   Yes [provider]  ferrous sulfate 325 (65 FE) MG tablet Take 325 mg by mouth daily with breakfast.   Yes [provider]  fluticasone (FLONASE) 50 MCG/ACT nasal spray Place 1 spray into both nostrils daily as needed for allergies.   Yes [provider]  fluticasone-salmeterol (ADVAIR HFA) 45-21 MCG/ACT inhaler Inhale 1 puff into the lungs 2 (two) times daily. 09/18/19  Yes [provider]  fluticasone-salmeterol (ADVAIR HFA) 45-21 MCG/ACT inhaler Inhale 2 puffs into the lungs 2 (two) times daily. 04/25/22 04/25/23 Yes [provider]  gabapentin (NEURONTIN) 100 MG capsule Take 100 mg by mouth 2 (two) times daily. 03/15/22  Yes [provider]  glipiZIDE (GLUCOTROL XL) 2.5 MG 24 hr tablet Take 2.5 mg by mouth daily. 10/12/21  Yes [provider]  glucose blood test strip 1 each by Other route as needed for other. Use as instructed   Yes [provider]  hydrALAZINE (APRESOLINE) 10 MG tablet Take 1 tablet (10 mg total) by mouth 3 (three) times daily. 12/18/22  Yes Croitoru, Mihai, MD  HYDROcodone-acetaminophen (NORCO/VICODIN) 5-325 MG tablet Take 1 tablet by mouth every 6 (six) hours as needed for moderate pain. 09/12/19  Yes [provider]  hydrocortisone cream 1  % Apply 1 application  topically daily as needed for itching.   Yes [provider]  INCRUSE ELLIPTA 62.5 MCG/ACT AEPB Inhale 1 puff into the lungs daily.   Yes [provider]  Insulin Pen Needle (PEN NEEDLES 3/16") 31G X 5 MM MISC Use with insulin pen daily 03/03/21  Yes [provider]  LANTUS SOLOSTAR 100 UNIT/ML Solostar Pen Inject 30-40 Units into the skin 2 (two) times daily. Take 40 units in the morning and 30 units in the evening 03/22/21  Yes [provider]  metoprolol succinate (TOPROL XL) 25 MG 24 hr tablet Take 1.5 tablets (37.5 mg total) by mouth daily. 02/08/22  Yes Croitoru, Mihai, MD  nitroGLYCERIN (NITROSTAT) 0.4 MG SL tablet Place 1 tablet (0.4 mg total) under the tongue every 5 (five) minutes as needed for chest pain. 06/19/22  Yes Croitoru, Rachelle Hora, MD  polyethylene glycol powder (GLYCOLAX/MIRALAX)  17 GM/SCOOP powder Take 1 Container by mouth daily. 03/24/20  Yes [provider]  potassium chloride SA (KLOR-CON M) 20 MEQ tablet Take 40 mEq by mouth 2 (two) times daily. 07/27/22 07/27/23 Yes [provider]  potassium chloride SA (KLOR-CON) 20 MEQ tablet TAKE 2 TABLETS BY MOUTH THREE TIMES A DAY 12/08/20  Yes Croitoru, Rachelle Hora, MD  Spacer/Aero-Holding Chambers (EASIVENT) inhaler by Other route. 08/10/21  Yes [provider]  sucralfate (CARAFATE) 1 g tablet Take 1 g by mouth 3 (three) times daily. 02/17/21  Yes [provider]  TECHLITE PEN NEEDLES 31G X 5 MM MISC USE WITH INSULIN PEN DAILY 03/03/21  Yes [provider]  tiZANidine (ZANAFLEX) 2 MG tablet Take 2 mg by mouth every 8 (eight) hours as needed for muscle spasms. 08/13/19  Yes [provider]  torsemide (DEMADEX) 20 MG tablet Take 3 tablets (60 mg total) by mouth in the morning and at bedtime. 07/03/22  Yes Croitoru, Mihai, MD  isosorbide mononitrate (IMDUR) 60 MG 24 hr tablet Take 1 tablet (60 mg total) by mouth daily. 11/14/22 02/12/23  Azalee Course, PA      Vitals:   02/13/23 1239 02/13/23 1249 02/13/23 1300 02/13/23 1315  BP: (!) 90/54 (!) 102/51 (!) 103/50 (!) 93/54  Pulse: (!) 102 (!) 101 99   Resp: (!) 22 (!) 22 (!) 25   Temp: 98.5 F (36.9 C)     TempSrc: Oral     SpO2: 93% 92% 93%   Weight:      Height:       Exam Gen alert, no distress,  4 L [place] No rash, cyanosis or gangrene Sclera anicteric, throat clear  No jvd or bruits Chest bilat basilar crackles, soft, no wheezing RRR no MRG Abd soft ntnd no mass or ascites +bs GU defer MS no joint effusions or deformity Ext no LE or UE edema, no wounds or ulcers Neuro is alert, Ox 3 , nf    TDC R internal jugular       Home meds include - albuterol, zyloprim, amiodarone, eliquis, lipitor, cymbalta, pepcid, advair, neurontin 100 bid, glipizide, hydralazine 10 tid, norco prn, insulin lantus, metoprolol xl 37.5mg  every day, klor con, carafate, torsemide 60mg  bid not sure what days, imdur 60 qd     OP HD: [facility] TTS Dr Lequita Halt  3.5h  300/600   3K/2.5Ca bath  TDC   Heparin none    Assessment/ Plan: Syncope/ bradycardia - EP consulting Acute on chronic hypoxic resp failure - w/ pulm edema by CXR. Plan HD today next available spot. UF 3- 3.5 L as tolerated. Get good wts post HD.  ESRD - on HD TTS. Last HD Sat, has not missed. HD today as above HTN/ volume - BP's are borderline low. Holding home HTN meds and nitrates.  Anemia esrd - Hb 10-11, follow.  MBD ckd - CCa and phos are in range. Not sure binder.  NSTEMI - possible heart cath tomorrow.  ICM - ef 45-50% PAF CAD sp CABG DM2 Chronic resp failure/ COPD severe       Dominic Moselle  MD [facility] 02/13/2023, 1:16 PM  Recent Labs  Lab 02/12/23 0003 02/13/23 0159 02/13/23 1017  HGB 10.6*  --  10.9*  ALBUMIN 2.2*  --   --   CALCIUM 8.3*  --  8.5*  PHOS  --  3.7  --   CREATININE 3.26*  --  3.03*  K 4.1  --  4.0   Inpatient  medications:  allopurinol  100 mg Oral Daily   amiodarone  200 mg Oral Daily    atorvastatin  40 mg Oral Daily   Chlorhexidine Gluconate Cloth  6 each Topical Daily   Chlorhexidine Gluconate Cloth  6 each Topical Q0600   cholecalciferol  2,000 Units Oral Daily   DULoxetine  60 mg Oral Daily   famotidine  10 mg Oral Daily   ferrous sulfate  325 mg Oral Q breakfast   hydrALAZINE  10 mg Oral TID   insulin aspart  0-9 Units Subcutaneous TID WC   insulin glargine-yfgn  20 Units Subcutaneous BID   isosorbide mononitrate  60 mg Oral Daily   mometasone-formoterol  2 puff Inhalation BID   nicotine  14 mg Transdermal Daily   sodium chloride flush  3 mL Intravenous Q12H   sucralfate  1 g Oral BID    sodium chloride     anticoagulant sodium citrate     heparin 1,500 Units/hr (02/13/23 1100)   sodium chloride, acetaminophen, albuterol, alteplase, anticoagulant sodium citrate, feeding supplement (NEPRO CARB STEADY), fluticasone, heparin, lidocaine (PF), lidocaine-prilocaine, ondansetron (ZOFRAN) IV, pentafluoroprop-tetrafluoroeth, sodium chloride flush

## 2023-02-13 NOTE — Progress Notes (Signed)
2D echo atttempted, consult in room. Will try later

## 2023-02-13 NOTE — Progress Notes (Signed)
  Echocardiogram 2D Echocardiogram has been performed.  Janalyn Harder 02/13/2023, 10:38 AM

## 2023-02-13 NOTE — Plan of Care (Signed)
  Problem: Education: Goal: Knowledge of General Education information will improve Description: Including pain rating scale, medication(s)/side effects and non-pharmacologic comfort measures Outcome: Progressing   Problem: Health Behavior/Discharge Planning: Goal: Ability to manage health-related needs will improve Outcome: Progressing   Problem: Clinical Measurements: Goal: Ability to maintain clinical measurements within normal limits will improve Outcome: Progressing Goal: Will remain free from infection Outcome: Progressing Goal: Diagnostic test results will improve Outcome: Progressing Goal: Respiratory complications will improve Outcome: Progressing Goal: Cardiovascular complication will be avoided Outcome: Progressing   Problem: Elimination: Goal: Will not experience complications related to bowel motility Outcome: Progressing Goal: Will not experience complications related to urinary retention Outcome: Progressing   Problem: Pain Managment: Goal: General experience of comfort will improve Outcome: Progressing   Problem: Safety: Goal: Ability to remain free from injury will improve Outcome: Progressing   

## 2023-02-13 NOTE — Progress Notes (Addendum)
   02/13/23 1300  Vitals  BP (!) 103/50 (Albumin given as ordered by PA Collins.)   Stacie Glaze LPN-[facility]

## 2023-02-13 NOTE — Plan of Care (Signed)

## 2023-02-13 NOTE — Progress Notes (Signed)
Received patient in bed to unit.  Alert and oriented.  Informed consent signed and in chart.   TX duration:3.5 hours  Patient tolerated well.  Transported back to the room  Alert, without acute distress.  Hand-off given to patient's nurse.   Access used: Right HD Cath Access issues: none  Total UF removed: 2400 Medication(s) given: Zofran, Tylenol, Midodrine, Albumin see MAR   02/13/23 1622  Vitals  Temp 98.8 F (37.1 C)  Temp Source Oral  BP (!) 109/58  MAP (mmHg) 72  BP Location Left Arm  BP Method Automatic  Pulse Rate (!) 103  Pulse Rate Source Monitor  ECG Heart Rate (!) 104  Resp 17  MEWS COLOR  MEWS Score Color Green  Oxygen Therapy  SpO2 95 %  O2 Device Nasal Cannula  O2 Flow Rate (L/min) 4 L/min  Complaints & Interventions  Complains of Nausea /  Vomiting  Nausea relieved by Antiemetic  MEWS Score  MEWS Temp 0  MEWS Systolic 0  MEWS Pulse 1  MEWS RR 0  MEWS LOC 0  MEWS Score 1     Stacie Glaze LPN [facility]

## 2023-02-13 NOTE — Progress Notes (Addendum)
Per Dr. Marylene Buerger request, scheduled for [facility] + graft angio tomorrow with Dr. Herbie Baltimore 3rd case, orders written. Given need to be NPO after MN, also updated basal insulin dose to half of what is presently ordered (20u BID -> 10u BID). Note the 20u BID was a decrease from home dose while hospitalized - dosing plan will need to be re-evaluated for full resumption tomorrow post-cath. MAR is prompting possible cross allergy for pre-cath ASA due to allergy to meloxicam but confirmed by patient and chart that he has tolerated ASA without adverse reaction. Dr Flora Lipps recommends to start 81mg  daily today as well.

## 2023-02-13 NOTE — Progress Notes (Addendum)
   02/13/23 1318  Vitals  BP (!) 96/50 (Informed PA Collins and Dr. Arlean Hopping of UF change to 2.5L and current BP.  No other orders from Dr. Arlean Hopping.)   Also informed Dr. Arlean Hopping and PA Thomasena Edis that insertion site had been observed to be red with no drainage.    Stacie Glaze LPN

## 2023-02-13 NOTE — Progress Notes (Signed)
ANTICOAGULATION CONSULT NOTE - Initial Consult  Pharmacy Consult for heparin Indication: atrial fibrillation  Allergies  Allergen Reactions   Hydrochlorothiazide Itching and Other (See Comments)    Unknown. Hyponatremia   Amoxicillin-Pot Clavulanate Nausea And Vomiting and Other (See Comments)    unknown   Clindamycin Nausea And Vomiting, Nausea Only and Other (See Comments)    Chest pain   Lisinopril Other (See Comments) and Cough    hyponatremia    Meloxicam Itching and Nausea Only   Topiramate Other (See Comments)   Tamsulosin Other (See Comments)    dysuria     Patient Measurements: Height: 5\' 10"  (177.8 cm) Weight: 121.6 kg (268 lb 1.3 oz) IBW/kg (Calculated) : 73 Heparin Dosing Weight: 100.4 kg  Vital Signs: Temp: 98.2 F (36.8 C) (09/02 2348) Temp Source: Oral (09/02 2348) BP: 115/67 (09/02 2348) Pulse Rate: 85 (09/02 2348)  Labs: Recent Labs    02/12/23 0003 02/13/23 0002  HGB 10.6*  --   HCT 33.2*  --   PLT 179  --   APTT 33  --   LABPROT 15.1  --   INR 1.2  --   CREATININE 3.26*  --   TROPONINIHS 1,099* 1,089*    Estimated Creatinine Clearance: 27.2 mL/min (A) (by C-G formula based on SCr of 3.26 mg/dL (H)).   Medical History: Past Medical History:  Diagnosis Date   Biventricular ICD (implantable cardioverter-defibrillator) - [facility] 09/05/2015   Jul 01, 2015 [facility] Dynagen X4   CAD (coronary artery disease) 09/05/2015   CABG 2015 PCI-BMS ostial LCX Sept 2015 04/05/2015 cath 50% distal LM, 90% prox LAD, old stent prox LAD 70% ISR, patent LIMA-LAD, 80% prox RCA PCI-DES x2 RCA and LCX    Chronic systolic CHF (congestive heart failure) (HCC) 09/05/2015   a. RHC 10/29/15: PCW 8, CI 2.2. Overall low filling presssures. Mean PA 20; b. echo 10/30/15: EF 45-50%, not tech suff to allwo for LV diastolic fxn, mild AI, nl appearing MVR   CKD stage 4 due to type 2 diabetes mellitus (HCC) 09/05/2015   Diabetes mellitus, type 2 (HCC) 09/05/2015   Essential hypertension  09/05/2015   History of mitral valve replacement with bioprosthetic valve 09/05/2015   31 mm Edwards 2015, Dr. Arvilla Market   Postoperative atrial fibrillation (HCC) 09/05/2015   Assessment: 34 yoM presented to [facility] for evaluation of syncope and bradycardia [facility]. 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   Pharmacy has been consulted to dose heparin for afib. Hgb 10.6, plts 179, last dose of eliquis given on 02/12/2023 at ~2000-2100 prior to arrival at [facility].  Goal of Therapy:  Heparin level 0.3-0.7 units/ml Monitor platelets by anticoagulation protocol: Yes   Plan:  Start heparin infusion at 1500 units/hr 12 hours s/p last Eliquis dose with no bolus Check anti-Xa and aPTT level in 8 hours and daily while on heparin until levels correlate Continue to monitor H&H and platelets Follow Up plans for potential PPM  Arabella Merles 02/13/2023,2:00 AM

## 2023-02-13 NOTE — Progress Notes (Signed)
Pt receives out-pt HD at [facility] ([place] on TTS. Will assist as needed.   Olivia Canter Renal Navigator [phone]

## 2023-02-13 NOTE — Procedures (Signed)
I was present at this dialysis session, have reviewed the session and made  appropriate changes Vinson Moselle MD  [facility] 02/13/2023, 1:49 PM

## 2023-02-14 ENCOUNTER — Ambulatory Visit: Payer: No Typology Code available for payment source | Admitting: Cardiovascular Disease

## 2023-02-14 ENCOUNTER — Encounter (HOSPITAL_COMMUNITY)
Disposition: A | Discharge status: Home / Self Care | Payer: Self-pay | Source: Other Acute Inpatient Hospital | Attending: Cardiology

## 2023-02-14 DIAGNOSIS — I25118 Atherosclerotic heart disease of native coronary artery with other forms of angina pectoris: Secondary | ICD-10-CM | POA: Diagnosis not present

## 2023-02-14 DIAGNOSIS — N186 End stage renal disease: Secondary | ICD-10-CM | POA: Diagnosis not present

## 2023-02-14 DIAGNOSIS — R001 Bradycardia, unspecified: Secondary | ICD-10-CM | POA: Diagnosis not present

## 2023-02-14 DIAGNOSIS — I447 Left bundle-branch block, unspecified: Secondary | ICD-10-CM | POA: Diagnosis not present

## 2023-02-14 DIAGNOSIS — I5021 Acute systolic (congestive) heart failure: Secondary | ICD-10-CM | POA: Diagnosis not present

## 2023-02-14 HISTORY — PX: RIGHT/LEFT HEART CATH AND CORONARY/GRAFT ANGIOGRAPHY: CATH118267

## 2023-02-14 LAB — POCT I-STAT EG7
Acid-Base Excess: 0 mmol/L (ref 0.0–2.0)
Acid-Base Excess: 0 mmol/L (ref 0.0–2.0)
Acid-base deficit: 3 mmol/L — ABNORMAL HIGH (ref 0.0–2.0)
Acid-base deficit: 1 mmol/L (ref 0.0–2.0)
Bicarbonate: 21 mmol/L (ref 20.0–28.0)
Bicarbonate: 23.9 mmol/L (ref 20.0–28.0)
Bicarbonate: 23.9 mmol/L (ref 20.0–28.0)
Bicarbonate: 23.2 mmol/L (ref 20.0–28.0)
Calcium, Ion: 0.89 mmol/L — CL (ref 1.15–1.40)
Calcium, Ion: 1.13 mmol/L — ABNORMAL LOW (ref 1.15–1.40)
Calcium, Ion: 1.16 mmol/L (ref 1.15–1.40)
Calcium, Ion: 1.08 mmol/L — ABNORMAL LOW (ref 1.15–1.40)
HCT: 28 % — ABNORMAL LOW (ref 39.0–52.0)
HCT: 32 % — ABNORMAL LOW (ref 39.0–52.0)
HCT: 32 % — ABNORMAL LOW (ref 39.0–52.0)
HCT: 31 % — ABNORMAL LOW (ref 39.0–52.0)
Hemoglobin: 10.9 g/dL — ABNORMAL LOW (ref 13.0–17.0)
Hemoglobin: 10.9 g/dL — ABNORMAL LOW (ref 13.0–17.0)
Hemoglobin: 9.5 g/dL — ABNORMAL LOW (ref 13.0–17.0)
Hemoglobin: 10.5 g/dL — ABNORMAL LOW (ref 13.0–17.0)
O2 Saturation: 53 %
O2 Saturation: 56 %
O2 Saturation: 61 %
O2 Saturation: 61 %
Potassium: 3.1 mmol/L — ABNORMAL LOW (ref 3.5–5.1)
Potassium: 3.8 mmol/L (ref 3.5–5.1)
Potassium: 3.8 mmol/L (ref 3.5–5.1)
Potassium: 3.5 mmol/L (ref 3.5–5.1)
Sodium: 136 mmol/L (ref 135–145)
Sodium: 136 mmol/L (ref 135–145)
Sodium: 141 mmol/L (ref 135–145)
Sodium: 137 mmol/L (ref 135–145)
TCO2: 22 mmol/L (ref 22–32)
TCO2: 25 mmol/L (ref 22–32)
TCO2: 25 mmol/L (ref 22–32)
TCO2: 24 mmol/L (ref 22–32)
pCO2, Ven: 32.3 mmHg — ABNORMAL LOW (ref 44–60)
pCO2, Ven: 35.3 mmHg — ABNORMAL LOW (ref 44–60)
pCO2, Ven: 36 mmHg — ABNORMAL LOW (ref 44–60)
pCO2, Ven: 35.9 mmHg — ABNORMAL LOW (ref 44–60)
pH, Ven: 7.421 (ref 7.25–7.43)
pH, Ven: 7.431 — ABNORMAL HIGH (ref 7.25–7.43)
pH, Ven: 7.438 — ABNORMAL HIGH (ref 7.25–7.43)
pH, Ven: 7.418 (ref 7.25–7.43)
pO2, Ven: 27 mmHg — CL (ref 32–45)
pO2, Ven: 28 mmHg — CL (ref 32–45)
pO2, Ven: 30 mmHg — CL (ref 32–45)
pO2, Ven: 31 mmHg — CL (ref 32–45)

## 2023-02-14 LAB — CBC
HCT: 31.7 % — ABNORMAL LOW (ref 39.0–52.0)
HCT: 33.4 % — ABNORMAL LOW (ref 39.0–52.0)
Hemoglobin: 10.4 g/dL — ABNORMAL LOW (ref 13.0–17.0)
Hemoglobin: 11 g/dL — ABNORMAL LOW (ref 13.0–17.0)
MCH: 29.5 pg (ref 26.0–34.0)
MCH: 29.9 pg (ref 26.0–34.0)
MCHC: 32.8 g/dL (ref 30.0–36.0)
MCHC: 32.9 g/dL (ref 30.0–36.0)
MCV: 90.1 fL (ref 80.0–100.0)
MCV: 90.8 fL (ref 80.0–100.0)
Platelets: 171 10*3/uL (ref 150–400)
Platelets: 187 10*3/uL (ref 150–400)
RBC: 3.52 MIL/uL — ABNORMAL LOW (ref 4.22–5.81)
RBC: 3.68 MIL/uL — ABNORMAL LOW (ref 4.22–5.81)
RDW: 18.3 % — ABNORMAL HIGH (ref 11.5–15.5)
RDW: 18.2 % — ABNORMAL HIGH (ref 11.5–15.5)
WBC: 5 10*3/uL (ref 4.0–10.5)
WBC: 5.8 10*3/uL (ref 4.0–10.5)
nRBC: 0.4 % — ABNORMAL HIGH (ref 0.0–0.2)
nRBC: 0 % (ref 0.0–0.2)

## 2023-02-14 LAB — BASIC METABOLIC PANEL WITH GFR
Anion gap: 14 (ref 5–15)
BUN: 34 mg/dL — ABNORMAL HIGH (ref 8–23)
CO2: 22 mmol/L (ref 22–32)
Calcium: 8.3 mg/dL — ABNORMAL LOW (ref 8.9–10.3)
Chloride: 98 mmol/L (ref 98–111)
Creatinine, Ser: 2.38 mg/dL — ABNORMAL HIGH (ref 0.61–1.24)
GFR, Estimated: 28 mL/min — ABNORMAL LOW
Glucose, Bld: 94 mg/dL (ref 70–99)
Potassium: 3.8 mmol/L (ref 3.5–5.1)
Sodium: 134 mmol/L — ABNORMAL LOW (ref 135–145)

## 2023-02-14 LAB — GLUCOSE, CAPILLARY
Glucose-Capillary: 102 mg/dL — ABNORMAL HIGH (ref 70–99)
Glucose-Capillary: 109 mg/dL — ABNORMAL HIGH (ref 70–99)
Glucose-Capillary: 96 mg/dL (ref 70–99)
Glucose-Capillary: 93 mg/dL (ref 70–99)
Glucose-Capillary: 116 mg/dL — ABNORMAL HIGH (ref 70–99)
Glucose-Capillary: 117 mg/dL — ABNORMAL HIGH (ref 70–99)

## 2023-02-14 LAB — POCT I-STAT 7, (LYTES, BLD GAS, ICA,H+H)
Acid-base deficit: 1 mmol/L (ref 0.0–2.0)
Bicarbonate: 22 mmol/L (ref 20.0–28.0)
Calcium, Ion: 1.13 mmol/L — ABNORMAL LOW (ref 1.15–1.40)
HCT: 30 % — ABNORMAL LOW (ref 39.0–52.0)
Hemoglobin: 10.2 g/dL — ABNORMAL LOW (ref 13.0–17.0)
O2 Saturation: 88 %
Potassium: 3.8 mmol/L (ref 3.5–5.1)
Sodium: 136 mmol/L (ref 135–145)
TCO2: 23 mmol/L (ref 22–32)
pCO2 arterial: 31.9 mmHg — ABNORMAL LOW (ref 32–48)
pH, Arterial: 7.447 (ref 7.35–7.45)
pO2, Arterial: 51 mmHg — ABNORMAL LOW (ref 83–108)

## 2023-02-14 LAB — APTT: aPTT: 110 s — ABNORMAL HIGH (ref 24–36)

## 2023-02-14 LAB — HEPATITIS B SURFACE ANTIBODY, QUANTITATIVE: Hep B S AB Quant (Post): <3.5 m[IU]/mL — ABNORMAL LOW

## 2023-02-14 LAB — LIPOPROTEIN A (LPA): Lipoprotein (a): 14.1 nmol/L (ref <75.0)

## 2023-02-14 LAB — CREATININE, SERUM
Creatinine, Ser: 2.79 mg/dL — ABNORMAL HIGH (ref 0.61–1.24)
GFR, Estimated: 23 mL/min — ABNORMAL LOW (ref >60)

## 2023-02-14 SURGERY — RIGHT/LEFT HEART CATH AND CORONARY/GRAFT ANGIOGRAPHY
Anesthesia: LOCAL

## 2023-02-14 MED ORDER — INFLUENZA VAC A&B SURF ANT ADJ 0.5 ML IM SUSY
0.5000 mL | PREFILLED_SYRINGE | INTRAMUSCULAR | Status: AC
  Administered 2023-02-15: 0.5 mL via INTRAMUSCULAR
  Filled 2023-02-14: qty 0.5

## 2023-02-14 MED ORDER — SODIUM CHLORIDE 0.9 % IV SOLN: INTRAVENOUS | Status: DC

## 2023-02-14 MED ORDER — HEPARIN SODIUM (PORCINE) 1000 UNIT/ML IJ SOLN
INTRAMUSCULAR | Status: AC
  Filled 2023-02-14: qty 10

## 2023-02-14 MED ORDER — CHLORHEXIDINE GLUCONATE CLOTH 2 % EX PADS
6.0000 | MEDICATED_PAD | Freq: Every day | CUTANEOUS | Status: DC
  Administered 2023-02-14 – 2023-02-17 (×4): 6 via TOPICAL

## 2023-02-14 MED ORDER — INSULIN ASPART 100 UNIT/ML IJ SOLN
0.0000 [IU] | INTRAMUSCULAR | Status: DC
  Administered 2023-02-15 – 2023-02-17 (×3): 1 [IU] via SUBCUTANEOUS

## 2023-02-14 MED ORDER — HEPARIN (PORCINE) IN NACL 1000-0.9 UT/500ML-% IV SOLN
INTRAVENOUS | Status: DC | PRN
  Administered 2023-02-14 (×2): 500 mL

## 2023-02-14 MED ORDER — LIDOCAINE HCL (PF) 1 % IJ SOLN
INTRAMUSCULAR | Status: DC | PRN
  Administered 2023-02-14: 15 mL via INTRADERMAL

## 2023-02-14 MED ORDER — HEPARIN (PORCINE) 25000 UT/250ML-% IV SOLN
1300.0000 [IU]/h | INTRAVENOUS | Status: DC
  Administered 2023-02-15 – 2023-02-16 (×3): 1300 [IU]/h via INTRAVENOUS
  Filled 2023-02-14 (×3): qty 250

## 2023-02-14 MED ORDER — SODIUM CHLORIDE 0.9 % IV SOLN: 250.0000 mL | INTRAVENOUS | Status: DC | PRN

## 2023-02-14 MED ORDER — HYDRALAZINE HCL 20 MG/ML IJ SOLN: 10.0000 mg | INTRAMUSCULAR | Status: AC | PRN

## 2023-02-14 MED ORDER — IOHEXOL 350 MG/ML SOLN
INTRAVENOUS | Status: DC | PRN
  Administered 2023-02-14: 95 mL

## 2023-02-14 MED ORDER — SODIUM CHLORIDE 0.9% FLUSH
3.0000 mL | Freq: Two times a day (BID) | INTRAVENOUS | Status: DC
  Administered 2023-02-14 – 2023-02-17 (×3): 3 mL via INTRAVENOUS

## 2023-02-14 MED ORDER — LABETALOL HCL 5 MG/ML IV SOLN: 10.0000 mg | INTRAVENOUS | Status: AC | PRN

## 2023-02-14 MED ORDER — LIDOCAINE HCL (PF) 1 % IJ SOLN
INTRAMUSCULAR | Status: AC
  Filled 2023-02-14: qty 30

## 2023-02-14 MED ORDER — SODIUM CHLORIDE 0.9% FLUSH: 3.0000 mL | INTRAVENOUS | Status: DC | PRN

## 2023-02-14 MED ORDER — ISOSORBIDE MONONITRATE ER 30 MG PO TB24
30.0000 mg | ORAL_TABLET | Freq: Every day | ORAL | Status: DC
  Administered 2023-02-15 – 2023-02-17 (×3): 30 mg via ORAL
  Filled 2023-02-14 (×3): qty 1

## 2023-02-14 MED ORDER — NITROGLYCERIN 1 MG/10 ML FOR IR/CATH LAB
INTRA_ARTERIAL | Status: AC
  Filled 2023-02-14: qty 10

## 2023-02-14 MED ORDER — VERAPAMIL HCL 2.5 MG/ML IV SOLN
INTRAVENOUS | Status: AC
  Filled 2023-02-14: qty 2

## 2023-02-14 SURGICAL SUPPLY — 14 items
CATH INFINITI 5 FR IM (CATHETERS) IMPLANT
CATH INFINITI 5FR MULTPACK ANG (CATHETERS) IMPLANT
CATH SWAN GANZ 7F STRAIGHT (CATHETERS) IMPLANT
CLOSURE MYNX CONTROL 5F (Vascular Products) IMPLANT
KIT MICROPUNCTURE NIT STIFF (SHEATH) IMPLANT
MAT PREVALON FULL STRYKER (MISCELLANEOUS) IMPLANT
PACK CARDIAC CATHETERIZATION (CUSTOM PROCEDURE TRAY) ×1 IMPLANT
SET ATX-X65L (MISCELLANEOUS) IMPLANT
SHEATH PINNACLE 5F 10CM (SHEATH) IMPLANT
SHEATH PINNACLE 7F 10CM (SHEATH) IMPLANT
SHEATH PROBE COVER 6X72 (BAG) IMPLANT
WIRE EMERALD 3MM-J .025X260CM (WIRE) IMPLANT
WIRE EMERALD 3MM-J .035X150CM (WIRE) IMPLANT
WIRE EMERALD 3MM-J .035X260CM (WIRE) IMPLANT

## 2023-02-14 NOTE — Inpatient Diabetes Management (Addendum)
Inpatient Diabetes Program Recommendations  AACE/ADA: New Consensus Statement on Inpatient Glycemic Control (2015)  Target Ranges:  Prepandial:   less than 140 mg/dL      Peak postprandial:   less than 180 mg/dL (1-2 hours)      Critically ill patients:  140 - 180 mg/dL   Lab Results  Component Value Date   GLUCAP 96 02/14/2023   HGBA1C 6.0 (H) 02/12/2023    Review of Glycemic Control  Latest Reference Range & Units 02/13/23 09:15 02/13/23 10:50 02/13/23 17:14 02/13/23 21:09 02/14/23 05:55 02/14/23 09:04  Glucose-Capillary 70 - 99 mg/dL Semglee 20 units  387 (H) 102 (H) 93 109 (H) 96  (H): Data is abnormally high  Diabetes history: DM2 Outpatient Diabetes medications:  Glipizide 2.5 mg QAM Lantus 40 units QAM and 30 units at bedtime A1C 6% Current orders for Inpatient glycemic control:  Semglee 20 units QD  Received referral for "discharge recommendations and moving target insulin dosing".  Current A1C is 6%.  NPO for cath today and TEE tomorrow.  MD plans to hold Semglee 20 units this am.    While NPO-Please order Novolog 0-9 units Q4H.  Once eating change to ac/hs.    Addendum@10 :64:  Spoke with patient at bedside to confirm home DM medications.  He confirms he takes Lantus 40 units QAM and 30 units at bedtime & Glipizide 2.5  mg QAM.  He has not required much insulin here.  He has occasional episodes of hypoglycemia.  He states he feels his lows and corrects with juice.  He lives with his wife and she makes sure he administers his insulins as prescribed.  As far as discharge recommendations-continue home regimen and keep close follow up with PCP.    Will continue to follow while inpatient.  Thank you, Dulce Sellar, MSN, CDCES Diabetes Coordinator Inpatient Diabetes Program [phone] (team pager from 8a-5p)

## 2023-02-14 NOTE — Progress Notes (Signed)
Rounding Note    Patient Name: Dominic [name] Date of Encounter: 02/14/2023  [facility] Cardiologist: Thurmon Fair, MD   Subjective   Doing OK, SOB, sounds chronic, on home O2, no CP  Inpatient Medications    Scheduled Meds:  allopurinol  100 mg Oral Daily   amiodarone  200 mg Oral Daily   aspirin EC  81 mg Oral Daily   atorvastatin  40 mg Oral Daily   Chlorhexidine Gluconate Cloth  6 each Topical Daily   Chlorhexidine Gluconate Cloth  6 each Topical Q0600   cholecalciferol  2,000 Units Oral Daily   DULoxetine  60 mg Oral Daily   famotidine  10 mg Oral Daily   ferrous sulfate  325 mg Oral Q breakfast   hydrALAZINE  10 mg Oral TID   insulin aspart  0-9 Units Subcutaneous TID WC   insulin glargine-yfgn  10 Units Subcutaneous BID   isosorbide mononitrate  60 mg Oral Daily   [START ON 02/15/2023] midodrine  10 mg Oral Q T,Th,Sa-HD   mometasone-formoterol  2 puff Inhalation BID   nicotine  14 mg Transdermal Daily   sodium chloride flush  3 mL Intravenous Q12H   sucralfate  1 g Oral BID   Continuous Infusions:  sodium chloride     sodium chloride 10 mL/hr at 02/14/23 0610   heparin 1,500 Units/hr (02/14/23 0018)   PRN Meds: sodium chloride, acetaminophen, albuterol, fluticasone, midodrine, ondansetron (ZOFRAN) IV, sodium chloride flush   Vital Signs    Vitals:   02/13/23 2119 02/13/23 2300 02/14/23 0500 02/14/23 0734  BP:  98/65  103/63  Pulse: 95 90 92 90  Resp: 17 18 18 16   Temp:  98.9 F (37.2 C)  98.6 F (37 C)  TempSrc:  Oral  Oral  SpO2: 94% 90% (!) 88% 91%  Weight:   113.8 kg   Height:        Intake/Output Summary (Last 24 hours) at 02/14/2023 0825 Last data filed at 02/14/2023 0500 Gross per 24 hour  Intake 506.17 ml  Output 2700 ml  Net -2193.83 ml      02/14/2023    5:00 AM 02/13/2023    4:24 PM 02/13/2023   12:35 PM  Last 3 Weights  Weight (lbs) 250 lb 14.1 oz 254 lb 13.6 oz 261 lb 0.4 oz  Weight (kg) 113.8 kg 115.6 kg 118.4 kg       Telemetry    SR, PACs, 90's no bradycardia  - Personally Reviewed  ECG    No new EKGs - Personally Reviewed  Physical Exam   GEN: No acute distress.   Neck: No JVD Cardiac: RRR, 2-3 DM, rubs, or gallops.  Respiratory: CTA b/l GI: Soft, nontender, non-distended  MS: No edema; No deformity. Neuro:  Nonfocal  Psych: Normal affect   Labs    High Sensitivity Troponin:   Recent Labs  Lab 02/12/23 0003 02/13/23 0002  TROPONINIHS 1,099* 1,089*     Chemistry Recent Labs  Lab 02/12/23 0003 02/13/23 1017 02/14/23 0221  NA 134* 136 134*  K 4.1 4.0 3.8  CL 105 103 98  CO2 20* 20* 22  GLUCOSE 84 134* 94  BUN 50* 50* 34*  CREATININE 3.26* 3.03* 2.38*  CALCIUM 8.3* 8.5* 8.3*  MG 1.9  --   --   PROT 6.3*  --   --   ALBUMIN 2.2*  --   --   AST 33  --   --   ALT 26  --   --  ALKPHOS 87  --   --   BILITOT 0.6  --   --   GFRNONAA 19* 21* 28*  ANIONGAP 9 13 14     Lipids  Recent Labs  Lab 02/12/23 0004  CHOL 128  TRIG 106  HDL 46  LDLCALC 61  CHOLHDL 2.8    Hematology Recent Labs  Lab 02/12/23 0003 02/13/23 1017 02/14/23 0221  WBC 7.0 6.8 5.0  RBC 3.61*  3.63* 3.83* 3.52*  HGB 10.6* 10.9* 10.4*  HCT 33.2* 34.2* 31.7*  MCV 92.0 89.3 90.1  MCH 29.4 28.5 29.5  MCHC 31.9 31.9 32.8  RDW 18.5* 18.3* 18.3*  PLT 179 176 171   Thyroid  Recent Labs  Lab 02/12/23 0003  TSH 2.501    BNP Recent Labs  Lab 02/12/23 0005  BNP 1,051.6*    DDimer No results for input(s): "DDIMER" in the last 168 hours.   Radiology      Cardiac Studies    02/13/23: TTE 1. There is no left ventricular thrombus (Definity contrast was used).  There is severe septal-lateral dyssynchrony due to LBBB. Left ventricular  ejection fraction, by estimation, is 35 to 40%. The left ventricle has  moderately decreased function. The  left ventricle demonstrates global hypokinesis. The left ventricular  internal cavity size was mildly dilated. There is mild concentric left   ventricular hypertrophy. Indeterminate diastolic filling due to E-A  fusion.   2. Right ventricular systolic function is moderately reduced. The right  ventricular size is moderately enlarged. There is moderately elevated  pulmonary artery systolic pressure. The estimated right ventricular  systolic pressure is 57.3 mmHg.   3. Left atrial size was severely dilated.   4. Right atrial size was moderately dilated.   5. The mitral valve has been repaired/replaced. No evidence of mitral  valve regurgitation. Severe mitral stenosis. The mean mitral valve  gradient is 18.0 mmHg with average heart rate of 95 bpm. There is a 31 mm  Edwards Sapien bioprosthetic valve present   in the mitral position. Procedure Date: 09/05/2015. Echo findings are  consistent with stenosis the mitral prosthesis.   6. Large pleural effusion.   7. Tricuspid valve regurgitation is moderate to severe.   8. The aortic valve is tricuspid. There is moderate calcification of the  aortic valve. There is moderate thickening of the aortic valve. Aortic  valve regurgitation is moderate. Moderate aortic valve stenosis. Aortic  regurgitation PHT measures 319 msec.  Aortic valve mean gradient measures 23.3 mmHg. Aortic valve Vmax measures  3.21 m/s.   9. Aortic dilatation noted. There is mild dilatation of the ascending  aorta, measuring 39 mm. There is mild dilatation of the aortic root,  measuring 42 mm.  10. Hepatic vein flow reversal is seen is seen intermittently (likely with  respiration, spirometry was not performed). The inferior vena cava is  dilated in size with >50% respiratory variability, suggesting right atrial  pressure of 8 mmHg.   Comparison(s): The left ventricular function is worsened. There is marked  increase in mitral valve prosthesis gradients, which may be partly related  to the increase in heart rate, but could also be due to prosthetic valve  endocarditis. Pulmonary artery  hypertension is worse,  likley due to increase left heart pressures. Aortic  stenosis is only slightly worse.   06/13/2022: TTE ([facility]) SUMMARY  Left ventricular systolic function is moderate to severely reduced.  LV ejection fraction = 30-35%.  There is moderate global hypokinesis of the left ventricle.  Abnormal  paradoxical  septal motion consistent with LBBB.  The right ventricular systolic function is normal.  The left atrium is moderately to severely dilated.  There is moderate to severe aortic stenosis.  There is moderate aortic regurgitation.  There is a bioprosthetic mitral valve.  There is moderate mitral stenosis.  There is trace mitral regurgitation.  There is mild tricuspid regurgitation.  Mild pulmonary hypertension.  Estimated right ventricular systolic pressure is 50 mmHg.  There is no pericardial effusion.  Compared to the last study dated 11-06-2021, LV systolic function has declined.  The aortic stenosis and mitral  prosthetic  stenosis have worsened.   Patient Profile     72 y.o. male combined CHF (systolic/diastolic), ICM, CAD, VHD (s/p MVR bioprosthetic/CABG, PCIs), COPD (on home O2 3L), ESRF on HD, DM, AFib admitted with syncope  Hx of: staph bacteremia April 2023 >>  suspicion of small vegetations on the mitral valve bioprosthesis, RV lead and tricuspid valve.  He received 6 weeks of intravenous daptomycin and oral rifampin.  [facility]E performed on 10/14/2021 [facility] that did not show any evidence of vegetations on the prosthetic mitral valve or on the pacemaker leads and showed moderate aortic insufficiency. The mean mitral valve gradient was 6 mmHg at a heart rate of 92 bpm. He continued to feel ill, poor appetite and weight loss.  TTE 11/06/2021 showed LVEF 55-60%, moderate aortic stenosis, moderate-severe aortic insufficiency, mean MVR gradient 6 mm Hg at 79 bpm.  He underwent laser lead and generator extraction of the BiV-ICD on 11/17/2021 by Dr. Malvin Johns.  Blood cultures  12/28/2021 at [facility] - no growth.  Tunneled catheter was removed 12/22/2021      Subsequent echos >> LVEF down to 30-35% by TTE 06/13/22 >>  This admision 35-40% RV mildly reduced  LBBB  Assessment & Plan    Recurrent syncope Witnessed HRs to high 20's-30's by EMS and transcutaneously paced Treated with atropine and epi > nor epi briefly    Hx of staph bacteremia apparently involved valve and leads ICD system extracted June 2023 Now with a HD catheter to R chest ) Pending AV fistula next week 02/19/23.(RUE fistula clotted) Typically we do not like go back to prior extracted site (prior on the left)  Will need brady pacing support His only option is leadless at this juncture Unfortunately this will not be able to provide any kind of CRT support and with RV pace.  Hopefully not too much/too often Plane for Micra AV   C/w attending/primary cardiology team:  2. Paroxysmal AFib CHA2DS2Vasc is 4, >> home OAC held heparin here Remains on amiodarone  Resume BB once he has pacing support  3. VHD Severe prosthetic MV stenosis  4. Acute/chronic CHF (systolic) Volume w/ HD  5. NSTEMI Suspect 2/2 profound bradycardia   Pending R/LHC today TEE tomorrow Probably Micra timing for Friday     For questions or updates, please contact [facility] Please consult [email or web address] for contact info under        Signed, Sheilah Pigeon, PA-C  02/14/2023, 8:25 AM

## 2023-02-14 NOTE — Plan of Care (Signed)

## 2023-02-14 NOTE — Interval H&P Note (Signed)
History and Physical Interval Note:  02/14/2023 1:49 PM  Dominic [name]  has presented today for surgery, with the diagnosis of nstemi.  The various methods of treatment have been discussed with the patient and family. After consideration of risks, benefits and other options for treatment, the patient has consented to  Procedure(s): RIGHT/LEFT HEART CATH AND CORONARY/GRAFT ANGIOGRAPHY (N/A)  PERCUTANEOUS CORONARY INTEVENTION  as a surgical intervention.  The patient's history has been reviewed, patient examined, no change in status, stable for surgery.  I have reviewed the patient's chart and labs.  Questions were answered to the patient's satisfaction.     Cath Lab Visit (complete for each Cath Lab visit)  Clinical Evaluation Leading to the Procedure:   ACS: No.  Non-ACS:    Anginal Classification: No Symptoms  Anti-ischemic medical therapy: Maximal Therapy (2 or more classes of medications)  Non-Invasive Test Results: Equivocal test results  Prior CABG: Previous CABG   Bryan Lemma

## 2023-02-14 NOTE — Progress Notes (Signed)
Cardiology Progress Note  Patient ID: Dominic [name] MRN: [id number] DOB: 1950-08-05 Date of Encounter: 02/14/2023  Primary Cardiologist: Thurmon Fair, MD  Subjective   Chief Complaint: SOB  HPI: Left heart catheterization today.  LVEF is reduced at 35%.  Severe stenosis of prosthetic mitral valve.  Net -2.1 L with dialysis.  ROS:  All other ROS reviewed and negative. Pertinent positives noted in the HPI.     Inpatient Medications  Scheduled Meds:  allopurinol  100 mg Oral Daily   amiodarone  200 mg Oral Daily   aspirin EC  81 mg Oral Daily   atorvastatin  40 mg Oral Daily   Chlorhexidine Gluconate Cloth  6 each Topical Daily   Chlorhexidine Gluconate Cloth  6 each Topical Q0600   cholecalciferol  2,000 Units Oral Daily   DULoxetine  60 mg Oral Daily   famotidine  10 mg Oral Daily   ferrous sulfate  325 mg Oral Q breakfast   hydrALAZINE  10 mg Oral TID   insulin aspart  0-9 Units Subcutaneous TID WC   insulin glargine-yfgn  10 Units Subcutaneous BID   isosorbide mononitrate  60 mg Oral Daily   [START ON 02/15/2023] midodrine  10 mg Oral Q T,Th,Sa-HD   mometasone-formoterol  2 puff Inhalation BID   nicotine  14 mg Transdermal Daily   sodium chloride flush  3 mL Intravenous Q12H   sucralfate  1 g Oral BID   Continuous Infusions:  sodium chloride     sodium chloride 10 mL/hr at 02/14/23 0610   heparin 1,300 Units/hr (02/14/23 0846)   PRN Meds: sodium chloride, acetaminophen, albuterol, fluticasone, midodrine, ondansetron (ZOFRAN) IV, sodium chloride flush   Vital Signs   Vitals:   02/14/23 0500 02/14/23 0734 02/14/23 0800 02/14/23 0918  BP:  103/63 109/63   Pulse: 92 90 89 90  Resp: 18 16 16 18   Temp:  98.6 F (37 C)    TempSrc:  Oral    SpO2: (!) 88% 91% 91% 92%  Weight: 113.8 kg     Height:        Intake/Output Summary (Last 24 hours) at 02/14/2023 0930 Last data filed at 02/14/2023 0500 Gross per 24 hour  Intake 506.17 ml  Output 2700 ml  Net -2193.83 ml       02/14/2023    5:00 AM 02/13/2023    4:24 PM 02/13/2023   12:35 PM  Last 3 Weights  Weight (lbs) 250 lb 14.1 oz 254 lb 13.6 oz 261 lb 0.4 oz  Weight (kg) 113.8 kg 115.6 kg 118.4 kg      Telemetry  Overnight telemetry shows sinus rhythm 80s, which I personally reviewed.   Physical Exam   Vitals:   02/14/23 0500 02/14/23 0734 02/14/23 0800 02/14/23 0918  BP:  103/63 109/63   Pulse: 92 90 89 90  Resp: 18 16 16 18   Temp:  98.6 F (37 C)    TempSrc:  Oral    SpO2: (!) 88% 91% 91% 92%  Weight: 113.8 kg     Height:        Intake/Output Summary (Last 24 hours) at 02/14/2023 0930 Last data filed at 02/14/2023 0500 Gross per 24 hour  Intake 506.17 ml  Output 2700 ml  Net -2193.83 ml       02/14/2023    5:00 AM 02/13/2023    4:24 PM 02/13/2023   12:35 PM  Last 3 Weights  Weight (lbs) 250 lb 14.1 oz 254 lb 13.6 oz 261  lb 0.4 oz  Weight (kg) 113.8 kg 115.6 kg 118.4 kg    Body mass index is 36 kg/m.  General: Well nourished, well developed, in no acute distress Head: Atraumatic, normal size  Eyes: PEERLA, EOMI  Neck: Supple, JVD 8 to 10 cm of water Endocrine: No thryomegaly Cardiac: Normal S1, S2; RRR; 2 out of 6 systolic ejection murmur, diastolic rumble Lungs: Clear to auscultation bilaterally, no wheezing, rhonchi or rales  Abd: Soft, nontender, no hepatomegaly  Ext: No edema, pulses 2+ Musculoskeletal: No deformities, BUE and BLE strength normal and equal Skin: Warm and dry, no rashes   Neuro: Alert and oriented to person, place, time, and situation, CNII-XII grossly intact, no focal deficits  Psych: Normal mood and affect   Labs  High Sensitivity Troponin:   Recent Labs  Lab 02/12/23 0003 02/13/23 0002  TROPONINIHS 1,099* 1,089*     Cardiac EnzymesNo results for input(s): "TROPONINI" in the last 168 hours. No results for input(s): "TROPIPOC" in the last 168 hours.  Chemistry Recent Labs  Lab 02/12/23 0003 02/13/23 1017 02/14/23 0221  NA 134* 136 134*  K 4.1 4.0  3.8  CL 105 103 98  CO2 20* 20* 22  GLUCOSE 84 134* 94  BUN 50* 50* 34*  CREATININE 3.26* 3.03* 2.38*  CALCIUM 8.3* 8.5* 8.3*  PROT 6.3*  --   --   ALBUMIN 2.2*  --   --   AST 33  --   --   ALT 26  --   --   ALKPHOS 87  --   --   BILITOT 0.6  --   --   GFRNONAA 19* 21* 28*  ANIONGAP 9 13 14     Hematology Recent Labs  Lab 02/12/23 0003 02/13/23 1017 02/14/23 0221  WBC 7.0 6.8 5.0  RBC 3.61*  3.63* 3.83* 3.52*  HGB 10.6* 10.9* 10.4*  HCT 33.2* 34.2* 31.7*  MCV 92.0 89.3 90.1  MCH 29.4 28.5 29.5  MCHC 31.9 31.9 32.8  RDW 18.5* 18.3* 18.3*  PLT 179 176 171   BNP Recent Labs  Lab 02/12/23 0005  BNP 1,051.6*    DDimer No results for input(s): "DDIMER" in the last 168 hours.   Radiology  ECHOCARDIOGRAM COMPLETE  Result Date: 02/13/2023    ECHOCARDIOGRAM REPORT   Patient Name:   Dominic [name] Date of Exam: 02/13/2023 Medical Rec #:  [id number]      Height:       70.0 in Accession #:    [id number]     Weight:       258.8 lb Date of Birth:  11-21-1950      BSA:          2.329 m Patient Age:    72 years       BP:           124/76 mmHg Patient Gender: M              HR:           94 bpm. Exam Location:  Inpatient Procedure: 2D Echo, Color Doppler, Cardiac Doppler and Intracardiac            Opacification Agent Indications:    I44.7 LBBB  History:        Patient has prior history of Echocardiogram examinations, most                 recent 01/23/2022. CHF, Abnormal ECG, Defibrillator and Prior  CABG, COPD, Endocarditis, Mitral Stenosis, Mitral Valve Disease                 and Aortic Valve Disease, Arrythmias:LBBB,                 Signs/Symptoms:Syncope; Risk Factors:Current Smoker and                 Hypertension. ESRD. MVR.                  Mitral Valve: 31 mm Edwards Sapien bioprosthetic valve valve is                 present in the mitral position. Procedure Date: 09/05/2015.  Sonographer:    Sheralyn Boatman RDCS Referring Phys: [id number] [facility]  Sonographer Comments:  Technically difficult study due to poor echo windows. IMPRESSIONS  1. There is no left ventricular thrombus (Definity contrast was used). There is severe septal-lateral dyssynchrony due to LBBB. Left ventricular ejection fraction, by estimation, is 35 to 40%. The left ventricle has moderately decreased function. The left ventricle demonstrates global hypokinesis. The left ventricular internal cavity size was mildly dilated. There is mild concentric left ventricular hypertrophy. Indeterminate diastolic filling due to E-A fusion.  2. Right ventricular systolic function is moderately reduced. The right ventricular size is moderately enlarged. There is moderately elevated pulmonary artery systolic pressure. The estimated right ventricular systolic pressure is 57.3 mmHg.  3. Left atrial size was severely dilated.  4. Right atrial size was moderately dilated.  5. The mitral valve has been repaired/replaced. No evidence of mitral valve regurgitation. Severe mitral stenosis. The mean mitral valve gradient is 18.0 mmHg with average heart rate of 95 bpm. There is a 31 mm Edwards Sapien bioprosthetic valve present  in the mitral position. Procedure Date: 09/05/2015. Echo findings are consistent with stenosis the mitral prosthesis.  6. Large pleural effusion.  7. Tricuspid valve regurgitation is moderate to severe.  8. The aortic valve is tricuspid. There is moderate calcification of the aortic valve. There is moderate thickening of the aortic valve. Aortic valve regurgitation is moderate. Moderate aortic valve stenosis. Aortic regurgitation PHT measures 319 msec. Aortic valve mean gradient measures 23.3 mmHg. Aortic valve Vmax measures 3.21 m/s.  9. Aortic dilatation noted. There is mild dilatation of the ascending aorta, measuring 39 mm. There is mild dilatation of the aortic root, measuring 42 mm. 10. Hepatic vein flow reversal is seen is seen intermittently (likely with respiration, spirometry was not performed). The  inferior vena cava is dilated in size with >50% respiratory variability, suggesting right atrial pressure of 8 mmHg. Comparison(s): The left ventricular function is worsened. There is marked increase in mitral valve prosthesis gradients, which may be partly related to the increase in heart rate, but could also be due to prosthetic valve endocarditis. Pulmonary artery hypertension is worse, likley due to increase left heart pressures. Aortic stenosis is only slightly worse. 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 Consider TEE for evaluation for endocarditis of the mitral valve prosthesis. Consider a component of constrictive physiology (evaluate mitral flow and hepatic vein flow with spirometry). FINDINGS  Left Ventricle: There is no left ventricular thrombus (Definity contrast was used). There is severe septal-lateral dyssynchrony due to LBBB. Left ventricular ejection fraction, by estimation, is 35 to 40%. The left ventricle has moderately decreased function. The left ventricle demonstrates global hypokinesis. Definity contrast agent was given IV to delineate the left ventricular endocardial borders. The left ventricular  internal cavity size was mildly dilated. There is mild concentric left ventricular hypertrophy. Abnormal (paradoxical) septal motion, consistent with left bundle branch block. Indeterminate diastolic filling due to E-A fusion. Right Ventricle: The right ventricular size is moderately enlarged. Right vetricular wall thickness was not well visualized. Right ventricular systolic function is moderately reduced. There is moderately elevated pulmonary artery systolic pressure. The tricuspid regurgitant velocity is 3.51 m/s, and with an assumed right atrial pressure of 8 mmHg, the estimated right ventricular systolic pressure is 57.3 mmHg. Left Atrium: Left atrial size was severely dilated. Right Atrium: Right atrial size was moderately dilated.  Pericardium: There is no evidence of pericardial effusion. Mitral Valve: The mitral valve has been repaired/replaced. No evidence of mitral valve regurgitation. There is a 31 mm Edwards Sapien bioprosthetic valve present in the mitral position. Procedure Date: 09/05/2015. 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 Severe mitral valve stenosis. MV peak gradient, 25.6 mmHg. The mean mitral valve gradient is 18.0 mmHg with average heart rate of 95 bpm. Tricuspid Valve: The tricuspid valve is grossly normal. Tricuspid valve regurgitation is moderate to severe. No evidence of tricuspid stenosis. Aortic Valve: The aortic valve is tricuspid. There is moderate calcification of the aortic valve. There is moderate thickening of the aortic valve. Aortic valve regurgitation is moderate. Aortic regurgitation PHT measures 319 msec. Moderate aortic stenosis is present. Aortic valve mean gradient measures 23.3 mmHg. Aortic valve peak gradient measures 41.1 mmHg. Aortic valve area, by VTI measures 1.26 cm. Pulmonic Valve: The pulmonic valve was grossly normal. Pulmonic valve regurgitation is not visualized. No evidence of pulmonic stenosis. Aorta: Aortic dilatation noted. There is mild dilatation of the ascending aorta, measuring 39 mm. There is mild dilatation of the aortic root, measuring 42 mm. Venous: Hepatic vein flow reversal is seen is seen intermittently (likely with respiration, spirometry was not performed). The inferior vena cava is dilated in size with greater than 50% respiratory variability, suggesting right atrial pressure of 8 mmHg. The inferior vena cava and the hepatic vein show a pattern of systolic flow reversal, suggestive of tricuspid regurgitation. IAS/Shunts: No atrial level shunt detected by color flow Doppler. Additional Comments: There is a large pleural effusion.  LEFT VENTRICLE PLAX 2D LVIDd:         6.00 cm      Diastology LVIDs:         4.80 cm      LV e' medial:  3.48 cm/s LV  PW:         1.30 cm      LV e' lateral: 7.40 cm/s LV IVS:        1.40 cm LVOT diam:     2.20 cm LV SV:         71 LV SV Index:   30 LVOT Area:     3.80 cm  LV Volumes (MOD) LV vol d, MOD A2C: 142.5 ml LV vol d, MOD A4C: 162.0 ml LV vol s, MOD A2C: 87.3 ml LV vol s, MOD A4C: 101.4 ml LV SV MOD A2C:     55.2 ml LV SV MOD A4C:     162.0 ml LV SV MOD BP:      57.0 ml RIGHT VENTRICLE            IVC RV S prime:     6.31 cm/s  IVC diam: 2.70 cm TAPSE (M-mode): 1.0 cm LEFT ATRIUM             Index  RIGHT ATRIUM           Index LA diam:        5.20 cm 2.23 cm/m   RA Area:     16.40 cm LA Vol (A2C):   53.9 ml 23.14 ml/m  RA Volume:   43.90 ml  18.85 ml/m LA Vol (A4C):   70.0 ml 30.06 ml/m LA Biplane Vol: 65.2 ml 28.00 ml/m  AORTIC VALVE AV Area (Vmax):    1.27 cm AV Area (Vmean):   1.23 cm AV Area (VTI):     1.26 cm AV Vmax:           320.67 cm/s AV Vmean:          222.000 cm/s AV VTI:            0.559 m AV Peak Grad:      41.1 mmHg AV Mean Grad:      23.3 mmHg LVOT Vmax:         107.00 cm/s LVOT Vmean:        71.800 cm/s LVOT VTI:          0.186 m LVOT/AV VTI ratio: 0.33 AI PHT:            319 msec  AORTA Ao Root diam: 4.20 cm Ao Asc diam:  4.05 cm MITRAL VALVE             TRICUSPID VALVE MV Peak grad: 25.6 mmHg  TR Peak grad:   49.3 mmHg MV Mean grad: 18.0 mmHg  TR Vmax:        351.00 cm/s MV Vmax:      2.53 m/s MV Vmean:     204.5 cm/s SHUNTS                          Systemic VTI:  0.19 m                          Systemic Diam: 2.20 cm Rachelle Hora Croitoru MD Electronically signed by Thurmon Fair MD Signature Date/Time: 02/13/2023/2:17:28 PM    Final    DG CHEST PORT 1 VIEW  Result Date: 02/12/2023 CLINICAL DATA:  CHF. EXAM: PORTABLE CHEST 1 VIEW COMPARISON:  12/27/2022 FINDINGS: Right internal jugular catheter tip overlies the atrial caval junction. Heart size upper normal, prosthetic cardiac valve. Post median sternotomy. Worsening pulmonary edema. Suspected bilateral pleural effusions, left greater than  right. No pneumothorax. IMPRESSION: Worsening pulmonary edema. Suspected bilateral pleural effusions, left greater than right. Electronically Signed   By: Narda Rutherford M.D.   On: 02/12/2023 23:55    Cardiac Studies  TTE 02/13/2023  1. There is no left ventricular thrombus (Definity contrast was used).  There is severe septal-lateral dyssynchrony due to LBBB. Left ventricular  ejection fraction, by estimation, is 35 to 40%. The left ventricle has  moderately decreased function. The  left ventricle demonstrates global hypokinesis. The left ventricular  internal cavity size was mildly dilated. There is mild concentric left  ventricular hypertrophy. Indeterminate diastolic filling due to E-A  fusion.   2. Right ventricular systolic function is moderately reduced. The right  ventricular size is moderately enlarged. There is moderately elevated  pulmonary artery systolic pressure. The estimated right ventricular  systolic pressure is 57.3 mmHg.   3. Left atrial size was severely dilated.   4. Right atrial size was moderately dilated.   5. The mitral valve has been repaired/replaced. No evidence  of mitral  valve regurgitation. Severe mitral stenosis. The mean mitral valve  gradient is 18.0 mmHg with average heart rate of 95 bpm. There is a 31 mm  Edwards Sapien bioprosthetic valve present   in the mitral position. Procedure Date: 09/05/2015. Echo findings are  consistent with stenosis the mitral prosthesis.   6. Large pleural effusion.   7. Tricuspid valve regurgitation is moderate to severe.   8. The aortic valve is tricuspid. There is moderate calcification of the  aortic valve. There is moderate thickening of the aortic valve. Aortic  valve regurgitation is moderate. Moderate aortic valve stenosis. Aortic  regurgitation PHT measures 319 msec.  Aortic valve mean gradient measures 23.3 mmHg. Aortic valve Vmax measures  3.21 m/s.   9. Aortic dilatation noted. There is mild dilatation of the  ascending  aorta, measuring 39 mm. There is mild dilatation of the aortic root,  measuring 42 mm.  10. Hepatic vein flow reversal is seen is seen intermittently (likely with  respiration, spirometry was not performed). The inferior vena cava is  dilated in size with >50% respiratory variability, suggesting right atrial  pressure of 8 mmHg.   Left heart catheterization 2016 at [facility] -PCI to left circumflex and RCA, 50% distal left main, 90% proximal LAD, 70% ISR proximal LAD, patent LIMA to LAD  Patient Profile  Dominic [name] is a 72 y.o. 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  Course complicated by acute systolic heart failure and volume overload.  Also likely non-STEMI which is secondary.   Assessment & Plan   # Syncope # Concern for symptomatic bradycardia -Admitted with several syncopal episodes.  On amiodarone as well as metoprolol.  Has a left bundle branch block with first-degree block.  Holding beta-blocker. -Suspect these episodes were bradycardia mediated.  Evaluated by EP.  Status post CRT-D removal.  Not a candidate for transvenous system.  Likely planning for leadless RV pacemaker prior to discharge.  # Acute systolic heart failure, EF 35% # LV dyssynchrony, left bundle branch block # Endocarditis status post CRT-D removal # ESRD -EF is worsened likely related to left bundle branch block.  Has significant dyssynchrony on echo. -Symptoms improved with volume removal per dialysis.  Would continue with this likely tomorrow. -Plan for left and right heart catheterization to define his coronary anatomy and volume status.  Risk and benefits explained.  He is willing to proceed. -Cannot use beta-blockers as he has no backup pacing.  Continue hydralazine 10 mg 3 times daily and Imdur 30 mg daily.  Not  a candidate for ACE/ARB/ARNI/MRA given ESRD. -Suspect his EF drop is related to not having CRT anymore.  This is not related to mitral valve stenosis.  # Non-STEMI # CAD status post CABG and PCI -Most recent cath in 2016 with PCI to circumflex and RCA.  Commented that his LIMA to LAD is patent.  No other comments on vein grafts. -Admitted with elevated troponin likely related to demand. -Plan for left and right heart catheterization today.  Continue heparin drip. -On aspirin and statin.  # Severe stenosis of bioprosthetic mitral valve -Mean gradient 18 mmHg.  This is not contributing to his reduced LVEF but likely going to make dialysis tough. -He will ultimately need to be evaluated for transcatheter mitral valve replacement but given occluded AV fistula and indwelling dialysis  catheter he is not currently a candidate. -We will do workup for this while here.  Plan for left and right heart catheterization today.  He will need a transesophageal echo which will be done tomorrow.  Risk and benefits of that procedure discussed.  Willing to proceed tomorrow.  # Aortic stenosis, moderate -Moderate on echo.  # Paroxysmal atrial fibrillation -No beta-blocker due to conduction disease.  Continue Amio.  On heparin drip.  Transition back to Eliquis as we are able.  # ESRD -Per nephrology.  Has an occluded AV fistula.  With indwelling HD catheter.  This will need to be addressed prior to any transcatheter procedures.  # Severe COPD -Home inhalers  # Type 2 diabetes -Lantus reduced to 20 mg twice daily.  Continue sliding scale insulin.  # FEN -No intravenous fluids -N.p.o. -Code: Full -Disposition: Left and right heart catheterization today.  Transesophageal echo tomorrow.  Leadless RV pacemaker on Friday.  For questions or updates, please contact [facility] Please consult [email or web address] for contact info under        Signed, Gerri Spore T. Flora Lipps, MD, [facility] [place]  [facility]  02/14/2023 9:30 AM

## 2023-02-14 NOTE — Progress Notes (Signed)
ANTICOAGULATION CONSULT NOTE  Pharmacy Consult for heparin Indication: atrial fibrillation  Allergies  Allergen Reactions   Hydrochlorothiazide Itching and Other (See Comments)    Unknown. Hyponatremia   Amoxicillin-Pot Clavulanate Nausea And Vomiting   Clindamycin Nausea And Vomiting and Other (See Comments)    Chest pain   Lisinopril Other (See Comments) and Cough    hyponatremia    Meloxicam Itching and Nausea Only   Topiramate Other (See Comments)   Tamsulosin Other (See Comments)    dysuria     Patient Measurements: Height: 5\' 10"  (177.8 cm) Weight: 113.8 kg (250 lb 14.1 oz) IBW/kg (Calculated) : 73 Heparin Dosing Weight: 100 kg  Vital Signs: Temp: 98.6 F (37 C) (09/04 0734) Temp Source: Oral (09/04 0734) BP: 103/63 (09/04 0734) Pulse Rate: 90 (09/04 0734)  Labs: Recent Labs    02/12/23 0003 02/13/23 0002 02/13/23 1017 02/13/23 1742 02/14/23 0221 02/14/23 0617  HGB 10.6*  --  10.9*  --  10.4*  --   HCT 33.2*  --  34.2*  --  31.7*  --   PLT 179  --  176  --  171  --   APTT 33  --   --  92*  --  110*  LABPROT 15.1  --   --   --   --   --   INR 1.2  --   --   --   --   --   HEPARINUNFRC  --   --   --  0.72*  --   --   CREATININE 3.26*  --  3.03*  --  2.38*  --   TROPONINIHS 1,099* 1,089*  --   --   --   --     Estimated Creatinine Clearance: 36 mL/min (A) (by C-G formula based on SCr of 2.38 mg/dL (H)).   Medical History: Past Medical History:  Diagnosis Date   Biventricular ICD (implantable cardioverter-defibrillator) - [facility] 09/05/2015   Jul 01, 2015 [facility] Dynagen X4   CAD (coronary artery disease) 09/05/2015   CABG 2015 PCI-BMS ostial LCX Sept 2015 04/05/2015 cath 50% distal LM, 90% prox LAD, old stent prox LAD 70% ISR, patent LIMA-LAD, 80% prox RCA PCI-DES x2 RCA and LCX    Chronic systolic CHF (congestive heart failure) (HCC) 09/05/2015   a. RHC 10/29/15: PCW 8, CI 2.2. Overall low filling presssures. Mean PA 20; b. echo 10/30/15: EF 45-50%, not tech  suff to allwo for LV diastolic fxn, mild AI, nl appearing MVR   CKD stage 4 due to type 2 diabetes mellitus (HCC) 09/05/2015   Diabetes mellitus, type 2 (HCC) 09/05/2015   Essential hypertension 09/05/2015   History of mitral valve replacement with bioprosthetic valve 09/05/2015   31 mm Edwards 2015, Dr. Arvilla Market   Postoperative atrial fibrillation (HCC) 09/05/2015   Assessment: 33 yoM presented to [facility] for evaluation of syncope and bradycardia from [facility]. Relevant PMH includes CAD s/p CABG/bMVR (2015) & multiple PCIs (most recent 2016), ICM w/ EF recovery (EF = 45-50%), staph IE s/p ICD extraction (2023), PAF (on Eliquis), and ESRD on HD (TTS). Pharmacy has been consulted to dose heparin for afib.  Last dose of eliquis given on 9/02 PM PTA.  anti-Xa 0.72 remains falsely elevated. aPTT slightly above goal on 1500 units/hr. No issues with heparin infusion or signs of bleeding reported. CBC stable, plts  171.   Goal of Therapy:  Heparin level 0.3-0.7 units/ml aPTT 66-102 seconds Monitor platelets by anticoagulation protocol: Yes  Plan:  Decrease heparin infusion at 1300 units/hr Check heparin level in 8 hours and daily while on heparin Continue to monitor H&H and platelets F/u plans for PPM   Thank you for allowing pharmacy to be a part of this patient's care.  Thelma Barge, PharmD Clinical Pharmacist

## 2023-02-14 NOTE — H&P (View-Only) (Signed)
Cardiology Progress Note  Patient ID: Dominic [name] MRN: [id number] DOB: 1950-08-05 Date of Encounter: 02/14/2023  Primary Cardiologist: Thurmon Fair, MD  Subjective   Chief Complaint: SOB  HPI: Left heart catheterization today.  LVEF is reduced at 35%.  Severe stenosis of prosthetic mitral valve.  Net -2.1 L with dialysis.  ROS:  All other ROS reviewed and negative. Pertinent positives noted in the HPI.     Inpatient Medications  Scheduled Meds:  allopurinol  100 mg Oral Daily   amiodarone  200 mg Oral Daily   aspirin EC  81 mg Oral Daily   atorvastatin  40 mg Oral Daily   Chlorhexidine Gluconate Cloth  6 each Topical Daily   Chlorhexidine Gluconate Cloth  6 each Topical Q0600   cholecalciferol  2,000 Units Oral Daily   DULoxetine  60 mg Oral Daily   famotidine  10 mg Oral Daily   ferrous sulfate  325 mg Oral Q breakfast   hydrALAZINE  10 mg Oral TID   insulin aspart  0-9 Units Subcutaneous TID WC   insulin glargine-yfgn  10 Units Subcutaneous BID   isosorbide mononitrate  60 mg Oral Daily   [START ON 02/15/2023] midodrine  10 mg Oral Q T,Th,Sa-HD   mometasone-formoterol  2 puff Inhalation BID   nicotine  14 mg Transdermal Daily   sodium chloride flush  3 mL Intravenous Q12H   sucralfate  1 g Oral BID   Continuous Infusions:  sodium chloride     sodium chloride 10 mL/hr at 02/14/23 0610   heparin 1,300 Units/hr (02/14/23 0846)   PRN Meds: sodium chloride, acetaminophen, albuterol, fluticasone, midodrine, ondansetron (ZOFRAN) IV, sodium chloride flush   Vital Signs   Vitals:   02/14/23 0500 02/14/23 0734 02/14/23 0800 02/14/23 0918  BP:  103/63 109/63   Pulse: 92 90 89 90  Resp: 18 16 16 18   Temp:  98.6 F (37 C)    TempSrc:  Oral    SpO2: (!) 88% 91% 91% 92%  Weight: 113.8 kg     Height:        Intake/Output Summary (Last 24 hours) at 02/14/2023 0930 Last data filed at 02/14/2023 0500 Gross per 24 hour  Intake 506.17 ml  Output 2700 ml  Net -2193.83 ml       02/14/2023    5:00 AM 02/13/2023    4:24 PM 02/13/2023   12:35 PM  Last 3 Weights  Weight (lbs) 250 lb 14.1 oz 254 lb 13.6 oz 261 lb 0.4 oz  Weight (kg) 113.8 kg 115.6 kg 118.4 kg      Telemetry  Overnight telemetry shows sinus rhythm 80s, which I personally reviewed.   Physical Exam   Vitals:   02/14/23 0500 02/14/23 0734 02/14/23 0800 02/14/23 0918  BP:  103/63 109/63   Pulse: 92 90 89 90  Resp: 18 16 16 18   Temp:  98.6 F (37 C)    TempSrc:  Oral    SpO2: (!) 88% 91% 91% 92%  Weight: 113.8 kg     Height:        Intake/Output Summary (Last 24 hours) at 02/14/2023 0930 Last data filed at 02/14/2023 0500 Gross per 24 hour  Intake 506.17 ml  Output 2700 ml  Net -2193.83 ml       02/14/2023    5:00 AM 02/13/2023    4:24 PM 02/13/2023   12:35 PM  Last 3 Weights  Weight (lbs) 250 lb 14.1 oz 254 lb 13.6 oz 261  lb 0.4 oz  Weight (kg) 113.8 kg 115.6 kg 118.4 kg    Body mass index is 36 kg/m.  General: Well nourished, well developed, in no acute distress Head: Atraumatic, normal size  Eyes: PEERLA, EOMI  Neck: Supple, JVD 8 to 10 cm of water Endocrine: No thryomegaly Cardiac: Normal S1, S2; RRR; 2 out of 6 systolic ejection murmur, diastolic rumble Lungs: Clear to auscultation bilaterally, no wheezing, rhonchi or rales  Abd: Soft, nontender, no hepatomegaly  Ext: No edema, pulses 2+ Musculoskeletal: No deformities, BUE and BLE strength normal and equal Skin: Warm and dry, no rashes   Neuro: Alert and oriented to person, place, time, and situation, CNII-XII grossly intact, no focal deficits  Psych: Normal mood and affect   Labs  High Sensitivity Troponin:   Recent Labs  Lab 02/12/23 0003 02/13/23 0002  TROPONINIHS 1,099* 1,089*     Cardiac EnzymesNo results for input(s): "TROPONINI" in the last 168 hours. No results for input(s): "TROPIPOC" in the last 168 hours.  Chemistry Recent Labs  Lab 02/12/23 0003 02/13/23 1017 02/14/23 0221  NA 134* 136 134*  K 4.1 4.0  3.8  CL 105 103 98  CO2 20* 20* 22  GLUCOSE 84 134* 94  BUN 50* 50* 34*  CREATININE 3.26* 3.03* 2.38*  CALCIUM 8.3* 8.5* 8.3*  PROT 6.3*  --   --   ALBUMIN 2.2*  --   --   AST 33  --   --   ALT 26  --   --   ALKPHOS 87  --   --   BILITOT 0.6  --   --   GFRNONAA 19* 21* 28*  ANIONGAP 9 13 14     Hematology Recent Labs  Lab 02/12/23 0003 02/13/23 1017 02/14/23 0221  WBC 7.0 6.8 5.0  RBC 3.61*  3.63* 3.83* 3.52*  HGB 10.6* 10.9* 10.4*  HCT 33.2* 34.2* 31.7*  MCV 92.0 89.3 90.1  MCH 29.4 28.5 29.5  MCHC 31.9 31.9 32.8  RDW 18.5* 18.3* 18.3*  PLT 179 176 171   BNP Recent Labs  Lab 02/12/23 0005  BNP 1,051.6*    DDimer No results for input(s): "DDIMER" in the last 168 hours.   Radiology  ECHOCARDIOGRAM COMPLETE  Result Date: 02/13/2023    ECHOCARDIOGRAM REPORT   Patient Name:   Dominic [name] Date of Exam: 02/13/2023 Medical Rec #:  [id number]      Height:       70.0 in Accession #:    [id number]     Weight:       258.8 lb Date of Birth:  11-21-1950      BSA:          2.329 m Patient Age:    72 years       BP:           124/76 mmHg Patient Gender: M              HR:           94 bpm. Exam Location:  Inpatient Procedure: 2D Echo, Color Doppler, Cardiac Doppler and Intracardiac            Opacification Agent Indications:    I44.7 LBBB  History:        Patient has prior history of Echocardiogram examinations, most                 recent 01/23/2022. CHF, Abnormal ECG, Defibrillator and Prior  CABG, COPD, Endocarditis, Mitral Stenosis, Mitral Valve Disease                 and Aortic Valve Disease, Arrythmias:LBBB,                 Signs/Symptoms:Syncope; Risk Factors:Current Smoker and                 Hypertension. ESRD. MVR.                  Mitral Valve: 31 mm Edwards Sapien bioprosthetic valve valve is                 present in the mitral position. Procedure Date: 09/05/2015.  Sonographer:    Sheralyn Boatman RDCS Referring Phys: [id number] [facility]  Sonographer Comments:  Technically difficult study due to poor echo windows. IMPRESSIONS  1. There is no left ventricular thrombus (Definity contrast was used). There is severe septal-lateral dyssynchrony due to LBBB. Left ventricular ejection fraction, by estimation, is 35 to 40%. The left ventricle has moderately decreased function. The left ventricle demonstrates global hypokinesis. The left ventricular internal cavity size was mildly dilated. There is mild concentric left ventricular hypertrophy. Indeterminate diastolic filling due to E-A fusion.  2. Right ventricular systolic function is moderately reduced. The right ventricular size is moderately enlarged. There is moderately elevated pulmonary artery systolic pressure. The estimated right ventricular systolic pressure is 57.3 mmHg.  3. Left atrial size was severely dilated.  4. Right atrial size was moderately dilated.  5. The mitral valve has been repaired/replaced. No evidence of mitral valve regurgitation. Severe mitral stenosis. The mean mitral valve gradient is 18.0 mmHg with average heart rate of 95 bpm. There is a 31 mm Edwards Sapien bioprosthetic valve present  in the mitral position. Procedure Date: 09/05/2015. Echo findings are consistent with stenosis the mitral prosthesis.  6. Large pleural effusion.  7. Tricuspid valve regurgitation is moderate to severe.  8. The aortic valve is tricuspid. There is moderate calcification of the aortic valve. There is moderate thickening of the aortic valve. Aortic valve regurgitation is moderate. Moderate aortic valve stenosis. Aortic regurgitation PHT measures 319 msec. Aortic valve mean gradient measures 23.3 mmHg. Aortic valve Vmax measures 3.21 m/s.  9. Aortic dilatation noted. There is mild dilatation of the ascending aorta, measuring 39 mm. There is mild dilatation of the aortic root, measuring 42 mm. 10. Hepatic vein flow reversal is seen is seen intermittently (likely with respiration, spirometry was not performed). The  inferior vena cava is dilated in size with >50% respiratory variability, suggesting right atrial pressure of 8 mmHg. Comparison(s): The left ventricular function is worsened. There is marked increase in mitral valve prosthesis gradients, which may be partly related to the increase in heart rate, but could also be due to prosthetic valve endocarditis. Pulmonary artery hypertension is worse, likley due to increase left heart pressures. Aortic stenosis is only slightly worse. 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 Consider TEE for evaluation for endocarditis of the mitral valve prosthesis. Consider a component of constrictive physiology (evaluate mitral flow and hepatic vein flow with spirometry). FINDINGS  Left Ventricle: There is no left ventricular thrombus (Definity contrast was used). There is severe septal-lateral dyssynchrony due to LBBB. Left ventricular ejection fraction, by estimation, is 35 to 40%. The left ventricle has moderately decreased function. The left ventricle demonstrates global hypokinesis. Definity contrast agent was given IV to delineate the left ventricular endocardial borders. The left ventricular  internal cavity size was mildly dilated. There is mild concentric left ventricular hypertrophy. Abnormal (paradoxical) septal motion, consistent with left bundle branch block. Indeterminate diastolic filling due to E-A fusion. Right Ventricle: The right ventricular size is moderately enlarged. Right vetricular wall thickness was not well visualized. Right ventricular systolic function is moderately reduced. There is moderately elevated pulmonary artery systolic pressure. The tricuspid regurgitant velocity is 3.51 m/s, and with an assumed right atrial pressure of 8 mmHg, the estimated right ventricular systolic pressure is 57.3 mmHg. Left Atrium: Left atrial size was severely dilated. Right Atrium: Right atrial size was moderately dilated.  Pericardium: There is no evidence of pericardial effusion. Mitral Valve: The mitral valve has been repaired/replaced. No evidence of mitral valve regurgitation. There is a 31 mm Edwards Sapien bioprosthetic valve present in the mitral position. Procedure Date: 09/05/2015. 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 Severe mitral valve stenosis. MV peak gradient, 25.6 mmHg. The mean mitral valve gradient is 18.0 mmHg with average heart rate of 95 bpm. Tricuspid Valve: The tricuspid valve is grossly normal. Tricuspid valve regurgitation is moderate to severe. No evidence of tricuspid stenosis. Aortic Valve: The aortic valve is tricuspid. There is moderate calcification of the aortic valve. There is moderate thickening of the aortic valve. Aortic valve regurgitation is moderate. Aortic regurgitation PHT measures 319 msec. Moderate aortic stenosis is present. Aortic valve mean gradient measures 23.3 mmHg. Aortic valve peak gradient measures 41.1 mmHg. Aortic valve area, by VTI measures 1.26 cm. Pulmonic Valve: The pulmonic valve was grossly normal. Pulmonic valve regurgitation is not visualized. No evidence of pulmonic stenosis. Aorta: Aortic dilatation noted. There is mild dilatation of the ascending aorta, measuring 39 mm. There is mild dilatation of the aortic root, measuring 42 mm. Venous: Hepatic vein flow reversal is seen is seen intermittently (likely with respiration, spirometry was not performed). The inferior vena cava is dilated in size with greater than 50% respiratory variability, suggesting right atrial pressure of 8 mmHg. The inferior vena cava and the hepatic vein show a pattern of systolic flow reversal, suggestive of tricuspid regurgitation. IAS/Shunts: No atrial level shunt detected by color flow Doppler. Additional Comments: There is a large pleural effusion.  LEFT VENTRICLE PLAX 2D LVIDd:         6.00 cm      Diastology LVIDs:         4.80 cm      LV e' medial:  3.48 cm/s LV  PW:         1.30 cm      LV e' lateral: 7.40 cm/s LV IVS:        1.40 cm LVOT diam:     2.20 cm LV SV:         71 LV SV Index:   30 LVOT Area:     3.80 cm  LV Volumes (MOD) LV vol d, MOD A2C: 142.5 ml LV vol d, MOD A4C: 162.0 ml LV vol s, MOD A2C: 87.3 ml LV vol s, MOD A4C: 101.4 ml LV SV MOD A2C:     55.2 ml LV SV MOD A4C:     162.0 ml LV SV MOD BP:      57.0 ml RIGHT VENTRICLE            IVC RV S prime:     6.31 cm/s  IVC diam: 2.70 cm TAPSE (M-mode): 1.0 cm LEFT ATRIUM             Index  RIGHT ATRIUM           Index LA diam:        5.20 cm 2.23 cm/m   RA Area:     16.40 cm LA Vol (A2C):   53.9 ml 23.14 ml/m  RA Volume:   43.90 ml  18.85 ml/m LA Vol (A4C):   70.0 ml 30.06 ml/m LA Biplane Vol: 65.2 ml 28.00 ml/m  AORTIC VALVE AV Area (Vmax):    1.27 cm AV Area (Vmean):   1.23 cm AV Area (VTI):     1.26 cm AV Vmax:           320.67 cm/s AV Vmean:          222.000 cm/s AV VTI:            0.559 m AV Peak Grad:      41.1 mmHg AV Mean Grad:      23.3 mmHg LVOT Vmax:         107.00 cm/s LVOT Vmean:        71.800 cm/s LVOT VTI:          0.186 m LVOT/AV VTI ratio: 0.33 AI PHT:            319 msec  AORTA Ao Root diam: 4.20 cm Ao Asc diam:  4.05 cm MITRAL VALVE             TRICUSPID VALVE MV Peak grad: 25.6 mmHg  TR Peak grad:   49.3 mmHg MV Mean grad: 18.0 mmHg  TR Vmax:        351.00 cm/s MV Vmax:      2.53 m/s MV Vmean:     204.5 cm/s SHUNTS                          Systemic VTI:  0.19 m                          Systemic Diam: 2.20 cm Rachelle Hora Croitoru MD Electronically signed by Thurmon Fair MD Signature Date/Time: 02/13/2023/2:17:28 PM    Final    DG CHEST PORT 1 VIEW  Result Date: 02/12/2023 CLINICAL DATA:  CHF. EXAM: PORTABLE CHEST 1 VIEW COMPARISON:  12/27/2022 FINDINGS: Right internal jugular catheter tip overlies the atrial caval junction. Heart size upper normal, prosthetic cardiac valve. Post median sternotomy. Worsening pulmonary edema. Suspected bilateral pleural effusions, left greater than  right. No pneumothorax. IMPRESSION: Worsening pulmonary edema. Suspected bilateral pleural effusions, left greater than right. Electronically Signed   By: Narda Rutherford M.D.   On: 02/12/2023 23:55    Cardiac Studies  TTE 02/13/2023  1. There is no left ventricular thrombus (Definity contrast was used).  There is severe septal-lateral dyssynchrony due to LBBB. Left ventricular  ejection fraction, by estimation, is 35 to 40%. The left ventricle has  moderately decreased function. The  left ventricle demonstrates global hypokinesis. The left ventricular  internal cavity size was mildly dilated. There is mild concentric left  ventricular hypertrophy. Indeterminate diastolic filling due to E-A  fusion.   2. Right ventricular systolic function is moderately reduced. The right  ventricular size is moderately enlarged. There is moderately elevated  pulmonary artery systolic pressure. The estimated right ventricular  systolic pressure is 57.3 mmHg.   3. Left atrial size was severely dilated.   4. Right atrial size was moderately dilated.   5. The mitral valve has been repaired/replaced. No evidence  of mitral  valve regurgitation. Severe mitral stenosis. The mean mitral valve  gradient is 18.0 mmHg with average heart rate of 95 bpm. There is a 31 mm  Edwards Sapien bioprosthetic valve present   in the mitral position. Procedure Date: 09/05/2015. Echo findings are  consistent with stenosis the mitral prosthesis.   6. Large pleural effusion.   7. Tricuspid valve regurgitation is moderate to severe.   8. The aortic valve is tricuspid. There is moderate calcification of the  aortic valve. There is moderate thickening of the aortic valve. Aortic  valve regurgitation is moderate. Moderate aortic valve stenosis. Aortic  regurgitation PHT measures 319 msec.  Aortic valve mean gradient measures 23.3 mmHg. Aortic valve Vmax measures  3.21 m/s.   9. Aortic dilatation noted. There is mild dilatation of the  ascending  aorta, measuring 39 mm. There is mild dilatation of the aortic root,  measuring 42 mm.  10. Hepatic vein flow reversal is seen is seen intermittently (likely with  respiration, spirometry was not performed). The inferior vena cava is  dilated in size with >50% respiratory variability, suggesting right atrial  pressure of 8 mmHg.   Left heart catheterization 2016 at [facility] -PCI to left circumflex and RCA, 50% distal left main, 90% proximal LAD, 70% ISR proximal LAD, patent LIMA to LAD  Patient Profile  Dominic [name] is a 72 y.o. 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  Course complicated by acute systolic heart failure and volume overload.  Also likely non-STEMI which is secondary.   Assessment & Plan   # Syncope # Concern for symptomatic bradycardia -Admitted with several syncopal episodes.  On amiodarone as well as metoprolol.  Has a left bundle branch block with first-degree block.  Holding beta-blocker. -Suspect these episodes were bradycardia mediated.  Evaluated by EP.  Status post CRT-D removal.  Not a candidate for transvenous system.  Likely planning for leadless RV pacemaker prior to discharge.  # Acute systolic heart failure, EF 35% # LV dyssynchrony, left bundle branch block # Endocarditis status post CRT-D removal # ESRD -EF is worsened likely related to left bundle branch block.  Has significant dyssynchrony on echo. -Symptoms improved with volume removal per dialysis.  Would continue with this likely tomorrow. -Plan for left and right heart catheterization to define his coronary anatomy and volume status.  Risk and benefits explained.  He is willing to proceed. -Cannot use beta-blockers as he has no backup pacing.  Continue hydralazine 10 mg 3 times daily and Imdur 30 mg daily.  Not  a candidate for ACE/ARB/ARNI/MRA given ESRD. -Suspect his EF drop is related to not having CRT anymore.  This is not related to mitral valve stenosis.  # Non-STEMI # CAD status post CABG and PCI -Most recent cath in 2016 with PCI to circumflex and RCA.  Commented that his LIMA to LAD is patent.  No other comments on vein grafts. -Admitted with elevated troponin likely related to demand. -Plan for left and right heart catheterization today.  Continue heparin drip. -On aspirin and statin.  # Severe stenosis of bioprosthetic mitral valve -Mean gradient 18 mmHg.  This is not contributing to his reduced LVEF but likely going to make dialysis tough. -He will ultimately need to be evaluated for transcatheter mitral valve replacement but given occluded AV fistula and indwelling dialysis  catheter he is not currently a candidate. -We will do workup for this while here.  Plan for left and right heart catheterization today.  He will need a transesophageal echo which will be done tomorrow.  Risk and benefits of that procedure discussed.  Willing to proceed tomorrow.  # Aortic stenosis, moderate -Moderate on echo.  # Paroxysmal atrial fibrillation -No beta-blocker due to conduction disease.  Continue Amio.  On heparin drip.  Transition back to Eliquis as we are able.  # ESRD -Per nephrology.  Has an occluded AV fistula.  With indwelling HD catheter.  This will need to be addressed prior to any transcatheter procedures.  # Severe COPD -Home inhalers  # Type 2 diabetes -Lantus reduced to 20 mg twice daily.  Continue sliding scale insulin.  # FEN -No intravenous fluids -N.p.o. -Code: Full -Disposition: Left and right heart catheterization today.  Transesophageal echo tomorrow.  Leadless RV pacemaker on Friday.  For questions or updates, please contact [facility] Please consult [email or web address] for contact info under        Signed, Gerri Spore T. Flora Lipps, MD, [facility] [place]  [facility]  02/14/2023 9:30 AM

## 2023-02-14 NOTE — Progress Notes (Addendum)
[facility] Progress Note   Subjective:   Pt reports he was tired after dialysis but this is typical for him. Net UF was 2.1L. Reports he is on O2 3L at home, breathing seems to be around baseline. Denies CP, palpitations, dizziness ,nausea. Planned for L heart cath today.   Objective Vitals:   02/14/23 0500 02/14/23 0734 02/14/23 0800 02/14/23 0918  BP:  103/63 109/63   Pulse: 92 90 89 90  Resp: 18 16 16 18   Temp:  98.6 F (37 C)    TempSrc:  Oral    SpO2: (!) 88% 91% 91% 92%  Weight: 113.8 kg     Height:       Physical Exam General: Alert male in NAD Heart:RRR, no murmur Lungs: Rales in bases bilaterally. Respirations unlabored. No wheezing Abdomen: Soft, non-distended, +BS Extremities: No edema b/l lower extremities Dialysis Access:  R internal jugular [facility]  Additional Objective Labs: Basic Metabolic Panel: Recent Labs  Lab 02/12/23 0003 02/13/23 0159 02/13/23 1017 02/14/23 0221  NA 134*  --  136 134*  K 4.1  --  4.0 3.8  CL 105  --  103 98  CO2 20*  --  20* 22  GLUCOSE 84  --  134* 94  BUN 50*  --  50* 34*  CREATININE 3.26*  --  3.03* 2.38*  CALCIUM 8.3*  --  8.5* 8.3*  PHOS  --  3.7  --   --    Liver Function Tests: Recent Labs  Lab 02/12/23 0003  AST 33  ALT 26  ALKPHOS 87  BILITOT 0.6  PROT 6.3*  ALBUMIN 2.2*   No results for input(s): "LIPASE", "AMYLASE" in the last 168 hours. CBC: Recent Labs  Lab 02/12/23 0003 02/13/23 1017 02/14/23 0221  WBC 7.0 6.8 5.0  NEUTROABS 5.2  --   --   HGB 10.6* 10.9* 10.4*  HCT 33.2* 34.2* 31.7*  MCV 92.0 89.3 90.1  PLT 179 176 171   Blood Culture No results found for: "SDES", "SPECREQUEST", "CULT", "REPTSTATUS"  Cardiac Enzymes: No results for input(s): "CKTOTAL", "CKMB", "CKMBINDEX", "TROPONINI" in the last 168 hours. CBG: Recent Labs  Lab 02/13/23 1050 02/13/23 1714 02/13/23 2109 02/14/23 0555 02/14/23 0904  GLUCAP 111* 102* 93 109* 96   Iron Studies:  Recent Labs     02/12/23 0003  IRON 54  TIBC 202*  FERRITIN 320   @lablastinr3 @ Studies/Results: ECHOCARDIOGRAM COMPLETE  Result Date: 02/13/2023    ECHOCARDIOGRAM REPORT   Patient Name:   JANDRE SALDIERNA Date of Exam: 02/13/2023 Medical Rec #:  [id number]      Height:       70.0 in Accession #:    [id number]     Weight:       258.8 lb Date of Birth:  May 08, 1951      BSA:          2.329 m Patient Age:    72 years       BP:           124/76 mmHg Patient Gender: M              HR:           94 bpm. Exam Location:  Inpatient Procedure: 2D Echo, Color Doppler, Cardiac Doppler and Intracardiac            Opacification Agent Indications:    I44.7 LBBB  History:        Patient has prior history of Echocardiogram  examinations, most                 recent 01/23/2022. CHF, Abnormal ECG, Defibrillator and Prior                 CABG, COPD, Endocarditis, Mitral Stenosis, Mitral Valve Disease                 and Aortic Valve Disease, Arrythmias:LBBB,                 Signs/Symptoms:Syncope; Risk Factors:Current Smoker and                 Hypertension. ESRD. MVR.                  Mitral Valve: 31 mm Edwards Sapien bioprosthetic valve valve is                 present in the mitral position. Procedure Date: 09/05/2015.  Sonographer:    Sheralyn Boatman RDCS Referring Phys: [id number] [facility]  Sonographer Comments: Technically difficult study due to poor echo windows. IMPRESSIONS  1. There is no left ventricular thrombus (Definity contrast was used). There is severe septal-lateral dyssynchrony due to LBBB. Left ventricular ejection fraction, by estimation, is 35 to 40%. The left ventricle has moderately decreased function. The left ventricle demonstrates global hypokinesis. The left ventricular internal cavity size was mildly dilated. There is mild concentric left ventricular hypertrophy. Indeterminate diastolic filling due to E-A fusion.  2. Right ventricular systolic function is moderately reduced. The right ventricular size is moderately  enlarged. There is moderately elevated pulmonary artery systolic pressure. The estimated right ventricular systolic pressure is 57.3 mmHg.  3. Left atrial size was severely dilated.  4. Right atrial size was moderately dilated.  5. The mitral valve has been repaired/replaced. No evidence of mitral valve regurgitation. Severe mitral stenosis. The mean mitral valve gradient is 18.0 mmHg with average heart rate of 95 bpm. There is a 31 mm Edwards Sapien bioprosthetic valve present  in the mitral position. Procedure Date: 09/05/2015. Echo findings are consistent with stenosis the mitral prosthesis.  6. Large pleural effusion.  7. Tricuspid valve regurgitation is moderate to severe.  8. The aortic valve is tricuspid. There is moderate calcification of the aortic valve. There is moderate thickening of the aortic valve. Aortic valve regurgitation is moderate. Moderate aortic valve stenosis. Aortic regurgitation PHT measures 319 msec. Aortic valve mean gradient measures 23.3 mmHg. Aortic valve Vmax measures 3.21 m/s.  9. Aortic dilatation noted. There is mild dilatation of the ascending aorta, measuring 39 mm. There is mild dilatation of the aortic root, measuring 42 mm. 10. Hepatic vein flow reversal is seen is seen intermittently (likely with respiration, spirometry was not performed). The inferior vena cava is dilated in size with >50% respiratory variability, suggesting right atrial pressure of 8 mmHg. Comparison(s): The left ventricular function is worsened. There is marked increase in mitral valve prosthesis gradients, which may be partly related to the increase in heart rate, but could also be due to prosthetic valve endocarditis. Pulmonary artery hypertension is worse, likley due to increase left heart pressures. Aortic stenosis is only slightly worse. 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 Consider TEE for evaluation for endocarditis of the mitral valve  prosthesis. Consider a component of constrictive physiology (evaluate mitral flow and hepatic vein flow with spirometry). FINDINGS  Left Ventricle: There is no left ventricular thrombus (Definity contrast was used). There is severe septal-lateral dyssynchrony  due to LBBB. Left ventricular ejection fraction, by estimation, is 35 to 40%. The left ventricle has moderately decreased function. The left ventricle demonstrates global hypokinesis. Definity contrast agent was given IV to delineate the left ventricular endocardial borders. The left ventricular internal cavity size was mildly dilated. There is mild concentric left ventricular hypertrophy. Abnormal (paradoxical) septal motion, consistent with left bundle branch block. Indeterminate diastolic filling due to E-A fusion. Right Ventricle: The right ventricular size is moderately enlarged. Right vetricular wall thickness was not well visualized. Right ventricular systolic function is moderately reduced. There is moderately elevated pulmonary artery systolic pressure. The tricuspid regurgitant velocity is 3.51 m/s, and with an assumed right atrial pressure of 8 mmHg, the estimated right ventricular systolic pressure is 57.3 mmHg. Left Atrium: Left atrial size was severely dilated. Right Atrium: Right atrial size was moderately dilated. Pericardium: There is no evidence of pericardial effusion. Mitral Valve: The mitral valve has been repaired/replaced. No evidence of mitral valve regurgitation. There is a 31 mm Edwards Sapien bioprosthetic valve present in the mitral position. Procedure Date: 09/05/2015. 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 Severe mitral valve stenosis. MV peak gradient, 25.6 mmHg. The mean mitral valve gradient is 18.0 mmHg with average heart rate of 95 bpm. Tricuspid Valve: The tricuspid valve is grossly normal. Tricuspid valve regurgitation is moderate to severe. No evidence of tricuspid stenosis. Aortic Valve: The  aortic valve is tricuspid. There is moderate calcification of the aortic valve. There is moderate thickening of the aortic valve. Aortic valve regurgitation is moderate. Aortic regurgitation PHT measures 319 msec. Moderate aortic stenosis is present. Aortic valve mean gradient measures 23.3 mmHg. Aortic valve peak gradient measures 41.1 mmHg. Aortic valve area, by VTI measures 1.26 cm. Pulmonic Valve: The pulmonic valve was grossly normal. Pulmonic valve regurgitation is not visualized. No evidence of pulmonic stenosis. Aorta: Aortic dilatation noted. There is mild dilatation of the ascending aorta, measuring 39 mm. There is mild dilatation of the aortic root, measuring 42 mm. Venous: Hepatic vein flow reversal is seen is seen intermittently (likely with respiration, spirometry was not performed). The inferior vena cava is dilated in size with greater than 50% respiratory variability, suggesting right atrial pressure of 8 mmHg. The inferior vena cava and the hepatic vein show a pattern of systolic flow reversal, suggestive of tricuspid regurgitation. IAS/Shunts: No atrial level shunt detected by color flow Doppler. Additional Comments: There is a large pleural effusion.  LEFT VENTRICLE PLAX 2D LVIDd:         6.00 cm      Diastology LVIDs:         4.80 cm      LV e' medial:  3.48 cm/s LV PW:         1.30 cm      LV e' lateral: 7.40 cm/s LV IVS:        1.40 cm LVOT diam:     2.20 cm LV SV:         71 LV SV Index:   30 LVOT Area:     3.80 cm  LV Volumes (MOD) LV vol d, MOD A2C: 142.5 ml LV vol d, MOD A4C: 162.0 ml LV vol s, MOD A2C: 87.3 ml LV vol s, MOD A4C: 101.4 ml LV SV MOD A2C:     55.2 ml LV SV MOD A4C:     162.0 ml LV SV MOD BP:      57.0 ml RIGHT VENTRICLE  IVC RV S prime:     6.31 cm/s  IVC diam: 2.70 cm TAPSE (M-mode): 1.0 cm LEFT ATRIUM             Index        RIGHT ATRIUM           Index LA diam:        5.20 cm 2.23 cm/m   RA Area:     16.40 cm LA Vol (A2C):   53.9 ml 23.14 ml/m  RA Volume:    43.90 ml  18.85 ml/m LA Vol (A4C):   70.0 ml 30.06 ml/m LA Biplane Vol: 65.2 ml 28.00 ml/m  AORTIC VALVE AV Area (Vmax):    1.27 cm AV Area (Vmean):   1.23 cm AV Area (VTI):     1.26 cm AV Vmax:           320.67 cm/s AV Vmean:          222.000 cm/s AV VTI:            0.559 m AV Peak Grad:      41.1 mmHg AV Mean Grad:      23.3 mmHg LVOT Vmax:         107.00 cm/s LVOT Vmean:        71.800 cm/s LVOT VTI:          0.186 m LVOT/AV VTI ratio: 0.33 AI PHT:            319 msec  AORTA Ao Root diam: 4.20 cm Ao Asc diam:  4.05 cm MITRAL VALVE             TRICUSPID VALVE MV Peak grad: 25.6 mmHg  TR Peak grad:   49.3 mmHg MV Mean grad: 18.0 mmHg  TR Vmax:        351.00 cm/s MV Vmax:      2.53 m/s MV Vmean:     204.5 cm/s SHUNTS                          Systemic VTI:  0.19 m                          Systemic Diam: 2.20 cm Rachelle Hora Croitoru MD Electronically signed by Thurmon Fair MD Signature Date/Time: 02/13/2023/2:17:28 PM    Final    DG CHEST PORT 1 VIEW  Result Date: 02/12/2023 CLINICAL DATA:  CHF. EXAM: PORTABLE CHEST 1 VIEW COMPARISON:  12/27/2022 FINDINGS: Right internal jugular catheter tip overlies the atrial caval junction. Heart size upper normal, prosthetic cardiac valve. Post median sternotomy. Worsening pulmonary edema. Suspected bilateral pleural effusions, left greater than right. No pneumothorax. IMPRESSION: Worsening pulmonary edema. Suspected bilateral pleural effusions, left greater than right. Electronically Signed   By: Narda Rutherford M.D.   On: 02/12/2023 23:55   Medications:  sodium chloride     sodium chloride 10 mL/hr at 02/14/23 0610   heparin 1,300 Units/hr (02/14/23 1029)    allopurinol  100 mg Oral Daily   amiodarone  200 mg Oral Daily   aspirin EC  81 mg Oral Daily   atorvastatin  40 mg Oral Daily   Chlorhexidine Gluconate Cloth  6 each Topical Daily   Chlorhexidine Gluconate Cloth  6 each Topical Q0600   cholecalciferol  2,000 Units Oral Daily   DULoxetine  60 mg Oral Daily    famotidine  10 mg Oral Daily   ferrous  sulfate  325 mg Oral Q breakfast   hydrALAZINE  10 mg Oral TID   insulin aspart  0-6 Units Subcutaneous Q4H   [START ON 02/15/2023] isosorbide mononitrate  30 mg Oral Daily   [START ON 02/15/2023] midodrine  10 mg Oral Q T,Th,Sa-HD   mometasone-formoterol  2 puff Inhalation BID   nicotine  14 mg Transdermal Daily   sodium chloride flush  3 mL Intravenous Q12H   sucralfate  1 g Oral BID    Dialysis Orders: [facility] TTS Dr Lequita Halt  3.5h 112kg   300/600   3K/2.5Ca bath  TDC   Heparin none  Assessment/Plan: 1.Syncope/ bradycardia - cardiology consulting. EF 35% with severe stenosis of prosthetic mitral valve. They are planning a heart cath today.  2.Acute on chronic hypoxic resp failure - w/ pulm edema by CXR. BP was soft at the start of HD yesterday UF goal was reduced to 2.5L. Still some crackles on exam, further UF tomorrow as tolerated. Pt also with COPD and is on home O2.  3.ESRD - on HD TTS. Has not missed HD lately, Continue TTS schedule 4.HTN/ volume - BP's are borderline low. Holding home HTN meds and nitrates. Volume as above.  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 Not currently on a binder but is NPO for procedure anyway. If phos trends up, will start binder  7.NSTEMI - possible heart cath tomorrow.  8.PAF- bradycardia this admission, see above. Rate currently controlled.     Rogers Blocker, PA-C 02/14/2023, 10:55 AM  [facility] Pager: [phone]  Pt seen, examined and agree w A/P as above.  Vinson Moselle MD  [facility] 02/14/2023, 1:21 PM

## 2023-02-14 NOTE — TOC Initial Note (Signed)
Transition of Care [facility]) - Initial/Assessment Note    Patient Details  Name: Dominic [name] MRN: [id number] Date of Birth: 1951/01/31  [facility] CM/SW Contact:    Kingsley Plan, RN Phone Number: 02/14/2023, 12:43 PM  Clinical Narrative:                  Spoke to patient at bedside. Confirmed face sheet information. Patient from home with wife. Patient has a cane at home.  PCP Darlis Loan   Patient has insurance coverage.   TOC will continue to follow for discharge needs.  Expected Discharge Plan: Home/Self Care Barriers to Discharge: Continued Medical Work up   Patient Goals and CMS Choice Patient states their goals for this hospitalization and ongoing recovery are:: to return to home          Expected Discharge Plan and Services   Discharge Planning Services: CM Consult   Living arrangements for the past 2 months: Single Family Home                 DME Arranged: N/A         HH Arranged: NA          Prior Living Arrangements/Services Living arrangements for the past 2 months: Single Family Home Lives with:: Spouse Patient language and need for interpreter reviewed:: Yes Do you feel safe going back to the place where you live?: Yes      Need for Family Participation in Patient Care: Yes (Comment) Care giver support system in place?: Yes (comment) Current home services: DME Criminal Activity/Legal Involvement Pertinent to Current Situation/Hospitalization: No - Comment as needed  Activities of Daily Living Home Assistive Devices/Equipment: Cane (specify quad or straight) ADL Screening (condition at time of admission) Patient's cognitive ability adequate to safely complete daily activities?: Yes Is the patient deaf or have difficulty hearing?: No Does the patient have difficulty seeing, even when wearing glasses/contacts?: No Does the patient have difficulty concentrating, remembering, or making decisions?: No Patient able to express  need for assistance with ADLs?: Yes Does the patient have difficulty dressing or bathing?: No Independently performs ADLs?: Yes (appropriate for developmental age) Does the patient have difficulty walking or climbing stairs?: Yes Weakness of Legs: Both Weakness of Arms/Hands: None  Permission Sought/Granted   Permission granted to share information with : No              Emotional Assessment Appearance:: Appears stated age Attitude/Demeanor/Rapport: Engaged Affect (typically observed): Accepting Orientation: : Oriented to Self, Oriented to Place, Oriented to  Time, Oriented to Situation Alcohol / Substance Use: Not Applicable Psych Involvement: No (comment)  Admission diagnosis:  Symptomatic bradycardia [R00.1] Patient Active Problem List   Diagnosis Date Noted   Non-ST elevation (NSTEMI) myocardial infarction (HCC) 02/13/2023   Syncope and collapse 02/13/2023   Acute systolic heart failure (HCC) 02/13/2023   ESRD (end stage renal disease) (HCC) 02/13/2023   Volume overload 02/13/2023   Symptomatic bradycardia 02/12/2023   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   LBBB (left bundle branch block) 02/08/2022   Nonrheumatic aortic insufficiency with aortic stenosis 05/17/2021   Chronic diastolic heart failure (HCC) 05/17/2021   Encounter for monitoring amiodarone therapy 05/17/2021   Long term (current) use of anticoagulants 05/17/2021   Centrilobular emphysema (HCC) 11/10/2017   Pulmonary nodule 03/09/2016   Tobacco abuse 03/09/2016   COPD (chronic obstructive pulmonary disease) (HCC) 11/06/2015   Venous insufficiency 10/31/2015   CHF (congestive heart failure) (HCC) 10/29/2015  Chronic combined systolic and diastolic heart failure (HCC)    Hyperlipidemia 10/08/2015   Systolic and diastolic CHF, acute on chronic (HCC) 09/05/2015   Coronary artery disease of native artery of native heart with stable angina pectoris (HCC) 09/05/2015   History of mitral valve  replacement with bioprosthetic valve 09/05/2015   Biventricular ICD (implantable cardioverter-defibrillator) - [facility] 09/05/2015   CKD stage 4 due to type 2 diabetes mellitus (HCC) 09/05/2015   Essential hypertension 09/05/2015   Diabetes mellitus type 2 in obese 09/05/2015   Diabetes mellitus, type 2 (HCC) 09/05/2015   Paroxysmal atrial fibrillation (HCC) 09/05/2015   PCP:  Montez Hageman, DO Pharmacy:   [facility] - [place], [place] - [address] [place] [address] Phone: [phone] Fax: [phone]     Social Determinants of Health (SDOH) Social History: SDOH Screenings   Food Insecurity: No Food Insecurity (02/12/2023)  Housing: Low Risk  (02/12/2023)  Transportation Needs: No Transportation Needs (02/12/2023)  Utilities: Not At Risk (02/12/2023)  Financial Resource Strain: Medium Risk (02/11/2023)   Received from [facility]  Physical Activity: Inactive (02/11/2023)   Received from [facility]  Social Connections: Moderately Isolated (02/11/2023)   Received from [facility]  Stress: No Stress Concern Present (02/11/2023)   Received from [facility]  Tobacco Use: High Risk (02/12/2023)  Health Literacy: High Risk (02/11/2023)   Received from [facility]   SDOH Interventions:     Readmission Risk Interventions     No data to display

## 2023-02-14 NOTE — Progress Notes (Addendum)
Pre TEE orders written under sign/held - would not release until cath completed so that NPO after MN order does not get superceded by a follow-up diet order. Scheduled for 730 tomorrow AM with Dr. Jacques Navy. CBGs are noted to be controlled, did not get Lantus last night. Will hold Lantus altogether for now given NPO for cath today and NPO for TEE tomorrow. Relayed verbally to nurse as well. Will also place diabetes coordinator consult to help establish recommendations for ongoing therapy; will need discharge recs given the discrepancy between his insulin needs here vs home basal dosing.  Addendum: Also discussed care with DM coordinator. CBG checks updated to q4hr given NPO status, confirmed via chat OK to use the ESRD/very sensitive scale of 0-6 units. We will plan to keep this going until all procedures are complete through tomorrow. Rounding team will need to review diabetes plan tomorrow post-procedure, anticipate changing back to ac/hs once all procedures complete.

## 2023-02-14 NOTE — Anesthesia Preprocedure Evaluation (Signed)
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   Pulmonary exam normal breath sounds clear to auscultation       Cardiovascular hypertension, Pt. on medications and Pt. on home beta blockers pulmonary hypertension (severe)(-) angina + CAD, + Past MI, + CABG (2015), +CHF (EF 35%) and + DOE  + dysrhythmias (postop from CABG, LBBB, 1st degree AV block) Atrial Fibrillation (-) Cardiac Defibrillator (removed) + Valvular Problems/Murmurs (s/p MVR with bioprosthetic valve in 2017, now with severe MS, moderate AI, moderate AS, moderate-to-severe TR)  Rhythm:Regular Rate:Normal  HLD  R/LHC 02/14/2023:   Mid LM to Ost LAD lesion is 40% stenosed.   Ramus lesion is 100% stenosed.   Ost LAD to Prox LAD lesion is 80% stenosed.  Previously placed Mid LAD lesion is 100% stenosed.   Previously placed Ost Cx to Prox Cx stent of unknown type is  widely patent.   Prox Cx to Mid Cx lesion is 65% stenosed.   1st Mrg lesion is 50% stenosed.   Previously placed Prox RCA lesion is 40% stenosed -focal ISR.   Mid RCA lesion is 85% stenosed.   Mid RCA to Dist RCA lesion is 20% stenosed.  Dist RCA lesion is 20% stenosed.   SVG-RI graft was visualized by angiography and is large.  The graft exhibits no disease.   LIMA-LAD graft was visualized by angiography and is large.  The graft exhibits no disease.   ------------------------------------------------------------   Hemodynamic findings consistent with severe pulmonary  hypertension.   There is moderate aortic valve stenosis.   There is severe mitral valve stenosis.  TTE 02/13/2023: IMPRESSIONS     1. There is no left ventricular thrombus (Definity contrast was used).  There is severe septal-lateral dyssynchrony due to LBBB. Left ventricular  ejection fraction, by estimation, is 35 to 40%. The left ventricle has  moderately decreased function. The  left ventricle demonstrates global hypokinesis. The left ventricular  internal cavity size was mildly dilated. There is mild concentric left  ventricular hypertrophy. Indeterminate diastolic filling due to E-A  fusion.   2. Right ventricular systolic function is moderately reduced. The right  ventricular size is moderately enlarged. There is moderately elevated  pulmonary artery systolic pressure. The estimated right ventricular  systolic pressure is 57.3 mmHg.   3. Left atrial size was severely dilated.   4. Right atrial size was moderately dilated.   5. The mitral valve has been repaired/replaced. No evidence of mitral  valve regurgitation. Severe mitral stenosis. The mean mitral valve  gradient is 18.0 mmHg with average heart rate of 95 bpm. There is a 31 mm  Edwards Sapien bioprosthetic valve present   in the mitral position. Procedure Date: 09/05/2015. Echo findings are  consistent with stenosis the mitral prosthesis.   6. Large pleural effusion.   7. Tricuspid valve regurgitation is moderate to severe.   8. The aortic valve is tricuspid. There is moderate calcification of the  aortic valve. There is moderate thickening of the aortic valve. Aortic  valve regurgitation is moderate. Moderate aortic valve stenosis. Aortic  regurgitation  PHT measures 319 msec.  Aortic valve mean gradient measures 23.3 mmHg. Aortic valve Vmax measures  3.21 m/s.   9. Aortic dilatation noted. There is mild dilatation of the ascending  aorta, measuring 39 mm. There is mild dilatation of the aortic root,  measuring 42  mm.  10. Hepatic vein flow reversal is seen is seen intermittently (likely with  respiration, spirometry was not performed). The inferior vena cava is  dilated in size with >50% respiratory variability, suggesting right atrial  pressure of 8 mmHg.     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)     Musculoskeletal   Abdominal  (+) + obese  Peds  Hematology  (+) Blood dyscrasia, anemia Lab Results      Component                Value               Date                      WBC                      5.8                 02/15/2023                HGB                      10.3 (L)            02/15/2023                HCT                      31.9 (L)            02/15/2023                MCV                      88.6                02/15/2023                PLT                      183                 02/15/2023              Anesthesia Other Findings Last Eliquis: 02/10/2023  Reproductive/Obstetrics                             Anesthesia Physical Anesthesia Plan  ASA: 4  Anesthesia Plan: MAC   Post-op Pain Management:    Induction: Intravenous  PONV Risk Score and Plan: Propofol infusion, TIVA and Treatment may vary due to age or medical condition  Airway Management Planned: Natural Airway and Simple Face Mask  Additional Equipment:   Intra-op Plan:   Post-operative Plan: Extubation in OR  Informed Consent: I have reviewed the patients History and Physical, chart, labs and discussed the procedure including the risks, benefits and alternatives for the  proposed anesthesia with the patient or authorized representative who has indicated his/her understanding and acceptance.     Dental advisory given  Plan Discussed with: CRNA and Anesthesiologist  Anesthesia Plan Comments: (Plan  for phenylephrine infusion, Precedex, slow induction, high flow O2.  Discussed with patient risks of MAC including, but not limited to, minor pain or discomfort, hearing people in the room, and possible need for backup general anesthesia. Risks for general anesthesia also discussed including, but not limited to, sore throat, hoarse voice, chipped/damaged teeth, injury to vocal cords, nausea and vomiting, allergic reactions, lung infection, heart attack, stroke, and death. All questions answered. )       Anesthesia Quick Evaluation

## 2023-02-15 ENCOUNTER — Inpatient Hospital Stay (HOSPITAL_COMMUNITY): Payer: No Typology Code available for payment source | Admitting: Anesthesiology

## 2023-02-15 ENCOUNTER — Encounter (HOSPITAL_COMMUNITY)
Disposition: A | Discharge status: Home / Self Care | Payer: Self-pay | Source: Other Acute Inpatient Hospital | Attending: Cardiology

## 2023-02-15 ENCOUNTER — Inpatient Hospital Stay (HOSPITAL_COMMUNITY): Payer: Self-pay | Admitting: Anesthesiology

## 2023-02-15 ENCOUNTER — Inpatient Hospital Stay (HOSPITAL_COMMUNITY): Payer: No Typology Code available for payment source

## 2023-02-15 ENCOUNTER — Encounter (HOSPITAL_COMMUNITY): Payer: Self-pay | Admitting: Cardiology

## 2023-02-15 DIAGNOSIS — I342 Nonrheumatic mitral (valve) stenosis: Secondary | ICD-10-CM

## 2023-02-15 DIAGNOSIS — R001 Bradycardia, unspecified: Secondary | ICD-10-CM | POA: Diagnosis not present

## 2023-02-15 DIAGNOSIS — F17210 Nicotine dependence, cigarettes, uncomplicated: Secondary | ICD-10-CM | POA: Diagnosis not present

## 2023-02-15 DIAGNOSIS — N186 End stage renal disease: Secondary | ICD-10-CM | POA: Diagnosis not present

## 2023-02-15 DIAGNOSIS — I214 Non-ST elevation (NSTEMI) myocardial infarction: Secondary | ICD-10-CM | POA: Diagnosis not present

## 2023-02-15 DIAGNOSIS — I5021 Acute systolic (congestive) heart failure: Secondary | ICD-10-CM | POA: Diagnosis not present

## 2023-02-15 DIAGNOSIS — I340 Nonrheumatic mitral (valve) insufficiency: Secondary | ICD-10-CM

## 2023-02-15 DIAGNOSIS — R55 Syncope and collapse: Secondary | ICD-10-CM | POA: Diagnosis not present

## 2023-02-15 DIAGNOSIS — J449 Chronic obstructive pulmonary disease, unspecified: Secondary | ICD-10-CM

## 2023-02-15 HISTORY — PX: TEE WITHOUT CARDIOVERSION: SHX5443

## 2023-02-15 LAB — CBC
HCT: 31.9 % — ABNORMAL LOW (ref 39.0–52.0)
Hemoglobin: 10.3 g/dL — ABNORMAL LOW (ref 13.0–17.0)
MCH: 28.6 pg (ref 26.0–34.0)
MCHC: 32.3 g/dL (ref 30.0–36.0)
MCV: 88.6 fL (ref 80.0–100.0)
Platelets: 183 10*3/uL (ref 150–400)
RBC: 3.6 MIL/uL — ABNORMAL LOW (ref 4.22–5.81)
RDW: 18 % — ABNORMAL HIGH (ref 11.5–15.5)
WBC: 5.8 10*3/uL (ref 4.0–10.5)
nRBC: 0 % (ref 0.0–0.2)

## 2023-02-15 LAB — ECHO TEE
AR max vel: 1.28 cm2
AV Area VTI: 1.32 cm2
AV Area mean vel: 1.2 cm2
AV Mean grad: 24 mmHg
AV Peak grad: 38.7 mmHg
Ao pk vel: 3.11 m/s
Est EF: 30
P 1/2 time: 297 ms

## 2023-02-15 LAB — RENAL FUNCTION PANEL
Albumin: 2.3 g/dL — ABNORMAL LOW (ref 3.5–5.0)
Anion gap: 14 (ref 5–15)
BUN: 43 mg/dL — ABNORMAL HIGH (ref 8–23)
CO2: 19 mmol/L — ABNORMAL LOW (ref 22–32)
Calcium: 8.4 mg/dL — ABNORMAL LOW (ref 8.9–10.3)
Chloride: 100 mmol/L (ref 98–111)
Creatinine, Ser: 2.8 mg/dL — ABNORMAL HIGH (ref 0.61–1.24)
GFR, Estimated: 23 mL/min — ABNORMAL LOW (ref >60)
Glucose, Bld: 122 mg/dL — ABNORMAL HIGH (ref 70–99)
Phosphorus: 3.7 mg/dL (ref 2.5–4.6)
Potassium: 3.9 mmol/L (ref 3.5–5.1)
Sodium: 133 mmol/L — ABNORMAL LOW (ref 135–145)

## 2023-02-15 LAB — GLUCOSE, CAPILLARY
Glucose-Capillary: 109 mg/dL — ABNORMAL HIGH (ref 70–99)
Glucose-Capillary: 117 mg/dL — ABNORMAL HIGH (ref 70–99)
Glucose-Capillary: 119 mg/dL — ABNORMAL HIGH (ref 70–99)
Glucose-Capillary: 124 mg/dL — ABNORMAL HIGH (ref 70–99)
Glucose-Capillary: 152 mg/dL — ABNORMAL HIGH (ref 70–99)
Glucose-Capillary: 89 mg/dL (ref 70–99)
Glucose-Capillary: 99 mg/dL (ref 70–99)

## 2023-02-15 LAB — APTT: aPTT: 66 seconds — ABNORMAL HIGH (ref 24–36)

## 2023-02-15 LAB — HEPARIN LEVEL (UNFRACTIONATED): Heparin Unfractionated: 0.37 [IU]/mL (ref 0.30–0.70)

## 2023-02-15 SURGERY — ECHOCARDIOGRAM, TRANSESOPHAGEAL
Anesthesia: Monitor Anesthesia Care

## 2023-02-15 MED ORDER — PHENYLEPHRINE HCL-NACL 20-0.9 MG/250ML-% IV SOLN
INTRAVENOUS | Status: DC | PRN
Start: 2023-02-15 — End: 2023-02-15
  Administered 2023-02-15: 25 ug/min via INTRAVENOUS

## 2023-02-15 MED ORDER — PROPOFOL 500 MG/50ML IV EMUL
INTRAVENOUS | Status: DC | PRN
  Administered 2023-02-15: 100 ug/kg/min via INTRAVENOUS

## 2023-02-15 MED ORDER — DEXMEDETOMIDINE HCL IN NACL 80 MCG/20ML IV SOLN
INTRAVENOUS | Status: DC | PRN
Start: 2023-02-15 — End: 2023-02-15
  Administered 2023-02-15: 8 ug via INTRAVENOUS
  Administered 2023-02-15 (×2): 4 ug via INTRAVENOUS

## 2023-02-15 MED ORDER — ALTEPLASE 2 MG IJ SOLR: 2.0000 mg | Freq: Once | INTRAMUSCULAR | Status: DC | PRN

## 2023-02-15 MED ORDER — BUTAMBEN-TETRACAINE-BENZOCAINE 2-2-14 % EX AERO
INHALATION_SPRAY | CUTANEOUS | Status: AC
  Filled 2023-02-15: qty 20

## 2023-02-15 MED ORDER — HEPARIN SODIUM (PORCINE) 1000 UNIT/ML DIALYSIS: 1000.0000 [IU] | INTRAMUSCULAR | Status: DC | PRN

## 2023-02-15 MED ORDER — LIDOCAINE-PRILOCAINE 2.5-2.5 % EX CREA: 1.0000 | TOPICAL_CREAM | CUTANEOUS | Status: DC | PRN

## 2023-02-15 MED ORDER — PENTAFLUOROPROP-TETRAFLUOROETH EX AERO: 1.0000 | INHALATION_SPRAY | CUTANEOUS | Status: DC | PRN

## 2023-02-15 MED ORDER — ANTICOAGULANT SODIUM CITRATE 4% (200MG/5ML) IV SOLN: 5.0000 mL | Status: DC | PRN

## 2023-02-15 MED ORDER — LIDOCAINE HCL (PF) 1 % IJ SOLN: 5.0000 mL | INTRAMUSCULAR | Status: DC | PRN

## 2023-02-15 MED ORDER — HEPARIN SODIUM (PORCINE) 1000 UNIT/ML IJ SOLN
3800.0000 [IU] | Freq: Once | INTRAMUSCULAR | Status: AC
  Administered 2023-02-15: 3800 [IU] via INTRAVENOUS
  Filled 2023-02-15: qty 4

## 2023-02-15 NOTE — CV Procedure (Signed)
INDICATIONS: Prosthetic mitral valve stenosis  PROCEDURE:   Informed consent was obtained prior to the procedure. The risks, benefits and alternatives for the procedure were discussed and the patient comprehended these risks.  Risks include, but are not limited to, cough, sore throat, vomiting, nausea, somnolence, esophageal and stomach trauma or perforation, bleeding, low blood pressure, aspiration, pneumonia, infection, trauma to the teeth and death.    After a procedural time-out, the oropharynx was anesthetized with 20% benzocaine spray.   During this procedure the patient was administered propofol per anesthesia.  The patient's heart rate, blood pressure, and oxygen saturation were monitored continuously during the procedure. The period of conscious sedation was 30 minutes, of which I was present face-to-face 100% of this time.  The transesophageal probe was inserted in the esophagus and stomach without difficulty and multiple views were obtained.  The patient was kept under observation until the patient left the procedure room.  The patient left the procedure room in stable condition.   Agitated microbubble saline contrast was not administered.  COMPLICATIONS:    There were no immediate complications.  FINDINGS:   FORMAL ECHOCARDIOGRAM REPORT PENDING - await formal report for calculations.  Severe prosthetic mitral valve stenosis, gradient @ HR 81. Mild MR.  Moderate AS and at least moderate-severe AI, mean sys gradient 24 mmHg. PHT 297 ms.  Mild TR No valvular vegetations identified. Degenerative calcifications on the mitral and aortic valves.  EF ~ 30%, dyssynchrony due to conduction delay. Moderate RV dysfunction.  Severe LA dilation, moderate RA dilation.     Time Spent Directly with the Patient:  60 minutes   Layanna Charo A Jacques Navy 02/15/2023, 8:45 AM

## 2023-02-15 NOTE — Interval H&P Note (Signed)
History and Physical Interval Note:  02/15/2023 7:35 AM  Dominic [name]  has presented today for surgery, with the diagnosis of severe bio pros valve stenosis.  The various methods of treatment have been discussed with the patient and family. After consideration of risks, benefits and other options for treatment, the patient has consented to  Procedure(s): TRANSESOPHAGEAL ECHOCARDIOGRAM (N/A) as a surgical intervention.  The patient's history has been reviewed, patient examined, no change in status, stable for surgery.  I have reviewed the patient's chart and labs.  Questions were answered to the patient's satisfaction.     Parke Poisson

## 2023-02-15 NOTE — Plan of Care (Signed)
  Problem: Activity: Goal: Risk for activity intolerance will decrease Outcome: Progressing   Problem: Nutrition: Goal: Adequate nutrition will be maintained Outcome: Progressing   Problem: Coping: Goal: Level of anxiety will decrease Outcome: Progressing   Problem: Pain Managment: Goal: General experience of comfort will improve Outcome: Progressing   Problem: Safety: Goal: Ability to remain free from injury will improve Outcome: Progressing   

## 2023-02-15 NOTE — Progress Notes (Signed)
ANTICOAGULATION CONSULT NOTE  Pharmacy Consult for heparin Indication: atrial fibrillation  Allergies  Allergen Reactions   Hydrochlorothiazide Itching and Other (See Comments)    Hyponatremia   Amoxicillin-Pot Clavulanate Nausea And Vomiting   Clindamycin Nausea And Vomiting and Other (See Comments)    Chest pain   Lisinopril Other (See Comments) and Cough    hyponatremia    Meloxicam Itching and Nausea Only   Topiramate Other (See Comments)   Tamsulosin Other (See Comments)    dysuria     Patient Measurements: Height: 5\' 10"  (177.8 cm) Weight: 115.4 kg (254 lb 6.6 oz) (bed) IBW/kg (Calculated) : 73 Heparin Dosing Weight: 100 kg  Vital Signs: Temp: 97.6 F (36.4 C) (09/05 1004) Temp Source: Oral (09/05 1004) BP: 110/61 (09/05 1030) Pulse Rate: 86 (09/05 1030)  Labs: Recent Labs    02/13/23 0002 02/13/23 1017 02/13/23 1742 02/14/23 0221 02/14/23 0617 02/14/23 1417 02/14/23 1430 02/14/23 1734 02/15/23 0629 02/15/23 0630  HGB  --    < >  --  10.4*  --    < > 10.5* 11.0*  --  10.3*  HCT  --    < >  --  31.7*  --    < > 31.0* 33.4*  --  31.9*  PLT  --    < >  --  171  --   --   --  187  --  183  APTT  --   --  92*  --  110*  --   --   --   --  66*  HEPARINUNFRC  --   --  0.72*  --   --   --   --   --   --  0.37  CREATININE  --    < >  --  2.38*  --   --   --  2.79* 2.80*  --   TROPONINIHS 1,089*  --   --   --   --   --   --   --   --   --    < > = values in this interval not displayed.    Estimated Creatinine Clearance: 30.8 mL/min (A) (by C-G formula based on SCr of 2.8 mg/dL (H)).   Medical History: Past Medical History:  Diagnosis Date   Biventricular ICD (implantable cardioverter-defibrillator) - [facility] 09/05/2015   Jul 01, 2015 [facility] Dynagen X4   CAD (coronary artery disease) 09/05/2015   CABG 2015 PCI-BMS ostial LCX Sept 2015 04/05/2015 cath 50% distal LM, 90% prox LAD, old stent prox LAD 70% ISR, patent LIMA-LAD, 80% prox RCA PCI-DES x2 RCA and LCX     Chronic systolic CHF (congestive heart failure) (HCC) 09/05/2015   a. RHC 10/29/15: PCW 8, CI 2.2. Overall low filling presssures. Mean PA 20; b. echo 10/30/15: EF 45-50%, not tech suff to allwo for LV diastolic fxn, mild AI, nl appearing MVR   CKD stage 4 due to type 2 diabetes mellitus (HCC) 09/05/2015   Diabetes mellitus, type 2 (HCC) 09/05/2015   Essential hypertension 09/05/2015   History of mitral valve replacement with bioprosthetic valve 09/05/2015   31 mm Edwards 2015, Dr. Arvilla Market   Postoperative atrial fibrillation (HCC) 09/05/2015   Assessment: 65 yoM presented to [facility] for evaluation of syncope and bradycardia from [facility]. Relevant PMH includes CAD s/p CABG/bMVR (2015) & multiple PCIs (most recent 2016), ICM w/ EF recovery (EF = 45-50%), staph IE s/p ICD extraction (2023), PAF (on Eliquis), and ESRD on  HD (TTS). Pharmacy has been consulted to dose heparin for afib.  Last dose of eliquis given on 9/02 PM PTA.  Anti-Xa and aPTT now correlating, at goal on 1300 units/hr. No issues with heparin infusion or signs of bleeding reported. CBC stable, plts 183.   Goal of Therapy:  Heparin level 0.3-0.7 units/ml aPTT 66-102 seconds Monitor platelets by anticoagulation protocol: Yes   Plan:  Continue heparin infusion at 1300 units/hr Check heparin level daily while on heparin Continue to monitor H&H and platelets F/u transition to PO   Thank you for allowing pharmacy to be a part of this patient's care.  Thelma Barge, PharmD Clinical Pharmacist

## 2023-02-15 NOTE — Progress Notes (Signed)
   02/15/23 1247  During Treatment Monitoring  Intra-Hemodialysis Comments  (resumed treatment with new cartridge;A-V and V-A and increased flushes to 15 min.  Lowered head of bed as well.)

## 2023-02-15 NOTE — Progress Notes (Signed)
[name] Progress Note   Subjective:    Pt seen after TEE. He reports he is feeling a little short of breath today. Denies CP, palpitations, dizziness, nausea. He is scheduled for HD this AM.   Cardiology is hoping [facility] can be removed to get another CRT device at some point. Pt has a clotted AVF, had declot scheduled for next week. Will consult IR to see if we can get AVF declotted sooner, will need to make sure it is functional on HD before removing his TDC.   Objective Vitals:   02/14/23 2330 02/15/23 0335 02/15/23 0655 02/15/23 0700  BP:  (!) 90/58 128/70 128/73  Pulse:  97 (!) 102 98  Resp:   14 (!) 23  Temp:  97.8 F (36.6 C) 98 F (36.7 C)   TempSrc:  Oral Temporal   SpO2: 91% 92% 92% 91%  Weight:  113.7 kg    Height:       Physical Exam General: Alert male in NAD Heart: RRR, 2/6 murmur, no rubs or gallops Lungs: CTA bilaterally Abdomen: Soft, non-distended, +BS Extremities: No edema b/l lower extremities Dialysis Access: R internal jugular TDC, RUE AVF pulsatile  Additional Objective Labs: Basic Metabolic Panel: Recent Labs  Lab 02/13/23 0159 02/13/23 1017 02/14/23 0221 02/14/23 1417 02/14/23 1425 02/14/23 1430 02/14/23 1734 02/15/23 0629  NA  --  136 134*   < > 136 137  --  133*  K  --  4.0 3.8   < > 3.8 3.5  --  3.9  CL  --  103 98  --   --   --   --  100  CO2  --  20* 22  --   --   --   --  19*  GLUCOSE  --  134* 94  --   --   --   --  122*  BUN  --  50* 34*  --   --   --   --  43*  CREATININE  --  3.03* 2.38*  --   --   --  2.79* 2.80*  CALCIUM  --  8.5* 8.3*  --   --   --   --  8.4*  PHOS 3.7  --   --   --   --   --   --  3.7   < > = values in this interval not displayed.   Liver Function Tests: Recent Labs  Lab 02/12/23 0003 02/15/23 0629  AST 33  --   ALT 26  --   ALKPHOS 87  --   BILITOT 0.6  --   PROT 6.3*  --   ALBUMIN 2.2* 2.3*   No results for input(s): "LIPASE", "AMYLASE" in the last 168 hours. CBC: Recent Labs   Lab 02/12/23 0003 02/13/23 1017 02/14/23 0221 02/14/23 1417 02/14/23 1430 02/14/23 1734 02/15/23 0630  WBC 7.0 6.8 5.0  --   --  5.8 5.8  NEUTROABS 5.2  --   --   --   --   --   --   HGB 10.6* 10.9* 10.4*   < > 10.5* 11.0* 10.3*  HCT 33.2* 34.2* 31.7*   < > 31.0* 33.4* 31.9*  MCV 92.0 89.3 90.1  --   --  90.8 88.6  PLT 179 176 171  --   --  187 183   < > = values in this interval not displayed.   CBG: Recent Labs  Lab 02/14/23 1549 02/14/23  2035 02/14/23 2311 02/15/23 0334 02/15/23 0712  GLUCAP 89 116* 117* 124* 119*    Studies/Results: CARDIAC CATHETERIZATION  Result Date: 02/15/2023   Mid LM to Ost LAD lesion is 40% stenosed.   Ramus lesion is 100% stenosed.   Ost LAD to Prox LAD lesion is 80% stenosed.  Previously placed Mid LAD lesion is 100% stenosed.   Previously placed Ost Cx to Prox Cx stent of unknown type is  widely patent.   Prox Cx to Mid Cx lesion is 65% stenosed.   1st Mrg lesion is 50% stenosed.   Previously placed Prox RCA lesion is 40% stenosed -focal ISR.   Mid RCA lesion is 85% stenosed.   Mid RCA to Dist RCA lesion is 20% stenosed.  Dist RCA lesion is 20% stenosed.   SVG-RI graft was visualized by angiography and is large.  The graft exhibits no disease.   LIMA-LAD graft was visualized by angiography and is large.  The graft exhibits no disease.   ------------------------------------------------------------   Hemodynamic findings consistent with severe pulmonary hypertension.   There is moderate aortic valve stenosis.   There is severe mitral valve stenosis. Severe multivessel CAD: -> Distal LM 40%, ostial LAD 80% followed by short normal segment with SP and small to Diag branch then 100% occluded stent 100% ostial RI occlusion\ Widely patent ostial-proximal LCx stent with ~65% stenosis just distal to the stent and prior to OM/AVG bifurcation with trivial disease in the OM branches 40% ostial RCA with heavily calcified RCA (difficult to assess stent versus  calcification. The stent appears to be in the proximal to mid segment followed by a 85% stenosis in the mid RCA. -> Widely patent large LIMA to mid LAD with retrograde filling of the 2nd Diag branch; widely patent SVG-RI Right Heart Cath/Hemodynamics: RA P 13 mmHg; RVP-EDP 65/3-11 mmHg; PAP-mean 60/35-45 mmHg, PCWP 40 to 44 mmHg (V waves appeared more prominent), LV P-EDP 150/6-14 mmHg, AO P-MAP 114/64-82 mmHg Wedge-LVEDP gradient ~30 mmHg; AoV mean gradient 35 mmHg Ao sat 88%, PA sat 56%; (FICK) Cardiac Output-Index 6.76-2.94 RECOMMENDATIONS Optimization of medical and fluid management Will likely need staged imaging-guided PCI of the RCA with potential FFR guided PCI to the LCx as well => discussion with Dr. Lennie Odor, this will need to be done after having his dialysis fistula cleared and dialysis catheter removed (this would prevent having to hold Plavix). Bryan Lemma, MD  ECHOCARDIOGRAM COMPLETE  Result Date: 02/13/2023    ECHOCARDIOGRAM REPORT   Patient Name:   Dominic [name] Date of Exam: 02/13/2023 Medical Rec #:  [id number]      Height:       70.0 in Accession #:    [id number]     Weight:       258.8 lb Date of Birth:  05/11/1951      BSA:          2.329 m Patient Age:    72 years       BP:           124/76 mmHg Patient Gender: M              HR:           94 bpm. Exam Location:  Inpatient Procedure: 2D Echo, Color Doppler, Cardiac Doppler and Intracardiac            Opacification Agent Indications:    I44.7 LBBB  History:        Patient has prior history of Echocardiogram  examinations, most                 recent 01/23/2022. CHF, Abnormal ECG, Defibrillator and Prior                 CABG, COPD, Endocarditis, Mitral Stenosis, Mitral Valve Disease                 and Aortic Valve Disease, Arrythmias:LBBB,                 Signs/Symptoms:Syncope; Risk Factors:Current Smoker and                 Hypertension. ESRD. MVR.                  Mitral Valve: 31 mm Edwards Sapien bioprosthetic valve valve is                  present in the mitral position. Procedure Date: 09/05/2015.  Sonographer:    Sheralyn Boatman RDCS Referring Phys: [id number] [facility]  Sonographer Comments: Technically difficult study due to poor echo windows. IMPRESSIONS  1. There is no left ventricular thrombus (Definity contrast was used). There is severe septal-lateral dyssynchrony due to LBBB. Left ventricular ejection fraction, by estimation, is 35 to 40%. The left ventricle has moderately decreased function. The left ventricle demonstrates global hypokinesis. The left ventricular internal cavity size was mildly dilated. There is mild concentric left ventricular hypertrophy. Indeterminate diastolic filling due to E-A fusion.  2. Right ventricular systolic function is moderately reduced. The right ventricular size is moderately enlarged. There is moderately elevated pulmonary artery systolic pressure. The estimated right ventricular systolic pressure is 57.3 mmHg.  3. Left atrial size was severely dilated.  4. Right atrial size was moderately dilated.  5. The mitral valve has been repaired/replaced. No evidence of mitral valve regurgitation. Severe mitral stenosis. The mean mitral valve gradient is 18.0 mmHg with average heart rate of 95 bpm. There is a 31 mm Edwards Sapien bioprosthetic valve present  in the mitral position. Procedure Date: 09/05/2015. Echo findings are consistent with stenosis the mitral prosthesis.  6. Large pleural effusion.  7. Tricuspid valve regurgitation is moderate to severe.  8. The aortic valve is tricuspid. There is moderate calcification of the aortic valve. There is moderate thickening of the aortic valve. Aortic valve regurgitation is moderate. Moderate aortic valve stenosis. Aortic regurgitation PHT measures 319 msec. Aortic valve mean gradient measures 23.3 mmHg. Aortic valve Vmax measures 3.21 m/s.  9. Aortic dilatation noted. There is mild dilatation of the ascending aorta, measuring 39 mm. There is mild dilatation of  the aortic root, measuring 42 mm. 10. Hepatic vein flow reversal is seen is seen intermittently (likely with respiration, spirometry was not performed). The inferior vena cava is dilated in size with >50% respiratory variability, suggesting right atrial pressure of 8 mmHg. Comparison(s): The left ventricular function is worsened. There is marked increase in mitral valve prosthesis gradients, which may be partly related to the increase in heart rate, but could also be due to prosthetic valve endocarditis. Pulmonary artery hypertension is worse, likley due to increase left heart pressures. Aortic stenosis is only slightly worse. 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 Consider TEE for evaluation for endocarditis of the mitral valve prosthesis. Consider a component of constrictive physiology (evaluate mitral flow and hepatic vein flow with spirometry). FINDINGS  Left Ventricle: There is no left ventricular thrombus (Definity contrast was used). There is severe septal-lateral dyssynchrony  due to LBBB. Left ventricular ejection fraction, by estimation, is 35 to 40%. The left ventricle has moderately decreased function. The left ventricle demonstrates global hypokinesis. Definity contrast agent was given IV to delineate the left ventricular endocardial borders. The left ventricular internal cavity size was mildly dilated. There is mild concentric left ventricular hypertrophy. Abnormal (paradoxical) septal motion, consistent with left bundle branch block. Indeterminate diastolic filling due to E-A fusion. Right Ventricle: The right ventricular size is moderately enlarged. Right vetricular wall thickness was not well visualized. Right ventricular systolic function is moderately reduced. There is moderately elevated pulmonary artery systolic pressure. The tricuspid regurgitant velocity is 3.51 m/s, and with an assumed right atrial pressure of 8 mmHg, the estimated right  ventricular systolic pressure is 57.3 mmHg. Left Atrium: Left atrial size was severely dilated. Right Atrium: Right atrial size was moderately dilated. Pericardium: There is no evidence of pericardial effusion. Mitral Valve: The mitral valve has been repaired/replaced. No evidence of mitral valve regurgitation. There is a 31 mm Edwards Sapien bioprosthetic valve present in the mitral position. Procedure Date: 09/05/2015. 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 Severe mitral valve stenosis. MV peak gradient, 25.6 mmHg. The mean mitral valve gradient is 18.0 mmHg with average heart rate of 95 bpm. Tricuspid Valve: The tricuspid valve is grossly normal. Tricuspid valve regurgitation is moderate to severe. No evidence of tricuspid stenosis. Aortic Valve: The aortic valve is tricuspid. There is moderate calcification of the aortic valve. There is moderate thickening of the aortic valve. Aortic valve regurgitation is moderate. Aortic regurgitation PHT measures 319 msec. Moderate aortic stenosis is present. Aortic valve mean gradient measures 23.3 mmHg. Aortic valve peak gradient measures 41.1 mmHg. Aortic valve area, by VTI measures 1.26 cm. Pulmonic Valve: The pulmonic valve was grossly normal. Pulmonic valve regurgitation is not visualized. No evidence of pulmonic stenosis. Aorta: Aortic dilatation noted. There is mild dilatation of the ascending aorta, measuring 39 mm. There is mild dilatation of the aortic root, measuring 42 mm. Venous: Hepatic vein flow reversal is seen is seen intermittently (likely with respiration, spirometry was not performed). The inferior vena cava is dilated in size with greater than 50% respiratory variability, suggesting right atrial pressure of 8 mmHg. The inferior vena cava and the hepatic vein show a pattern of systolic flow reversal, suggestive of tricuspid regurgitation. IAS/Shunts: No atrial level shunt detected by color flow Doppler. Additional Comments:  There is a large pleural effusion.  LEFT VENTRICLE PLAX 2D LVIDd:         6.00 cm      Diastology LVIDs:         4.80 cm      LV e' medial:  3.48 cm/s LV PW:         1.30 cm      LV e' lateral: 7.40 cm/s LV IVS:        1.40 cm LVOT diam:     2.20 cm LV SV:         71 LV SV Index:   30 LVOT Area:     3.80 cm  LV Volumes (MOD) LV vol d, MOD A2C: 142.5 ml LV vol d, MOD A4C: 162.0 ml LV vol s, MOD A2C: 87.3 ml LV vol s, MOD A4C: 101.4 ml LV SV MOD A2C:     55.2 ml LV SV MOD A4C:     162.0 ml LV SV MOD BP:      57.0 ml RIGHT VENTRICLE  IVC RV S prime:     6.31 cm/s  IVC diam: 2.70 cm TAPSE (M-mode): 1.0 cm LEFT ATRIUM             Index        RIGHT ATRIUM           Index LA diam:        5.20 cm 2.23 cm/m   RA Area:     16.40 cm LA Vol (A2C):   53.9 ml 23.14 ml/m  RA Volume:   43.90 ml  18.85 ml/m LA Vol (A4C):   70.0 ml 30.06 ml/m LA Biplane Vol: 65.2 ml 28.00 ml/m  AORTIC VALVE AV Area (Vmax):    1.27 cm AV Area (Vmean):   1.23 cm AV Area (VTI):     1.26 cm AV Vmax:           320.67 cm/s AV Vmean:          222.000 cm/s AV VTI:            0.559 m AV Peak Grad:      41.1 mmHg AV Mean Grad:      23.3 mmHg LVOT Vmax:         107.00 cm/s LVOT Vmean:        71.800 cm/s LVOT VTI:          0.186 m LVOT/AV VTI ratio: 0.33 AI PHT:            319 msec  AORTA Ao Root diam: 4.20 cm Ao Asc diam:  4.05 cm MITRAL VALVE             TRICUSPID VALVE MV Peak grad: 25.6 mmHg  TR Peak grad:   49.3 mmHg MV Mean grad: 18.0 mmHg  TR Vmax:        351.00 cm/s MV Vmax:      2.53 m/s MV Vmean:     204.5 cm/s SHUNTS                          Systemic VTI:  0.19 m                          Systemic Diam: 2.20 cm Mihai Croitoru MD Electronically signed by Thurmon Fair MD Signature Date/Time: 02/13/2023/2:17:28 PM    Final    Medications:  [BMW Hold] sodium chloride     sodium chloride     [MAR Hold] sodium chloride     [MAR Hold] anticoagulant sodium citrate     heparin 1,300 Units/hr (02/15/23 0737)    [MAR Hold] allopurinol   100 mg Oral Daily   [MAR Hold] amiodarone  200 mg Oral Daily   [MAR Hold] aspirin EC  81 mg Oral Daily   [MAR Hold] atorvastatin  40 mg Oral Daily   butamben-tetracaine-benzocaine       [MAR Hold] Chlorhexidine Gluconate Cloth  6 each Topical Daily   [MAR Hold] Chlorhexidine Gluconate Cloth  6 each Topical Q0600   [MAR Hold] cholecalciferol  2,000 Units Oral Daily   [MAR Hold] DULoxetine  60 mg Oral Daily   [MAR Hold] famotidine  10 mg Oral Daily   [MAR Hold] ferrous sulfate  325 mg Oral Q breakfast   [MAR Hold] hydrALAZINE  10 mg Oral TID   [MAR Hold] influenza vaccine adjuvanted  0.5 mL Intramuscular Tomorrow-1000   [MAR Hold] insulin aspart  0-6 Units Subcutaneous Q4H   [  MAR Hold] isosorbide mononitrate  30 mg Oral Daily   [MAR Hold] midodrine  10 mg Oral Q T,Th,Sa-HD   [MAR Hold] mometasone-formoterol  2 puff Inhalation BID   [MAR Hold] nicotine  14 mg Transdermal Daily   [MAR Hold] sodium chloride flush  3 mL Intravenous Q12H   [MAR Hold] sodium chloride flush  3 mL Intravenous Q12H   [MAR Hold] sucralfate  1 g Oral BID    Dialysis Orders: [facility] TTS Dr Lequita Halt  3.5h 112kg   300/600   3K/2.5Ca bath  TDC   Heparin none  Assessment/Plan: 1.Syncope/ bradycardia - cardiology consulting. EF 35% with severe stenosis of prosthetic mitral valve. Cardiology is hoping [facility] can be removed to get another CRT device at some point. Pt has a clotted AVF, had declot scheduled for next week. Will consult IR to see if we can get AVF declotted sooner, will need to make sure it is functional on HD before removing his TDC. Patient made NPO per IR request. 2.Acute on chronic hypoxic resp failure - w/ pulm edema by CXR. Further UF with HD today as tolerated.  3.ESRD - on HD TTS. Has not missed HD lately, Continue TTS schedule 4.HTN/ volume - BP's are borderline low. Holding home HTN meds and nitrates. Removing volume as tolerated.  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 Not currently on a binder but is NPO for procedure anyway. If phos trends up, will start binder  7.NSTEMI - per cardiology 8.PAF- bradycardia this admission, see above. Rate currently controlled.     Rogers Blocker, PA-C 02/15/2023, 8:25 AM  [facility] Pager: [phone]

## 2023-02-15 NOTE — Progress Notes (Addendum)
Rounding Note    Patient Name: Dominic [name] Date of Encounter: 02/15/2023  [facility] Cardiologist: Thurmon Fair, MD   Subjective   Doing OK, baseline SOB about the same, no CP  Inpatient Medications    Scheduled Meds:  [MAR Hold] allopurinol  100 mg Oral Daily   [MAR Hold] amiodarone  200 mg Oral Daily   [MAR Hold] aspirin EC  81 mg Oral Daily   [MAR Hold] atorvastatin  40 mg Oral Daily   butamben-tetracaine-benzocaine       [MAR Hold] Chlorhexidine Gluconate Cloth  6 each Topical Daily   [MAR Hold] Chlorhexidine Gluconate Cloth  6 each Topical Q0600   [MAR Hold] cholecalciferol  2,000 Units Oral Daily   [MAR Hold] DULoxetine  60 mg Oral Daily   [MAR Hold] famotidine  10 mg Oral Daily   [MAR Hold] ferrous sulfate  325 mg Oral Q breakfast   [MAR Hold] hydrALAZINE  10 mg Oral TID   [MAR Hold] influenza vaccine adjuvanted  0.5 mL Intramuscular Tomorrow-1000   [MAR Hold] insulin aspart  0-6 Units Subcutaneous Q4H   [MAR Hold] isosorbide mononitrate  30 mg Oral Daily   [MAR Hold] midodrine  10 mg Oral Q T,Th,Sa-HD   [MAR Hold] mometasone-formoterol  2 puff Inhalation BID   [MAR Hold] nicotine  14 mg Transdermal Daily   [MAR Hold] sodium chloride flush  3 mL Intravenous Q12H   [MAR Hold] sodium chloride flush  3 mL Intravenous Q12H   [MAR Hold] sucralfate  1 g Oral BID   Continuous Infusions:  [MAR Hold] sodium chloride     sodium chloride     [MAR Hold] sodium chloride     [MAR Hold] anticoagulant sodium citrate     heparin 1,300 Units/hr (02/15/23 0737)   PRN Meds: [MAR Hold] sodium chloride, [MAR Hold] sodium chloride, [MAR Hold] acetaminophen, [MAR Hold] albuterol, [MAR Hold] alteplase, [MAR Hold] anticoagulant sodium citrate, butamben-tetracaine-benzocaine, [MAR Hold] fluticasone, [MAR Hold] heparin, [MAR Hold] lidocaine (PF), [MAR Hold] lidocaine-prilocaine, [MAR Hold] midodrine, [MAR Hold] ondansetron (ZOFRAN) IV, [MAR Hold]  pentafluoroprop-tetrafluoroeth, [MAR Hold] sodium chloride flush, [MAR Hold] sodium chloride flush   Vital Signs    Vitals:   02/15/23 0655 02/15/23 0700 02/15/23 0846 02/15/23 0856  BP: 128/70 128/73 102/69 107/65  Pulse: (!) 102 98 78 81  Resp: 14 (!) 23 16 15   Temp: 98 F (36.7 C)  97.8 F (36.6 C)   TempSrc: Temporal  Tympanic   SpO2: 92% 91% 92% 94%  Weight:      Height:        Intake/Output Summary (Last 24 hours) at 02/15/2023 0905 Last data filed at 02/15/2023 0843 Gross per 24 hour  Intake 684.66 ml  Output 1250 ml  Net -565.34 ml      02/15/2023    3:35 AM 02/14/2023    5:00 AM 02/13/2023    4:24 PM  Last 3 Weights  Weight (lbs) 250 lb 10.6 oz 250 lb 14.1 oz 254 lb 13.6 oz  Weight (kg) 113.7 kg 113.8 kg 115.6 kg      Telemetry    SR, PACs, 90's no bradycardia (unchanged)  - Personally Reviewed  ECG    No new EKGs - Personally Reviewed  Physical Exam   Unchanged from yesterday GEN: No acute distress.   Neck: No JVD Cardiac: RRR, 2-3 DM, rubs, or gallops.  Respiratory: CTA b/l GI: Soft, nontender, non-distended  MS: No edema; No deformity. Neuro:  Nonfocal  Psych:  Normal affect   Labs    High Sensitivity Troponin:   Recent Labs  Lab 02/12/23 0003 02/13/23 0002  TROPONINIHS 1,099* 1,089*     Chemistry Recent Labs  Lab 02/12/23 0003 02/13/23 1017 02/14/23 0221 02/14/23 1417 02/14/23 1425 02/14/23 1430 02/14/23 1734 02/15/23 0629  NA 134* 136 134*   < > 136 137  --  133*  K 4.1 4.0 3.8   < > 3.8 3.5  --  3.9  CL 105 103 98  --   --   --   --  100  CO2 20* 20* 22  --   --   --   --  19*  GLUCOSE 84 134* 94  --   --   --   --  122*  BUN 50* 50* 34*  --   --   --   --  43*  CREATININE 3.26* 3.03* 2.38*  --   --   --  2.79* 2.80*  CALCIUM 8.3* 8.5* 8.3*  --   --   --   --  8.4*  MG 1.9  --   --   --   --   --   --   --   PROT 6.3*  --   --   --   --   --   --   --   ALBUMIN 2.2*  --   --   --   --   --   --  2.3*  AST 33  --   --   --   --    --   --   --   ALT 26  --   --   --   --   --   --   --   ALKPHOS 87  --   --   --   --   --   --   --   BILITOT 0.6  --   --   --   --   --   --   --   GFRNONAA 19* 21* 28*  --   --   --  23* 23*  ANIONGAP 9 13 14   --   --   --   --  14   < > = values in this interval not displayed.    Lipids  Recent Labs  Lab 02/12/23 0004  CHOL 128  TRIG 106  HDL 46  LDLCALC 61  CHOLHDL 2.8    Hematology Recent Labs  Lab 02/14/23 0221 02/14/23 1417 02/14/23 1430 02/14/23 1734 02/15/23 0630  WBC 5.0  --   --  5.8 5.8  RBC 3.52*  --   --  3.68* 3.60*  HGB 10.4*   < > 10.5* 11.0* 10.3*  HCT 31.7*   < > 31.0* 33.4* 31.9*  MCV 90.1  --   --  90.8 88.6  MCH 29.5  --   --  29.9 28.6  MCHC 32.8  --   --  32.9 32.3  RDW 18.3*  --   --  18.2* 18.0*  PLT 171  --   --  187 183   < > = values in this interval not displayed.   Thyroid  Recent Labs  Lab 02/12/23 0003  TSH 2.501    BNP Recent Labs  Lab 02/12/23 0005  BNP 1,051.6*    DDimer No results for input(s): "DDIMER" in the last 168 hours.   Radiology      Cardiac Studies  02/15/23: TEE Severe prosthetic mitral valve stenosis, gradient @ HR 81. Mild MR.  Moderate AS and at least moderate-severe AI, mean sys gradient 24 mmHg. PHT 297 ms.  Mild TR No valvular vegetations identified. Degenerative calcifications on the mitral and aortic valves.  EF ~ 30%, dyssynchrony due to conduction delay. Moderate RV dysfunction.  Severe LA dilation, moderate RA dilation.  02/14/23: R/LHC Mid LM to Ost LAD lesion is 40% stenosed.   Ramus lesion is 100% stenosed.   Ost LAD to Prox LAD lesion is 80% stenosed.  Previously placed Mid LAD lesion is 100% stenosed.   Previously placed Ost Cx to Prox Cx stent of unknown type is  widely patent.   Prox Cx to Mid Cx lesion is 65% stenosed.   1st Mrg lesion is 50% stenosed.   Previously placed Prox RCA lesion is 40% stenosed -focal ISR.   Mid RCA lesion is 85% stenosed.   Mid RCA to Dist  RCA lesion is 20% stenosed.  Dist RCA lesion is 20% stenosed.   SVG-RI graft was visualized by angiography and is large.  The graft exhibits no disease.   LIMA-LAD graft was visualized by angiography and is large.  The graft exhibits no disease.   ------------------------------------------------------------   Hemodynamic findings consistent with severe pulmonary hypertension.   There is moderate aortic valve stenosis.   There is severe mitral valve stenosis.   Severe multivessel CAD: -> Distal LM 40%, ostial LAD 80% followed by short normal segment with SP and small to Diag branch then 100% occluded stent 100% ostial RI occlusion\ Widely patent ostial-proximal LCx stent with ~65% stenosis just distal to the stent and prior to OM/AVG bifurcation with trivial disease in the OM branches 40% ostial RCA with heavily calcified RCA (difficult to assess stent versus calcification. The stent appears to be in the proximal to mid segment followed by a 85% stenosis in the mid RCA. -> Widely patent large LIMA to mid LAD with retrograde filling of the 2nd Diag branch; widely patent SVG-RI Right Heart Cath/Hemodynamics: RA P 13 mmHg; RVP-EDP 65/3-11 mmHg; PAP-mean 60/35-45 mmHg, PCWP 40 to 44 mmHg (V waves appeared more prominent), LV P-EDP 150/6-14 mmHg, AO P-MAP 114/64-82 mmHg Wedge-LVEDP gradient ~30 mmHg; AoV mean gradient 35 mmHg Ao sat 88%, PA sat 56%; (FICK) Cardiac Output-Index 6.76-2.94    RECOMMENDATIONS Optimization of medical and fluid management Will likely need staged imaging-guided PCI of the RCA with potential FFR guided PCI to the LCx as well => discussion with Dr. Lennie Odor, this will need to be done after having his dialysis fistula cleared and dialysis catheter removed (this would prevent having to hold Plavix).    02/13/23: TTE 1. There is no left ventricular thrombus (Definity contrast was used).  There is severe septal-lateral dyssynchrony due to LBBB. Left ventricular  ejection  fraction, by estimation, is 35 to 40%. The left ventricle has  moderately decreased function. The  left ventricle demonstrates global hypokinesis. The left ventricular  internal cavity size was mildly dilated. There is mild concentric left  ventricular hypertrophy. Indeterminate diastolic filling due to E-A  fusion.   2. Right ventricular systolic function is moderately reduced. The right  ventricular size is moderately enlarged. There is moderately elevated  pulmonary artery systolic pressure. The estimated right ventricular  systolic pressure is 57.3 mmHg.   3. Left atrial size was severely dilated.   4. Right atrial size was moderately dilated.   5. The mitral valve has been repaired/replaced. No  evidence of mitral  valve regurgitation. Severe mitral stenosis. The mean mitral valve  gradient is 18.0 mmHg with average heart rate of 95 bpm. There is a 31 mm  Edwards Sapien bioprosthetic valve present   in the mitral position. Procedure Date: 09/05/2015. Echo findings are  consistent with stenosis the mitral prosthesis.   6. Large pleural effusion.   7. Tricuspid valve regurgitation is moderate to severe.   8. The aortic valve is tricuspid. There is moderate calcification of the  aortic valve. There is moderate thickening of the aortic valve. Aortic  valve regurgitation is moderate. Moderate aortic valve stenosis. Aortic  regurgitation PHT measures 319 msec.  Aortic valve mean gradient measures 23.3 mmHg. Aortic valve Vmax measures  3.21 m/s.   9. Aortic dilatation noted. There is mild dilatation of the ascending  aorta, measuring 39 mm. There is mild dilatation of the aortic root,  measuring 42 mm.  10. Hepatic vein flow reversal is seen is seen intermittently (likely with  respiration, spirometry was not performed). The inferior vena cava is  dilated in size with >50% respiratory variability, suggesting right atrial  pressure of 8 mmHg.   Comparison(s): The left ventricular  function is worsened. There is marked  increase in mitral valve prosthesis gradients, which may be partly related  to the increase in heart rate, but could also be due to prosthetic valve  endocarditis. Pulmonary artery  hypertension is worse, likley due to increase left heart pressures. Aortic  stenosis is only slightly worse.   06/13/2022: TTE ([facility]) SUMMARY  Left ventricular systolic function is moderate to severely reduced.  LV ejection fraction = 30-35%.  There is moderate global hypokinesis of the left ventricle.  Abnormal  paradoxical  septal motion consistent with LBBB.  The right ventricular systolic function is normal.  The left atrium is moderately to severely dilated.  There is moderate to severe aortic stenosis.  There is moderate aortic regurgitation.  There is a bioprosthetic mitral valve.  There is moderate mitral stenosis.  There is trace mitral regurgitation.  There is mild tricuspid regurgitation.  Mild pulmonary hypertension.  Estimated right ventricular systolic pressure is 50 mmHg.  There is no pericardial effusion.  Compared to the last study dated 11-06-2021, LV systolic function has declined.  The aortic stenosis and mitral  prosthetic  stenosis have worsened.   Patient Profile     72 y.o. male combined CHF (systolic/diastolic), ICM, CAD, VHD (s/p MVR bioprosthetic/CABG, PCIs), COPD (on home O2 3L), ESRF on HD, DM, AFib admitted with syncope  Hx of: staph bacteremia April 2023 >>  suspicion of small vegetations on the mitral valve bioprosthesis, RV lead and tricuspid valve.  He received 6 weeks of intravenous daptomycin and oral rifampin.  TEE performed on 10/14/2021 at [facility] that did not show any evidence of vegetations on the prosthetic mitral valve or on the pacemaker leads and showed moderate aortic insufficiency. The mean mitral valve gradient was 6 mmHg at a heart rate of 92 bpm. He continued to feel ill, poor appetite and weight loss.  TTE  11/06/2021 showed LVEF 55-60%, moderate aortic stenosis, moderate-severe aortic insufficiency, mean MVR gradient 6 mm Hg at 79 bpm.  He underwent laser lead and generator extraction of the BiV-ICD on 11/17/2021 by Dr. Malvin Johns.  Blood cultures 12/28/2021 at [facility] - no growth.  Tunneled catheter was removed 12/22/2021      Subsequent echos >> LVEF down to 30-35% by TTE 06/13/22 >>  This admision 35-40%  RV mildly reduced  LBBB  Assessment & Plan    Recurrent syncope Witnessed HRs to high 20's-30's by EMS and transcutaneously paced Treated with atropine and epi > nor epi briefly    Hx of staph bacteremia apparently involved valve and leads ICD system extracted June 2023 Now with a HD catheter to R chest  Pending AV fistula next week 02/19/23.(RUE fistula clotted) Planned to have IT team see him here today Typically we do not like go back to prior extracted site (prior on the left)  Will need brady pacing support RV pacing is not ideal for him hemodynamically Best case scenario would be to at some point get another CRT device in him (once fistula is functioning and HD catheter is out)  PENDING IR discussion on his fistula and nephrology as to when his HD cath can come out >> perhaps ideally temp-perm via internal jugular with efforts to CRT device in a few weeks   ? Plans for valve work ? Timing of his PCIs (need for DAPT) ? AV fistula   NPO after MN for possible temp-perm      C/w attending/primary cardiology team:  2. Paroxysmal AFib CHA2DS2Vasc is 4, >> home OAC held heparin here Remains on amiodarone  Resume BB once he has pacing support   3. VHD Severe prosthetic MV stenosis RHC as above TEE  Severe MS Mod AS w/mod-severe AI No vegetations   4. Acute/chronic CHF (systolic) Volume w/ HD LVEF by TEE this AM ~30%, mod RV dysfunction   5. NSTEMI Suspect 2/2 profound bradycardia R/LHC as above Planned for staged PCI to RCA/LCx once past fistula  procedure       For questions or updates, please contact [facility] Please consult [email or web address] for contact info under        Signed, Sheilah Pigeon, PA-C  02/15/2023, 9:05 AM

## 2023-02-15 NOTE — Progress Notes (Signed)
Mobility Specialist Progress Note:   02/15/23 1600  Oxygen Therapy  O2 Device Nasal Cannula  O2 Flow Rate (L/min) 3 L/min  Mobility  Activity Ambulated with assistance in hallway  Level of Assistance Contact guard assist, steadying assist  Assistive Device Front wheel walker  Distance Ambulated (ft) 100 ft  Activity Response Tolerated well  Mobility Referral Yes  $Mobility charge 1 Mobility  Mobility Specialist Start Time (ACUTE ONLY) 1606  Mobility Specialist Stop Time (ACUTE ONLY) 1625  Mobility Specialist Time Calculation (min) (ACUTE ONLY) 19 min    Pre Mobility: 97 HR,  92% SpO2 During Mobility: 124 HR Post Mobility:  99 HR,  91% SpO2  Pt received in bed, agreeable to mobility. C/o some SOB and fatigue. Pt left in bed with call bell and all needs met.  D'Vante Earlene Plater Mobility Specialist Please contact via Special educational needs teacher or Rehab office at [phone]

## 2023-02-15 NOTE — Progress Notes (Signed)
   02/15/23 1230  During Treatment Monitoring  Intra-Hemodialysis Comments  (had to change cartridge due to clotting off.)

## 2023-02-15 NOTE — Anesthesia Procedure Notes (Signed)
Procedure Name: MAC Date/Time: 02/15/2023 8:47 AM  Performed by: Waynard Edwards, CRNAPre-anesthesia Checklist: Patient identified, Emergency Drugs available, Suction available and Patient being monitored Patient Re-evaluated:Patient Re-evaluated prior to induction Oxygen Delivery Method: Nasal cannula Induction Type: IV induction Airway Equipment and Method: Bite block Placement Confirmation: positive ETCO2 Dental Injury: Teeth and Oropharynx as per pre-operative assessment

## 2023-02-15 NOTE — Progress Notes (Signed)
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  No vegetation noted.  Needs further diuresis.  Still short of breath.  HD planned this morning.  ROS:  All other ROS reviewed and negative. Pertinent positives noted in the HPI.     Inpatient Medications  Scheduled Meds:  [MAR Hold] allopurinol  100 mg Oral Daily   [MAR Hold] amiodarone  200 mg Oral Daily   [MAR Hold] aspirin EC  81 mg Oral Daily   [MAR Hold] atorvastatin  40 mg Oral Daily   butamben-tetracaine-benzocaine       [MAR Hold] Chlorhexidine Gluconate Cloth  6 each Topical Daily   [MAR Hold] Chlorhexidine Gluconate Cloth  6 each Topical Q0600   [MAR Hold] cholecalciferol  2,000 Units Oral Daily   [MAR Hold] DULoxetine  60 mg Oral Daily   [MAR Hold] famotidine  10 mg Oral Daily   [MAR Hold] ferrous sulfate  325 mg Oral Q breakfast   [MAR Hold] hydrALAZINE  10 mg Oral TID   [MAR Hold] influenza vaccine adjuvanted  0.5 mL Intramuscular Tomorrow-1000   [MAR Hold] insulin aspart  0-6 Units Subcutaneous Q4H   [MAR Hold] isosorbide mononitrate  30 mg Oral Daily   [MAR Hold] midodrine  10 mg Oral Q T,Th,Sa-HD   [MAR Hold] mometasone-formoterol  2 puff Inhalation BID   [MAR Hold] nicotine  14 mg Transdermal Daily   [MAR Hold] sodium chloride flush  3 mL Intravenous Q12H   [MAR Hold] sodium chloride flush  3 mL Intravenous Q12H   [MAR Hold] sucralfate  1 g Oral BID   Continuous Infusions:  [MAR Hold] sodium chloride     sodium chloride     [MAR Hold] sodium chloride     [MAR Hold] anticoagulant sodium citrate     heparin 1,300 Units/hr (02/15/23 0737)   PRN Meds: [UXN Hold] sodium chloride, [MAR Hold] sodium chloride, [MAR Hold] acetaminophen, [MAR Hold] albuterol, [MAR Hold] alteplase, [MAR Hold]  anticoagulant sodium citrate, butamben-tetracaine-benzocaine, [MAR Hold] fluticasone, [MAR Hold] heparin, [MAR Hold] lidocaine (PF), [MAR Hold] lidocaine-prilocaine, [MAR Hold] midodrine, [MAR Hold] ondansetron (ZOFRAN) IV, [MAR Hold] pentafluoroprop-tetrafluoroeth, [MAR Hold] sodium chloride flush, [MAR Hold] sodium chloride flush   Vital Signs   Vitals:   02/14/23 2330 02/15/23 0335 02/15/23 0655 02/15/23 0700  BP:  (!) 90/58 128/70 128/73  Pulse:  97 (!) 102 98  Resp:   14 (!) 23  Temp:  97.8 F (36.6 C) 98 F (36.7 C)   TempSrc:  Oral Temporal   SpO2: 91% 92% 92% 91%  Weight:  113.7 kg    Height:        Intake/Output Summary (Last 24 hours) at 02/15/2023 0753 Last data filed at 02/15/2023 0345 Gross per 24 hour  Intake 584.66 ml  Output 1250 ml  Net -665.34 ml      02/15/2023    3:35 AM 02/14/2023    5:00 AM 02/13/2023    4:24 PM  Last 3 Weights  Weight (lbs) 250 lb 10.6 oz 250 lb 14.1 oz 254 lb 13.6 oz  Weight (kg) 113.7 kg 113.8 kg 115.6 kg      Telemetry  Overnight telemetry shows SR 80s, which I personally reviewed.    Physical Exam   Vitals:   02/14/23 2330 02/15/23 0335 02/15/23 0655 02/15/23 0700  BP:  (!) 90/58 128/70 128/73  Pulse:  97 (!) 102 98  Resp:   14 (!) 23  Temp:  97.8 F (36.6 C) 98 F (36.7 C)   TempSrc:  Oral Temporal   SpO2: 91% 92% 92% 91%  Weight:  113.7 kg    Height:        Intake/Output Summary (Last 24 hours) at 02/15/2023 0753 Last data filed at 02/15/2023 0345 Gross per 24 hour  Intake 584.66 ml  Output 1250 ml  Net -665.34 ml       02/15/2023    3:35 AM 02/14/2023    5:00 AM 02/13/2023    4:24 PM  Last 3 Weights  Weight (lbs) 250 lb 10.6 oz 250 lb 14.1 oz 254 lb 13.6 oz  Weight (kg) 113.7 kg 113.8 kg 115.6 kg    Body mass index is 35.97 kg/m.  General: Well nourished, well developed, in no acute distress Head: Atraumatic, normal size  Eyes: PEERLA, EOMI  Neck: Supple, JVD 10 to 12 cm of water Endocrine: No thryomegaly Cardiac:  Normal S1, S2; RRR; 2 out of 6 systolic ejection murmur, diastolic rumble noted Lungs: Diminished breath sounds bilaterally Abd: Soft, nontender, no hepatomegaly  Ext: No edema, pulses 2+ Musculoskeletal: No deformities, BUE and BLE strength normal and equal Skin: Warm and dry, no rashes   Neuro: Alert and oriented to person, place, time, and situation, CNII-XII grossly intact, no focal deficits  Psych: Normal mood and affect   Labs  High Sensitivity Troponin:   Recent Labs  Lab 02/12/23 0003 02/13/23 0002  TROPONINIHS 1,099* 1,089*     Cardiac EnzymesNo results for input(s): "TROPONINI" in the last 168 hours. No results for input(s): "TROPIPOC" in the last 168 hours.  Chemistry Recent Labs  Lab 02/12/23 0003 02/13/23 1017 02/14/23 0221 02/14/23 1417 02/14/23 1425 02/14/23 1430 02/14/23 1734 02/15/23 0629  NA 134* 136 134*   < > 136 137  --  133*  K 4.1 4.0 3.8   < > 3.8 3.5  --  3.9  CL 105 103 98  --   --   --   --  100  CO2 20* 20* 22  --   --   --   --  19*  GLUCOSE 84 134* 94  --   --   --   --  122*  BUN 50* 50* 34*  --   --   --   --  43*  CREATININE 3.26* 3.03* 2.38*  --   --   --  2.79* 2.80*  CALCIUM 8.3* 8.5* 8.3*  --   --   --   --  8.4*  PROT 6.3*  --   --   --   --   --   --   --   ALBUMIN 2.2*  --   --   --   --   --   --  2.3*  AST 33  --   --   --   --   --   --   --   ALT 26  --   --   --   --   --   --   --   ALKPHOS 87  --   --   --   --   --   --   --   BILITOT 0.6  --   --   --   --   --   --   --  GFRNONAA 19* 21* 28*  --   --   --  23* 23*  ANIONGAP 9 13 14   --   --   --   --  14   < > = values in this interval not displayed.    Hematology Recent Labs  Lab 02/14/23 0221 02/14/23 1417 02/14/23 1430 02/14/23 1734 02/15/23 0630  WBC 5.0  --   --  5.8 5.8  RBC 3.52*  --   --  3.68* 3.60*  HGB 10.4*   < > 10.5* 11.0* 10.3*  HCT 31.7*   < > 31.0* 33.4* 31.9*  MCV 90.1  --   --  90.8 88.6  MCH 29.5  --   --  29.9 28.6  MCHC 32.8  --   --   32.9 32.3  RDW 18.3*  --   --  18.2* 18.0*  PLT 171  --   --  187 183   < > = values in this interval not displayed.   BNP Recent Labs  Lab 02/12/23 0005  BNP 1,051.6*    DDimer No results for input(s): "DDIMER" in the last 168 hours.   Radiology  CARDIAC CATHETERIZATION  Result Date: 02/15/2023   Mid LM to Ost LAD lesion is 40% stenosed.   Ramus lesion is 100% stenosed.   Ost LAD to Prox LAD lesion is 80% stenosed.  Previously placed Mid LAD lesion is 100% stenosed.   Previously placed Ost Cx to Prox Cx stent of unknown type is  widely patent.   Prox Cx to Mid Cx lesion is 65% stenosed.   1st Mrg lesion is 50% stenosed.   Previously placed Prox RCA lesion is 40% stenosed -focal ISR.   Mid RCA lesion is 85% stenosed.   Mid RCA to Dist RCA lesion is 20% stenosed.  Dist RCA lesion is 20% stenosed.   SVG-RI graft was visualized by angiography and is large.  The graft exhibits no disease.   LIMA-LAD graft was visualized by angiography and is large.  The graft exhibits no disease.   ------------------------------------------------------------   Hemodynamic findings consistent with severe pulmonary hypertension.   There is moderate aortic valve stenosis.   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 The stent appears to be in the proximal to mid segment followed by a 85% stenosis in the mid RCA. -> Widely patent large LIMA to mid LAD with retrograde filling of the 2nd Diag branch; widely patent SVG-RI Right Heart Cath/Hemodynamics: RA P 13 mmHg; RVP-EDP 65/3-11 mmHg; PAP-mean 60/35-45 mmHg, PCWP 40  to 44 mmHg (V waves appeared more prominent), LV P-EDP 150/6-14 mmHg, AO P-MAP 114/64-82 mmHg Wedge-LVEDP gradient ~30 mmHg; AoV mean gradient 35 mmHg Ao sat 88%, PA sat 56%; (FICK) Cardiac Output-Index 6.76-2.94 RECOMMENDATIONS Optimization of medical and fluid management Will likely need staged imaging-guided PCI of the RCA with potential FFR guided PCI to the LCx as well => discussion with Dr. Lennie Odor, this will need to be done after having his dialysis fistula cleared and dialysis catheter removed (this would prevent having to hold Plavix). Bryan Lemma, MD  ECHOCARDIOGRAM COMPLETE  Result Date: 02/13/2023    ECHOCARDIOGRAM REPORT   Patient Name:   Dominic [name] Date of Exam: 02/13/2023 Medical Rec #:  [id number]      Height:       70.0 in Accession #:    [id number]     Weight:       258.8 lb Date of Birth:  27-Jun-1950      BSA:          2.329 m Patient Age:    72 years       BP:           124/76 mmHg Patient Gender: M              HR:           94 bpm. Exam Location:  Inpatient Procedure: 2D Echo, Color Doppler, Cardiac Doppler and Intracardiac            Opacification Agent Indications:    I44.7 LBBB  History:        Patient has prior history of Echocardiogram examinations, most                 recent 01/23/2022. CHF, Abnormal ECG, Defibrillator and Prior                 CABG, COPD, Endocarditis, Mitral Stenosis, Mitral Valve Disease                 and Aortic Valve Disease, Arrythmias:LBBB,                 Signs/Symptoms:Syncope; Risk Factors:Current Smoker and                 Hypertension. ESRD. MVR.                  Mitral Valve: 31 mm Edwards Sapien bioprosthetic valve valve is                 present in the mitral position. Procedure Date: 09/05/2015.  Sonographer:    Sheralyn Boatman RDCS Referring Phys: [id number] [facility]  Sonographer Comments: Technically difficult study due to poor echo windows. IMPRESSIONS  1. There is no left ventricular thrombus (Definity contrast was used). There is severe  septal-lateral dyssynchrony due to LBBB. Left ventricular ejection fraction, by estimation, is 35 to 40%. The left ventricle has moderately decreased function. The left ventricle demonstrates global hypokinesis. The left ventricular internal cavity size was mildly dilated. There is mild concentric left ventricular hypertrophy. Indeterminate diastolic filling due to E-A fusion.  2. Right ventricular systolic function is moderately reduced. The right ventricular size is moderately enlarged. There is moderately elevated pulmonary artery systolic pressure. The estimated right ventricular systolic pressure is 57.3 mmHg.  3. Left atrial size was severely dilated.  4. Right atrial size was moderately dilated.  5. The mitral valve has been repaired/replaced. No evidence of mitral valve regurgitation. Severe mitral stenosis. The mean mitral valve gradient is 18.0 mmHg with average heart rate of 95 bpm. There is a 31 mm Edwards Sapien bioprosthetic valve present  in the mitral position. Procedure Date: 09/05/2015. Echo findings are consistent with stenosis the mitral prosthesis.  6. Large pleural effusion.  7. Tricuspid valve regurgitation is moderate to severe.  8. The aortic valve is tricuspid. There is moderate calcification of the aortic valve. There is moderate thickening of the aortic valve. Aortic valve regurgitation is moderate. Moderate aortic valve stenosis. Aortic regurgitation PHT measures 319 msec. Aortic valve mean gradient  measures 23.3 mmHg. Aortic valve Vmax measures 3.21 m/s.  9. Aortic dilatation noted. There is mild dilatation of the ascending aorta, measuring 39 mm. There is mild dilatation of the aortic root, measuring 42 mm. 10. Hepatic vein flow reversal is seen is seen intermittently (likely with respiration, spirometry was not performed). The inferior vena cava is dilated in size with >50% respiratory variability, suggesting right atrial pressure of 8 mmHg. Comparison(s): The left ventricular function  is worsened. There is marked increase in mitral valve prosthesis gradients, which may be partly related to the increase in heart rate, but could also be due to prosthetic valve endocarditis. Pulmonary artery hypertension is worse, likley due to increase left heart pressures. Aortic stenosis is only slightly worse. 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 Consider TEE for evaluation for endocarditis of the mitral valve prosthesis. Consider a component of constrictive physiology (evaluate mitral flow and hepatic vein flow with spirometry). FINDINGS  Left Ventricle: There is no left ventricular thrombus (Definity contrast was used). There is severe septal-lateral dyssynchrony due to LBBB. Left ventricular ejection fraction, by estimation, is 35 to 40%. The left ventricle has moderately decreased function. The left ventricle demonstrates global hypokinesis. Definity contrast agent was given IV to delineate the left ventricular endocardial borders. The left ventricular internal cavity size was mildly dilated. There is mild concentric left ventricular hypertrophy. Abnormal (paradoxical) septal motion, consistent with left bundle branch block. Indeterminate diastolic filling due to E-A fusion. Right Ventricle: The right ventricular size is moderately enlarged. Right vetricular wall thickness was not well visualized. Right ventricular systolic function is moderately reduced. There is moderately elevated pulmonary artery systolic pressure. The tricuspid regurgitant velocity is 3.51 m/s, and with an assumed right atrial pressure of 8 mmHg, the estimated right ventricular systolic pressure is 57.3 mmHg. Left Atrium: Left atrial size was severely dilated. Right Atrium: Right atrial size was moderately dilated. Pericardium: There is no evidence of pericardial effusion. Mitral Valve: The mitral valve has been repaired/replaced. No evidence of mitral valve regurgitation. There  is a 31 mm Edwards Sapien bioprosthetic valve present in the mitral position. Procedure Date: 09/05/2015. 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 Severe mitral valve stenosis. MV peak gradient, 25.6 mmHg. The mean mitral valve gradient is 18.0 mmHg with average heart rate of 95 bpm. Tricuspid Valve: The tricuspid valve is grossly normal. Tricuspid valve regurgitation is moderate to severe. No evidence of tricuspid stenosis. Aortic Valve: The aortic valve is tricuspid. There is moderate calcification of the aortic valve. There is moderate thickening of the aortic valve. Aortic valve regurgitation is moderate. Aortic regurgitation PHT measures 319 msec. Moderate aortic stenosis is present. Aortic valve mean gradient measures 23.3 mmHg. Aortic valve peak gradient measures 41.1 mmHg. Aortic valve area, by VTI measures 1.26 cm. Pulmonic Valve: The pulmonic valve was grossly normal. Pulmonic valve regurgitation is not visualized. No evidence of pulmonic stenosis. Aorta: Aortic dilatation noted. There is mild dilatation of the ascending aorta, measuring 39 mm. There is mild dilatation of the aortic root, measuring 42 mm. Venous: Hepatic vein flow reversal is seen is seen intermittently (likely with respiration, spirometry was not performed). The inferior vena cava is dilated in size with greater than 50% respiratory variability, suggesting right atrial pressure of 8 mmHg. The inferior vena cava and the hepatic vein show a pattern of systolic flow reversal, suggestive of tricuspid regurgitation. IAS/Shunts: No atrial level shunt detected by color flow Doppler. Additional Comments: There is a  large pleural effusion.  LEFT VENTRICLE PLAX 2D LVIDd:         6.00 cm      Diastology LVIDs:         4.80 cm      LV e' medial:  3.48 cm/s LV PW:         1.30 cm      LV e' lateral: 7.40 cm/s LV IVS:        1.40 cm LVOT diam:     2.20 cm LV SV:         71 LV SV Index:   30 LVOT Area:     3.80 cm  LV  Volumes (MOD) LV vol d, MOD A2C: 142.5 ml LV vol d, MOD A4C: 162.0 ml LV vol s, MOD A2C: 87.3 ml LV vol s, MOD A4C: 101.4 ml LV SV MOD A2C:     55.2 ml LV SV MOD A4C:     162.0 ml LV SV MOD BP:      57.0 ml RIGHT VENTRICLE            IVC RV S prime:     6.31 cm/s  IVC diam: 2.70 cm TAPSE (M-mode): 1.0 cm LEFT ATRIUM             Index        RIGHT ATRIUM           Index LA diam:        5.20 cm 2.23 cm/m   RA Area:     16.40 cm LA Vol (A2C):   53.9 ml 23.14 ml/m  RA Volume:   43.90 ml  18.85 ml/m LA Vol (A4C):   70.0 ml 30.06 ml/m LA Biplane Vol: 65.2 ml 28.00 ml/m  AORTIC VALVE AV Area (Vmax):    1.27 cm AV Area (Vmean):   1.23 cm AV Area (VTI):     1.26 cm AV Vmax:           320.67 cm/s AV Vmean:          222.000 cm/s AV VTI:            0.559 m AV Peak Grad:      41.1 mmHg AV Mean Grad:      23.3 mmHg LVOT Vmax:         107.00 cm/s LVOT Vmean:        71.800 cm/s LVOT VTI:          0.186 m LVOT/AV VTI ratio: 0.33 AI PHT:            319 msec  AORTA Ao Root diam: 4.20 cm Ao Asc diam:  4.05 cm MITRAL VALVE             TRICUSPID VALVE MV Peak grad: 25.6 mmHg  TR Peak grad:   49.3 mmHg MV Mean grad: 18.0 mmHg  TR Vmax:        351.00 cm/s MV Vmax:      2.53 m/s MV Vmean:     204.5 cm/s SHUNTS                          Systemic VTI:  0.19 m                          Systemic Diam: 2.20 cm Rachelle Hora Croitoru MD Electronically signed by Thurmon Fair MD Signature Date/Time: 02/13/2023/2:17:28 PM    Final  Cardiac Studies  LHC/RHC 02/14/2023 Severe multivessel CAD: -> Distal LM 40%, ostial LAD 80% followed by short normal segment with SP and small to Diag branch then 100% occluded stent 100% ostial RI occlusion\ Widely patent ostial-proximal LCx stent with ~65% stenosis just distal to the stent and prior to OM/AVG bifurcation with trivial disease in the OM branches 40% ostial RCA with heavily calcified RCA (difficult to assess stent versus calcification. The stent appears to be in the proximal to mid segment  followed by a 85% stenosis in the mid RCA. -> Widely patent large LIMA to mid LAD with retrograde filling of the 2nd Diag branch; widely patent SVG-RI Right Heart Cath/Hemodynamics: RA P 13 mmHg; RVP-EDP 65/3-11 mmHg; PAP-mean 60/35-45 mmHg, PCWP 40 to 44 mmHg (V waves appeared more prominent), LV P-EDP 150/6-14 mmHg, AO P-MAP 114/64-82 mmHg Wedge-LVEDP gradient ~30 mmHg; AoV mean gradient 35 mmHg Ao sat 88%, PA sat 56%; (FICK) Cardiac Output-Index 6.76-2.94    RECOMMENDATIONS Optimization of medical and fluid management Will likely need staged imaging-guided PCI of the RCA with potential FFR guided PCI to the LCx as well => discussion with Dr. Lennie Odor, this will need to be done after having his dialysis fistula cleared and dialysis catheter removed (this would prevent having to hold Plavix).   Patient Profile  Rakwon Alfano is a 72 y.o. 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 Course complicated by acute systolic heart failure and volume overload. Also likely non-STEMI which is secondary.   Assessment & Plan   # Syncope # Concern for symptomatic bradycardia -Will need pacing support.  Unclear what that will be.  Either temporary pacemaker versus Micra leadless pacemaker device.  Ongoing discussions with the EP.  Hold beta-blocker. -A lot of this depends on vascular access for dialysis.  # Acute systolic heart failure, EF 35% # LV dyssynchrony # Left bundle branch block -CRT-D was removed last year due to bacteremia.  Not a candidate for reimplant per discussion with the EP. -EF drop is likely due to LV dyssynchrony. -No beta-blocker.  Ongoing discussions with EP regarding what is best to support him. -Continue Imdur and hydralazine.  Not a candidate for ACE/ARB/ARNI/MRA given ESRD. -Very volume up.   Right heart cath with wedge of 44.  HD today.  # Severe stenosis of bioprosthetic mitral valve # Moderate aortic stenosis/moderate to severe aortic insufficiency -Transesophageal echo today confirms severe bioprosthetic mitral valve stenosis.  Will need evaluation for TMVR.  Also with moderate aortic stenosis and moderate severe aortic insufficiency.  Combined he has severe aortic valvular heart disease.  Will need to be considered for possible TAVR. -His dialysis catheter is occluded.  Has an indwelling HD catheter.  Not a candidate for TAVR or TMVR until HD catheter was removed and fistula is in place.  I have reached out to nephrology and they will determine the plan for this.  He follows with an outside nephrology group.  # ESRD # Occluded right AV fistula # Indwelling right chest dialysis catheter -Management per nephrology.  His indwelling HD catheter problematic for pacing needs as well as valvular heart disease.  See discussion above.  Nephrology to reach out to IR to determine what needs to be done about this and the definitive plan.  # Non-STEMI # Severe RCA lesion -Troponins were elevated but likely related  to decompensated heart failure.  Left heart catheterization shows she does have a severe stenosis in the mid RCA as well as a moderate stenosis in an OM branch.  Patent LIMA to LAD.  Patent SVG to ramus.  Plan is to manage this medically.  He needs his dialysis access resolved.  I do not want to delay this with PCI.  Suspect he will need PCI prior to intervention of valvular heart disease.  Again for now we will manage this medically. -Continue aspirin 81 mg daily.  Continue high intensity statin.  No beta-blocker due to symptomatic bradycardia. -He will need staged PCI to the mid RCA.  Suspect he will just need invasive FFR of the OM branch.  # Paroxysmal atrial fibrillation -Maintaining sinus rhythm.  Continue amiodarone drip. -Currently on heparin drip.  Will discuss with the EP  what they prefer.  Would like to get him back on his DOAC as we are able.  # Severe COPD -Home inhalers  # Type 2 diabetes -Lantus 20 units nightly.  Sliding scale insulin.  # FEN -No intravenous fluids -Code: Full -Diet: Heart healthy -Disposition: 1 to 2 days prior to discharge.   For questions or updates, please contact [facility] Please consult [email or web address] for contact info under        Signed, Gerri Spore T. Flora Lipps, MD, [facility] [place]  [facility]  02/15/2023 7:53 AM

## 2023-02-15 NOTE — Progress Notes (Signed)
  Echocardiogram Echocardiogram Transesophageal has been performed.  Milda Smart 02/15/2023, 8:57 AM

## 2023-02-15 NOTE — Progress Notes (Signed)
Pt brought to cath lab holding, Bay 21, IV Heparin gtt infusing at 1300 units/hour, switched from Left EJ site to LFA IV site,  NS infusing into Left EJ, safety maintained

## 2023-02-15 NOTE — Progress Notes (Signed)
Received patient in bed to unit.  Alert and oriented.  Informed consent signed and in chart.   TX duration:3.5 hours  Patient tolerated well.  Transported back to the room  Alert, without acute distress.  Hand-off given to patient's nurse.   Access used: Right HD Cath Access issues: Cartridge needed to be changed due to cartridge clotting off.  Reversed Atrial to Venous and Venous to Atrial and patient progressed with treatment  Total UF removed: Medication(s) given: Tylenol, see MAR   02/15/23 1437  Vitals  Temp 98 F (36.7 C)  Temp Source Oral  BP 112/68  MAP (mmHg) 81  Pulse Rate 86  ECG Heart Rate 85  Resp 12  MEWS COLOR  MEWS Score Color Green  Oxygen Therapy  SpO2 94 %  O2 Device Nasal Cannula  O2 Flow Rate (L/min) 3 L/min  MEWS Score  MEWS Temp 0  MEWS Systolic 0  MEWS Pulse 0  MEWS RR 1  MEWS LOC 0  MEWS Score 1     Stacie Glaze LPN [facility]

## 2023-02-15 NOTE — Plan of Care (Signed)

## 2023-02-15 NOTE — Transfer of Care (Signed)
Immediate Anesthesia Transfer of Care Note  Patient: Dominic [name]  Procedure(s) Performed: TRANSESOPHAGEAL ECHOCARDIOGRAM  Patient Location: Cath Lab  Anesthesia Type:MAC  Level of Consciousness: drowsy and patient cooperative  Airway & Oxygen Therapy: Patient Spontanous Breathing and Patient connected to nasal cannula oxygen  Post-op Assessment: Report given to RN and Post -op Vital signs reviewed and stable  Post vital signs: Reviewed and stable  Last Vitals:  Vitals Value Taken Time  BP 102/69 02/15/23 0845  Temp    Pulse 78 02/15/23 0846  Resp 15 02/15/23 0846  SpO2 93 % 02/15/23 0846  Vitals shown include unfiled device data.  Last Pain:  Vitals:   02/15/23 0655  TempSrc: Temporal  PainSc: 0-No pain         Complications: No notable events documented.

## 2023-02-15 NOTE — Consult Note (Signed)
Chief Complaint: Patient was seen in consultation today for right upper arm dialysis graft evaluation/intervention at the request of [name] [facility]  Supervising Physician: [name]  Patient Status: Dominic [name] [facility] - In-pt  History of Present Illness: Dominic [name] is a 72 y.o. male   FULL Code status per pt ESRD Severe stenosis prosthetic mitral valve Cards was hoping to get Rt internal jugular HD catheter out soon Pt has existing Rt upper arm graft- placed 2022 per pt Attempted to use only once July 2024--- unable to access per pt Has used Rt internal jugular tunn cath since then; 3x/week  Rt upper arm graft clotted per chart I am able to feel good pulse in this graft; no thrill Pt was scheduled for OP IR declot next week Nephro asking IR to see as IP to see if we can see earlier.  IR will plan for dialysis graft evaluation with possible intervention 9/6 Pt is in dialysis now   Past Medical History:  Diagnosis Date   Biventricular ICD (implantable cardioverter-defibrillator) - [facility] 09/05/2015   Jul 01, 2015 [facility] Dynagen X4   CAD (coronary artery disease) 09/05/2015   CABG 2015 PCI-BMS ostial LCX Sept 2015 04/05/2015 cath 50% distal LM, 90% prox LAD, old stent prox LAD 70% ISR, patent LIMA-LAD, 80% prox RCA PCI-DES x2 RCA and LCX    Chronic systolic CHF (congestive heart failure) (HCC) 09/05/2015   a. RHC 10/29/15: PCW 8, CI 2.2. Overall low filling presssures. Mean PA 20; b. echo 10/30/15: EF 45-50%, not tech suff to allwo for LV diastolic fxn, mild AI, nl appearing MVR   CKD stage 4 due to type 2 diabetes mellitus (HCC) 09/05/2015   Diabetes mellitus, type 2 (HCC) 09/05/2015   Essential hypertension 09/05/2015   History of mitral valve replacement with bioprosthetic valve 09/05/2015   31 mm Edwards 2015, Dr. Arvilla Market   Postoperative atrial fibrillation (HCC) 09/05/2015    Past Surgical History:  Procedure Laterality Date   CARDIAC CATHETERIZATION N/A 10/29/2015   Procedure: Right Heart  Cath;  Surgeon: Dolores Patty, MD;  Location: [facility] INVASIVE CV LAB;  Service: Cardiovascular;  Laterality: N/A;   CARDIAC VALVE REPLACEMENT  2015   31mm Edwards bioprosthesis   CORONARY ANGIOPLASTY     CORONARY ARTERY BYPASS GRAFT  2015   [facility], Dr. Arvilla Market.   RIGHT/LEFT HEART CATH AND CORONARY/GRAFT ANGIOGRAPHY N/A 02/14/2023   Procedure: RIGHT/LEFT HEART CATH AND CORONARY/GRAFT ANGIOGRAPHY;  Surgeon: Marykay Lex, MD;  Location: [facility] INVASIVE CV LAB;  Service: Cardiovascular;  Laterality: N/A;   TEE WITHOUT CARDIOVERSION N/A 04/26/2021   Procedure: TRANSESOPHAGEAL ECHOCARDIOGRAM (TEE);  Surgeon: Chrystie Nose, MD;  Location: [facility] ENDOSCOPY;  Service: Cardiovascular;  Laterality: N/A;    Allergies: Hydrochlorothiazide, Amoxicillin-pot clavulanate, Clindamycin, Lisinopril, Meloxicam, Topiramate, and Tamsulosin  Medications: Prior to Admission medications   Medication Sig Start Date End Date Taking? Authorizing Provider  acetaminophen (TYLENOL) 500 MG tablet Take 500 mg by mouth every 6 (six) hours as needed for moderate pain. 10/26/20  Yes [provider]  albuterol (VENTOLIN HFA) 108 (90 Base) MCG/ACT inhaler Inhale 1-2 puffs into the lungs every 4 (four) hours as needed. 11/23/22  Yes [provider]  allopurinol (ZYLOPRIM) 100 MG tablet Take 100 mg by mouth daily. 11/27/22  Yes [provider]  amiodarone (PACERONE) 200 MG tablet Take 1 tablet (200 mg total) by mouth daily. 08/17/22  Yes Croitoru, Mihai, MD  apixaban (ELIQUIS) 5 MG TABS tablet Take 5 mg by mouth in  the morning and at bedtime. 12/23/21  Yes [provider]  atorvastatin (LIPITOR) 40 MG tablet Take 40 mg by mouth daily.   Yes [provider]  B Complex-C (SUPER B COMPLEX PO) Take 1 capsule by mouth daily.   Yes [provider]  Cholecalciferol (VITAMIN D) 50 MCG (2000 UT) tablet Take 2,000 Units by mouth daily.   Yes [provider]  DULoxetine (CYMBALTA) 60 MG  capsule Take 60 mg by mouth daily.    Yes [provider]  famotidine (PEPCID) 20 MG tablet Take 10 mg by mouth daily.   Yes [provider]  ferrous sulfate 325 (65 FE) MG tablet Take 325 mg by mouth daily with breakfast.   Yes [provider]  fluticasone (FLONASE) 50 MCG/ACT nasal spray Place 1 spray into both nostrils daily as needed for allergies.   Yes [provider]  fluticasone-salmeterol (ADVAIR HFA) 45-21 MCG/ACT inhaler Inhale 2 puffs into the lungs 2 (two) times daily. 04/25/22 04/25/23 Yes [provider]  gabapentin (NEURONTIN) 100 MG capsule Take 100 mg by mouth 2 (two) times daily. 03/15/22  Yes [provider]  hydrALAZINE (APRESOLINE) 10 MG tablet Take 1 tablet (10 mg total) by mouth 3 (three) times daily. 12/18/22  Yes Croitoru, Mihai, MD  HYDROcodone-acetaminophen (NORCO/VICODIN) 5-325 MG tablet Take 1 tablet by mouth every 6 (six) hours as needed for moderate pain. 09/12/19  Yes [provider]  hydrocortisone cream 1 % Apply 1 application  topically daily as needed for itching.   Yes [provider]  INCRUSE ELLIPTA 62.5 MCG/ACT AEPB Inhale 1 puff into the lungs daily.   Yes [provider]  isosorbide mononitrate (IMDUR) 60 MG 24 hr tablet Take 1 tablet (60 mg total) by mouth daily. 11/14/22 02/14/23 Yes Meng, Wynema Birch, PA  LANTUS SOLOSTAR 100 UNIT/ML Solostar Pen Inject 30-40 Units into the skin 2 (two) times daily. Take 40 units in the morning and 30 units in the evening 03/22/21  Yes [provider]  metoprolol succinate (TOPROL XL) 25 MG 24 hr tablet Take 1.5 tablets (37.5 mg total) by mouth daily. 02/08/22  Yes Croitoru, Mihai, MD  nitroGLYCERIN (NITROSTAT) 0.4 MG SL tablet Place 1 tablet (0.4 mg total) under the tongue every 5 (five) minutes as needed for chest pain. 06/19/22  Yes Croitoru, Mihai, MD  polyethylene glycol powder (GLYCOLAX/MIRALAX) 17 GM/SCOOP powder Take 1 Container by mouth daily as  needed for moderate constipation. 03/24/20  Yes [provider]  potassium chloride SA (KLOR-CON) 20 MEQ tablet TAKE 2 TABLETS BY MOUTH THREE TIMES A DAY 12/08/20  Yes Croitoru, Mihai, MD  sucralfate (CARAFATE) 1 g tablet Take 1 g by mouth 3 (three) times daily. 02/17/21  Yes [provider]  tiZANidine (ZANAFLEX) 2 MG tablet Take 2 mg by mouth every 8 (eight) hours as needed for muscle spasms. 08/13/19  Yes [provider]  torsemide (DEMADEX) 20 MG tablet Take 3 tablets (60 mg total) by mouth in the morning and at bedtime. 07/03/22  Yes Croitoru, Mihai, MD  glipiZIDE (GLUCOTROL XL) 2.5 MG 24 hr tablet Take 2.5 mg by mouth daily. Patient not taking: Reported on 02/14/2023 10/12/21   [provider]     Family History  Problem Relation Age of Onset   Hypertension Father    Hyperlipidemia Father     Social History   Socioeconomic History   Marital status: Married    Spouse name: Not on file   Number of children:  0   Years of education: Not on file   Highest education level: Not on file  Occupational History   Not on file  Tobacco Use   Smoking status: Some Days    Current packs/day: 0.50    Average packs/day: 0.5 packs/day for 40.0 years (20.0 ttl pk-yrs)    Types: Cigarettes   Smokeless tobacco: Never   Tobacco comments:    01/02/2022- Patient smokes about 1/2 half pack daily    06/19/2022- Patient smokes about 10 cigarettes a day  Substance and Sexual Activity   Alcohol use: No    Alcohol/week: 0.0 standard drinks of alcohol   Drug use: No   Sexual activity: Not Currently  Other Topics Concern   Not on file  Social History Narrative   Not on file   Social Determinants of Health   Financial Resource Strain: Medium Risk (02/11/2023)   Received from [facility]   Overall Financial Resource Strain (CARDIA)    Difficulty of Paying Living Expenses: Somewhat hard  Food Insecurity: No Food Insecurity (02/12/2023)   Hunger Vital Sign    Worried  About Running Out of Food in the Last Year: Never true    Ran Out of Food in the Last Year: Never true  Transportation Needs: No Transportation Needs (02/12/2023)   PRAPARE - Administrator, Civil Service (Medical): No    Lack of Transportation (Non-Medical): No  Physical Activity: Inactive (02/11/2023)   Received from [facility]   Exercise Vital Sign    Days of Exercise per Week: 0 days    Minutes of Exercise per Session: 0 min  Stress: No Stress Concern Present (02/11/2023)   Received from [facility] of Occupational Health - Occupational Stress Questionnaire    Feeling of Stress : Only a little  Social Connections: Moderately Isolated (02/11/2023)   Received from [facility]   Social Connection and Isolation Panel [NHANES]    Frequency of Communication with Friends and Family: Once a week    Frequency of Social Gatherings with Friends and Family: Once a week    Attends Religious Services: More than 4 times per year    Active Member of [employer] or Organizations: No    Attends Banker Meetings: Never    Marital Status: Married    Review of Systems: A 12 point ROS discussed and pertinent positives are indicated in the HPI above.  All other systems are negative.   Vital Signs: BP 105/62 (BP Location: Left Arm)   Pulse 87   Temp 97.6 F (36.4 C) (Oral)   Resp 18   Ht 5\' 10"  (1.778 m)   Wt 254 lb 6.6 oz (115.4 kg) Comment: bed  SpO2 94%   BMI 36.50 kg/m   Advance Care Plan: The advanced care plan/surrogate decision maker was discussed at the time of visit and documented in the medical record.    Physical Exam Vitals reviewed.  HENT:     Mouth/Throat:     Mouth: Mucous membranes are moist.  Cardiovascular:     Rate and Rhythm: Normal rate and regular rhythm.     Heart sounds: Normal heart sounds.  Pulmonary:     Effort: Pulmonary effort is normal.     Breath sounds: Normal breath sounds. No wheezing.  Abdominal:      Palpations: Abdomen is soft.  Musculoskeletal:        General: Normal range of motion.  Comments: Right upper arm dialysis graft good pulse No thrill  Skin:    General: Skin is warm.  Neurological:     Mental Status: He is alert and oriented to person, place, and time.  Psychiatric:        Behavior: Behavior normal.     Imaging: EP STUDY  Result Date: 02/15/2023 See surgical note for result.  CARDIAC CATHETERIZATION  Result Date: 02/15/2023   Mid LM to Ost LAD lesion is 40% stenosed.   Ramus lesion is 100% stenosed.   Ost LAD to Prox LAD lesion is 80% stenosed.  Previously placed Mid LAD lesion is 100% stenosed.   Previously placed Ost Cx to Prox Cx stent of unknown type is  widely patent.   Prox Cx to Mid Cx lesion is 65% stenosed.   1st Mrg lesion is 50% stenosed.   Previously placed Prox RCA lesion is 40% stenosed -focal ISR.   Mid RCA lesion is 85% stenosed.   Mid RCA to Dist RCA lesion is 20% stenosed.  Dist RCA lesion is 20% stenosed.   SVG-RI graft was visualized by angiography and is large.  The graft exhibits no disease.   LIMA-LAD graft was visualized by angiography and is large.  The graft exhibits no disease.   ------------------------------------------------------------   Hemodynamic findings consistent with severe pulmonary hypertension.   There is moderate aortic valve stenosis.   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 The stent appears to be in the proximal to mid segment followed by a 85% stenosis in the mid RCA. -> Widely patent large LIMA to mid LAD with  retrograde filling of the 2nd Diag branch; widely patent SVG-RI Right Heart Cath/Hemodynamics: RA P 13 mmHg; RVP-EDP 65/3-11 mmHg; PAP-mean 60/35-45 mmHg, PCWP 40 to 44 mmHg (V waves appeared more prominent), LV P-EDP 150/6-14 mmHg, AO P-MAP 114/64-82 mmHg Wedge-LVEDP gradient ~30 mmHg; AoV mean gradient 35 mmHg Ao sat 88%, PA sat 56%; (FICK) Cardiac Output-Index 6.76-2.94 RECOMMENDATIONS Optimization of medical and fluid management Will likely need staged imaging-guided PCI of the RCA with potential FFR guided PCI to the LCx as well => discussion with Dr. Lennie Odor, this will need to be done after having his dialysis fistula cleared and dialysis catheter removed (this would prevent having to hold Plavix). Bryan Lemma, MD  ECHOCARDIOGRAM COMPLETE  Result Date: 02/13/2023    ECHOCARDIOGRAM REPORT   Patient Name:   RAUN KEIL Date of Exam: 02/13/2023 Medical Rec #:  [id number]      Height:       70.0 in Accession #:    [id number]     Weight:       258.8 lb Date of Birth:  02-06-1951      BSA:          2.329 m Patient Age:    72 years       BP:           124/76 mmHg Patient Gender: M              HR:           94  bpm. Exam Location:  Inpatient Procedure: 2D Echo, Color Doppler, Cardiac Doppler and Intracardiac            Opacification Agent Indications:    I44.7 LBBB  History:        Patient has prior history of Echocardiogram examinations, most                 recent 01/23/2022. CHF, Abnormal ECG, Defibrillator and Prior                 CABG, COPD, Endocarditis, Mitral Stenosis, Mitral Valve Disease                 and Aortic Valve Disease, Arrythmias:LBBB,                 Signs/Symptoms:Syncope; Risk Factors:Current Smoker and                 Hypertension. ESRD. MVR.                  Mitral Valve: 31 mm Edwards Sapien bioprosthetic valve valve is                 present in the mitral position. Procedure Date: 09/05/2015.  Sonographer:    Sheralyn Boatman RDCS Referring Phys: [id number] [facility]  Sonographer  Comments: Technically difficult study due to poor echo windows. IMPRESSIONS  1. There is no left ventricular thrombus (Definity contrast was used). There is severe septal-lateral dyssynchrony due to LBBB. Left ventricular ejection fraction, by estimation, is 35 to 40%. The left ventricle has moderately decreased function. The left ventricle demonstrates global hypokinesis. The left ventricular internal cavity size was mildly dilated. There is mild concentric left ventricular hypertrophy. Indeterminate diastolic filling due to E-A fusion.  2. Right ventricular systolic function is moderately reduced. The right ventricular size is moderately enlarged. There is moderately elevated pulmonary artery systolic pressure. The estimated right ventricular systolic pressure is 57.3 mmHg.  3. Left atrial size was severely dilated.  4. Right atrial size was moderately dilated.  5. The mitral valve has been repaired/replaced. No evidence of mitral valve regurgitation. Severe mitral stenosis. The mean mitral valve gradient is 18.0 mmHg with average heart rate of 95 bpm. There is a 31 mm Edwards Sapien bioprosthetic valve present  in the mitral position. Procedure Date: 09/05/2015. Echo findings are consistent with stenosis the mitral prosthesis.  6. Large pleural effusion.  7. Tricuspid valve regurgitation is moderate to severe.  8. The aortic valve is tricuspid. There is moderate calcification of the aortic valve. There is moderate thickening of the aortic valve. Aortic valve regurgitation is moderate. Moderate aortic valve stenosis. Aortic regurgitation PHT measures 319 msec. Aortic valve mean gradient measures 23.3 mmHg. Aortic valve Vmax measures 3.21 m/s.  9. Aortic dilatation noted. There is mild dilatation of the ascending aorta, measuring 39 mm. There is mild dilatation of the aortic root, measuring 42 mm. 10. Hepatic vein flow reversal is seen is seen intermittently (likely with respiration, spirometry was not performed).  The inferior vena cava is dilated in size with >50% respiratory variability, suggesting right atrial pressure of 8 mmHg. Comparison(s): The left ventricular function is worsened. There is marked increase in mitral valve prosthesis gradients, which may be partly related to the increase in heart rate, but could also be due to prosthetic valve endocarditis. Pulmonary artery hypertension is worse, likley due to increase left heart pressures. Aortic stenosis is only slightly worse. 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 Consider TEE for evaluation for endocarditis of the mitral valve prosthesis. Consider a component of constrictive physiology (evaluate mitral flow and hepatic vein flow with spirometry). FINDINGS  Left Ventricle: There is no left ventricular thrombus (Definity contrast was used). There is severe septal-lateral dyssynchrony due to LBBB. Left ventricular ejection fraction, by estimation, is 35 to 40%. The left ventricle has moderately decreased function. The left ventricle demonstrates global hypokinesis. Definity contrast agent was given IV to delineate the left ventricular endocardial borders. The left ventricular internal cavity size was mildly dilated. There is mild concentric left ventricular hypertrophy. Abnormal (paradoxical) septal motion, consistent with left bundle branch block. Indeterminate diastolic filling due to E-A fusion. Right Ventricle: The right ventricular size is moderately enlarged. Right vetricular wall thickness was not well visualized. Right ventricular systolic function is moderately reduced. There is moderately elevated pulmonary artery systolic pressure. The tricuspid regurgitant velocity is 3.51 m/s, and with an assumed right atrial pressure of 8 mmHg, the estimated right ventricular systolic pressure is 57.3 mmHg. Left Atrium: Left atrial size was severely dilated. Right Atrium: Right atrial size was moderately dilated.  Pericardium: There is no evidence of pericardial effusion. Mitral Valve: The mitral valve has been repaired/replaced. No evidence of mitral valve regurgitation. There is a 31 mm Edwards Sapien bioprosthetic valve present in the mitral position. Procedure Date: 09/05/2015. 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 Severe mitral valve stenosis. MV peak gradient, 25.6 mmHg. The mean mitral valve gradient is 18.0 mmHg with average heart rate of 95 bpm. Tricuspid Valve: The tricuspid valve is grossly normal. Tricuspid valve regurgitation is moderate to severe. No evidence of tricuspid stenosis. Aortic Valve: The aortic valve is tricuspid. There is moderate calcification of the aortic valve. There is moderate thickening of the aortic valve. Aortic valve regurgitation is moderate. Aortic regurgitation PHT measures 319 msec. Moderate aortic stenosis is present. Aortic valve mean gradient measures 23.3 mmHg. Aortic valve peak gradient measures 41.1 mmHg. Aortic valve area, by VTI measures 1.26 cm. Pulmonic Valve: The pulmonic valve was grossly normal. Pulmonic valve regurgitation is not visualized. No evidence of pulmonic stenosis. Aorta: Aortic dilatation noted. There is mild dilatation of the ascending aorta, measuring 39 mm. There is mild dilatation of the aortic root, measuring 42 mm. Venous: Hepatic vein flow reversal is seen is seen intermittently (likely with respiration, spirometry was not performed). The inferior vena cava is dilated in size with greater than 50% respiratory variability, suggesting right atrial pressure of 8 mmHg. The inferior vena cava and the hepatic vein show a pattern of systolic flow reversal, suggestive of tricuspid regurgitation. IAS/Shunts: No atrial level shunt detected by color flow Doppler. Additional Comments: There is a large pleural effusion.  LEFT VENTRICLE PLAX 2D LVIDd:         6.00 cm      Diastology LVIDs:         4.80 cm      LV e' medial:  3.48 cm/s LV  PW:         1.30 cm      LV e' lateral: 7.40 cm/s LV IVS:        1.40 cm LVOT diam:     2.20 cm LV SV:         71 LV SV Index:   30 LVOT Area:     3.80 cm  LV Volumes (MOD) LV vol d, MOD A2C: 142.5 ml LV vol d, MOD A4C: 162.0 ml LV vol  s, MOD A2C: 87.3 ml LV vol s, MOD A4C: 101.4 ml LV SV MOD A2C:     55.2 ml LV SV MOD A4C:     162.0 ml LV SV MOD BP:      57.0 ml RIGHT VENTRICLE            IVC RV S prime:     6.31 cm/s  IVC diam: 2.70 cm TAPSE (M-mode): 1.0 cm LEFT ATRIUM             Index        RIGHT ATRIUM           Index LA diam:        5.20 cm 2.23 cm/m   RA Area:     16.40 cm LA Vol (A2C):   53.9 ml 23.14 ml/m  RA Volume:   43.90 ml  18.85 ml/m LA Vol (A4C):   70.0 ml 30.06 ml/m LA Biplane Vol: 65.2 ml 28.00 ml/m  AORTIC VALVE AV Area (Vmax):    1.27 cm AV Area (Vmean):   1.23 cm AV Area (VTI):     1.26 cm AV Vmax:           320.67 cm/s AV Vmean:          222.000 cm/s AV VTI:            0.559 m AV Peak Grad:      41.1 mmHg AV Mean Grad:      23.3 mmHg LVOT Vmax:         107.00 cm/s LVOT Vmean:        71.800 cm/s LVOT VTI:          0.186 m LVOT/AV VTI ratio: 0.33 AI PHT:            319 msec  AORTA Ao Root diam: 4.20 cm Ao Asc diam:  4.05 cm MITRAL VALVE             TRICUSPID VALVE MV Peak grad: 25.6 mmHg  TR Peak grad:   49.3 mmHg MV Mean grad: 18.0 mmHg  TR Vmax:        351.00 cm/s MV Vmax:      2.53 m/s MV Vmean:     204.5 cm/s SHUNTS                          Systemic VTI:  0.19 m                          Systemic Diam: 2.20 cm Rachelle Hora Croitoru MD Electronically signed by Thurmon Fair MD Signature Date/Time: 02/13/2023/2:17:28 PM    Final    DG CHEST PORT 1 VIEW  Result Date: 02/12/2023 CLINICAL DATA:  CHF. EXAM: PORTABLE CHEST 1 VIEW COMPARISON:  12/27/2022 FINDINGS: Right internal jugular catheter tip overlies the atrial caval junction. Heart size upper normal, prosthetic cardiac valve. Post median sternotomy. Worsening pulmonary edema. Suspected bilateral pleural effusions, left greater than  right. No pneumothorax. IMPRESSION: Worsening pulmonary edema. Suspected bilateral pleural effusions, left greater than right. Electronically Signed   By: Narda Rutherford M.D.   On: 02/12/2023 23:55    Labs:  CBC: Recent Labs    02/13/23 1017 02/14/23 0221 02/14/23 1417 02/14/23 1425 02/14/23 1430 02/14/23 1734 02/15/23 0630  WBC 6.8 5.0  --   --   --  5.8 5.8  HGB 10.9* 10.4*   < > 10.9* 10.5*  11.0* 10.3*  HCT 34.2* 31.7*   < > 32.0* 31.0* 33.4* 31.9*  PLT 176 171  --   --   --  187 183   < > = values in this interval not displayed.    COAGS: Recent Labs    06/28/22 1347 02/12/23 0003 02/13/23 1742 02/14/23 0617 02/15/23 0630  INR 1.2 1.2  --   --   --   APTT  --  33 92* 110* 66*    BMP: Recent Labs    02/12/23 0003 02/13/23 1017 02/14/23 0221 02/14/23 1417 02/14/23 1418 02/14/23 1425 02/14/23 1430 02/14/23 1734 02/15/23 0629  NA 134* 136 134*   < > 136 136 137  --  133*  K 4.1 4.0 3.8   < > 3.8 3.8 3.5  --  3.9  CL 105 103 98  --   --   --   --   --  100  CO2 20* 20* 22  --   --   --   --   --  19*  GLUCOSE 84 134* 94  --   --   --   --   --  122*  BUN 50* 50* 34*  --   --   --   --   --  43*  CALCIUM 8.3* 8.5* 8.3*  --   --   --   --   --  8.4*  CREATININE 3.26* 3.03* 2.38*  --   --   --   --  2.79* 2.80*  GFRNONAA 19* 21* 28*  --   --   --   --  23* 23*   < > = values in this interval not displayed.    LIVER FUNCTION TESTS: Recent Labs    08/17/22 1002 02/12/23 0003 02/15/23 0629  BILITOT 0.2 0.6  --   AST 55* 33  --   ALT 67* 26  --   ALKPHOS 157* 87  --   PROT 7.1 6.3*  --   ALBUMIN 3.7* 2.2* 2.3*    TUMOR MARKERS: No results for input(s): "AFPTM", "CEA", "CA199", "CHROMGRNA" in the last 8760 hours.  Assessment and Plan:  Right upper arm dialysis graft evaluation/intervention in IR 9/6 Risks and benefits discussed with the patient including, but not limited to bleeding, infection, vascular injury, pulmonary embolism, need for  tunneled HD catheter placement or even death.  All of the patient's questions were answered, patient is agreeable to proceed. Consent signed and in chart.  Thank you for this interesting consult.  I greatly enjoyed meeting Bub Falkowitz and look forward to participating in their care.  A copy of this report was sent to the requesting provider on this date.  Electronically Signed: Robet Leu, PA-C 02/15/2023, 11:31 AM   I spent a total of 40 Minutes    in face to face in clinical consultation, greater than 50% of which was counseling/coordinating care for right arm dialysis graft evaluation

## 2023-02-15 NOTE — Anesthesia Postprocedure Evaluation (Signed)
Anesthesia Post Note  Patient: Dominic [name]  Procedure(s) Performed: TRANSESOPHAGEAL ECHOCARDIOGRAM     Patient location during evaluation: PACU Anesthesia Type: MAC Level of consciousness: awake Pain management: pain level controlled Vital Signs Assessment: post-procedure vital signs reviewed and stable Respiratory status: spontaneous breathing, nonlabored ventilation and respiratory function stable Cardiovascular status: stable and blood pressure returned to baseline Postop Assessment: no apparent nausea or vomiting Anesthetic complications: no   No notable events documented.  Last Vitals:  Vitals:   02/15/23 0856 02/15/23 0906  BP: 107/65 110/67  Pulse: 81 84  Resp: 15 16  Temp:    SpO2: 94% 93%    Last Pain:  Vitals:   02/15/23 0906  TempSrc:   PainSc: 0-No pain                 Linton Rump

## 2023-02-16 ENCOUNTER — Inpatient Hospital Stay (HOSPITAL_COMMUNITY): Payer: No Typology Code available for payment source

## 2023-02-16 ENCOUNTER — Encounter (HOSPITAL_COMMUNITY): Payer: Self-pay | Admitting: Internal Medicine

## 2023-02-16 DIAGNOSIS — R001 Bradycardia, unspecified: Secondary | ICD-10-CM | POA: Diagnosis not present

## 2023-02-16 DIAGNOSIS — I5021 Acute systolic (congestive) heart failure: Secondary | ICD-10-CM | POA: Diagnosis not present

## 2023-02-16 DIAGNOSIS — I214 Non-ST elevation (NSTEMI) myocardial infarction: Secondary | ICD-10-CM | POA: Diagnosis not present

## 2023-02-16 DIAGNOSIS — N186 End stage renal disease: Secondary | ICD-10-CM | POA: Diagnosis not present

## 2023-02-16 HISTORY — PX: IR DIALY SHUNT INTRO NEEDLE/INTRACATH INITIAL W/IMG RIGHT: IMG6115

## 2023-02-16 LAB — HEPARIN LEVEL (UNFRACTIONATED): Heparin Unfractionated: 0.42 [IU]/mL (ref 0.30–0.70)

## 2023-02-16 LAB — CBC
HCT: 33.6 % — ABNORMAL LOW (ref 39.0–52.0)
Hemoglobin: 11 g/dL — ABNORMAL LOW (ref 13.0–17.0)
MCH: 28.6 pg (ref 26.0–34.0)
MCHC: 32.7 g/dL (ref 30.0–36.0)
MCV: 87.3 fL (ref 80.0–100.0)
Platelets: 196 10*3/uL (ref 150–400)
RBC: 3.85 MIL/uL — ABNORMAL LOW (ref 4.22–5.81)
RDW: 18 % — ABNORMAL HIGH (ref 11.5–15.5)
WBC: 5.8 10*3/uL (ref 4.0–10.5)
nRBC: 0 % (ref 0.0–0.2)

## 2023-02-16 LAB — BASIC METABOLIC PANEL
Anion gap: 15 (ref 5–15)
BUN: 27 mg/dL — ABNORMAL HIGH (ref 8–23)
CO2: 24 mmol/L (ref 22–32)
Calcium: 8.6 mg/dL — ABNORMAL LOW (ref 8.9–10.3)
Chloride: 96 mmol/L — ABNORMAL LOW (ref 98–111)
Creatinine, Ser: 2.48 mg/dL — ABNORMAL HIGH (ref 0.61–1.24)
GFR, Estimated: 27 mL/min — ABNORMAL LOW (ref >60)
Glucose, Bld: 139 mg/dL — ABNORMAL HIGH (ref 70–99)
Potassium: 3.6 mmol/L (ref 3.5–5.1)
Sodium: 135 mmol/L (ref 135–145)

## 2023-02-16 LAB — GLUCOSE, CAPILLARY
Glucose-Capillary: 110 mg/dL — ABNORMAL HIGH (ref 70–99)
Glucose-Capillary: 111 mg/dL — ABNORMAL HIGH (ref 70–99)
Glucose-Capillary: 138 mg/dL — ABNORMAL HIGH (ref 70–99)
Glucose-Capillary: 138 mg/dL — ABNORMAL HIGH (ref 70–99)
Glucose-Capillary: 149 mg/dL — ABNORMAL HIGH (ref 70–99)
Glucose-Capillary: 156 mg/dL — ABNORMAL HIGH (ref 70–99)

## 2023-02-16 LAB — APTT: aPTT: 62 s — ABNORMAL HIGH (ref 24–36)

## 2023-02-16 MED ORDER — FENTANYL CITRATE (PF) 100 MCG/2ML IJ SOLN
INTRAMUSCULAR | Status: AC
  Filled 2023-02-16: qty 2

## 2023-02-16 MED ORDER — LIDOCAINE HCL 1 % IJ SOLN
INTRAMUSCULAR | Status: AC
  Filled 2023-02-16: qty 20

## 2023-02-16 MED ORDER — PENTAFLUOROPROP-TETRAFLUOROETH EX AERO: 1.0000 | INHALATION_SPRAY | CUTANEOUS | Status: DC | PRN

## 2023-02-16 MED ORDER — IOHEXOL 300 MG/ML  SOLN
50.0000 mL | Freq: Once | INTRAMUSCULAR | Status: AC | PRN
  Administered 2023-02-16: 10 mL via INTRAVENOUS

## 2023-02-16 MED ORDER — LIDOCAINE-PRILOCAINE 2.5-2.5 % EX CREA: 1.0000 | TOPICAL_CREAM | CUTANEOUS | Status: DC | PRN

## 2023-02-16 MED ORDER — LIDOCAINE HCL (PF) 1 % IJ SOLN
5.0000 mL | INTRAMUSCULAR | Status: DC | PRN
  Administered 2023-02-16: 2 mL via INTRADERMAL

## 2023-02-16 MED ORDER — MIDAZOLAM HCL 2 MG/2ML IJ SOLN
INTRAMUSCULAR | Status: AC
  Filled 2023-02-16: qty 2

## 2023-02-16 MED ORDER — HEPARIN SODIUM (PORCINE) 1000 UNIT/ML DIALYSIS
1000.0000 [IU] | INTRAMUSCULAR | Status: DC | PRN
  Administered 2023-02-17: 1000 [IU] via INTRAVENOUS_CENTRAL

## 2023-02-16 MED ORDER — ALTEPLASE 2 MG IJ SOLR: 2.0000 mg | Freq: Once | INTRAMUSCULAR | Status: DC | PRN

## 2023-02-16 NOTE — Plan of Care (Signed)

## 2023-02-16 NOTE — Inpatient Diabetes Management (Signed)
Inpatient Diabetes Program Recommendations  AACE/ADA: New Consensus Statement on Inpatient Glycemic Control (2015)  Target Ranges:  Prepandial:   less than 140 mg/dL      Peak postprandial:   less than 180 mg/dL (1-2 hours)      Critically ill patients:  140 - 180 mg/dL   Lab Results  Component Value Date   GLUCAP 138 (H) 02/16/2023   HGBA1C 6.0 (H) 02/12/2023    Latest Reference Range & Units 02/15/23 07:12 02/15/23 09:45 02/15/23 16:12 02/15/23 19:31 02/15/23 23:37 02/16/23 04:12 02/16/23 07:28  Glucose-Capillary 70 - 99 mg/dL 664 (H) 403 (H) 99 474 (H) 109 (H) 138 (H) 138 (H)  (H): Data is abnormally high  Diabetes history: DM2 Outpatient Diabetes medications:  Glipizide 2.5 mg QAM Lantus 40 units QAM and 30 units at bedtime A1C 6% Current orders for Inpatient glycemic control: Novolog 0-6 q 4 hrs.  Inpatient Diabetes Program Recommendations:   Received consult regarding discharge recommendations. May consider discharge on Low dose of Semglee 5 units every day and followup with doctor post discharge.  Thank you, Dominic [name]. Dominic Grimley, RN, MSN, CDE  Diabetes Coordinator Inpatient Glycemic Control Team Team Pager [phone] (8am-5pm) 02/16/2023 10:05 AM

## 2023-02-16 NOTE — Progress Notes (Signed)
[name] Progress Note   Subjective:    Seen and examined patient at bedside. Reports breathing has unchanged. Noted he's  on 3L O2 [place]. Plan for AVF de-clot in IR today. TDC remains in place until we get the AVF working. Next HD 02/17/23.  Objective Vitals:   [name] 0415 [name] 0417 [name] 0724 [name] 0828  BP: 99/69  113/65   Pulse: 99  87 88  Resp: 19  18 20   Temp: 98.5 F (36.9 C)  98.2 F (36.8 C)   TempSrc: Oral Oral Oral   SpO2: 90% 90% 91% 92%  Weight:  111.3 kg    Height:       Physical Exam General: Alert male in NAD, on 3L 02 [place] Heart: RRR, 2/6 murmur, no rubs or gallops Lungs: Expiratory wheezing lower and mid lobes Abdomen: Soft, non-distended, +BS Extremities: Trace L ankle edema, No edema RLE Dialysis Access: R internal jugular TDC, RUE AVF pulsatile  Filed Weights   02/15/23 1000 02/15/23 1452 [name] 0417  Weight: 115.4 kg 112.8 kg 111.3 kg    Intake/Output Summary (Last 24 hours) at 02/16/2023 1013 Last data filed at 02/16/2023 0745 Gross per 24 hour  Intake 640.16 ml  Output 3200 ml  Net -2559.84 ml    Additional Objective Labs: Basic Metabolic Panel: Recent Labs  Lab 02/13/23 0159 02/13/23 1017 02/14/23 0221 02/14/23 1417 02/14/23 1430 02/14/23 1734 02/15/23 0629 [name] 0221  NA  --    < > 134*   < > 137  --  133* 135  K  --    < > 3.8   < > 3.5  --  3.9 3.6  CL  --    < > 98  --   --   --  100 96*  CO2  --    < > 22  --   --   --  19* 24  GLUCOSE  --    < > 94  --   --   --  122* 139*  BUN  --    < > 34*  --   --   --  43* 27*  CREATININE  --    < > 2.38*  --   --  2.79* 2.80* 2.48*  CALCIUM  --    < > 8.3*  --   --   --  8.4* 8.6*  PHOS 3.7  --   --   --   --   --  3.7  --    < > = values in this interval not displayed.   Liver Function Tests: Recent Labs  Lab 02/12/23 0003 02/15/23 0629  AST 33  --   ALT 26  --   ALKPHOS 87  --   BILITOT 0.6  --   PROT 6.3*  --   ALBUMIN 2.2* 2.3*   No results  for input(s): "LIPASE", "AMYLASE" in the last 168 hours. CBC: Recent Labs  Lab 02/12/23 0003 02/13/23 1017 02/14/23 0221 02/14/23 1417 02/14/23 1734 02/15/23 0630 [name] 0221  WBC 7.0 6.8 5.0  --  5.8 5.8 5.8  NEUTROABS 5.2  --   --   --   --   --   --   HGB 10.6* 10.9* 10.4*   < > 11.0* 10.3* 11.0*  HCT 33.2* 34.2* 31.7*   < > 33.4* 31.9* 33.6*  MCV 92.0 89.3 90.1  --  90.8 88.6 87.3  PLT 179 176 171  --  187 183  196   < > = values in this interval not displayed.   Blood Culture No results found for: "SDES", "SPECREQUEST", "CULT", "REPTSTATUS"  Cardiac Enzymes: No results for input(s): "CKTOTAL", "CKMB", "CKMBINDEX", "TROPONINI" in the last 168 hours. CBG: Recent Labs  Lab 02/15/23 1612 02/15/23 1931 02/15/23 2337 [name] 0412 [name] 0728  GLUCAP 99 152* 109* 138* 138*   Iron Studies: No results for input(s): "IRON", "TIBC", "TRANSFERRIN", "FERRITIN" in the last 72 hours. Lab Results  Component Value Date   INR 1.2 02/12/2023   INR 1.2 06/28/2022   Studies/Results: ECHO TEE  Result Date: 02/15/2023    TRANSESOPHOGEAL ECHO REPORT   Patient Name:   Dominic [name] [name] Date of Exam: 02/15/2023 Medical Rec #:  [id number]      Height:       70.0 in Accession #:    [id number]     Weight:       250.7 lb Date of Birth:  Mar 28, 1951      BSA:          2.298 m Patient Age:    72 years       BP:           124/75 mmHg Patient Gender: M              HR:           87 bpm. Exam Location:  Inpatient Procedure: Transesophageal Echo, Color Doppler, Cardiac Doppler and 3D Echo Indications:    Prosthetic mitral valve stenosis  History:        Patient has prior history of Echocardiogram examinations, most                 recent 02/13/2023. CHF, CAD, Defibrillator, Mitral Valve Disease,                 Mitral Stenosis and Aortic Valve Disease, Arrythmias:Atrial                 Fibrillation and LBBB; Risk Factors:Diabetes and Hypertension.                 CKD.                  Mitral Valve: 31 mm  Edwards-Sapien bioprosthetic valve valve is                 present in the mitral position. Procedure Date: 09/05/2015.  Sonographer:    Milda Smart Referring Phys: [id number] DAYNA N DUNN PROCEDURE: After discussion of the risks and benefits of a TEE, an informed consent was obtained from the patient. TEE procedure time was 42 minutes. The transesophogeal probe was passed without difficulty through the esophogus of the patient. Imaged were obtained with the patient in a left lateral decubitus position. Local oropharyngeal anesthetic was provided with Cetacaine. Sedation performed by different physician. The patient was monitored while under deep sedation. Anesthestetic sedation was provided intravenously by Anesthesiology: 436.61mg  of Propofol. Image quality was excellent. The patient's vital signs; including heart rate, blood pressure, and oxygen saturation; remained stable throughout the procedure. The patient developed no complications during the procedure.  IMPRESSIONS  1. Left ventricular ejection fraction, by estimation, is 30%. The left ventricle has moderately decreased function. The left ventricle demonstrates global hypokinesis and abnormal septal motion secondary to conduction delay.  2. Right ventricular systolic function is moderately reduced. The right ventricular size is normal.  3. Left atrial size was severely dilated. No left atrial/left atrial appendage thrombus was detected.  The LAA emptying velocity was 25 cm/s.  4. Right atrial size was moderately dilated.  5. The mitral valve has been repaired/replaced. Mild mitral valve regurgitation. Severe prosthetic mitral valve stenosis. Prosthetic valve leaflets are calcified and thickened with restricted opening. No definite valvular vegetations, no dehiscence of the prosthesis. The mean mitral valve gradient is 13.0 mmHg at HR 83 bpm. 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 Procedure Date: 09/05/2015. Echo  findings are consistent with stenosis the mitral prosthesis.  6. There is a calcified small lesion on the atrial aspect of the tricuspid valve, likely anterior leaflet best seen at 45 deg (clip 86 and 90). This could represent degenerative calcium vs healed calcified vegetation. Patient has had a previous RV lead.  7. The aortic valve is calcified. There is severe calcifcation of the aortic valve. There is moderate thickening of the aortic valve. Aortic valve regurgitation is moderate, vena contracta 0.5 cm. Moderate aortic valve stenosis. Aortic valve area, by VTI measures 1.32 cm. Aortic valve mean gradient measures 24.0 mmHg. Aortic valve Vmax measures 3.11 m/s.  8. Large pleural effusion.  9. Aortic dilatation noted. There is mild dilatation of the ascending aorta, measuring 42 mm. There is Moderate (Grade III) plaque. FINDINGS  Left Ventricle: Left ventricular ejection fraction, by estimation, is 30%. The left ventricle has moderately decreased function. The left ventricle demonstrates global hypokinesis. The left ventricular internal cavity size was normal in size. Concentric  left ventricular hypertrophy. Abnormal (paradoxical) septal motion, consistent with left bundle branch block. Right Ventricle: The right ventricular size is normal. Right vetricular wall thickness was not well visualized. Right ventricular systolic function is moderately reduced. Left Atrium: Left atrial size was severely dilated. No left atrial/left atrial appendage thrombus was detected. The LAA emptying velocity was 25 cm/s. Right Atrium: Right atrial size was moderately dilated. Pericardium: Trivial pericardial effusion is present. Mitral Valve: The mitral valve has been repaired/replaced. Mild mitral valve regurgitation. 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 Procedure Date: 09/05/2015. 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 Severe mitral valve stenosis. MV  peak gradient, 16.8 mmHg. The mean mitral valve gradient is 13.0 mmHg with average heart rate of 87 bpm. Tricuspid Valve: There is a calcified small lesion on the atrial aspect of the tricuspid valve, likely anterior leaflet best seen at 45 deg (clip 86 and 90). This could represent degenerative calcium vs healed calcified vegetation. Patient has had a previous RV lead. The tricuspid valve is grossly normal. Tricuspid valve regurgitation is mild. Aortic Valve: AV vena contracta 0.5 cm. The aortic valve is calcified. There is severe calcifcation of the aortic valve. There is moderate thickening of the aortic valve. Aortic valve regurgitation is moderate. Aortic regurgitation PHT measures 297 msec.  Moderate aortic stenosis is present. Aortic valve mean gradient measures 24.0 mmHg. Aortic valve peak gradient measures 38.7 mmHg. Aortic valve area, by VTI measures 1.32 cm. There is no evidence of aortic valve vegetation. Pulmonic Valve: The pulmonic valve was not well visualized. Pulmonic valve regurgitation is mild. Aorta: The aortic root is normal in size and structure and aortic dilatation noted. There is mild dilatation of the ascending aorta, measuring 42 mm. There is moderate (Grade III) plaque. IAS/Shunts: No atrial level shunt detected by color flow Doppler. Additional Comments: There is a large pleural effusion. Spectral Doppler performed. LEFT VENTRICLE PLAX 2D LVOT diam:     2.20 cm LV SV:  82 LV SV Index:   36 LVOT Area:     3.80 cm  AORTIC VALVE AV Area (Vmax):    1.28 cm AV Area (Vmean):   1.20 cm AV Area (VTI):     1.32 cm AV Vmax:           311.00 cm/s AV Vmean:          235.000 cm/s AV VTI:            0.627 m AV Peak Grad:      38.7 mmHg AV Mean Grad:      24.0 mmHg LVOT Vmax:         105.00 cm/s LVOT Vmean:        73.900 cm/s LVOT VTI:          0.217 m LVOT/AV VTI ratio: 0.35 AI PHT:            297 msec  AORTA Ao Root diam: 3.70 cm Ao Asc diam:  4.20 cm MITRAL VALVE MV Peak grad: 16.8 mmHg   SHUNTS MV Mean grad: 13.0 mmHg  Systemic VTI:  0.22 m MV Vmax:      2.05 m/s   Systemic Diam: 2.20 cm MV Vmean:     174.0 cm/s Weston Brass MD Electronically signed by Weston Brass MD Signature Date/Time: 02/15/2023/1:45:56 PM    Final    EP STUDY  Result Date: 02/15/2023 See surgical note for result.  CARDIAC CATHETERIZATION  Result Date: 02/15/2023   Mid LM to Ost LAD lesion is 40% stenosed.   Ramus lesion is 100% stenosed.   Ost LAD to Prox LAD lesion is 80% stenosed.  Previously placed Mid LAD lesion is 100% stenosed.   Previously placed Ost Cx to Prox Cx stent of unknown type is  widely patent.   Prox Cx to Mid Cx lesion is 65% stenosed.   1st Mrg lesion is 50% stenosed.   Previously placed Prox RCA lesion is 40% stenosed -focal ISR.   Mid RCA lesion is 85% stenosed.   Mid RCA to Dist RCA lesion is 20% stenosed.  Dist RCA lesion is 20% stenosed.   SVG-RI graft was visualized by angiography and is large.  The graft exhibits no disease.   LIMA-LAD graft was visualized by angiography and is large.  The graft exhibits no disease.   ------------------------------------------------------------   Hemodynamic findings consistent with severe pulmonary hypertension.   There is moderate aortic valve stenosis.   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 The stent appears to be in the proximal to mid segment followed by a 85% stenosis in the mid RCA. -> Widely patent large LIMA to mid LAD with retrograde filling of the 2nd Diag branch; widely patent SVG-RI Right Heart Cath/Hemodynamics: RA P 13 mmHg;  RVP-EDP 65/3-11 mmHg; PAP-mean 60/35-45 mmHg, PCWP 40 to 44 mmHg (V waves appeared more prominent), LV P-EDP 150/6-14 mmHg, AO P-MAP 114/64-82 mmHg Wedge-LVEDP gradient ~30 mmHg; AoV mean gradient 35 mmHg Ao sat 88%, PA sat 56%; (FICK) Cardiac Output-Index 6.76-2.94 RECOMMENDATIONS Optimization of medical and fluid management Will likely need staged imaging-guided PCI of  the RCA with potential FFR guided PCI to the LCx as well => discussion with Dr. Lennie Odor, this will need to be done after having his dialysis fistula cleared and dialysis catheter removed (this would prevent having to hold Plavix). Bryan Lemma, MD   Medications:  sodium chloride     sodium chloride     heparin 1,300 Units/hr ([name] 0745)    allopurinol  100 mg Oral Daily   amiodarone  200 mg Oral Daily   aspirin EC  81 mg Oral Daily   atorvastatin  40 mg Oral Daily   Chlorhexidine Gluconate Cloth  6 each Topical Daily   Chlorhexidine Gluconate Cloth  6 each Topical Q0600   cholecalciferol  2,000 Units Oral Daily   DULoxetine  60 mg Oral Daily   famotidine  10 mg Oral Daily   ferrous sulfate  325 mg Oral Q breakfast   hydrALAZINE  10 mg Oral TID   insulin aspart  0-6 Units Subcutaneous Q4H   isosorbide mononitrate  30 mg Oral Daily   midodrine  10 mg Oral Q T,Th,Sa-HD   mometasone-formoterol  2 puff Inhalation BID   nicotine  14 mg Transdermal Daily   sodium chloride flush  3 mL Intravenous Q12H   sodium chloride flush  3 mL Intravenous Q12H   sucralfate  1 g Oral BID    Dialysis Orders: [facility] TTS Dr Lequita Halt  3.5h 112kg   300/600   3K/2.5Ca bath  TDC   Heparin none   Assessment/Plan: 1.Syncope/ bradycardia - cardiology consulting. EF 35% with severe stenosis of prosthetic mitral valve. Cardiology is hoping [facility] can be removed to get another CRT device at some point. Pt has a clotted AVF, had declot scheduled for next week. Plan for AVF de-clot in IR today, patient is NPO, will need to make sure it is  functional on HD before removing his TDC.  2.Acute on chronic hypoxic resp failure - w/ pulm edema by CXR. Push UF as tolerated.  3.ESRD - on HD TTS. Has not missed HD lately, Continue TTS schedule 4.HTN/ volume - BP's are borderline low. Holding home HTN meds and nitrates. Removing volume as tolerated.  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 Not currently on a binder but is NPO for procedure anyway. If phos trends up, will start binder  7.NSTEMI - per cardiology 8.PAF- bradycardia this admission, see above. Rate currently controlled.    Salome Holmes, NP [facility] 02/16/2023,10:13 AM  LOS: 4 days

## 2023-02-16 NOTE — Procedures (Signed)
  Procedure:  Dialysis fistulagram : outflow vein occluded several cm central to brachiocephalic fistula, could not be recanalized centrally Preprocedure diagnosis: The encounter diagnosis was Symptomatic bradycardia. Postprocedure diagnosis: same EBL:    minimal Complications:   none immediate  See full dictation in [employer].  Thora Lance MD Main # [phone] Pager  [phone] Mobile [phone]

## 2023-02-16 NOTE — Sedation Documentation (Signed)
No sedation given.

## 2023-02-16 NOTE — Progress Notes (Signed)
Cardiology Progress Note  Patient ID: Dominic [name] MRN: [id number] DOB: 19-Mar-1951 Date of Encounter: 02/16/2023  Primary Cardiologist: Thurmon Fair, MD  Subjective   Chief Complaint: Shortness of breath  HPI: Good volume removal.  Plan for IR procedure on AV fistula today.  Denies chest pain.  Telemetry stable.  ROS:  All other ROS reviewed and negative. Pertinent positives noted in the HPI.     Inpatient Medications  Scheduled Meds:  allopurinol  100 mg Oral Daily   amiodarone  200 mg Oral Daily   aspirin EC  81 mg Oral Daily   atorvastatin  40 mg Oral Daily   Chlorhexidine Gluconate Cloth  6 each Topical Daily   Chlorhexidine Gluconate Cloth  6 each Topical Q0600   cholecalciferol  2,000 Units Oral Daily   DULoxetine  60 mg Oral Daily   famotidine  10 mg Oral Daily   ferrous sulfate  325 mg Oral Q breakfast   hydrALAZINE  10 mg Oral TID   insulin aspart  0-6 Units Subcutaneous Q4H   isosorbide mononitrate  30 mg Oral Daily   midodrine  10 mg Oral Q T,Th,Sa-HD   mometasone-formoterol  2 puff Inhalation BID   nicotine  14 mg Transdermal Daily   sodium chloride flush  3 mL Intravenous Q12H   sodium chloride flush  3 mL Intravenous Q12H   sucralfate  1 g Oral BID   Continuous Infusions:  sodium chloride     sodium chloride     heparin 1,300 Units/hr (02/16/23 0745)   PRN Meds: sodium chloride, sodium chloride, acetaminophen, albuterol, fluticasone, midodrine, ondansetron (ZOFRAN) IV, sodium chloride flush, sodium chloride flush   Vital Signs   Vitals:   02/16/23 0415 02/16/23 0417 02/16/23 0724 02/16/23 0828  BP: 99/69  113/65   Pulse: 99  87 88  Resp: 19  18 20   Temp: 98.5 F (36.9 C)  98.2 F (36.8 C)   TempSrc: Oral Oral Oral   SpO2: 90% 90% 91% 92%  Weight:  111.3 kg    Height:        Intake/Output Summary (Last 24 hours) at 02/16/2023 0939 Last data filed at 02/16/2023 0745 Gross per 24 hour  Intake 640.16 ml  Output 3200 ml  Net -2559.84 ml       02/16/2023    4:17 AM 02/15/2023    2:52 PM 02/15/2023   10:00 AM  Last 3 Weights  Weight (lbs) 245 lb 6 oz 248 lb 10.9 oz 254 lb 6.6 oz  Weight (kg) 111.3 kg 112.8 kg 115.4 kg      Telemetry  Overnight telemetry shows sinus rhythm 80s, which I personally reviewed.   Physical Exam   Vitals:   02/16/23 0415 02/16/23 0417 02/16/23 0724 02/16/23 0828  BP: 99/69  113/65   Pulse: 99  87 88  Resp: 19  18 20   Temp: 98.5 F (36.9 C)  98.2 F (36.8 C)   TempSrc: Oral Oral Oral   SpO2: 90% 90% 91% 92%  Weight:  111.3 kg    Height:        Intake/Output Summary (Last 24 hours) at 02/16/2023 0939 Last data filed at 02/16/2023 0745 Gross per 24 hour  Intake 640.16 ml  Output 3200 ml  Net -2559.84 ml       02/16/2023    4:17 AM 02/15/2023    2:52 PM 02/15/2023   10:00 AM  Last 3 Weights  Weight (lbs) 245 lb 6 oz 248 lb 10.9 oz  254 lb 6.6 oz  Weight (kg) 111.3 kg 112.8 kg 115.4 kg    Body mass index is 35.21 kg/m.  General: Well nourished, well developed, in no acute distress Head: Atraumatic, normal size  Eyes: PEERLA, EOMI  Neck: Supple, no JVD Endocrine: No thryomegaly Cardiac: Normal S1, S2; RRR; 2 out of 6 systolic ejection murmur, diastolic rumble Lungs: Diminished breath sounds bilaterally Abd: Soft, nontender, no hepatomegaly  Ext: No edema, pulses 2+ Musculoskeletal: No deformities, BUE and BLE strength normal and equal Skin: Warm and dry, no rashes   Neuro: Alert and oriented to person, place, time, and situation, CNII-XII grossly intact, no focal deficits  Psych: Normal mood and affect   Labs  High Sensitivity Troponin:   Recent Labs  Lab 02/12/23 0003 02/13/23 0002  TROPONINIHS 1,099* 1,089*     Cardiac EnzymesNo results for input(s): "TROPONINI" in the last 168 hours. No results for input(s): "TROPIPOC" in the last 168 hours.  Chemistry Recent Labs  Lab 02/12/23 0003 02/13/23 1017 02/14/23 0221 02/14/23 1417 02/14/23 1430 02/14/23 1734 02/15/23 0629  02/16/23 0221  NA 134*   < > 134*   < > 137  --  133* 135  K 4.1   < > 3.8   < > 3.5  --  3.9 3.6  CL 105   < > 98  --   --   --  100 96*  CO2 20*   < > 22  --   --   --  19* 24  GLUCOSE 84   < > 94  --   --   --  122* 139*  BUN 50*   < > 34*  --   --   --  43* 27*  CREATININE 3.26*   < > 2.38*  --   --  2.79* 2.80* 2.48*  CALCIUM 8.3*   < > 8.3*  --   --   --  8.4* 8.6*  PROT 6.3*  --   --   --   --   --   --   --   ALBUMIN 2.2*  --   --   --   --   --  2.3*  --   AST 33  --   --   --   --   --   --   --   ALT 26  --   --   --   --   --   --   --   ALKPHOS 87  --   --   --   --   --   --   --   BILITOT 0.6  --   --   --   --   --   --   --   GFRNONAA 19*   < > 28*  --   --  23* 23* 27*  ANIONGAP 9   < > 14  --   --   --  14 15   < > = values in this interval not displayed.    Hematology Recent Labs  Lab 02/14/23 1734 02/15/23 0630 02/16/23 0221  WBC 5.8 5.8 5.8  RBC 3.68* 3.60* 3.85*  HGB 11.0* 10.3* 11.0*  HCT 33.4* 31.9* 33.6*  MCV 90.8 88.6 87.3  MCH 29.9 28.6 28.6  MCHC 32.9 32.3 32.7  RDW 18.2* 18.0* 18.0*  PLT 187 183 196   BNP Recent Labs  Lab 02/12/23 0005  BNP 1,051.6*    DDimer No  results for input(s): "DDIMER" in the last 168 hours.   Radiology  ECHO TEE  Result Date: 02/15/2023    TRANSESOPHOGEAL ECHO REPORT   Patient Name:   Dominic [name] Date of Exam: 02/15/2023 Medical Rec #:  [id number]      Height:       70.0 in Accession #:    [id number]     Weight:       250.7 lb Date of Birth:  1951-02-20      BSA:          2.298 m Patient Age:    72 years       BP:           124/75 mmHg Patient Gender: M              HR:           87 bpm. Exam Location:  Inpatient Procedure: Transesophageal Echo, Color Doppler, Cardiac Doppler and 3D Echo Indications:    Prosthetic mitral valve stenosis  History:        Patient has prior history of Echocardiogram examinations, most                 recent 02/13/2023. CHF, CAD, Defibrillator, Mitral Valve Disease,                 Mitral  Stenosis and Aortic Valve Disease, Arrythmias:Atrial                 Fibrillation and LBBB; Risk Factors:Diabetes and Hypertension.                 CKD.                  Mitral Valve: 31 mm Edwards-Sapien bioprosthetic valve valve is                 present in the mitral position. Procedure Date: 09/05/2015.  Sonographer:    Milda Smart Referring Phys: [id number] DAYNA N DUNN PROCEDURE: After discussion of the risks and benefits of a TEE, an informed consent was obtained from the patient. TEE procedure time was 42 minutes. The transesophogeal probe was passed without difficulty through the esophogus of the patient. Imaged were obtained with the patient in a left lateral decubitus position. Local oropharyngeal anesthetic was provided with Cetacaine. Sedation performed by different physician. The patient was monitored while under deep sedation. Anesthestetic sedation was provided intravenously by Anesthesiology: 436.61mg  of Propofol. Image quality was excellent. The patient's vital signs; including heart rate, blood pressure, and oxygen saturation; remained stable throughout the procedure. The patient developed no complications during the procedure.  IMPRESSIONS  1. Left ventricular ejection fraction, by estimation, is 30%. The left ventricle has moderately decreased function. The left ventricle demonstrates global hypokinesis and abnormal septal motion secondary to conduction delay.  2. Right ventricular systolic function is moderately reduced. The right ventricular size is normal.  3. Left atrial size was severely dilated. No left atrial/left atrial appendage thrombus was detected. The LAA emptying velocity was 25 cm/s.  4. Right atrial size was moderately dilated.  5. The mitral valve has been repaired/replaced. Mild mitral valve regurgitation. Severe prosthetic mitral valve stenosis. Prosthetic valve leaflets are calcified and thickened with restricted opening. No definite valvular vegetations, no dehiscence of the  prosthesis. The mean mitral valve gradient is 13.0 mmHg at HR 83 bpm. 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 Procedure Date: 09/05/2015. Echo findings are consistent with  stenosis the mitral prosthesis.  6. There is a calcified small lesion on the atrial aspect of the tricuspid valve, likely anterior leaflet best seen at 45 deg (clip 86 and 90). This could represent degenerative calcium vs healed calcified vegetation. Patient has had a previous RV lead.  7. The aortic valve is calcified. There is severe calcifcation of the aortic valve. There is moderate thickening of the aortic valve. Aortic valve regurgitation is moderate, vena contracta 0.5 cm. Moderate aortic valve stenosis. Aortic valve area, by VTI measures 1.32 cm. Aortic valve mean gradient measures 24.0 mmHg. Aortic valve Vmax measures 3.11 m/s.  8. Large pleural effusion.  9. Aortic dilatation noted. There is mild dilatation of the ascending aorta, measuring 42 mm. There is Moderate (Grade III) plaque. FINDINGS  Left Ventricle: Left ventricular ejection fraction, by estimation, is 30%. The left ventricle has moderately decreased function. The left ventricle demonstrates global hypokinesis. The left ventricular internal cavity size was normal in size. Concentric  left ventricular hypertrophy. Abnormal (paradoxical) septal motion, consistent with left bundle branch block. Right Ventricle: The right ventricular size is normal. Right vetricular wall thickness was not well visualized. Right ventricular systolic function is moderately reduced. Left Atrium: Left atrial size was severely dilated. No left atrial/left atrial appendage thrombus was detected. The LAA emptying velocity was 25 cm/s. Right Atrium: Right atrial size was moderately dilated. Pericardium: Trivial pericardial effusion is present. Mitral Valve: The mitral valve has been repaired/replaced. Mild mitral valve regurgitation. There is a 31 mm  Edwards-Sapien bioprosthetic valve present in the mitral position. Procedure Date: 09/05/2015. 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 Severe mitral valve stenosis. MV peak gradient, 16.8 mmHg. The mean mitral valve gradient is 13.0 mmHg with average heart rate of 87 bpm. Tricuspid Valve: There is a calcified small lesion on the atrial aspect of the tricuspid valve, likely anterior leaflet best seen at 45 deg (clip 86 and 90). This could represent degenerative calcium vs healed calcified vegetation. Patient has had a previous RV lead. The tricuspid valve is grossly normal. Tricuspid valve regurgitation is mild. Aortic Valve: AV vena contracta 0.5 cm. The aortic valve is calcified. There is severe calcifcation of the aortic valve. There is moderate thickening of the aortic valve. Aortic valve regurgitation is moderate. Aortic regurgitation PHT measures 297 msec.  Moderate aortic stenosis is present. Aortic valve mean gradient measures 24.0 mmHg. Aortic valve peak gradient measures 38.7 mmHg. Aortic valve area, by VTI measures 1.32 cm. There is no evidence of aortic valve vegetation. Pulmonic Valve: The pulmonic valve was not well visualized. Pulmonic valve regurgitation is mild. Aorta: The aortic root is normal in size and structure and aortic dilatation noted. There is mild dilatation of the ascending aorta, measuring 42 mm. There is moderate (Grade III) plaque. IAS/Shunts: No atrial level shunt detected by color flow Doppler. Additional Comments: There is a large pleural effusion. Spectral Doppler performed. LEFT VENTRICLE PLAX 2D LVOT diam:     2.20 cm LV SV:         82 LV SV Index:   36 LVOT Area:     3.80 cm  AORTIC VALVE AV Area (Vmax):    1.28 cm AV Area (Vmean):   1.20 cm AV Area (VTI):     1.32 cm AV Vmax:           311.00 cm/s AV Vmean:          235.000 cm/s AV VTI:  0.627 m AV Peak Grad:      38.7 mmHg AV Mean Grad:      24.0 mmHg LVOT Vmax:         105.00 cm/s  LVOT Vmean:        73.900 cm/s LVOT VTI:          0.217 m LVOT/AV VTI ratio: 0.35 AI PHT:            297 msec  AORTA Ao Root diam: 3.70 cm Ao Asc diam:  4.20 cm MITRAL VALVE MV Peak grad: 16.8 mmHg  SHUNTS MV Mean grad: 13.0 mmHg  Systemic VTI:  0.22 m MV Vmax:      2.05 m/s   Systemic Diam: 2.20 cm MV Vmean:     174.0 cm/s Weston Brass MD Electronically signed by Weston Brass MD Signature Date/Time: 02/15/2023/1:45:56 PM    Final    EP STUDY  Result Date: 02/15/2023 See surgical note for result.  CARDIAC CATHETERIZATION  Result Date: 02/15/2023   Mid LM to Ost LAD lesion is 40% stenosed.   Ramus lesion is 100% stenosed.   Ost LAD to Prox LAD lesion is 80% stenosed.  Previously placed Mid LAD lesion is 100% stenosed.   Previously placed Ost Cx to Prox Cx stent of unknown type is  widely patent.   Prox Cx to Mid Cx lesion is 65% stenosed.   1st Mrg lesion is 50% stenosed.   Previously placed Prox RCA lesion is 40% stenosed -focal ISR.   Mid RCA lesion is 85% stenosed.   Mid RCA to Dist RCA lesion is 20% stenosed.  Dist RCA lesion is 20% stenosed.   SVG-RI graft was visualized by angiography and is large.  The graft exhibits no disease.   LIMA-LAD graft was visualized by angiography and is large.  The graft exhibits no disease.   ------------------------------------------------------------   Hemodynamic findings consistent with severe pulmonary hypertension.   There is moderate aortic valve stenosis.   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 The stent appears to be in the proximal to mid segment  followed by a 85% stenosis in the mid RCA. -> Widely patent large LIMA to mid LAD with retrograde filling of the 2nd Diag branch; widely patent SVG-RI Right Heart Cath/Hemodynamics: RA P 13 mmHg; RVP-EDP 65/3-11 mmHg; PAP-mean 60/35-45 mmHg, PCWP 40 to 44 mmHg (V waves appeared more prominent), LV P-EDP 150/6-14 mmHg, AO P-MAP 114/64-82 mmHg Wedge-LVEDP gradient ~30 mmHg; AoV mean gradient 35 mmHg Ao sat 88%, PA sat 56%; (FICK) Cardiac Output-Index 6.76-2.94 RECOMMENDATIONS Optimization of medical and fluid management Will likely need staged imaging-guided PCI of the RCA with potential FFR guided PCI to the LCx as well => discussion with Dr. Lennie Odor, this will need to be done after having his dialysis fistula cleared and dialysis catheter removed (this would prevent having to hold Plavix). Bryan Lemma, MD   Cardiac Studies  LHC/RHC 02/13/2022  Severe multivessel CAD: -> Distal LM 40%, ostial LAD 80% followed by short normal segment with SP and small to Diag branch then 100% occluded stent 100% ostial RI occlusion\  Widely patent ostial-proximal LCx stent with ~65% stenosis just distal to the stent and prior to OM/AVG bifurcation with trivial disease in the OM branches 40% ostial RCA with heavily calcified RCA (difficult to assess stent versus calcification. The stent appears to be in the proximal to mid segment followed by a 85% stenosis in the mid RCA. -> Widely patent large LIMA to mid LAD with retrograde filling of the 2nd Diag branch; widely patent SVG-RI Right Heart Cath/Hemodynamics: RA P 13 mmHg; RVP-EDP 65/3-11 mmHg; PAP-mean 60/35-45 mmHg, PCWP 40 to 44 mmHg (V waves appeared more prominent), LV P-EDP 150/6-14 mmHg, AO P-MAP 114/64-82 mmHg Wedge-LVEDP gradient ~30 mmHg; AoV mean gradient 35 mmHg Ao sat 88%, PA sat 56%; (FICK) Cardiac Output-Index 6.76-2.94    RECOMMENDATIONS Optimization of medical and fluid management Will likely need staged imaging-guided PCI of the RCA with  potential FFR guided PCI to the LCx as well => discussion with Dr. Lennie Odor, this will need to be done after having his dialysis fistula cleared and dialysis catheter removed (this would prevent having to hold Plavix).  Patient Profile  Dominic [name] is a 72 y.o. 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 Course complicated by acute systolic heart failure and volume overload. Also likely non-STEMI which is secondary.   Assessment & Plan   # Syncope, symptomatic bradycardia -Admitted with syncope and symptomatic bradycardia.  Beta-blocker was stopped.  Has had CRT-D system removed last year due to bacteremia.  After long discussion with the EP not a good candidate for temporary pacemaker, CRT-D reimplantation, or Micra device.  He actually has been stable off metoprolol.  No further bradycardia.  Given high risk of infection EP will follow this conservatively.  I do agree with this.  # Acute systolic heart failure, EF 35% # Left bundle branch block, significant LV dyssynchrony -With worsening heart failure likely secondary to LV dysfunction, severe aortic stenosis and severe bioprosthetic mitral valve stenosis.  EF drop is due to lack of CRT. -No beta-blocker due to bradycardia as above.  The best we can offer is medical management at this time.  See discussion on CAD below.  His LVEF is related to dyssynchrony and I do not believe related to his CAD.  Could be a mixed picture. -Continue Imdur hydralazine.  Volume removal per dialysis.  # Moderate aortic stenosis/moderate to severe aortic insufficiency # Severe stenosis of bioprosthetic mitral valve -TEE has confirm severe stenosis of bioprosthetic mitral valve.  He has combined severe aortic valvular heart disease.  Currently not a candidate for structural heart  procedures due to indwelling HD catheter.  He is going to have his AV fistula evaluated by interventional radiology today.  If he can get his HD catheter out he could then be evaluated by structural heart team.  I did have a discussion with the patient today.  He is really unsure if he would want to pursue all of these procedures.  I have recommended palliative care consultation for goals of care.  They will help [place]. -In the meantime his management will be conservative.  # ESRD # Occluded right AV fistula # Indwelling HD catheter -IR will evaluate his AV fistula today.  He has an indwelling HD catheter which is problematic for management of his valvular heart disease. -Currently on heparin drip.  We will see what  IR does today.  If this can be declogged anticipate discharge tomorrow. -After long discussion about pacing it appears we will plan for conservative and watchful waiting approach.  He can be evaluated in the outpatient setting for valvular heart disease intervention.  Again it may be best to treat him conservatively as all of these procedures are quite invasive in this patient with multiple comorbidities and advanced valvular heart disease.  # Non-STEMI # Severe RCA stenosis -Suspect his troponin elevation was secondary to decompensated heart failure.  Left heart catheterization with severe stenosis in the mid RCA and moderate stenosis in the OM branch.  Patent LIMA to LAD, patent SVG to ramus.  Plan currently is for medical management as he has ongoing needs for procedures for his dialysis access.  He reports no symptoms of angina.  He is doing well with volume removal. -Continue aspirin 81 mg daily.  Continue high intensity statin. -If he does proceed with valvular heart disease intervention will likely need staged PCI to the RCA.  For now plan is medical management.  # Paroxysmal atrial fibrillation -Maintaining sinus rhythm.  Okay for amiodarone.  No beta-blocker. -Currently on heparin  drip as he will have IR procedure today.  Transition to DOAC tonight or tomorrow when radiology does this is okay.  # Severe COPD -Home inhalers  # Type 2 diabetes -Lantus 20 units nightly.  Sign scale insulin.  # FEN -No intravenous fluids -Code: Full -Diet: Heart healthy -Disposition: Anticipate discharge tomorrow.  He will undergo IR procedures to declog his AV fistula today.  Possibly his HD catheter can be removed.  He will need to be transition back to Eliquis which will take place after IR says this is okay.  Volume status is improving.  Plan for medical management of CAD, CHF and valvular heart disease for the moment.  He can be evaluated as an outpatient for all of this.  We will also have palliative care see him today.  He is not sure if he wants to go through all of these procedures.  May be good to get a better goals of care discussion.    For questions or updates, please contact [facility] Please consult [email or web address] for contact info under        Signed, Gerri Spore T. Flora Lipps, MD, [facility] [place]  [facility]  02/16/2023 9:39 AM

## 2023-02-16 NOTE — Care Management Important Message (Signed)
Important Message  Patient Details  Name: Dominic [name] MRN: [id number] Date of Birth: 02-Jul-1950   Medicare Important Message Given:  Yes     Dorena Bodo 02/16/2023, 2:36 PM

## 2023-02-16 NOTE — Progress Notes (Signed)
Rounding Note    Patient Name: Dominic [name] Date of Encounter: 02/16/2023  [facility] Cardiologist: Thurmon Fair, MD   Subjective   Doing OK, baseline SOB about the same, no CP  Inpatient Medications    Scheduled Meds:  allopurinol  100 mg Oral Daily   amiodarone  200 mg Oral Daily   aspirin EC  81 mg Oral Daily   atorvastatin  40 mg Oral Daily   Chlorhexidine Gluconate Cloth  6 each Topical Daily   Chlorhexidine Gluconate Cloth  6 each Topical Q0600   cholecalciferol  2,000 Units Oral Daily   DULoxetine  60 mg Oral Daily   famotidine  10 mg Oral Daily   ferrous sulfate  325 mg Oral Q breakfast   hydrALAZINE  10 mg Oral TID   insulin aspart  0-6 Units Subcutaneous Q4H   isosorbide mononitrate  30 mg Oral Daily   midodrine  10 mg Oral Q T,Th,Sa-HD   mometasone-formoterol  2 puff Inhalation BID   nicotine  14 mg Transdermal Daily   sodium chloride flush  3 mL Intravenous Q12H   sodium chloride flush  3 mL Intravenous Q12H   sucralfate  1 g Oral BID   Continuous Infusions:  sodium chloride     sodium chloride     heparin 1,300 Units/hr (02/16/23 0745)   PRN Meds: sodium chloride, sodium chloride, acetaminophen, albuterol, fluticasone, midodrine, ondansetron (ZOFRAN) IV, sodium chloride flush, sodium chloride flush   Vital Signs    Vitals:   02/15/23 2339 02/16/23 0415 02/16/23 0417 02/16/23 0724  BP:  99/69  113/65  Pulse:  99  87  Resp:  19  18  Temp: 98.2 F (36.8 C) 98.5 F (36.9 C)  98.2 F (36.8 C)  TempSrc: Oral Oral Oral Oral  SpO2:  90% 90% 91%  Weight:   111.3 kg   Height:        Intake/Output Summary (Last 24 hours) at 02/16/2023 0821 Last data filed at 02/16/2023 0745 Gross per 24 hour  Intake 740.16 ml  Output 3200 ml  Net -2459.84 ml      02/16/2023    4:17 AM 02/15/2023    2:52 PM 02/15/2023   10:00 AM  Last 3 Weights  Weight (lbs) 245 lb 6 oz 248 lb 10.9 oz 254 lb 6.6 oz  Weight (kg) 111.3 kg 112.8 kg 115.4 kg       Telemetry    SR, PACs, 80's no bradycardia, occ PACs, PVCs - Personally Reviewed  ECG    SR 95bpm, PACs, 1st degree AVBlock - Personally Reviewed  Physical Exam   Seen by Dr. Lalla Brothers, unchanged from yesterday GEN: No acute distress.   Neck: No JVD Cardiac: RRR, 2-3 DM, rubs, or gallops.  Respiratory: CTA b/l GI: Soft, nontender, non-distended  MS: No edema; No deformity. Neuro:  Nonfocal  Psych: Normal affect   Labs    High Sensitivity Troponin:   Recent Labs  Lab 02/12/23 0003 02/13/23 0002  TROPONINIHS 1,099* 1,089*     Chemistry Recent Labs  Lab 02/12/23 0003 02/13/23 1017 02/14/23 0221 02/14/23 1417 02/14/23 1430 02/14/23 1734 02/15/23 0629 02/16/23 0221  NA 134*   < > 134*   < > 137  --  133* 135  K 4.1   < > 3.8   < > 3.5  --  3.9 3.6  CL 105   < > 98  --   --   --  100 96*  CO2 20*   < > 22  --   --   --  19* 24  GLUCOSE 84   < > 94  --   --   --  122* 139*  BUN 50*   < > 34*  --   --   --  43* 27*  CREATININE 3.26*   < > 2.38*  --   --  2.79* 2.80* 2.48*  CALCIUM 8.3*   < > 8.3*  --   --   --  8.4* 8.6*  MG 1.9  --   --   --   --   --   --   --   PROT 6.3*  --   --   --   --   --   --   --   ALBUMIN 2.2*  --   --   --   --   --  2.3*  --   AST 33  --   --   --   --   --   --   --   ALT 26  --   --   --   --   --   --   --   ALKPHOS 87  --   --   --   --   --   --   --   BILITOT 0.6  --   --   --   --   --   --   --   GFRNONAA 19*   < > 28*  --   --  23* 23* 27*  ANIONGAP 9   < > 14  --   --   --  14 15   < > = values in this interval not displayed.    Lipids  Recent Labs  Lab 02/12/23 0004  CHOL 128  TRIG 106  HDL 46  LDLCALC 61  CHOLHDL 2.8    Hematology Recent Labs  Lab 02/14/23 1734 02/15/23 0630 02/16/23 0221  WBC 5.8 5.8 5.8  RBC 3.68* 3.60* 3.85*  HGB 11.0* 10.3* 11.0*  HCT 33.4* 31.9* 33.6*  MCV 90.8 88.6 87.3  MCH 29.9 28.6 28.6  MCHC 32.9 32.3 32.7  RDW 18.2* 18.0* 18.0*  PLT 187 183 196   Thyroid  Recent  Labs  Lab 02/12/23 0003  TSH 2.501    BNP Recent Labs  Lab 02/12/23 0005  BNP 1,051.6*    DDimer No results for input(s): "DDIMER" in the last 168 hours.   Radiology      Cardiac Studies   02/15/23: TEE Severe prosthetic mitral valve stenosis, gradient @ HR 81. Mild MR.  Moderate AS and at least moderate-severe AI, mean sys gradient 24 mmHg. PHT 297 ms.  Mild TR No valvular vegetations identified. Degenerative calcifications on the mitral and aortic valves.  EF ~ 30%, dyssynchrony due to conduction delay. Moderate RV dysfunction.  Severe LA dilation, moderate RA dilation.  02/14/23: R/LHC Mid LM to Ost LAD lesion is 40% stenosed.   Ramus lesion is 100% stenosed.   Ost LAD to Prox LAD lesion is 80% stenosed.  Previously placed Mid LAD lesion is 100% stenosed.   Previously placed Ost Cx to Prox Cx stent of unknown type is  widely patent.   Prox Cx to Mid Cx lesion is 65% stenosed.   1st Mrg lesion is 50% stenosed.   Previously placed Prox RCA lesion is 40% stenosed -focal ISR.   Mid RCA lesion is  85% stenosed.   Mid RCA to Dist RCA lesion is 20% stenosed.  Dist RCA lesion is 20% stenosed.   SVG-RI graft was visualized by angiography and is large.  The graft exhibits no disease.   LIMA-LAD graft was visualized by angiography and is large.  The graft exhibits no disease.   ------------------------------------------------------------   Hemodynamic findings consistent with severe pulmonary hypertension.   There is moderate aortic valve stenosis.   There is severe mitral valve stenosis.   Severe multivessel CAD: -> Distal LM 40%, ostial LAD 80% followed by short normal segment with SP and small to Diag branch then 100% occluded stent 100% ostial RI occlusion\ Widely patent ostial-proximal LCx stent with ~65% stenosis just distal to the stent and prior to OM/AVG bifurcation with trivial disease in the OM branches 40% ostial RCA with heavily calcified RCA (difficult to  assess stent versus calcification. The stent appears to be in the proximal to mid segment followed by a 85% stenosis in the mid RCA. -> Widely patent large LIMA to mid LAD with retrograde filling of the 2nd Diag branch; widely patent SVG-RI Right Heart Cath/Hemodynamics: RA P 13 mmHg; RVP-EDP 65/3-11 mmHg; PAP-mean 60/35-45 mmHg, PCWP 40 to 44 mmHg (V waves appeared more prominent), LV P-EDP 150/6-14 mmHg, AO P-MAP 114/64-82 mmHg Wedge-LVEDP gradient ~30 mmHg; AoV mean gradient 35 mmHg Ao sat 88%, PA sat 56%; (FICK) Cardiac Output-Index 6.76-2.94    RECOMMENDATIONS Optimization of medical and fluid management Will likely need staged imaging-guided PCI of the RCA with potential FFR guided PCI to the LCx as well => discussion with Dr. Lennie Odor, this will need to be done after having his dialysis fistula cleared and dialysis catheter removed (this would prevent having to hold Plavix).    02/13/23: TTE 1. There is no left ventricular thrombus (Definity contrast was used).  There is severe septal-lateral dyssynchrony due to LBBB. Left ventricular  ejection fraction, by estimation, is 35 to 40%. The left ventricle has  moderately decreased function. The  left ventricle demonstrates global hypokinesis. The left ventricular  internal cavity size was mildly dilated. There is mild concentric left  ventricular hypertrophy. Indeterminate diastolic filling due to E-A  fusion.   2. Right ventricular systolic function is moderately reduced. The right  ventricular size is moderately enlarged. There is moderately elevated  pulmonary artery systolic pressure. The estimated right ventricular  systolic pressure is 57.3 mmHg.   3. Left atrial size was severely dilated.   4. Right atrial size was moderately dilated.   5. The mitral valve has been repaired/replaced. No evidence of mitral  valve regurgitation. Severe mitral stenosis. The mean mitral valve  gradient is 18.0 mmHg with average heart rate of 95  bpm. There is a 31 mm  Edwards Sapien bioprosthetic valve present   in the mitral position. Procedure Date: 09/05/2015. Echo findings are  consistent with stenosis the mitral prosthesis.   6. Large pleural effusion.   7. Tricuspid valve regurgitation is moderate to severe.   8. The aortic valve is tricuspid. There is moderate calcification of the  aortic valve. There is moderate thickening of the aortic valve. Aortic  valve regurgitation is moderate. Moderate aortic valve stenosis. Aortic  regurgitation PHT measures 319 msec.  Aortic valve mean gradient measures 23.3 mmHg. Aortic valve Vmax measures  3.21 m/s.   9. Aortic dilatation noted. There is mild dilatation of the ascending  aorta, measuring 39 mm. There is mild dilatation of the aortic root,  measuring 42  mm.  10. Hepatic vein flow reversal is seen is seen intermittently (likely with  respiration, spirometry was not performed). The inferior vena cava is  dilated in size with >50% respiratory variability, suggesting right atrial  pressure of 8 mmHg.   Comparison(s): The left ventricular function is worsened. There is marked  increase in mitral valve prosthesis gradients, which may be partly related  to the increase in heart rate, but could also be due to prosthetic valve  endocarditis. Pulmonary artery  hypertension is worse, likley due to increase left heart pressures. Aortic  stenosis is only slightly worse.   06/13/2022: TTE ([facility]) SUMMARY  Left ventricular systolic function is moderate to severely reduced.  LV ejection fraction = 30-35%.  There is moderate global hypokinesis of the left ventricle.  Abnormal  paradoxical  septal motion consistent with LBBB.  The right ventricular systolic function is normal.  The left atrium is moderately to severely dilated.  There is moderate to severe aortic stenosis.  There is moderate aortic regurgitation.  There is a bioprosthetic mitral valve.  There is moderate mitral  stenosis.  There is trace mitral regurgitation.  There is mild tricuspid regurgitation.  Mild pulmonary hypertension.  Estimated right ventricular systolic pressure is 50 mmHg.  There is no pericardial effusion.  Compared to the last study dated 11-06-2021, LV systolic function has declined.  The aortic stenosis and mitral  prosthetic  stenosis have worsened.   Patient Profile     72 y.o. male combined CHF (systolic/diastolic), ICM, CAD, VHD (s/p MVR bioprosthetic/CABG, PCIs), COPD (on home O2 3L), ESRF on HD, DM, AFib admitted with syncope  Hx of: staph bacteremia April 2023 >>  suspicion of small vegetations on the mitral valve bioprosthesis, RV lead and tricuspid valve.  He received 6 weeks of intravenous daptomycin and oral rifampin.  TEE performed on 10/14/2021 at [facility] that did not show any evidence of vegetations on the prosthetic mitral valve or on the pacemaker leads and showed moderate aortic insufficiency. The mean mitral valve gradient was 6 mmHg at a heart rate of 92 bpm. He continued to feel ill, poor appetite and weight loss.  TTE 11/06/2021 showed LVEF 55-60%, moderate aortic stenosis, moderate-severe aortic insufficiency, mean MVR gradient 6 mm Hg at 79 bpm.  He underwent laser lead and generator extraction of the BiV-ICD on 11/17/2021 by Dr. Malvin Johns.  Blood cultures 12/28/2021 at [facility] - no growth.  Tunneled catheter was removed 12/22/2021      Subsequent echos >> LVEF down to 30-35% by TTE 06/13/22 >>  This admision 35-40% RV mildly reduced  LBBB  Assessment & Plan    Recurrent syncope Witnessed HRs to high 20's-30's by EMS and transcutaneously paced Treated with atropine and epi > nor epi briefly    Hx of staph bacteremia apparently involved valve and leads ICD system extracted June 2023 Now with a HD catheter to R chest  RUE AVF clotted >> IR today  MUCH discussions with cardiology (who has had extensive discussions with IR, nephrology) regarding the  best recommendation regarding his bradycardia, overall clinical picture His HR has had good recovery without recurrent bradycardia OFF the metoprolol.  Dr. Lalla Brothers today with long discussion with the patient The patient does not want to pursue valve intervention or pacing.  At this juncture off the BB, no pacing indication with recovered HR OK to continue his amiodarone in effort to avoid AF/RVR   C/w attending/primary cardiology team:  2. Paroxysmal AFib CHA2DS2Vasc is 4, >>  home OAC held heparin here Remains on amiodarone    3. VHD Severe prosthetic MV stenosis RHC as above TEE  Severe MS Mod AS w/mod-severe AI No vegetations   4. Acute/chronic CHF (systolic) Volume w/ HD LVEF by TEE this AM ~30%, mod RV dysfunction   5. NSTEMI Suspect 2/2 profound bradycardia R/LHC as above Planned for staged PCI to RCA/LCx once past fistula procedure    EP will sign off though remain available  Please recall if needed   For questions or updates, please contact [facility] Please consult [email or web address] for contact info under        Signed, Sheilah Pigeon, PA-C  02/16/2023, 8:21 AM

## 2023-02-16 NOTE — Progress Notes (Signed)
ANTICOAGULATION CONSULT NOTE  Pharmacy Consult for heparin Indication: atrial fibrillation  Allergies  Allergen Reactions   Hydrochlorothiazide Itching and Other (See Comments)    Hyponatremia   Amoxicillin-Pot Clavulanate Nausea And Vomiting   Clindamycin Nausea And Vomiting and Other (See Comments)    Chest pain   Lisinopril Other (See Comments) and Cough    hyponatremia    Meloxicam Itching and Nausea Only   Topiramate Other (See Comments)   Tamsulosin Other (See Comments)    dysuria     Patient Measurements: Height: 5\' 10"  (177.8 cm) Weight: 111.3 kg (245 lb 6 oz) IBW/kg (Calculated) : 73 Heparin Dosing Weight: 100 kg  Vital Signs: Temp: 98.2 F (36.8 C) (09/06 0724) Temp Source: Oral (09/06 0724) BP: 113/65 (09/06 0724) Pulse Rate: 88 (09/06 0828)  Labs: Recent Labs    02/13/23 1742 02/14/23 0221 02/14/23 0617 02/14/23 1417 02/14/23 1734 02/15/23 0629 02/15/23 0630 02/16/23 0221  HGB  --    < >  --    < > 11.0*  --  10.3* 11.0*  HCT  --    < >  --    < > 33.4*  --  31.9* 33.6*  PLT  --    < >  --   --  187  --  183 196  APTT 92*  --  110*  --   --   --  66* 62*  HEPARINUNFRC 0.72*  --   --   --   --   --  0.37 0.42  CREATININE  --    < >  --   --  2.79* 2.80*  --  2.48*   < > = values in this interval not displayed.    Estimated Creatinine Clearance: 34.1 mL/min (A) (by C-G formula based on SCr of 2.48 mg/dL (H)).   Medical History: Past Medical History:  Diagnosis Date   Biventricular ICD (implantable cardioverter-defibrillator) - [facility] 09/05/2015   Jul 01, 2015 [facility] Dynagen X4   CAD (coronary artery disease) 09/05/2015   CABG 2015 PCI-BMS ostial LCX Sept 2015 04/05/2015 cath 50% distal LM, 90% prox LAD, old stent prox LAD 70% ISR, patent LIMA-LAD, 80% prox RCA PCI-DES x2 RCA and LCX    Chronic systolic CHF (congestive heart failure) (HCC) 09/05/2015   a. RHC 10/29/15: PCW 8, CI 2.2. Overall low filling presssures. Mean PA 20; b. echo 10/30/15: EF  45-50%, not tech suff to allwo for LV diastolic fxn, mild AI, nl appearing MVR   CKD stage 4 due to type 2 diabetes mellitus (HCC) 09/05/2015   Diabetes mellitus, type 2 (HCC) 09/05/2015   Essential hypertension 09/05/2015   History of mitral valve replacement with bioprosthetic valve 09/05/2015   31 mm Edwards 2015, Dr. Arvilla Market   Postoperative atrial fibrillation (HCC) 09/05/2015   Assessment: 89 yoM presented to [facility] for evaluation of syncope and bradycardia from [facility]. Relevant PMH includes CAD s/p CABG/bMVR (2015) & multiple PCIs (most recent 2016), ICM w/ EF recovery (EF = 45-50%), staph IE s/p ICD extraction (2023), PAF (on Eliquis), and ESRD on HD (TTS). Pharmacy has been consulted to dose heparin for afib.  Last dose of eliquis given on 9/02 PM PTA.  Heparin level 0.42, therapeutic aPTT 62, therapeutic Current heparin infusion rate: 1300 units/hr  Hgb 11, Plt 196 - trending up and stable No issues with heparin infusion or signs of bleeding reported.   Goal of Therapy:  Heparin level 0.3-0.7 units/ml Monitor platelets by anticoagulation protocol: Yes  Plan:  Continue heparin infusion at 1300 units/hr Check heparin level daily while on heparin Continue to monitor H&H and platelets Plan for IR to de-clot AV fistula today, 9/6 F/u transition back to apixaban as per recommendations by IR after de-clotting intervention    Thank you for allowing pharmacy to be a part of this patient's care.  Wilburn Cornelia, PharmD, BCPS Clinical Pharmacist 02/16/2023 10:54 AM   Please refer to AMION for pharmacy phone number

## 2023-02-16 NOTE — Discharge Summary (Incomplete)
Discharge Summary    Patient ID: Dominic [name] MRN: [id number]; DOB: 1951-01-27  Admit date: 02/12/2023 Discharge date: 02/16/2023  PCP:  Montez Hageman, DO   [facility] Providers Cardiologist:  Thurmon Fair, MD   { Click here to update MD or APP on Care Team, Refresh:1}     Discharge Diagnoses    Principal Problem:   Symptomatic bradycardia Active Problems:   Hyperlipidemia   LBBB (left bundle branch block)   Non-ST elevation (NSTEMI) myocardial infarction [facility])   Syncope and collapse   Acute systolic heart failure (HCC)   ESRD (end stage renal disease) (HCC)   Volume overload    Diagnostic Studies/Procedures    TTE 02/13/2023: Impressions: 1. There is no left ventricular thrombus (Definity contrast was used).  There is severe septal-lateral dyssynchrony due to LBBB. Left ventricular  ejection fraction, by estimation, is 35 to 40%. The left ventricle has  moderately decreased function. The  left ventricle demonstrates global hypokinesis. The left ventricular  internal cavity size was mildly dilated. There is mild concentric left  ventricular hypertrophy. Indeterminate diastolic filling due to E-A  fusion.   2. Right ventricular systolic function is moderately reduced. The right  ventricular size is moderately enlarged. There is moderately elevated  pulmonary artery systolic pressure. The estimated right ventricular  systolic pressure is 57.3 mmHg.   3. Left atrial size was severely dilated.   4. Right atrial size was moderately dilated.   5. The mitral valve has been repaired/replaced. No evidence of mitral  valve regurgitation. Severe mitral stenosis. The mean mitral valve  gradient is 18.0 mmHg with average heart rate of 71 bpm. There is a 31 mm  Edwards Sapien bioprosthetic valve present   in the mitral position. Procedure Date: 09/05/2015. Echo findings are  consistent with stenosis the mitral prosthesis.   6. Large pleural effusion.   7.  Tricuspid valve regurgitation is moderate to severe.   8. The aortic valve is tricuspid. There is moderate calcification of the  aortic valve. There is moderate thickening of the aortic valve. Aortic  valve regurgitation is moderate. Moderate aortic valve stenosis. Aortic  regurgitation PHT measures 319 msec.  Aortic valve mean gradient measures 23.3 mmHg. Aortic valve Vmax measures  3.21 m/s.   9. Aortic dilatation noted. There is mild dilatation of the ascending  aorta, measuring 39 mm. There is mild dilatation of the aortic root,  measuring 42 mm.  10. Hepatic vein flow reversal is seen is seen intermittently (likely with  respiration, spirometry was not performed). The inferior vena cava is  dilated in size with >50% respiratory variability, suggesting right atrial  pressure of 8 mmHg.   Comparison(s): The left ventricular function is worsened. There is marked  increase in mitral valve prosthesis gradients, which may be partly related  to the increase in heart rate, but could also be due to prosthetic valve  endocarditis. Pulmonary artery  hypertension is worse, likley due to increase left heart pressures. Aortic  stenosis is only slightly worse.  _____________  Right/ Left Cardiac Catheterization 02/14/2023:   Mid LM to Ost LAD lesion is 40% stenosed.   Ramus lesion is 100% stenosed.   Ost LAD to Prox LAD lesion is 80% stenosed.  Previously placed Mid LAD lesion is 100% stenosed.   Previously placed Ost Cx to Prox Cx stent of unknown type is  widely patent.   Prox Cx to Mid Cx lesion is 65% stenosed.   1st Mrg lesion is  50% stenosed.   Previously placed Prox RCA lesion is 40% stenosed -focal ISR.   Mid RCA lesion is 85% stenosed.   Mid RCA to Dist RCA lesion is 20% stenosed.  Dist RCA lesion is 20% stenosed.   SVG-RI graft was visualized by angiography and is large.  The graft exhibits no disease.   LIMA-LAD graft was visualized by angiography and is large.  The graft exhibits no  disease. -----------------------------------------------------------   Hemodynamic findings consistent with severe pulmonary hypertension.   There is moderate aortic valve stenosis.   There is severe mitral valve stenosis.   Severe multivessel CAD: -> Distal LM 40%, ostial LAD 80% followed by short normal segment with SP and small to Diag branch then 100% occluded stent 100% ostial RI occlusion\ Widely patent ostial-proximal LCx stent with ~65% stenosis just distal to the stent and prior to OM/AVG bifurcation with trivial disease in the OM branches 40% ostial RCA with heavily calcified RCA (difficult to assess stent versus calcification. The stent appears to be in the proximal to mid segment followed by a 85% stenosis in the mid RCA. -> Widely patent large LIMA to mid LAD with retrograde filling of the 2nd Diag branch; widely patent SVG-RI Right Heart Cath/Hemodynamics: RA P 13 mmHg; RVP-EDP 65/3-11 mmHg; PAP-mean 60/35-45 mmHg, PCWP 40 to 44 mmHg (V waves appeared more prominent), LV P-EDP 150/6-14 mmHg, AO P-MAP 114/64-82 mmHg Wedge-LVEDP gradient ~30 mmHg; AoV mean gradient 35 mmHg Ao sat 88%, PA sat 56%; (FICK) Cardiac Output-Index 71.00-2.94   Recommendations: Optimization of medical and fluid management Will likely need staged imaging-guided PCI of the RCA with potential FFR guided PCI to the LCx as well => discussion with Dr. Lennie Odor, this will need to be done after having his dialysis fistula cleared and dialysis catheter removed (this would prevent having to hold Plavix).  Diagnostic Dominance: Right  _______________  TEE 02/15/2023: Impressions: 1. Left ventricular ejection fraction, by estimation, is 30%. The left  ventricle has moderately decreased function. The left ventricle  demonstrates global hypokinesis and abnormal septal motion secondary to  conduction delay.   2. Right ventricular systolic function is moderately reduced. The right  ventricular size is normal.    3. Left atrial size was severely dilated. No left atrial/left atrial  appendage thrombus was detected. The LAA emptying velocity was 25 cm/s.   4. Right atrial size was moderately dilated.   5. The mitral valve has been repaired/replaced. Mild mitral valve  regurgitation. Severe prosthetic mitral valve stenosis. Prosthetic valve  leaflets are calcified and thickened with restricted opening. No definite  valvular vegetations, no dehiscence of  the prosthesis. The mean mitral valve gradient is 13.0 mmHg at HR 83 bpm.  There is a 31 mm Edwards-Sapien bioprosthetic valve present in the mitral  position. Procedure Date: 09/05/2015. Echo findings are consistent with  stenosis the mitral prosthesis.   6. There is a calcified small lesion on the atrial aspect of the  tricuspid valve, likely anterior leaflet best seen at 45 deg (clip 86 and  90). This could represent degenerative calcium vs healed calcified  vegetation. Patient has had a previous RV lead.   7. The aortic valve is calcified. There is severe calcifcation of the  aortic valve. There is moderate thickening of the aortic valve. Aortic  valve regurgitation is moderate, vena contracta 0.5 cm. Moderate aortic  valve stenosis. Aortic valve area, by  VTI measures 1.32 cm. Aortic valve mean gradient measures 24.0 mmHg.  Aortic valve  Vmax measures 3.11 m/s.   8. Large pleural effusion.   9. Aortic dilatation noted. There is mild dilatation of the ascending  aorta, measuring 42 mm. There is Moderate (Grade III) plaque.      History of Present Illness     Dominic [name] is a 72 y.o. 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  Patient reported he was at a family reunion on Sunday 02/11/2023 when he stared feeling "shaky" and then lost consciousness and hit hit head. He was unsure how long he was unconscious for but noted feeling short of breath, lightheaded, and nauseous with associated emesis when he woke up. He also reported low level 3/10 non-radiating chest aching. EMS was called and upon their arrival heart rate was in the 30s and systolic BP was 41. He was given Epinephrine and was externally paced. Of note, the patient did report he had another syncopal episode about 1 week prior to admission after completing dialysis. He estimated he was unconscious for about 1 minute at that time and did not seek medical attention. He also reported a non-productive cough, rhinorrhea and chronic lower extremity swelling. He denied fevers, chills, abdominal pain, PND, orthopnea, palpitations.   He initially presented to the [facility] ED. Upon his initial arrival, his vital signs ad normalized - BP was 135/69 and heart rate was in the 90s without pacing. EKG showed normal sinus rhythm with known LBBB. High-sensitivity troponin elevated at 1,359 >> 1,579 >> 2,105. BNP elevated at 1,150. His potassium was 5 and he was treated for hyperkalemia. Otherwise, Initial labs were notable for WBC 7.6, Hgb 10.6, Na 133, Cr 3.41, Glucose 188. It was unclear if any further interventions were performed at outside hospital. Given that he had this syncopal event and the outside hospital did not have EP, he was transferred to [facility] for a higher level of care.   On arrival to [facility], vital signs were all within normal limits. EKG showed normal sinus rhythm with 1st degree AVB and LBBB. Chest x-ray showed bilateral pulmonary edema. His only complaint upon arrival to [facility] was  shortness of breath but he denied any other symptoms including chest pain.  Of note, he has a long smoking history and currently smokes 10-12 cigarettes per day. He denied any alcohol or drug use.     Hospital Course     Consultants: EP, Nephrology   Syncope  Symptomatic Bradycardia Patient was admitted with syncope and symptomatic bradycardia with heart rates as low as the 30s in the field requiring treatment with Atropine, Epinephrine >> Norepinephrine (briefly), and transcutaneous pacing with quick recovery.  Heart rates normalized by the time he arrived to the ED.  EP was consulted.  He previously had CRT-D but this was extracted in 11/2021 in the setting of staph bacteremia that involved his prosthetic valve and ICD leads.  There was initial consideration for possible placement of a leadless pacemaker given given right upper extremity fistula prohibiting right sided device implant and indwelling HD line making reimplant on the previously infected left  chest even higher risk.  However, his heart rate stabilized and he had no recurrent bradycardia when his home metoprolol was stopped.  He was ultimately not felt to be a good candidate for a temporary pacemaker, CRT-D reimplantation, or Micra device.  Given high risk of infection and improvement in heart rate off metoprolol, EP recommended following this conservatively.  Will avoid all AV nodal agents.  Acute on Chronic Combined Patient has a history of chronic combined CHF with EF as low as 30% in the past.  EF subsequently normalized after treatment with CRT-D.  Fortunately his ICD had to be explanted and sick/2023 due to endocarditis.  Since then, he has had a drop in his EF.  Echo this admission showed LVEF of 35-40% with global hypokinesis and severe septal-lateral dyssynchrony due to LBBB as well as moderately enlarged RV with moderately reduced systolic function and moderately elevated PASP of 57.3 mmHg.  R/LHC showed severe multivessel disease  with patent LIMA to LAD and SVG to RI but 85% stenosis of mid RCA and 65% of LCX.  LVEDP gradient was around 30 mmHg in PCWP was 40-44 mmHg.  Medical therapy was recommended with likely need for a staged imaging guided PCI of the RCA and potential FFR guided PCI to the LCx after dialysis fistula cleared and catheter removed (given Plavix would need to be held for this).  His volume status was managed by dialysis.  GDMT limited by his ERCP.  Continue Hydralazine *** and Imdur ***.  No beta-blocker due to bradycardia as above. Of note, the drop in his EF was felt to be more likely due to dyssynchrony from his LBBB than  fromhis CAD (although could be a mixed picture)  CAD Type 2 NSTEMI (Demand Ischemia ***) Patient has a history of CAD with CABG in 2015 and multiple PCIs.  High-sensitivity opponent was elevated this admission and peaked at 2, 105.  Echo showed a drop in his EF to 35-40% with global hypokinesis.  LHC showed severe multivessel disease with pain in LIMA to LAD and SVG to RI but 85% stenosis of mid RCA and 65% stenosis of LCx. Medical therapy was recommended for this given ongoing needs for procedures for his dialysis access.  Continue aspirin and high sensitivity statin.    Moderate Aortic Stenosis Moderate to Severe Aortic Insufficiency S/p Bioprosthetic MVR with Severe Stenosis TTE this admission showed s/p bioprosthetic MVR with severe mitral stenosis (gradient 18 mL per mercury), moderate to severe tricuspid regurgitation, moderate aortic stenosis/insufficiency (mean gradient 23.3 mmHg).  TEE confirms severe stenosis of bioprosthetic mitral valve and a moderate aortic stenosis and regurgitation.  He was initially not a candidate for structural heart procedures due to his indwelling HD catheter. ***    Did the patient have an acute coronary syndrome (MI, NSTEMI, STEMI, etc) this admission?:  {Press F2   :[id number]}   {Are you ordering a CV Procedure (e.g. stress test, cath, DCCV, TEE,  etc) to be done as an outpatient?   Press F2        :[id number]}   {Does this patient need a TOC appointment?:[id number]} _____________  Discharge Vitals Blood pressure 114/64, pulse 98, temperature 98.4 F (36.9 C), temperature source Oral, resp. rate 15, height 5\' 10"  (1.778 m), weight 111.3 kg, SpO2 98%.  Filed Weights   02/15/23 1000 02/15/23 1452 02/16/23 0417  Weight: 115.4 kg 112.8 kg 111.3 kg    Labs & Radiologic Studies    CBC Recent Labs    02/15/23  0630 02/16/23 0221  WBC 5.8 5.8  HGB 10.3* 11.0*  HCT 31.9* 33.6*  MCV 88.6 87.3  PLT 183 196   Basic Metabolic Panel Recent Labs    56/21/30 0629 02/16/23 0221  NA 133* 135  K 3.9 3.6  CL 100 96*  CO2 19* 24  GLUCOSE 122* 139*  BUN 43* 27*  CREATININE 2.80* 2.48*  CALCIUM 8.4* 8.6*  PHOS 3.7  --    Liver Function Tests Recent Labs    02/15/23 0629  ALBUMIN 2.3*   No results for input(s): "LIPASE", "AMYLASE" in the last 72 hours. High Sensitivity Troponin:   Recent Labs  Lab 02/12/23 0003 02/13/23 0002  TROPONINIHS 1,099* 1,089*    BNP Invalid input(s): "POCBNP" D-Dimer No results for input(s): "DDIMER" in the last 72 hours. Hemoglobin A1C No results for input(s): "HGBA1C" in the last 72 hours. Fasting Lipid Panel No results for input(s): "CHOL", "HDL", "LDLCALC", "TRIG", "CHOLHDL", "LDLDIRECT" in the last 72 hours. Thyroid Function Tests No results for input(s): "TSH", "T4TOTAL", "T3FREE", "THYROIDAB" in the last 72 hours.  Invalid input(s): "FREET3" _____________  IR DIALY SHUNT INTRO NEEDLE/INTRACATH INITIAL W/IMG RIGHT  Result Date: 02/16/2023 CLINICAL DATA:  End stage renal disease, difficult access of right upper arm hemodialysis fistula EXAM: DIALYSIS SHUNTOGRAM FLUOROSCOPY: Radiation Exposure Index (as provided by the fluoroscopic device): 3 mGy air Kerma COMPARISON:  None Available. TECHNIQUE: An 18-gauge angiocatheter was placed antegrade into the outflow vein of the patient's AV  hemodialysis fistula for dialysis fistulography. The angiocatheter and surrounding skin were then prepped with Betadine, draped in usual sterile fashion, infiltrated locally with 1% lidocaine the angiocatheter was exchanged over a Benson wire for a 6 [place] vascular sheath, through which a 5 French angled angiographic catheter was advanced in attempts to traverse central vein occlusion, which were ultimately unsuccessful. Once guidewire perforation occurred, attempts were terminated. After follow-up venography, the catheter, sheath, and guidewire were removed and hemostasis achieved with a 2-0 Ethilon purse-string suture. No immediate complication. FINDINGS: Patent brachiocephalic fistula above the elbow. The outflow vein occludes at the mid humeral shaft level, with no significant competing outflow veins. The outflow vein occlusion could not be traversed to reestablish good antegrade flow through the fistula. IMPRESSION: 1. Patent brachiocephalic fistula, with occlusion of the outflow vein at the level of the mid humeral shaft which could not be traversed. ACCESS: Not approachable for percutaneous intervention. Consider surgical consultation. Electronically Signed   By: Corlis Leak M.D.   On: 02/16/2023 18:01   ECHO TEE  Result Date: 02/15/2023    TRANSESOPHOGEAL ECHO REPORT   Patient Name:   Dominic [name] Date of Exam: 02/15/2023 Medical Rec #:  [id number]      Height:       70.0 in Accession #:    [id number]     Weight:       250.7 lb Date of Birth:  1951/03/21      BSA:          2.298 m Patient Age:    72 years       BP:           124/75 mmHg Patient Gender: M              HR:           87 bpm. Exam Location:  Inpatient Procedure: Transesophageal Echo, Color Doppler, Cardiac Doppler and 3D Echo Indications:    Prosthetic mitral valve stenosis  History:  Patient has prior history of Echocardiogram examinations, most                 recent 02/13/2023. CHF, CAD, Defibrillator, Mitral Valve Disease,                  Mitral Stenosis and Aortic Valve Disease, Arrythmias:Atrial                 Fibrillation and LBBB; Risk Factors:Diabetes and Hypertension.                 CKD.                  Mitral Valve: 31 mm Edwards-Sapien bioprosthetic valve valve is                 present in the mitral position. Procedure Date: 09/05/2015.  Sonographer:    Milda Smart Referring Phys: [id number] DAYNA N DUNN PROCEDURE: After discussion of the risks and benefits of a TEE, an informed consent was obtained from the patient. TEE procedure time was 42 minutes. The transesophogeal probe was passed without difficulty through the esophogus of the patient. Imaged were obtained with the patient in a left lateral decubitus position. Local oropharyngeal anesthetic was provided with Cetacaine. Sedation performed by different physician. The patient was monitored while under deep sedation. Anesthestetic sedation was provided intravenously by Anesthesiology: 436.61mg  of Propofol. Image quality was excellent. The patient's vital signs; including heart rate, blood pressure, and oxygen saturation; remained stable throughout the procedure. The patient developed no complications during the procedure.  IMPRESSIONS  1. Left ventricular ejection fraction, by estimation, is 30%. The left ventricle has moderately decreased function. The left ventricle demonstrates global hypokinesis and abnormal septal motion secondary to conduction delay.  2. Right ventricular systolic function is moderately reduced. The right ventricular size is normal.  3. Left atrial size was severely dilated. No left atrial/left atrial appendage thrombus was detected. The LAA emptying velocity was 25 cm/s.  4. Right atrial size was moderately dilated.  5. The mitral valve has been repaired/replaced. Mild mitral valve regurgitation. Severe prosthetic mitral valve stenosis. Prosthetic valve leaflets are calcified and thickened with restricted opening. No definite valvular vegetations, no  dehiscence of the prosthesis. The mean mitral valve gradient is 13.0 mmHg at HR 83 bpm. 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 Procedure Date: 09/05/2015. Echo findings are consistent with stenosis the mitral prosthesis.  6. There is a calcified small lesion on the atrial aspect of the tricuspid valve, likely anterior leaflet best seen at 45 deg (clip 86 and 90). This could represent degenerative calcium vs healed calcified vegetation. Patient has had a previous RV lead.  7. The aortic valve is calcified. There is severe calcifcation of the aortic valve. There is moderate thickening of the aortic valve. Aortic valve regurgitation is moderate, vena contracta 0.5 cm. Moderate aortic valve stenosis. Aortic valve area, by VTI measures 1.32 cm. Aortic valve mean gradient measures 24.0 mmHg. Aortic valve Vmax measures 3.11 m/s.  8. Large pleural effusion.  9. Aortic dilatation noted. There is mild dilatation of the ascending aorta, measuring 42 mm. There is Moderate (Grade III) plaque. FINDINGS  Left Ventricle: Left ventricular ejection fraction, by estimation, is 30%. The left ventricle has moderately decreased function. The left ventricle demonstrates global hypokinesis. The left ventricular internal cavity size was normal in size. Concentric  left ventricular hypertrophy. Abnormal (paradoxical) septal motion, consistent with left bundle branch block. Right  Ventricle: The right ventricular size is normal. Right vetricular wall thickness was not well visualized. Right ventricular systolic function is moderately reduced. Left Atrium: Left atrial size was severely dilated. No left atrial/left atrial appendage thrombus was detected. The LAA emptying velocity was 25 cm/s. Right Atrium: Right atrial size was moderately dilated. Pericardium: Trivial pericardial effusion is present. Mitral Valve: The mitral valve has been repaired/replaced. Mild mitral valve regurgitation. There is a  31 mm Edwards-Sapien bioprosthetic valve present in the mitral position. Procedure Date: 09/05/2015. 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 Severe mitral valve stenosis. MV peak gradient, 16.8 mmHg. The mean mitral valve gradient is 13.0 mmHg with average heart rate of 87 bpm. Tricuspid Valve: There is a calcified small lesion on the atrial aspect of the tricuspid valve, likely anterior leaflet best seen at 45 deg (clip 86 and 90). This could represent degenerative calcium vs healed calcified vegetation. Patient has had a previous RV lead. The tricuspid valve is grossly normal. Tricuspid valve regurgitation is mild. Aortic Valve: AV vena contracta 0.5 cm. The aortic valve is calcified. There is severe calcifcation of the aortic valve. There is moderate thickening of the aortic valve. Aortic valve regurgitation is moderate. Aortic regurgitation PHT measures 297 msec.  Moderate aortic stenosis is present. Aortic valve mean gradient measures 24.0 mmHg. Aortic valve peak gradient measures 38.7 mmHg. Aortic valve area, by VTI measures 1.32 cm. There is no evidence of aortic valve vegetation. Pulmonic Valve: The pulmonic valve was not well visualized. Pulmonic valve regurgitation is mild. Aorta: The aortic root is normal in size and structure and aortic dilatation noted. There is mild dilatation of the ascending aorta, measuring 42 mm. There is moderate (Grade III) plaque. IAS/Shunts: No atrial level shunt detected by color flow Doppler. Additional Comments: There is a large pleural effusion. Spectral Doppler performed. LEFT VENTRICLE PLAX 2D LVOT diam:     2.20 cm LV SV:         82 LV SV Index:   36 LVOT Area:     3.80 cm  AORTIC VALVE AV Area (Vmax):    1.28 cm AV Area (Vmean):   1.20 cm AV Area (VTI):     1.32 cm AV Vmax:           311.00 cm/s AV Vmean:          235.000 cm/s AV VTI:            0.627 m AV Peak Grad:      38.7 mmHg AV Mean Grad:      24.0 mmHg LVOT Vmax:         105.00  cm/s LVOT Vmean:        73.900 cm/s LVOT VTI:          0.217 m LVOT/AV VTI ratio: 0.35 AI PHT:            297 msec  AORTA Ao Root diam: 3.70 cm Ao Asc diam:  4.20 cm MITRAL VALVE MV Peak grad: 16.8 mmHg  SHUNTS MV Mean grad: 13.0 mmHg  Systemic VTI:  0.22 m MV Vmax:      2.05 m/s   Systemic Diam: 2.20 cm MV Vmean:     174.0 cm/s Weston Brass MD Electronically signed by Weston Brass MD Signature Date/Time: 02/15/2023/1:45:56 PM    Final    EP STUDY  Result Date: 02/15/2023 See surgical note for result.  CARDIAC CATHETERIZATION  Result Date: 02/15/2023   Mid LM to [facility]  LAD lesion is 40% stenosed.   Ramus lesion is 100% stenosed.   Ost LAD to Prox LAD lesion is 80% stenosed.  Previously placed Mid LAD lesion is 100% stenosed.   Previously placed Ost Cx to Prox Cx stent of unknown type is  widely patent.   Prox Cx to Mid Cx lesion is 65% stenosed.   1st Mrg lesion is 50% stenosed.   Previously placed Prox RCA lesion is 40% stenosed -focal ISR.   Mid RCA lesion is 85% stenosed.   Mid RCA to Dist RCA lesion is 20% stenosed.  Dist RCA lesion is 20% stenosed.   SVG-RI graft was visualized by angiography and is large.  The graft exhibits no disease.   LIMA-LAD graft was visualized by angiography and is large.  The graft exhibits no disease.   ------------------------------------------------------------   Hemodynamic findings consistent with severe pulmonary hypertension.   There is moderate aortic valve stenosis.   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 The stent appears to be in the proximal to mid  segment followed by a 85% stenosis in the mid RCA. -> Widely patent large LIMA to mid LAD with retrograde filling of the 2nd Diag branch; widely patent SVG-RI Right Heart Cath/Hemodynamics: RA P 13 mmHg; RVP-EDP 65/3-11 mmHg; PAP-mean 60/35-45 mmHg, PCWP 40 to 44 mmHg (V waves appeared more prominent), LV P-EDP 150/6-14 mmHg, AO P-MAP 114/64-82 mmHg Wedge-LVEDP gradient ~30 mmHg; AoV mean gradient 35 mmHg Ao sat 88%, PA sat 56%; (FICK) Cardiac Output-Index 71.00-2.94 RECOMMENDATIONS Optimization of medical and fluid management Will likely need staged imaging-guided PCI of the RCA with potential FFR guided PCI to the LCx as well => discussion with Dr. Lennie Odor, this will need to be done after having his dialysis fistula cleared and dialysis catheter removed (this would prevent having to hold Plavix). Bryan Lemma, MD  ECHOCARDIOGRAM COMPLETE  Result Date: 02/13/2023    ECHOCARDIOGRAM REPORT   Patient Name:   Dominic [name] Date of Exam: 02/13/2023 Medical Rec #:  [id number]      Height:       70.0 in Accession #:    [id number]     Weight:       258.8 lb Date of Birth:  1950-08-02      BSA:          2.329 m Patient Age:    72 years       BP:           124/76 mmHg Patient Gender: M              HR:           94 bpm. Exam Location:  Inpatient Procedure: 2D Echo, Color Doppler, Cardiac Doppler and Intracardiac            Opacification Agent Indications:    I44.7 LBBB  History:        Patient has prior history of Echocardiogram examinations, most                 recent 01/23/2022. CHF, Abnormal ECG, Defibrillator and Prior  CABG, COPD, Endocarditis, Mitral Stenosis, Mitral Valve Disease                 and Aortic Valve Disease, Arrythmias:LBBB,                 Signs/Symptoms:Syncope; Risk Factors:Current Smoker and                 Hypertension. ESRD. MVR.                  Mitral Valve: 31 mm Edwards Sapien bioprosthetic valve valve is                 present in the mitral position. Procedure Date:  09/05/2015.  Sonographer:    Sheralyn Boatman RDCS Referring Phys: [id number] [facility]  Sonographer Comments: Technically difficult study due to poor echo windows. IMPRESSIONS  1. There is no left ventricular thrombus (Definity contrast was used). There is severe septal-lateral dyssynchrony due to LBBB. Left ventricular ejection fraction, by estimation, is 35 to 40%. The left ventricle has moderately decreased function. The left ventricle demonstrates global hypokinesis. The left ventricular internal cavity size was mildly dilated. There is mild concentric left ventricular hypertrophy. Indeterminate diastolic filling due to E-A fusion.  2. Right ventricular systolic function is moderately reduced. The right ventricular size is moderately enlarged. There is moderately elevated pulmonary artery systolic pressure. The estimated right ventricular systolic pressure is 57.3 mmHg.  3. Left atrial size was severely dilated.  4. Right atrial size was moderately dilated.  5. The mitral valve has been repaired/replaced. No evidence of mitral valve regurgitation. Severe mitral stenosis. The mean mitral valve gradient is 18.0 mmHg with average heart rate of 71 bpm. There is a 31 mm Edwards Sapien bioprosthetic valve present  in the mitral position. Procedure Date: 09/05/2015. Echo findings are consistent with stenosis the mitral prosthesis.  6. Large pleural effusion.  7. Tricuspid valve regurgitation is moderate to severe.  8. The aortic valve is tricuspid. There is moderate calcification of the aortic valve. There is moderate thickening of the aortic valve. Aortic valve regurgitation is moderate. Moderate aortic valve stenosis. Aortic regurgitation PHT measures 319 msec. Aortic valve mean gradient measures 23.3 mmHg. Aortic valve Vmax measures 3.21 m/s.  9. Aortic dilatation noted. There is mild dilatation of the ascending aorta, measuring 39 mm. There is mild dilatation of the aortic root, measuring 42 mm. 10. Hepatic vein flow  reversal is seen is seen intermittently (likely with respiration, spirometry was not performed). The inferior vena cava is dilated in size with >50% respiratory variability, suggesting right atrial pressure of 8 mmHg. Comparison(s): The left ventricular function is worsened. There is marked increase in mitral valve prosthesis gradients, which may be partly related to the increase in heart rate, but could also be due to prosthetic valve endocarditis. Pulmonary artery hypertension is worse, likley due to increase left heart pressures. Aortic stenosis is only slightly worse. 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 Consider TEE for evaluation for endocarditis of the mitral valve prosthesis. Consider a component of constrictive physiology (evaluate mitral flow and hepatic vein flow with spirometry). FINDINGS  Left Ventricle: There is no left ventricular thrombus (Definity contrast was used). There is severe septal-lateral dyssynchrony due to LBBB. Left ventricular ejection fraction, by estimation, is 35 to 40%. The left ventricle has moderately decreased function. The left ventricle demonstrates global hypokinesis. Definity contrast agent was given IV to delineate the left ventricular endocardial borders. The left ventricular  internal cavity size was mildly dilated. There is mild concentric left ventricular hypertrophy. Abnormal (paradoxical) septal motion, consistent with left bundle branch block. Indeterminate diastolic filling due to E-A fusion. Right Ventricle: The right ventricular size is moderately enlarged. Right vetricular wall thickness was not well visualized. Right ventricular systolic function is moderately reduced. There is moderately elevated pulmonary artery systolic pressure. The tricuspid regurgitant velocity is 3.51 m/s, and with an assumed right atrial pressure of 8 mmHg, the estimated right ventricular systolic pressure is 57.3 mmHg. Left Atrium:  Left atrial size was severely dilated. Right Atrium: Right atrial size was moderately dilated. Pericardium: There is no evidence of pericardial effusion. Mitral Valve: The mitral valve has been repaired/replaced. No evidence of mitral valve regurgitation. There is a 31 mm Edwards Sapien bioprosthetic valve present in the mitral position. Procedure Date: 09/05/2015. 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 Severe mitral valve stenosis. MV peak gradient, 25.6 mmHg. The mean mitral valve gradient is 18.0 mmHg with average heart rate of 71 bpm. Tricuspid Valve: The tricuspid valve is grossly normal. Tricuspid valve regurgitation is moderate to severe. No evidence of tricuspid stenosis. Aortic Valve: The aortic valve is tricuspid. There is moderate calcification of the aortic valve. There is moderate thickening of the aortic valve. Aortic valve regurgitation is moderate. Aortic regurgitation PHT measures 319 msec. Moderate aortic stenosis is present. Aortic valve mean gradient measures 23.3 mmHg. Aortic valve peak gradient measures 41.1 mmHg. Aortic valve area, by VTI measures 1.26 cm. Pulmonic Valve: The pulmonic valve was grossly normal. Pulmonic valve regurgitation is not visualized. No evidence of pulmonic stenosis. Aorta: Aortic dilatation noted. There is mild dilatation of the ascending aorta, measuring 39 mm. There is mild dilatation of the aortic root, measuring 42 mm. Venous: Hepatic vein flow reversal is seen is seen intermittently (likely with respiration, spirometry was not performed). The inferior vena cava is dilated in size with greater than 50% respiratory variability, suggesting right atrial pressure of 8 mmHg. The inferior vena cava and the hepatic vein show a pattern of systolic flow reversal, suggestive of tricuspid regurgitation. IAS/Shunts: No atrial level shunt detected by color flow Doppler. Additional Comments: There is a large pleural effusion.  LEFT VENTRICLE PLAX 2D  LVIDd:         6.00 cm      Diastology LVIDs:         4.80 cm      LV e' medial:  3.48 cm/s LV PW:         1.30 cm      LV e' lateral: 7.40 cm/s LV IVS:        1.40 cm LVOT diam:     2.20 cm LV SV:         71 LV SV Index:   30 LVOT Area:     3.80 cm  LV Volumes (MOD) LV vol d, MOD A2C: 142.5 ml LV vol d, MOD A4C: 162.0 ml LV vol s, MOD A2C: 87.3 ml LV vol s, MOD A4C: 101.4 ml LV SV MOD A2C:     55.2 ml LV SV MOD A4C:     162.0 ml LV SV MOD BP:      57.0 ml RIGHT VENTRICLE            IVC RV S prime:     6.31 cm/s  IVC diam: 2.70 cm TAPSE (M-mode): 1.0 cm LEFT ATRIUM             Index  RIGHT ATRIUM           Index LA diam:        5.20 cm 2.23 cm/m   RA Area:     16.40 cm LA Vol (A2C):   53.9 ml 23.14 ml/m  RA Volume:   43.90 ml  18.85 ml/m LA Vol (A4C):   70.0 ml 30.06 ml/m LA Biplane Vol: 65.2 ml 28.00 ml/m  AORTIC VALVE AV Area (Vmax):    1.27 cm AV Area (Vmean):   1.23 cm AV Area (VTI):     1.26 cm AV Vmax:           320.67 cm/s AV Vmean:          222.000 cm/s AV VTI:            0.559 m AV Peak Grad:      41.1 mmHg AV Mean Grad:      23.3 mmHg LVOT Vmax:         107.00 cm/s LVOT Vmean:        71.800 cm/s LVOT VTI:          0.186 m LVOT/AV VTI ratio: 0.33 AI PHT:            319 msec  AORTA Ao Root diam: 4.20 cm Ao Asc diam:  4.05 cm MITRAL VALVE             TRICUSPID VALVE MV Peak grad: 25.6 mmHg  TR Peak grad:   49.3 mmHg MV Mean grad: 18.0 mmHg  TR Vmax:        351.00 cm/s MV Vmax:      2.53 m/s MV Vmean:     204.5 cm/s SHUNTS                          Systemic VTI:  0.19 m                          Systemic Diam: 2.20 cm Rachelle Hora Croitoru MD Electronically signed by Thurmon Fair MD Signature Date/Time: 02/13/2023/2:17:28 PM    Final    DG CHEST PORT 1 VIEW  Result Date: 02/12/2023 CLINICAL DATA:  CHF. EXAM: PORTABLE CHEST 1 VIEW COMPARISON:  12/27/2022 FINDINGS: Right internal jugular catheter tip overlies the atrial caval junction. Heart size upper normal, prosthetic cardiac valve. Post median  sternotomy. Worsening pulmonary edema. Suspected bilateral pleural effusions, left greater than right. No pneumothorax. IMPRESSION: Worsening pulmonary edema. Suspected bilateral pleural effusions, left greater than right. Electronically Signed   By: Narda Rutherford M.D.   On: 02/12/2023 23:55   Disposition   Pt is being discharged home today in good condition.  Follow-up Plans & Appointments        Discharge Medications   Allergies as of 02/16/2023       Reactions   Hydrochlorothiazide Itching, Other (See Comments)   Hyponatremia   Amoxicillin-pot Clavulanate Nausea And Vomiting   Clindamycin Nausea And Vomiting, Other (See Comments)   Chest pain   Lisinopril Other (See Comments), Cough   hyponatremia   Meloxicam Itching, Nausea Only   Topiramate Other (See Comments)   Tamsulosin Other (See Comments)   dysuria     Med Rec must be completed prior to using this SMARTLINK***          Outstanding Labs/Studies   ***  Duration of Discharge Encounter   Greater than 30 minutes including physician time.  Signed, Carmie Kanner  Jamey Ripa, PA-C 02/16/2023, 7:53 PM

## 2023-02-16 NOTE — Progress Notes (Signed)
Mobility Specialist Progress Note:   02/16/23 1000  Oxygen Therapy  O2 Device Nasal Cannula  O2 Flow Rate (L/min) 4 L/min  Mobility  Activity Ambulated with assistance in hallway  Level of Assistance Contact guard assist, steadying assist  Assistive Device Front wheel walker  Distance Ambulated (ft) 250 ft  Activity Response Tolerated well  Mobility Referral Yes  $Mobility charge 1 Mobility  Mobility Specialist Start Time (ACUTE ONLY) 1017  Mobility Specialist Stop Time (ACUTE ONLY) 1038  Mobility Specialist Time Calculation (min) (ACUTE ONLY) 21 min    Pre Mobility: 96 HR,  91% SpO2 During Mobility: 130 HR,  90% SpO2 Post Mobility:  104 HR,  91% SpO2  Pt received in bed, agreeable to mobility. A little irritable and wanting to go home. Took x2 standing rest breaks d/t fatigue and SOB. VSS throughout. Pt left in bed with call bell and all needs met.  D'Vante Earlene Plater Mobility Specialist Please contact via Special educational needs teacher or Rehab office at [phone]

## 2023-02-17 ENCOUNTER — Other Ambulatory Visit (HOSPITAL_COMMUNITY): Payer: Self-pay

## 2023-02-17 ENCOUNTER — Telehealth: Payer: Self-pay | Admitting: Student

## 2023-02-17 DIAGNOSIS — Z515 Encounter for palliative care: Secondary | ICD-10-CM

## 2023-02-17 DIAGNOSIS — R001 Bradycardia, unspecified: Secondary | ICD-10-CM | POA: Diagnosis not present

## 2023-02-17 DIAGNOSIS — I050 Rheumatic mitral stenosis: Secondary | ICD-10-CM

## 2023-02-17 DIAGNOSIS — R55 Syncope and collapse: Secondary | ICD-10-CM | POA: Diagnosis not present

## 2023-02-17 DIAGNOSIS — Z952 Presence of prosthetic heart valve: Secondary | ICD-10-CM

## 2023-02-17 DIAGNOSIS — I351 Nonrheumatic aortic (valve) insufficiency: Secondary | ICD-10-CM | POA: Insufficient documentation

## 2023-02-17 DIAGNOSIS — I5043 Acute on chronic combined systolic (congestive) and diastolic (congestive) heart failure: Secondary | ICD-10-CM

## 2023-02-17 DIAGNOSIS — Z7189 Other specified counseling: Secondary | ICD-10-CM | POA: Diagnosis not present

## 2023-02-17 DIAGNOSIS — I350 Nonrheumatic aortic (valve) stenosis: Secondary | ICD-10-CM

## 2023-02-17 LAB — BASIC METABOLIC PANEL
Anion gap: 14 (ref 5–15)
BUN: 37 mg/dL — ABNORMAL HIGH (ref 8–23)
CO2: 21 mmol/L — ABNORMAL LOW (ref 22–32)
Calcium: 8.4 mg/dL — ABNORMAL LOW (ref 8.9–10.3)
Chloride: 96 mmol/L — ABNORMAL LOW (ref 98–111)
Creatinine, Ser: 3.19 mg/dL — ABNORMAL HIGH (ref 0.61–1.24)
GFR, Estimated: 20 mL/min — ABNORMAL LOW (ref >60)
Glucose, Bld: 122 mg/dL — ABNORMAL HIGH (ref 70–99)
Potassium: 3.3 mmol/L — ABNORMAL LOW (ref 3.5–5.1)
Sodium: 131 mmol/L — ABNORMAL LOW (ref 135–145)

## 2023-02-17 LAB — CBC
HCT: 33.2 % — ABNORMAL LOW (ref 39.0–52.0)
Hemoglobin: 10.8 g/dL — ABNORMAL LOW (ref 13.0–17.0)
MCH: 28.3 pg (ref 26.0–34.0)
MCHC: 32.5 g/dL (ref 30.0–36.0)
MCV: 87.1 fL (ref 80.0–100.0)
Platelets: 197 10*3/uL (ref 150–400)
RBC: 3.81 MIL/uL — ABNORMAL LOW (ref 4.22–5.81)
RDW: 17.5 % — ABNORMAL HIGH (ref 11.5–15.5)
WBC: 5.7 10*3/uL (ref 4.0–10.5)
nRBC: 0 % (ref 0.0–0.2)

## 2023-02-17 LAB — GLUCOSE, CAPILLARY
Glucose-Capillary: 135 mg/dL — ABNORMAL HIGH (ref 70–99)
Glucose-Capillary: 191 mg/dL — ABNORMAL HIGH (ref 70–99)
Glucose-Capillary: 160 mg/dL — ABNORMAL HIGH (ref 70–99)

## 2023-02-17 LAB — HEPARIN LEVEL (UNFRACTIONATED): Heparin Unfractionated: 0.44 [IU]/mL (ref 0.30–0.70)

## 2023-02-17 MED ORDER — CLOPIDOGREL BISULFATE 75 MG PO TABS
75.0000 mg | ORAL_TABLET | Freq: Every day | ORAL | 3 refills | Status: AC
Start: 2023-02-17 — End: 2024-02-17
  Filled 2023-02-17: qty 90, 90d supply, fill #0

## 2023-02-17 MED ORDER — POTASSIUM CHLORIDE CRYS ER 20 MEQ PO TBCR
20.0000 meq | EXTENDED_RELEASE_TABLET | Freq: Two times a day (BID) | ORAL | Status: DC
  Administered 2023-02-17: 20 meq via ORAL
  Filled 2023-02-17: qty 1

## 2023-02-17 MED ORDER — MIDODRINE HCL 10 MG PO TABS
10.0000 mg | ORAL_TABLET | ORAL | 3 refills | Status: AC
  Filled 2023-02-17: qty 12, 28d supply, fill #0

## 2023-02-17 MED ORDER — HEPARIN SODIUM (PORCINE) 1000 UNIT/ML IJ SOLN
3800.0000 [IU] | Freq: Once | INTRAMUSCULAR | Status: AC
  Administered 2023-02-17: 3800 [IU]
  Filled 2023-02-17: qty 4

## 2023-02-17 MED ORDER — HYDRALAZINE HCL 10 MG PO TABS
10.0000 mg | ORAL_TABLET | Freq: Two times a day (BID) | ORAL | 2 refills | Status: AC
  Filled 2023-02-17: qty 30, 15d supply, fill #0

## 2023-02-17 MED ORDER — ISOSORBIDE MONONITRATE ER 30 MG PO TB24
30.0000 mg | ORAL_TABLET | Freq: Every day | ORAL | 2 refills | Status: AC
  Filled 2023-02-17: qty 30, 30d supply, fill #0

## 2023-02-17 MED ORDER — SUCRALFATE 1 G PO TABS: 1.0000 g | ORAL_TABLET | Freq: Two times a day (BID) | ORAL | Status: AC

## 2023-02-17 NOTE — Discharge Summary (Signed)
Discharge Summary    Patient ID: Dominic [name] MRN: [id number]; DOB: December 16, 1950  Admit date: 02/12/2023 Discharge date: 02/17/2023  PCP:  Montez Hageman, DO   [facility] Providers Cardiologist:  Thurmon Fair, MD   {  Discharge Diagnoses    Principal Problem:   Syncope and collapse Active Problems:   Symptomatic bradycardia   Acute on chronic combined systolic and diastolic CHF (congestive heart failure) (HCC)   CAD s/p CABG and multiple PCIs   LBBB (left bundle branch block)   Moderate aortic stenosis   Moderate aortic insufficiency   S/P MVR (mitral valve replacement)   Severe mitral valve stenosis   Paroxysmal atrial fibrillation (HCC)   Essential hypertension   Diabetes mellitus, type 2 (HCC)   Hyperlipidemia   COPD (chronic obstructive pulmonary disease) (HCC)   Non-ST elevation (NSTEMI) myocardial infarction (HCC)   ESRD (end stage renal disease) (HCC)    Diagnostic Studies/Procedures   TTE 02/13/2023: Impressions: 1. There is no left ventricular thrombus (Definity contrast was used).  There is severe septal-lateral dyssynchrony due to LBBB. Left ventricular  ejection fraction, by estimation, is 35 to 40%. The left ventricle has  moderately decreased function. The  left ventricle demonstrates global hypokinesis. The left ventricular  internal cavity size was mildly dilated. There is mild concentric left  ventricular hypertrophy. Indeterminate diastolic filling due to E-A  fusion.   2. Right ventricular systolic function is moderately reduced. The right  ventricular size is moderately enlarged. There is moderately elevated  pulmonary artery systolic pressure. The estimated right ventricular  systolic pressure is 57.3 mmHg.   3. Left atrial size was severely dilated.   4. Right atrial size was moderately dilated.   5. The mitral valve has been repaired/replaced. No evidence of mitral  valve regurgitation. Severe mitral stenosis. The mean mitral  valve  gradient is 18.0 mmHg with average heart rate of 95 bpm. There is a 31 mm  Edwards Sapien bioprosthetic valve present   in the mitral position. Procedure Date: 09/05/2015. Echo findings are  consistent with stenosis the mitral prosthesis.   6. Large pleural effusion.   7. Tricuspid valve regurgitation is moderate to severe.   8. The aortic valve is tricuspid. There is moderate calcification of the  aortic valve. There is moderate thickening of the aortic valve. Aortic  valve regurgitation is moderate. Moderate aortic valve stenosis. Aortic  regurgitation PHT measures 319 msec.  Aortic valve mean gradient measures 23.3 mmHg. Aortic valve Vmax measures  3.21 m/s.   9. Aortic dilatation noted. There is mild dilatation of the ascending  aorta, measuring 39 mm. There is mild dilatation of the aortic root,  measuring 42 mm.  10. Hepatic vein flow reversal is seen is seen intermittently (likely with  respiration, spirometry was not performed). The inferior vena cava is  dilated in size with >50% respiratory variability, suggesting right atrial  pressure of 8 mmHg.   Comparison(s): The left ventricular function is worsened. There is marked  increase in mitral valve prosthesis gradients, which may be partly related  to the increase in heart rate, but could also be due to prosthetic valve  endocarditis. Pulmonary artery  hypertension is worse, likley due to increase left heart pressures. Aortic  stenosis is only slightly worse.  _____________  Right/ Left Cardiac Catheterization 02/14/2023:   Mid LM to Ost LAD lesion is 40% stenosed.   Ramus lesion is 100% stenosed.   Ost LAD to Prox LAD lesion is 80%  stenosed.  Previously placed Mid LAD lesion is 100% stenosed.   Previously placed Ost Cx to Prox Cx stent of unknown type is  widely patent.   Prox Cx to Mid Cx lesion is 65% stenosed.   1st Mrg lesion is 50% stenosed.   Previously placed Prox RCA lesion is 40% stenosed -focal ISR.   Mid  RCA lesion is 85% stenosed.   Mid RCA to Dist RCA lesion is 20% stenosed.  Dist RCA lesion is 20% stenosed.   SVG-RI graft was visualized by angiography and is large.  The graft exhibits no disease.   LIMA-LAD graft was visualized by angiography and is large.  The graft exhibits no disease. -----------------------------------------------------------   Hemodynamic findings consistent with severe pulmonary hypertension.   There is moderate aortic valve stenosis.   There is severe mitral valve stenosis.   Severe multivessel CAD: -> Distal LM 40%, ostial LAD 80% followed by short normal segment with SP and small to Diag branch then 100% occluded stent 100% ostial RI occlusion\ Widely patent ostial-proximal LCx stent with ~65% stenosis just distal to the stent and prior to OM/AVG bifurcation with trivial disease in the OM branches 40% ostial RCA with heavily calcified RCA (difficult to assess stent versus calcification. The stent appears to be in the proximal to mid segment followed by a 85% stenosis in the mid RCA. -> Widely patent large LIMA to mid LAD with retrograde filling of the 2nd Diag branch; widely patent SVG-RI Right Heart Cath/Hemodynamics: RA P 13 mmHg; RVP-EDP 65/3-11 mmHg; PAP-mean 60/35-45 mmHg, PCWP 40 to 44 mmHg (V waves appeared more prominent), LV P-EDP 150/6-14 mmHg, AO P-MAP 114/64-82 mmHg Wedge-LVEDP gradient ~30 mmHg; AoV mean gradient 35 mmHg Ao sat 88%, PA sat 56%; (FICK) Cardiac Output-Index 6.76-2.94   Recommendations: Optimization of medical and fluid management Will likely need staged imaging-guided PCI of the RCA with potential FFR guided PCI to the LCx as well => discussion with Dr. Lennie Odor, this will need to be done after having his dialysis fistula cleared and dialysis catheter removed (this would prevent having to hold Plavix).  Diagnostic Dominance: Right  _______________  TEE 02/15/2023: Impressions: 1. Left ventricular ejection fraction, by  estimation, is 30%. The left  ventricle has moderately decreased function. The left ventricle  demonstrates global hypokinesis and abnormal septal motion secondary to  conduction delay.   2. Right ventricular systolic function is moderately reduced. The right  ventricular size is normal.   3. Left atrial size was severely dilated. No left atrial/left atrial  appendage thrombus was detected. The LAA emptying velocity was 25 cm/s.   4. Right atrial size was moderately dilated.   5. The mitral valve has been repaired/replaced. Mild mitral valve  regurgitation. Severe prosthetic mitral valve stenosis. Prosthetic valve  leaflets are calcified and thickened with restricted opening. No definite  valvular vegetations, no dehiscence of  the prosthesis. The mean mitral valve gradient is 13.0 mmHg at HR 83 bpm.  There is a 31 mm Edwards-Sapien bioprosthetic valve present in the mitral  position. Procedure Date: 09/05/2015. Echo findings are consistent with  stenosis the mitral prosthesis.   6. There is a calcified small lesion on the atrial aspect of the  tricuspid valve, likely anterior leaflet best seen at 45 deg (clip 86 and  90). This could represent degenerative calcium vs healed calcified  vegetation. Patient has had a previous RV lead.   7. The aortic valve is calcified. There is severe calcifcation of the  aortic valve. There is moderate thickening of the aortic valve. Aortic  valve regurgitation is moderate, vena contracta 0.5 cm. Moderate aortic  valve stenosis. Aortic valve area, by  VTI measures 1.32 cm. Aortic valve mean gradient measures 24.0 mmHg.  Aortic valve Vmax measures 3.11 m/s.   8. Large pleural effusion.   9. Aortic dilatation noted. There is mild dilatation of the ascending  aorta, measuring 42 mm. There is Moderate (Grade III) plaque.  _______________  Dialysis Shuntogram 02/16/2023: Findings: Patent brachiocephalic fistula above the elbow. The outflow vein occludes at  the mid humeral shaft level, with no significant competing outflow veins.   The outflow vein occlusion could not be traversed to reestablish good antegrade flow through the fistula.   Impression: 1. Patent brachiocephalic fistula, with occlusion of the outflow vein at the level of the mid humeral shaft which could not be traversed.   Access: Not approachable for percutaneous intervention. Consider surgical consultation.  History of Present Illness     Dominic [name] is a 72 y.o. 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  Patient reported he was at a family reunion on Sunday 02/11/2023 when he stared feeling "shaky" and then lost consciousness and hit hit head. He was unsure how long he was unconscious for but noted feeling short of breath, lightheaded, and nauseous with associated emesis when he woke up. He also reported low level 3/10 non-radiating chest aching. EMS was called and upon their arrival heart rate was in the 30s and systolic BP was 41. He was given Epinephrine and was externally paced. Of note, the patient did report he had another syncopal episode about 1 week prior to admission after completing dialysis. He estimated he was unconscious for about 1 minute at that time and did not seek medical attention. He also reported a non-productive cough, rhinorrhea and chronic lower extremity swelling. He denied fevers, chills, abdominal pain, PND, orthopnea, palpitations.   He initially presented to the [facility] ED. Upon his  initial arrival, his vital signs ad normalized - BP was 135/69 and heart rate was in the 90s without pacing. EKG showed normal sinus rhythm with known LBBB. High-sensitivity troponin elevated at 1,359 >> 1,579 >> 2,105. BNP elevated at 1,150. His potassium was 5 and he was treated for hyperkalemia. Otherwise, Initial labs were notable for WBC 7.6, Hgb 10.6, Na 133, Cr 3.41, Glucose 188. It was unclear if any further interventions were performed at outside hospital. Given that he had this syncopal event and the outside hospital did not have EP, he was transferred to [facility] for a higher level of care.   On arrival to [facility], vital signs were all within normal limits. EKG showed normal sinus rhythm with 1st degree AVB and LBBB. Chest x-ray showed bilateral pulmonary edema. His only complaint upon arrival to [facility] was shortness of breath but he denied any other symptoms including chest pain.  Of note, he has a long smoking history and currently smokes 10-12 cigarettes per day. He denied any alcohol or drug use.     Hospital Course  Consultants: EP, Nephrology, IR, Palliative Care   Syncope  Symptomatic Bradycardia Patient was admitted with syncope and symptomatic bradycardia with heart rates as low as the 30s in the field requiring treatment with Atropine, Epinephrine >> Norepinephrine (briefly), and transcutaneous pacing with quick recovery.  Heart rates normalized by the time he arrived to the ED.  EP was consulted.  He previously had CRT-D but this was extracted in 11/2021 in the setting of staph bacteremia that involved his prosthetic valve and ICD leads.  There was initial consideration for possible placement of a leadless pacemaker given given right upper extremity fistula prohibiting right sided device implant and indwelling HD line making reimplant on the previously infected left chest even higher risk.  However, his heart rate stabilized and he had no recurrent bradycardia when his  home metoprolol was stopped.  He was ultimately not felt to be a good candidate for a temporary pacemaker, CRT-D reimplantation, or Micra device.  Given high risk of infection and improvement in heart rate off metoprolol, EP recommended following this conservatively.  Will avoid all AV nodal agents.  Of note, Palliative Care was consulted and patient indicated to them that he is not very interested in any aggressive interventions or procedure including valve replacement/ repair or pacemaker implantation.   Acute on Chronic Combined LBBB with Significant LV Dyssynchrony  Patient has a history of chronic combined CHF with EF as low as 30% in the past.  EF subsequently normalized after treatment with CRT-D. 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  Since then, he has had a drop in his EF.  Echo this admission showed LVEF of 35-40% with global hypokinesis and severe septal-lateral dyssynchrony due to LBBB as well as moderately enlarged RV with moderately reduced systolic function and moderately elevated PASP of 57.3 mmHg.  LVEDP gradient was around 30 mmHg in PCWP was 40-44 mmHg on RHC. Volume status was managed with dialysis. GDMT was limited by his ESRD and soft BP.  Will decrease home Hydralazine to 10mg  twice daily at discharge (given TID dosing was rarely given in the hospital due to soft BP). Home Imdur was also decreased to 60mg  daily. He can hold both of these on morning of dialysis if needed. No beta-blocker due to bradycardia as above. Will also prescribe Midodrine 10mg  for patient to take on dialysis days at discharge. Of note, the drop in his EF was felt to be more likely due to dyssynchrony from his LBBB than  fromhis CAD (although could be a mixed picture)  CAD NSTEMI Patient has a history of CAD with CABG in 2015 and multiple PCIs.  High-sensitivity opponent was elevated this admission and peaked at 2, 105.  Echo showed a drop in his EF to 35-40% with global  hypokinesis.  LHC showed severe multivessel disease with pain in LIMA to LAD and SVG to RI but 85% stenosis of mid RCA and 65% stenosis of LCx. Medical therapy was recommended with likely need for a staged imaging guided PCI of the RCA and potential FFR guided PCI to the LCx after dialysis fistula cleared and catheter removed (given Plavix would need to be held for this). He was started on Plavix 75mg  daily on day of discharge and high sensitivity statin was continued. No aspirin given need for Eliquis.  Moderate Aortic Stenosis/ Insufficiency S/p Bioprosthetic MVR with Severe Mitral Stenosis TTE this admission showed s/p bioprosthetic MVR with severe mitral stenosis (gradient 18 mL per mercury), moderate to  severe tricuspid regurgitation, moderate aortic stenosis/insufficiency (mean gradient 23.3 mmHg).  TEE confirms severe stenosis of bioprosthetic mitral valve and a moderate aortic stenosis and regurgitation.  He is not currently a candidate for structural heart procedures due to his indwelling HD catheter. Palliative Care was consulted to help with goals of care discussion and patient is currently not inclined to have surgery or any other aggressive care because he feels like he would not survive anyway.  Paroxysmal Atrial Fibrillation Maintaining sinus rhythm. Conitnue Amiodarone 200mg  daily. Metoprolol stopped due to bradycardia as above. He was placed on IV Heparin during admission due to need for procedure but was transitioned back to Eliquis 5mg  twice daily at discharge.   ESRD  Occluded Right AV Fistula Indwelling HD Catheter Patient is on dialysis Tuesday/ Thursday/ Saturdays and Nephrology assisted [place] with this throughout admission. He was started on Midodrine 10mg  on dialysis days during admission due to low BP and this will be continued at discharge. He has a known occluded right AV fistula and an indwelling HD catheter which is problematic for management of his valvular disease. IR was  consulted and performed a dialysis shuntogram on 02/16/2023 which showed an occluded outflow vein at the mid humeral shaft level which could not be traversed and was felt to be not approachable for percutaneous intervention. Will need to follow-up with primary Nephrologist - AV fistula will need to be addressed at outpatient hemodialysis clinic.   Hypertension Patient has a history of hypertension but BP has been soft this admission. Home Hydralazine and Imdur were decreased as above. Home Metoprolol was stopped due to bradycardia. He was also started on Midodrine 10mg  on dialysis days (T/ Th/ Sat).  Hyperlipidemia Lipid panel this admission: Total Cholesterol 128, Triglycerides 106, HDL 46, LDL 61. Continue Lipitor 40mg  daily.  Type 2 Diabetes Mellitus Hemoglobin A1c 6.0 this admission. Will resume home insulin at discharge.  Anemia of Chronic Disease Hemoglobin stable.   Severe COPD No acute issues this admission. Continue home inhalers. He also has home O2 that he uses continuously.   Hypokalemia Potassium 3.3 on day of discharge. This was supplemented. Labs monitored frequently with dialysis.   Patient seen and examined by Dr. Antoine Poche today and felt to be okay for discharge. Outpatient follow-up arranged. Medications as below.   Did the patient have an acute coronary syndrome (MI, NSTEMI, STEMI, etc) this admission?:  Yes                               AHA/ACC Clinical Performance & Quality Measures: Aspirin prescribed? - No - due to need for Eliquis ADP Receptor Inhibitor (Plavix/Clopidogrel, Brilinta/Ticagrelor or Effient/Prasugrel) prescribed (includes medically managed patients)? - Yes Beta Blocker prescribed? - No - symptomatic bradycardia High Intensity Statin (Lipitor 40-80mg  or Crestor 20-40mg ) prescribed? - Yes EF assessed during THIS hospitalization? - Yes For EF <40%, was ACEI/ARB prescribed? - No - Reason:  ESRD For EF <40%, Aldosterone Antagonist (Spironolactone or  Eplerenone) prescribed? - No - Reason:  ESRD Cardiac Rehab Phase II ordered (including medically managed patients)? - Yes    The patient will be scheduled for a TOC follow up appointment within 7 days.  A message has been sent to the [facility] and Scheduling Pool at the office where the patient should be seen for follow up.  _____________  Discharge Vitals Blood pressure 111/67, pulse 79, temperature 98.7 F (37.1 C), resp. rate (!) 21, height 5\' 10"  (  1.778 m), weight 110.5 kg, SpO2 94%.  Filed Weights   02/17/23 0400 02/17/23 1333 02/17/23 1734  Weight: 111.8 kg 112.3 kg 110.5 kg    Labs & Radiologic Studies    CBC Recent Labs    02/16/23 0221 02/17/23 0231  WBC 5.8 5.7  HGB 11.0* 10.8*  HCT 33.6* 33.2*  MCV 87.3 87.1  PLT 196 197   Basic Metabolic Panel Recent Labs    62/95/28 0629 02/16/23 0221 02/17/23 0231  NA 133* 135 131*  K 3.9 3.6 3.3*  CL 100 96* 96*  CO2 19* 24 21*  GLUCOSE 122* 139* 122*  BUN 43* 27* 37*  CREATININE 2.80* 2.48* 3.19*  CALCIUM 8.4* 8.6* 8.4*  PHOS 3.7  --   --    Liver Function Tests Recent Labs    02/15/23 0629  ALBUMIN 2.3*   No results for input(s): "LIPASE", "AMYLASE" in the last 72 hours. High Sensitivity Troponin:   Recent Labs  Lab 02/12/23 0003 02/13/23 0002  TROPONINIHS 1,099* 1,089*    BNP Invalid input(s): "POCBNP" D-Dimer No results for input(s): "DDIMER" in the last 72 hours. Hemoglobin A1C No results for input(s): "HGBA1C" in the last 72 hours. Fasting Lipid Panel No results for input(s): "CHOL", "HDL", "LDLCALC", "TRIG", "CHOLHDL", "LDLDIRECT" in the last 72 hours. Thyroid Function Tests No results for input(s): "TSH", "T4TOTAL", "T3FREE", "THYROIDAB" in the last 72 hours.  Invalid input(s): "FREET3" _____________  IR DIALY SHUNT INTRO NEEDLE/INTRACATH INITIAL W/IMG RIGHT  Result Date: 02/16/2023 CLINICAL DATA:  End stage renal disease, difficult access of right upper arm hemodialysis fistula EXAM:  DIALYSIS SHUNTOGRAM FLUOROSCOPY: Radiation Exposure Index (as provided by the fluoroscopic device): 3 mGy air Kerma COMPARISON:  None Available. TECHNIQUE: An 18-gauge angiocatheter was placed antegrade into the outflow vein of the patient's AV hemodialysis fistula for dialysis fistulography. The angiocatheter and surrounding skin were then prepped with Betadine, draped in usual sterile fashion, infiltrated locally with 1% lidocaine the angiocatheter was exchanged over a Benson wire for a 6 [place] vascular sheath, through which a 5 French angled angiographic catheter was advanced in attempts to traverse central vein occlusion, which were ultimately unsuccessful. Once guidewire perforation occurred, attempts were terminated. After follow-up venography, the catheter, sheath, and guidewire were removed and hemostasis achieved with a 2-0 Ethilon purse-string suture. No immediate complication. FINDINGS: Patent brachiocephalic fistula above the elbow. The outflow vein occludes at the mid humeral shaft level, with no significant competing outflow veins. The outflow vein occlusion could not be traversed to reestablish good antegrade flow through the fistula. IMPRESSION: 1. Patent brachiocephalic fistula, with occlusion of the outflow vein at the level of the mid humeral shaft which could not be traversed. ACCESS: Not approachable for percutaneous intervention. Consider surgical consultation. Electronically Signed   By: Corlis Leak M.D.   On: 02/16/2023 18:01   ECHO TEE  Result Date: 02/15/2023    TRANSESOPHOGEAL ECHO REPORT   Patient Name:   Dominic [name] Date of Exam: 02/15/2023 Medical Rec #:  [id number]      Height:       70.0 in Accession #:    [id number]     Weight:       250.7 lb Date of Birth:  September 12, 1950      BSA:          2.298 m Patient Age:    72 years       BP:           124/75 mmHg Patient Gender: M  HR:           87 bpm. Exam Location:  Inpatient Procedure: Transesophageal Echo, Color Doppler,  Cardiac Doppler and 3D Echo Indications:    Prosthetic mitral valve stenosis  History:        Patient has prior history of Echocardiogram examinations, most                 recent 02/13/2023. CHF, CAD, Defibrillator, Mitral Valve Disease,                 Mitral Stenosis and Aortic Valve Disease, Arrythmias:Atrial                 Fibrillation and LBBB; Risk Factors:Diabetes and Hypertension.                 CKD.                  Mitral Valve: 31 mm Edwards-Sapien bioprosthetic valve valve is                 present in the mitral position. Procedure Date: 09/05/2015.  Sonographer:    Milda Smart Referring Phys: [id number] DAYNA N DUNN PROCEDURE: After discussion of the risks and benefits of a TEE, an informed consent was obtained from the patient. TEE procedure time was 42 minutes. The transesophogeal probe was passed without difficulty through the esophogus of the patient. Imaged were obtained with the patient in a left lateral decubitus position. Local oropharyngeal anesthetic was provided with Cetacaine. Sedation performed by different physician. The patient was monitored while under deep sedation. Anesthestetic sedation was provided intravenously by Anesthesiology: 436.61mg  of Propofol. Image quality was excellent. The patient's vital signs; including heart rate, blood pressure, and oxygen saturation; remained stable throughout the procedure. The patient developed no complications during the procedure.  IMPRESSIONS  1. Left ventricular ejection fraction, by estimation, is 30%. The left ventricle has moderately decreased function. The left ventricle demonstrates global hypokinesis and abnormal septal motion secondary to conduction delay.  2. Right ventricular systolic function is moderately reduced. The right ventricular size is normal.  3. Left atrial size was severely dilated. No left atrial/left atrial appendage thrombus was detected. The LAA emptying velocity was 25 cm/s.  4. Right atrial size was moderately  dilated.  5. The mitral valve has been repaired/replaced. Mild mitral valve regurgitation. Severe prosthetic mitral valve stenosis. Prosthetic valve leaflets are calcified and thickened with restricted opening. No definite valvular vegetations, no dehiscence of the prosthesis. The mean mitral valve gradient is 13.0 mmHg at HR 83 bpm. 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 Procedure Date: 09/05/2015. Echo findings are consistent with stenosis the mitral prosthesis.  6. There is a calcified small lesion on the atrial aspect of the tricuspid valve, likely anterior leaflet best seen at 45 deg (clip 86 and 90). This could represent degenerative calcium vs healed calcified vegetation. Patient has had a previous RV lead.  7. The aortic valve is calcified. There is severe calcifcation of the aortic valve. There is moderate thickening of the aortic valve. Aortic valve regurgitation is moderate, vena contracta 0.5 cm. Moderate aortic valve stenosis. Aortic valve area, by VTI measures 1.32 cm. Aortic valve mean gradient measures 24.0 mmHg. Aortic valve Vmax measures 3.11 m/s.  8. Large pleural effusion.  9. Aortic dilatation noted. There is mild dilatation of the ascending aorta, measuring 42 mm. There is Moderate (Grade III) plaque. FINDINGS  Left Ventricle: Left ventricular ejection  fraction, by estimation, is 30%. The left ventricle has moderately decreased function. The left ventricle demonstrates global hypokinesis. The left ventricular internal cavity size was normal in size. Concentric  left ventricular hypertrophy. Abnormal (paradoxical) septal motion, consistent with left bundle branch block. Right Ventricle: The right ventricular size is normal. Right vetricular wall thickness was not well visualized. Right ventricular systolic function is moderately reduced. Left Atrium: Left atrial size was severely dilated. No left atrial/left atrial appendage thrombus was detected. The  LAA emptying velocity was 25 cm/s. Right Atrium: Right atrial size was moderately dilated. Pericardium: Trivial pericardial effusion is present. Mitral Valve: The mitral valve has been repaired/replaced. Mild mitral valve regurgitation. 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 Procedure Date: 09/05/2015. 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 Severe mitral valve stenosis. MV peak gradient, 16.8 mmHg. The mean mitral valve gradient is 13.0 mmHg with average heart rate of 87 bpm. Tricuspid Valve: There is a calcified small lesion on the atrial aspect of the tricuspid valve, likely anterior leaflet best seen at 45 deg (clip 86 and 90). This could represent degenerative calcium vs healed calcified vegetation. Patient has had a previous RV lead. The tricuspid valve is grossly normal. Tricuspid valve regurgitation is mild. Aortic Valve: AV vena contracta 0.5 cm. The aortic valve is calcified. There is severe calcifcation of the aortic valve. There is moderate thickening of the aortic valve. Aortic valve regurgitation is moderate. Aortic regurgitation PHT measures 297 msec.  Moderate aortic stenosis is present. Aortic valve mean gradient measures 24.0 mmHg. Aortic valve peak gradient measures 38.7 mmHg. Aortic valve area, by VTI measures 1.32 cm. There is no evidence of aortic valve vegetation. Pulmonic Valve: The pulmonic valve was not well visualized. Pulmonic valve regurgitation is mild. Aorta: The aortic root is normal in size and structure and aortic dilatation noted. There is mild dilatation of the ascending aorta, measuring 42 mm. There is moderate (Grade III) plaque. IAS/Shunts: No atrial level shunt detected by color flow Doppler. Additional Comments: There is a large pleural effusion. Spectral Doppler performed. LEFT VENTRICLE PLAX 2D LVOT diam:     2.20 cm LV SV:         82 LV SV Index:   36 LVOT Area:     3.80 cm  AORTIC VALVE AV Area  (Vmax):    1.28 cm AV Area (Vmean):   1.20 cm AV Area (VTI):     1.32 cm AV Vmax:           311.00 cm/s AV Vmean:          235.000 cm/s AV VTI:            0.627 m AV Peak Grad:      38.7 mmHg AV Mean Grad:      24.0 mmHg LVOT Vmax:         105.00 cm/s LVOT Vmean:        73.900 cm/s LVOT VTI:          0.217 m LVOT/AV VTI ratio: 0.35 AI PHT:            297 msec  AORTA Ao Root diam: 3.70 cm Ao Asc diam:  4.20 cm MITRAL VALVE MV Peak grad: 16.8 mmHg  SHUNTS MV Mean grad: 13.0 mmHg  Systemic VTI:  0.22 m MV Vmax:      2.05 m/s   Systemic Diam: 2.20 cm MV Vmean:     174.0 cm/s  Weston Brass MD Electronically signed by Weston Brass MD Signature Date/Time: 02/15/2023/1:45:56 PM    Final    EP STUDY  Result Date: 02/15/2023 See surgical note for result.  CARDIAC CATHETERIZATION  Result Date: 02/15/2023   Mid LM to Ost LAD lesion is 40% stenosed.   Ramus lesion is 100% stenosed.   Ost LAD to Prox LAD lesion is 80% stenosed.  Previously placed Mid LAD lesion is 100% stenosed.   Previously placed Ost Cx to Prox Cx stent of unknown type is  widely patent.   Prox Cx to Mid Cx lesion is 65% stenosed.   1st Mrg lesion is 50% stenosed.   Previously placed Prox RCA lesion is 40% stenosed -focal ISR.   Mid RCA lesion is 85% stenosed.   Mid RCA to Dist RCA lesion is 20% stenosed.  Dist RCA lesion is 20% stenosed.   SVG-RI graft was visualized by angiography and is large.  The graft exhibits no disease.   LIMA-LAD graft was visualized by angiography and is large.  The graft exhibits no disease.   ------------------------------------------------------------   Hemodynamic findings consistent with severe pulmonary hypertension.   There is moderate aortic valve stenosis.   There is severe mitral valve stenosis. Severe multivessel CAD: -> Distal LM 40%, ostial LAD 80% followed by short normal segment with SP and small to Diag branch then 100% occluded stent 100% ostial RI occlusion\ Widely patent ostial-proximal LCx stent with  ~65% stenosis just distal to the stent and prior to OM/AVG bifurcation with trivial disease in the OM branches 40% ostial RCA with heavily calcified RCA (difficult to assess stent versus calcification. The stent appears to be in the proximal to mid segment followed by a 85% stenosis in the mid RCA. -> Widely patent large LIMA to mid LAD with retrograde filling of the 2nd Diag branch; widely patent SVG-RI Right Heart Cath/Hemodynamics: RA P 13 mmHg; RVP-EDP 65/3-11 mmHg; PAP-mean 60/35-45 mmHg, PCWP 40 to 44 mmHg (V waves appeared more prominent), LV P-EDP 150/6-14 mmHg, AO P-MAP 114/64-82 mmHg Wedge-LVEDP gradient ~30 mmHg; AoV mean gradient 35 mmHg Ao sat 88%, PA sat 56%; (FICK) Cardiac Output-Index 6.76-2.94 RECOMMENDATIONS Optimization of medical and fluid management Will likely need staged imaging-guided PCI of the RCA with potential FFR guided PCI to the LCx as well => discussion with Dr. Lennie Odor, this will need to be done after having his dialysis fistula cleared and dialysis catheter removed (this would prevent having to hold Plavix). Bryan Lemma, MD  ECHOCARDIOGRAM COMPLETE  Result Date: 02/13/2023    ECHOCARDIOGRAM REPORT   Patient Name:   Dominic [name] Date of Exam: 02/13/2023 Medical Rec #:  [id number]      Height:       70.0 in Accession #:    [id number]     Weight:       258.8 lb Date of Birth:  1951/04/14      BSA:          2.329 m Patient Age:    72 years       BP:           124/76 mmHg Patient Gender: M              HR:           94 bpm. Exam Location:  Inpatient Procedure: 2D Echo, Color Doppler, Cardiac Doppler and Intracardiac            Opacification Agent Indications:    I44.7 LBBB  History:        Patient has prior history of Echocardiogram examinations, most                 recent 01/23/2022. CHF, Abnormal ECG, Defibrillator and Prior                 CABG, COPD, Endocarditis, Mitral Stenosis, Mitral Valve Disease                 and Aortic Valve Disease, Arrythmias:LBBB,                  Signs/Symptoms:Syncope; Risk Factors:Current Smoker and                 Hypertension. ESRD. MVR.                  Mitral Valve: 31 mm Edwards Sapien bioprosthetic valve valve is                 present in the mitral position. Procedure Date: 09/05/2015.  Sonographer:    Sheralyn Boatman RDCS Referring Phys: [id number] [facility]  Sonographer Comments: Technically difficult study due to poor echo windows. IMPRESSIONS  1. There is no left ventricular thrombus (Definity contrast was used). There is severe septal-lateral dyssynchrony due to LBBB. Left ventricular ejection fraction, by estimation, is 35 to 40%. The left ventricle has moderately decreased function. The left ventricle demonstrates global hypokinesis. The left ventricular internal cavity size was mildly dilated. There is mild concentric left ventricular hypertrophy. Indeterminate diastolic filling due to E-A fusion.  2. Right ventricular systolic function is moderately reduced. The right ventricular size is moderately enlarged. There is moderately elevated pulmonary artery systolic pressure. The estimated right ventricular systolic pressure is 57.3 mmHg.  3. Left atrial size was severely dilated.  4. Right atrial size was moderately dilated.  5. The mitral valve has been repaired/replaced. No evidence of mitral valve regurgitation. Severe mitral stenosis. The mean mitral valve gradient is 18.0 mmHg with average heart rate of 95 bpm. There is a 31 mm Edwards Sapien bioprosthetic valve present  in the mitral position. Procedure Date: 09/05/2015. Echo findings are consistent with stenosis the mitral prosthesis.  6. Large pleural effusion.  7. Tricuspid valve regurgitation is moderate to severe.  8. The aortic valve is tricuspid. There is moderate calcification of the aortic valve. There is moderate thickening of the aortic valve. Aortic valve regurgitation is moderate. Moderate aortic valve stenosis. Aortic regurgitation PHT measures 319 msec. Aortic valve mean  gradient measures 23.3 mmHg. Aortic valve Vmax measures 3.21 m/s.  9. Aortic dilatation noted. There is mild dilatation of the ascending aorta, measuring 39 mm. There is mild dilatation of the aortic root, measuring 42 mm. 10. Hepatic vein flow reversal is seen is seen intermittently (likely with respiration, spirometry was not performed). The inferior vena cava is dilated in size with >50% respiratory variability, suggesting right atrial pressure of 8 mmHg. Comparison(s): The left ventricular function is worsened. There is marked increase in mitral valve prosthesis gradients, which may be partly related to the increase in heart rate, but could also be due to prosthetic valve endocarditis. Pulmonary artery hypertension is worse, likley due to increase left heart pressures. Aortic stenosis is only slightly worse. 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 Consider TEE for evaluation for endocarditis of the mitral valve prosthesis. Consider a component of constrictive physiology (evaluate mitral flow and hepatic vein flow with spirometry). FINDINGS  Left Ventricle: There  is no left ventricular thrombus (Definity contrast was used). There is severe septal-lateral dyssynchrony due to LBBB. Left ventricular ejection fraction, by estimation, is 35 to 40%. The left ventricle has moderately decreased function. The left ventricle demonstrates global hypokinesis. Definity contrast agent was given IV to delineate the left ventricular endocardial borders. The left ventricular internal cavity size was mildly dilated. There is mild concentric left ventricular hypertrophy. Abnormal (paradoxical) septal motion, consistent with left bundle branch block. Indeterminate diastolic filling due to E-A fusion. Right Ventricle: The right ventricular size is moderately enlarged. Right vetricular wall thickness was not well visualized. Right ventricular systolic function is moderately  reduced. There is moderately elevated pulmonary artery systolic pressure. The tricuspid regurgitant velocity is 3.51 m/s, and with an assumed right atrial pressure of 8 mmHg, the estimated right ventricular systolic pressure is 57.3 mmHg. Left Atrium: Left atrial size was severely dilated. Right Atrium: Right atrial size was moderately dilated. Pericardium: There is no evidence of pericardial effusion. Mitral Valve: The mitral valve has been repaired/replaced. No evidence of mitral valve regurgitation. There is a 31 mm Edwards Sapien bioprosthetic valve present in the mitral position. Procedure Date: 09/05/2015. 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 Severe mitral valve stenosis. MV peak gradient, 25.6 mmHg. The mean mitral valve gradient is 18.0 mmHg with average heart rate of 95 bpm. Tricuspid Valve: The tricuspid valve is grossly normal. Tricuspid valve regurgitation is moderate to severe. No evidence of tricuspid stenosis. Aortic Valve: The aortic valve is tricuspid. There is moderate calcification of the aortic valve. There is moderate thickening of the aortic valve. Aortic valve regurgitation is moderate. Aortic regurgitation PHT measures 319 msec. Moderate aortic stenosis is present. Aortic valve mean gradient measures 23.3 mmHg. Aortic valve peak gradient measures 41.1 mmHg. Aortic valve area, by VTI measures 1.26 cm. Pulmonic Valve: The pulmonic valve was grossly normal. Pulmonic valve regurgitation is not visualized. No evidence of pulmonic stenosis. Aorta: Aortic dilatation noted. There is mild dilatation of the ascending aorta, measuring 39 mm. There is mild dilatation of the aortic root, measuring 42 mm. Venous: Hepatic vein flow reversal is seen is seen intermittently (likely with respiration, spirometry was not performed). The inferior vena cava is dilated in size with greater than 50% respiratory variability, suggesting right atrial pressure of 8 mmHg. The inferior vena  cava and the hepatic vein show a pattern of systolic flow reversal, suggestive of tricuspid regurgitation. IAS/Shunts: No atrial level shunt detected by color flow Doppler. Additional Comments: There is a large pleural effusion.  LEFT VENTRICLE PLAX 2D LVIDd:         6.00 cm      Diastology LVIDs:         4.80 cm      LV e' medial:  3.48 cm/s LV PW:         1.30 cm      LV e' lateral: 7.40 cm/s LV IVS:        1.40 cm LVOT diam:     2.20 cm LV SV:         71 LV SV Index:   30 LVOT Area:     3.80 cm  LV Volumes (MOD) LV vol d, MOD A2C: 142.5 ml LV vol d, MOD A4C: 162.0 ml LV vol s, MOD A2C: 87.3 ml LV vol s, MOD A4C: 101.4 ml LV SV MOD A2C:     55.2 ml LV SV MOD A4C:     162.0 ml LV SV  MOD BP:      57.0 ml RIGHT VENTRICLE            IVC RV S prime:     6.31 cm/s  IVC diam: 2.70 cm TAPSE (M-mode): 1.0 cm LEFT ATRIUM             Index        RIGHT ATRIUM           Index LA diam:        5.20 cm 2.23 cm/m   RA Area:     16.40 cm LA Vol (A2C):   53.9 ml 23.14 ml/m  RA Volume:   43.90 ml  18.85 ml/m LA Vol (A4C):   70.0 ml 30.06 ml/m LA Biplane Vol: 65.2 ml 28.00 ml/m  AORTIC VALVE AV Area (Vmax):    1.27 cm AV Area (Vmean):   1.23 cm AV Area (VTI):     1.26 cm AV Vmax:           320.67 cm/s AV Vmean:          222.000 cm/s AV VTI:            0.559 m AV Peak Grad:      41.1 mmHg AV Mean Grad:      23.3 mmHg LVOT Vmax:         107.00 cm/s LVOT Vmean:        71.800 cm/s LVOT VTI:          0.186 m LVOT/AV VTI ratio: 0.33 AI PHT:            319 msec  AORTA Ao Root diam: 4.20 cm Ao Asc diam:  4.05 cm MITRAL VALVE             TRICUSPID VALVE MV Peak grad: 25.6 mmHg  TR Peak grad:   49.3 mmHg MV Mean grad: 18.0 mmHg  TR Vmax:        351.00 cm/s MV Vmax:      2.53 m/s MV Vmean:     204.5 cm/s SHUNTS                          Systemic VTI:  0.19 m                          Systemic Diam: 2.20 cm Rachelle Hora Croitoru MD Electronically signed by Thurmon Fair MD Signature Date/Time: 02/13/2023/2:17:28 PM    Final    DG CHEST PORT 1  VIEW  Result Date: 02/12/2023 CLINICAL DATA:  CHF. EXAM: PORTABLE CHEST 1 VIEW COMPARISON:  12/27/2022 FINDINGS: Right internal jugular catheter tip overlies the atrial caval junction. Heart size upper normal, prosthetic cardiac valve. Post median sternotomy. Worsening pulmonary edema. Suspected bilateral pleural effusions, left greater than right. No pneumothorax. IMPRESSION: Worsening pulmonary edema. Suspected bilateral pleural effusions, left greater than right. Electronically Signed   By: Narda Rutherford M.D.   On: 02/12/2023 23:55   Disposition   Patient is being discharged home today in good condition.  Follow-up Plans & Appointments     Follow-up Information     Jodelle Gross, NP Follow up.   Specialties: Cardiology, Radiology, Cardiology Why: Hospital follow-up with Cardiology scheduled for 02/23/2023 at 2:45pm. Please arrive 15 minutes early for check-in. If this date/ time does not work for you, please call our office to reschedule. Contact information: [address] [place] [address] [phone]  Discharge Instructions     AMB referral to Phase II Cardiac Rehabilitation   Complete by: As directed    Diagnosis: NSTEMI   After initial evaluation and assessments completed: Virtual Based Care may be provided alone or in conjunction with Phase 2 Cardiac Rehab based on patient barriers.: Yes   Intensive Cardiac Rehabilitation (ICR) [facility] location only OR Traditional Cardiac Rehabilitation (TCR) *If criteria for ICR are not met will enroll in TCR [facility] only): Yes   Diet - low sodium heart healthy   Complete by: As directed    Increase activity slowly   Complete by: As directed    No wound care   Complete by: As directed         Discharge Medications   Allergies as of 02/17/2023       Reactions   Hydrochlorothiazide Itching, Other (See Comments)   Hyponatremia   Amoxicillin-pot Clavulanate Nausea And Vomiting   Clindamycin  Nausea And Vomiting, Other (See Comments)   Chest pain   Lisinopril Other (See Comments), Cough   hyponatremia   Meloxicam Itching, Nausea Only   Topiramate Other (See Comments)   Tamsulosin Other (See Comments)   dysuria        Medication List     STOP taking these medications    glipiZIDE 2.5 MG 24 hr tablet Commonly known as: GLUCOTROL XL   HYDROcodone-acetaminophen 5-325 MG tablet Commonly known as: NORCO/VICODIN   metoprolol succinate 25 MG 24 hr tablet Commonly known as: Toprol XL   potassium chloride SA 20 MEQ tablet Commonly known as: KLOR-CON M   torsemide 20 MG tablet Commonly known as: DEMADEX       TAKE these medications    acetaminophen 500 MG tablet Commonly known as: TYLENOL Take 500 mg by mouth every 6 (six) hours as needed for moderate pain.   albuterol 108 (90 Base) MCG/ACT inhaler Commonly known as: VENTOLIN HFA Inhale 1-2 puffs into the lungs every 4 (four) hours as needed.   allopurinol 100 MG tablet Commonly known as: ZYLOPRIM Take 100 mg by mouth daily.   amiodarone 200 MG tablet Commonly known as: PACERONE Take 1 tablet (200 mg total) by mouth daily.   apixaban 5 MG Tabs tablet Commonly known as: ELIQUIS Take 5 mg by mouth in the morning and at bedtime.   atorvastatin 40 MG tablet Commonly known as: LIPITOR Take 40 mg by mouth daily.   clopidogrel 75 MG tablet Commonly known as: Plavix Take 1 tablet (75 mg total) by mouth daily.   DULoxetine 60 MG capsule Commonly known as: CYMBALTA Take 60 mg by mouth daily.   famotidine 20 MG tablet Commonly known as: PEPCID Take 10 mg by mouth daily.   ferrous sulfate 325 (65 FE) MG tablet Take 325 mg by mouth daily with breakfast.   fluticasone 50 MCG/ACT nasal spray Commonly known as: FLONASE Place 1 spray into both nostrils daily as needed for allergies.   fluticasone-salmeterol 45-21 MCG/ACT inhaler Commonly known as: ADVAIR HFA Inhale 2 puffs into the lungs 2 (two) times  daily.   gabapentin 100 MG capsule Commonly known as: NEURONTIN Take 100 mg by mouth 2 (two) times daily.   hydrALAZINE 10 MG tablet Commonly known as: APRESOLINE Take 1 tablet (10 mg total) by mouth 2 (two) times daily. You can hold morning dose on dialysis days. What changed:  when to take this additional instructions   hydrocortisone cream 1 % Apply 1 application  topically daily as needed for itching.  Incruse Ellipta 62.5 MCG/ACT Aepb Generic drug: umeclidinium bromide Inhale 1 puff into the lungs daily.   isosorbide mononitrate 30 MG 24 hr tablet Commonly known as: IMDUR Take 1 tablet (30 mg total) by mouth daily. You can hold on mornings of dialysis days. Start taking on: February 18, 2023 What changed:  medication strength how much to take additional instructions   Lantus SoloStar 100 UNIT/ML Solostar Pen Generic drug: insulin glargine Inject 30-40 Units into the skin 2 (two) times daily. Take 40 units in the morning and 30 units in the evening   midodrine 10 MG tablet Commonly known as: PROAMATINE Take 1 tablet (10 mg total) by mouth Every Tuesday,Thursday,and Saturday with dialysis.   nitroGLYCERIN 0.4 MG SL tablet Commonly known as: NITROSTAT Place 1 tablet (0.4 mg total) under the tongue every 5 (five) minutes as needed for chest pain.   polyethylene glycol powder 17 GM/SCOOP powder Commonly known as: GLYCOLAX/MIRALAX Take 1 Container by mouth daily as needed for moderate constipation.   sucralfate 1 g tablet Commonly known as: CARAFATE Take 1 tablet (1 g total) by mouth 2 (two) times daily. What changed: Another medication with the same name was removed. Continue taking this medication, and follow the directions you see here.   SUPER B COMPLEX PO Take 1 capsule by mouth daily.   tiZANidine 2 MG tablet Commonly known as: ZANAFLEX Take 2 mg by mouth every 8 (eight) hours as needed for muscle spasms.   Vitamin D 50 MCG (2000 UT) tablet Take 2,000  Units by mouth daily.           Outstanding Labs/Studies    Duration of Discharge Encounter   Greater than 30 minutes including physician time.  Signed, Corrin Parker, PA-C 02/17/2023, 6:00 PM

## 2023-02-17 NOTE — Consult Note (Signed)
Consultation Note Date: 02/17/2023   Patient Name: Dominic [name]  DOB: May 18, 1951  MRN: [id number]  Age / Sex: 72 y.o., male  PCP: Montez Hageman, DO Referring Physician: Rollene Rotunda, MD  Reason for Consultation: Establishing goals of care  HPI/Patient Profile: 72 y.o. 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   He was at a family reunion on Sunday 02/11/23 when he started feeling "shaky" then lost conscientiousness and hit his head. He is unsure how long he was out for. When he woke up he noted feeling SOB, LH, and nauseous with associated emesis. He was initially admitted to the [facility] ED. Given that he had this syncopal event and the OSH does not have EP, he was transferred to [facility] for a higher level of care.  He is currently admitted for symptomatic bradycardia, type II NSTEMI, acute on chronic CHF, as well as severe aortic stenosis and severe mitral stenosis. PMT has been consulted to assist with goals of care conversation.  Clinical Assessment and Goals of Care:  I have reviewed medical records including EPIC notes, labs and imaging, assessed the patient and then met at the bedside to discuss diagnosis prognosis, GOC, EOL wishes, disposition and options.  I introduced Palliative Medicine as specialized medical care for people living with serious illness. It focuses on providing relief from the symptoms and stress of a serious illness. The goal is to improve quality of life for both the patient and the family.  We discussed a brief life review of the patient and then focused on their current illness.   I  attempted to elicit values and goals of care important to the patient.    Medical History Review and Understanding:  Patient has a good understanding the severity of his illness.  We discussed the events leading up to his acute hospitalization as well as his chronic comorbidities and high risk for further decline/symptom burden.  Social History: Patient has been married to his wife for 42 years.  They do not have any children, though they have several nieces and nephews.  He has a little [place] at home in [place].  He previously enjoyed fishing and tinkering, but has not been able to do this lately.  He mostly watches TV and has conversations with his wife to help him cope.  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  He also close to his sister-in-law, however she has not been able to visit lately due to her own cancer and struggles with functional status.  Functional and Nutritional State: Patient was not using any assistive device prior to admission, however he has been using a walker in the hospital and plans to continue this at home.  He reports a poor appetite.  Palliative Symptoms: Anxiety, depression, fatigue  Advance Directives: No documentation currently on file.  Code Status: Concepts specific to code status, artifical feeding and hydration, and rehospitalization were considered and discussed.  Patient would likely prefer DNR and voices that the "shocks" he got via EMS were very painful.  However, he would like to discuss this with his wife in more detail.  Discussion: Patient  shares that he is not very interested in valve placement, pacemaker, or other aggressive interventions/procedures because he feels he would not survive them anyway.  He states that he has been told his heart is at 30% function and he understands his problems with his valves and the electrical conduction of his heart are also worrisome.   He  reflects that while his wife "wants him around," he does not really care how much longer he lives.  He acknowledges depression.  We discussed that depression is often common with patients receiving dialysis and patient notes that this has been worsening in the past 6 weeks or so since he has started on dialysis.  He is also very tired.  He is not sure what would help improve his quality of life.   Patient does hope to "get a little better" and is willing to try dialysis for another few months, but overall does not desire to live in his current condition for a prolonged time.  We discussed the benefit of outpatient palliative care support for ongoing discussions, as well as indications for transition to hospice. We talked about not "giving up" but rather changing focus when he feels this has reached an unacceptable level of suffering despite medical interventions.  Patient states that he possibly has outpatient palliative services already assisting, as well as home health.   The difference between aggressive medical intervention and comfort care was considered in light of the patient's goals of care. Hospice and Palliative Care services outpatient were explained and offered.   Discussed the importance of continued conversation with family and the medical providers regarding overall plan of care and treatment options, ensuring decisions are within the context of the patient's values and GOCs.   Questions and concerns were addressed.  Hard Choices booklet left for review. The family was encouraged to call with questions or concerns.  PMT will continue to support holistically.   SUMMARY OF RECOMMENDATIONS   -Continue full code for now, patient desires more conversations with his wife -Patient has preference to avoid aggressive interventions and procedures such as pacemaker, valve replacement, etc. due to belief that he would not survive such procedures anyway -Patient hopes to get a little better and is  willing to try HD for another few months, but he does not want to live in his current state for prolonged time -TOC consulted for assistance with outpatient palliative referral -Psychosocial and emotional support provided -PMT will continue to follow and support as needed  Prognosis:  Poor long-term prognosis  Discharge Planning: Home with Palliative Services      Primary Diagnoses: Present on Admission:  Symptomatic bradycardia  Hyperlipidemia  LBBB (left bundle branch block)   Physical Exam Vitals and nursing note reviewed.  Constitutional:      General: He is not in acute distress.    Appearance: He is ill-appearing.  Cardiovascular:     Rate and Rhythm: Normal rate. Rhythm irregularly irregular.  Pulmonary:     Effort: Pulmonary effort is normal.  Neurological:     Mental Status: He is alert and oriented to person, place, and time.  Psychiatric:        Mood and Affect: Mood is depressed.        Behavior: Behavior normal.        Cognition and Memory: Cognition normal.    Vital Signs: BP (!) 103/48 (BP Location: Left Arm)   Pulse 96   Temp 97.9 F (36.6 C) (Oral)   Resp 20  Ht 5\' 10"  (1.778 m)   Wt 111.8 kg Comment: Scale A  SpO2 94%   BMI 35.37 kg/m  Pain Scale: 0-10   Pain Score: 0-No pain   SpO2: SpO2: 94 % O2 Device:SpO2: 94 % O2 Flow Rate: .O2 Flow Rate (L/min): 4 L/min   Palliative Assessment/Data: 50-60%     MDM: High   Marcey Persad Jeni Salles, PA-C  Palliative Medicine Team Team phone # [phone]  Thank you for allowing the Palliative Medicine Team to assist in the care of this patient. Please utilize secure chat with additional questions, if there is no response within 30 minutes please call the above phone number.  Palliative Medicine Team providers are available by phone from 7am to 7pm daily and can be reached through the team cell phone.  Should this patient require assistance outside of these hours, please call the patient's  attending physician.

## 2023-02-17 NOTE — Progress Notes (Signed)
Mobility Specialist Progress Note:   02/17/23 1118  Mobility  Activity Ambulated with assistance in hallway  Level of Assistance Contact guard assist, steadying assist  Assistive Device Front wheel walker  Distance Ambulated (ft) 150 ft  Activity Response Tolerated fair  Mobility Referral Yes  $Mobility charge 1 Mobility  Mobility Specialist Start Time (ACUTE ONLY) 0930  Mobility Specialist Stop Time (ACUTE ONLY) 0955  Mobility Specialist Time Calculation (min) (ACUTE ONLY) 25 min    Pre Mobility: 103 HR , 143/71 BP , 93% SpO2 4 L During Mobility: 133 HR , 82% -91% SpO2 4 L Post Mobility: 113 HR , 91% SpO2 4 L  Pt received in bed, agreeable to mobility. MinA to stand. CG during ambulation. Desat to 82% on 4 L during ambulation. Pursed lip breathing encouraged. Pt displayed heavy SOB requiring multiple standing rest breaks during ambulation. Pt c/o slight lightheadedness but denied any dizziness. Pt able to ambulate back to room and left asymptomatic in bed with call bell in hand and all needs met.   Leory Plowman  Mobility Specialist Please contact via [employer] office at [phone]

## 2023-02-17 NOTE — Progress Notes (Signed)
   02/17/23 1734  Vitals  Temp 98.7 F (37.1 C)  Pulse Rate 79  Resp (!) 21  BP 111/67  SpO2 94 %  O2 Device Nasal Cannula  Weight 110.5 kg (Bed Scale)  Type of Weight (S)  Post-Dialysis  Oxygen Therapy  O2 Flow Rate (L/min) 3 L/min  Patient Activity (if Appropriate) In bed  Pulse Oximetry Type Continuous  Post Treatment  Dialyzer Clearance Clear  Hemodialysis Intake (mL) 0 mL  Liters Processed 77.2  Fluid Removed (mL) 1500 mL  Tolerated HD Treatment Yes  Post-Hemodialysis Comments Pt. tolerated treatment without difficulties. VSS and report called to [facility] bedside Sherryle Lis RN   Received patient in bed to unit.  Alert and oriented.  Informed consent signed and in chart.   TX duration:3.5  Patient tolerated well.  Transported back to the room  Alert, without acute distress.  Hand-off given to patient's nurse.   Access used: Yes Access issues: No  Total UF removed: 1500 Medication(s) given: See MAR Post HD VS: See Above Grid Post HD weight: 110.5kg   [facility]

## 2023-02-17 NOTE — Telephone Encounter (Signed)
   Transition of Care Follow-up Phone Call Request    Patient Name: Dominic [name] Date of Birth: 25-Dec-1950 Date of Encounter: 02/17/2023  Primary Care Provider:  Montez Hageman, DO Primary Cardiologist:  Thurmon Fair, MD  [name] has been scheduled for a transition of care follow up appointment with a [facility] provider:  Joni Reining, NP, on 02/23/2023 at 2:45pm.  Please reach out to [facility] within 48 hours to confirm appointment and review transition of care protocol questionnaire.  Corrin Parker, PA-C  02/17/2023, 6:03 PM

## 2023-02-17 NOTE — Progress Notes (Signed)
ANTICOAGULATION CONSULT NOTE  Pharmacy Consult for heparin Indication: atrial fibrillation  Allergies  Allergen Reactions   Hydrochlorothiazide Itching and Other (See Comments)    Hyponatremia   Amoxicillin-Pot Clavulanate Nausea And Vomiting   Clindamycin Nausea And Vomiting and Other (See Comments)    Chest pain   Lisinopril Other (See Comments) and Cough    hyponatremia    Meloxicam Itching and Nausea Only   Topiramate Other (See Comments)   Tamsulosin Other (See Comments)    dysuria     Patient Measurements: Height: 5\' 10"  (177.8 cm) Weight: 111.8 kg (246 lb 7.6 oz) (Scale A) IBW/kg (Calculated) : 73 Heparin Dosing Weight: 100 kg  Vital Signs: Temp: 97.9 F (36.6 C) (09/07 0711) Temp Source: Oral (09/07 0711) BP: 104/61 (09/07 0904)  Labs: Recent Labs    02/15/23 0629 02/15/23 0630 02/16/23 0221 02/17/23 0231  HGB  --  10.3* 11.0* 10.8*  HCT  --  31.9* 33.6* 33.2*  PLT  --  183 196 197  APTT  --  66* 62*  --   HEPARINUNFRC  --  0.37 0.42 0.44  CREATININE 2.80*  --  2.48* 3.19*    Estimated Creatinine Clearance: 26.6 mL/min (A) (by C-G formula based on SCr of 3.19 mg/dL (H)).   Medical History: Past Medical History:  Diagnosis Date   Biventricular ICD (implantable cardioverter-defibrillator) - [facility] 09/05/2015   Jul 01, 2015 [facility] Dynagen X4   CAD (coronary artery disease) 09/05/2015   CABG 2015 PCI-BMS ostial LCX Sept 2015 04/05/2015 cath 50% distal LM, 90% prox LAD, old stent prox LAD 70% ISR, patent LIMA-LAD, 80% prox RCA PCI-DES x2 RCA and LCX    Chronic systolic CHF (congestive heart failure) (HCC) 09/05/2015   a. RHC 10/29/15: PCW 8, CI 2.2. Overall low filling presssures. Mean PA 20; b. echo 10/30/15: EF 45-50%, not tech suff to allwo for LV diastolic fxn, mild AI, nl appearing MVR   CKD stage 4 due to type 2 diabetes mellitus (HCC) 09/05/2015   Diabetes mellitus, type 2 (HCC) 09/05/2015   Essential hypertension 09/05/2015   History of mitral valve  replacement with bioprosthetic valve 09/05/2015   31 mm Edwards 2015, Dr. Arvilla Market   Postoperative atrial fibrillation (HCC) 09/05/2015   Assessment: 45 yoM [facility] evaluation of syncope and bradycardia from [facility]. Relevant PMH includes CAD s/p CABG/bMVR (2015) & multiple PCIs (most recent 2016), ICM w/ EF recovery (EF = 45-50%), staph IE s/p ICD extraction (2023), PAF (on Eliquis), and ESRD on HD (TTS). Pharmacy has been consulted to dose heparin for afib.  Last dose of eliquis given on 9/02 PM PTA.  Heparin level 0.44, therapeutic Current heparin infusion rate: 1300 units/hr  CBC stable No issues with heparin infusion or signs of bleeding reported.   Goal of Therapy:  Heparin level 0.3-0.7 units/ml Monitor platelets by anticoagulation protocol: Yes   Plan:  Continue heparin infusion at 1300 units/hr Check heparin level daily while on heparin Continue to monitor H&H and platelets F/u transition back to apixaban as per recommendations by IR after de-clotting intervention   Thank you for allowing pharmacy to be a part of this patient's care.  Wilmer Floor, PharmD PGY2 Cardiology Pharmacy Resident  02/17/2023 11:00 AM   Please refer to [facility] for pharmacy phone number

## 2023-02-17 NOTE — Progress Notes (Signed)
Progress Note  Patient Name: Dominic [name] Date of Encounter: 02/17/2023  Primary Cardiologist:   Thurmon Fair, MD   Subjective   He likely will insist on going home today.  Denies pain or SOB.  No further syncope.   Inpatient Medications    Scheduled Meds:  allopurinol  100 mg Oral Daily   amiodarone  200 mg Oral Daily   aspirin EC  81 mg Oral Daily   atorvastatin  40 mg Oral Daily   Chlorhexidine Gluconate Cloth  6 each Topical Daily   Chlorhexidine Gluconate Cloth  6 each Topical Q0600   cholecalciferol  2,000 Units Oral Daily   DULoxetine  60 mg Oral Daily   famotidine  10 mg Oral Daily   ferrous sulfate  325 mg Oral Q breakfast   hydrALAZINE  10 mg Oral TID   insulin aspart  0-6 Units Subcutaneous Q4H   isosorbide mononitrate  30 mg Oral Daily   midodrine  10 mg Oral Q T,Th,Sa-HD   mometasone-formoterol  2 puff Inhalation BID   nicotine  14 mg Transdermal Daily   sodium chloride flush  3 mL Intravenous Q12H   sodium chloride flush  3 mL Intravenous Q12H   sucralfate  1 g Oral BID   Continuous Infusions:  sodium chloride     sodium chloride     heparin 1,300 Units/hr (02/16/23 2138)   PRN Meds: sodium chloride, sodium chloride, acetaminophen, albuterol, alteplase, fluticasone, heparin, lidocaine (PF), lidocaine-prilocaine, midodrine, ondansetron (ZOFRAN) IV, pentafluoroprop-tetrafluoroeth, sodium chloride flush, sodium chloride flush   Vital Signs    Vitals:   02/16/23 2004 02/16/23 2300 02/17/23 0400 02/17/23 0711  BP: 107/84   (!) 103/48  Pulse: 96     Resp: 19  20   Temp: 98.5 F (36.9 C) 98.5 F (36.9 C) 98.1 F (36.7 C) 97.9 F (36.6 C)  TempSrc:  Oral Oral Oral  SpO2: 92%  92%   Weight:   111.8 kg   Height:        Intake/Output Summary (Last 24 hours) at 02/17/2023 0735 Last data filed at 02/17/2023 4132 Gross per 24 hour  Intake 577.23 ml  Output 900 ml  Net -322.77 ml   Filed Weights   02/15/23 1452 02/16/23 0417 02/17/23 0400   Weight: 112.8 kg 111.3 kg 111.8 kg    Telemetry    NSR, accelerated junctional rhythm - Personally Reviewed  ECG    NA - Personally Reviewed  Physical Exam   GEN: No acute distress.  Frail for his age Neck: No  JVD Cardiac: RRR, 2/6 systolic murmur, unable to appreciate diastolic murmurs, rubs, or gallops.  Respiratory:     Decreased breath sounds at the bases.  GI: Soft, nontender, non-distended  MS: No  edema; No deformity. Neuro:  Nonfocal  Psych: Normal affect   Labs    Chemistry Recent Labs  Lab 02/12/23 0003 02/13/23 1017 02/15/23 0629 02/16/23 0221 02/17/23 0231  NA 134*   < > 133* 135 131*  K 4.1   < > 3.9 3.6 3.3*  CL 105   < > 100 96* 96*  CO2 20*   < > 19* 24 21*  GLUCOSE 84   < > 122* 139* 122*  BUN 50*   < > 43* 27* 37*  CREATININE 3.26*   < > 2.80* 2.48* 3.19*  CALCIUM 8.3*   < > 8.4* 8.6* 8.4*  PROT 6.3*  --   --   --   --  ALBUMIN 2.2*  --  2.3*  --   --   AST 33  --   --   --   --   ALT 26  --   --   --   --   ALKPHOS 87  --   --   --   --   BILITOT 0.6  --   --   --   --   GFRNONAA 19*   < > 23* 27* 20*  ANIONGAP 9   < > 14 15 14    < > = values in this interval not displayed.     Hematology Recent Labs  Lab 02/15/23 0630 02/16/23 0221 02/17/23 0231  WBC 5.8 5.8 5.7  RBC 3.60* 3.85* 3.81*  HGB 10.3* 11.0* 10.8*  HCT 31.9* 33.6* 33.2*  MCV 88.6 87.3 87.1  MCH 28.6 28.6 28.3  MCHC 32.3 32.7 32.5  RDW 18.0* 18.0* 17.5*  PLT 183 196 197    Cardiac EnzymesNo results for input(s): "TROPONINI" in the last 168 hours. No results for input(s): "TROPIPOC" in the last 168 hours.   BNP Recent Labs  Lab 02/12/23 0005  BNP 1,051.6*     DDimer No results for input(s): "DDIMER" in the last 168 hours.   Radiology    IR DIALY SHUNT INTRO NEEDLE/INTRACATH INITIAL W/IMG RIGHT  Result Date: 02/16/2023 CLINICAL DATA:  End stage renal disease, difficult access of right upper arm hemodialysis fistula EXAM: DIALYSIS SHUNTOGRAM FLUOROSCOPY:  Radiation Exposure Index (as provided by the fluoroscopic device): 3 mGy air Kerma COMPARISON:  None Available. TECHNIQUE: An 18-gauge angiocatheter was placed antegrade into the outflow vein of the patient's AV hemodialysis fistula for dialysis fistulography. The angiocatheter and surrounding skin were then prepped with Betadine, draped in usual sterile fashion, infiltrated locally with 1% lidocaine the angiocatheter was exchanged over a Benson wire for a 6 [place] vascular sheath, through which a 5 French angled angiographic catheter was advanced in attempts to traverse central vein occlusion, which were ultimately unsuccessful. Once guidewire perforation occurred, attempts were terminated. After follow-up venography, the catheter, sheath, and guidewire were removed and hemostasis achieved with a 2-0 Ethilon purse-string suture. No immediate complication. FINDINGS: Patent brachiocephalic fistula above the elbow. The outflow vein occludes at the mid humeral shaft level, with no significant competing outflow veins. The outflow vein occlusion could not be traversed to reestablish good antegrade flow through the fistula. IMPRESSION: 1. Patent brachiocephalic fistula, with occlusion of the outflow vein at the level of the mid humeral shaft which could not be traversed. ACCESS: Not approachable for percutaneous intervention. Consider surgical consultation. Electronically Signed   By: Corlis Leak M.D.   On: 02/16/2023 18:01   ECHO TEE  Result Date: 02/15/2023    TRANSESOPHOGEAL ECHO REPORT   Patient Name:   Dominic [name] Date of Exam: 02/15/2023 Medical Rec #:  [id number]      Height:       70.0 in Accession #:    [id number]     Weight:       250.7 lb Date of Birth:  08-16-1950      BSA:          2.298 m Patient Age:    72 years       BP:           124/75 mmHg Patient Gender: M              HR:           87 bpm.  Exam Location:  Inpatient Procedure: Transesophageal Echo, Color Doppler, Cardiac Doppler and 3D Echo  Indications:    Prosthetic mitral valve stenosis  History:        Patient has prior history of Echocardiogram examinations, most                 recent 02/13/2023. CHF, CAD, Defibrillator, Mitral Valve Disease,                 Mitral Stenosis and Aortic Valve Disease, Arrythmias:Atrial                 Fibrillation and LBBB; Risk Factors:Diabetes and Hypertension.                 CKD.                  Mitral Valve: 31 mm Edwards-Sapien bioprosthetic valve valve is                 present in the mitral position. Procedure Date: 09/05/2015.  Sonographer:    Milda Smart Referring Phys: [id number] DAYNA N DUNN PROCEDURE: After discussion of the risks and benefits of a TEE, an informed consent was obtained from the patient. TEE procedure time was 42 minutes. The transesophogeal probe was passed without difficulty through the esophogus of the patient. Imaged were obtained with the patient in a left lateral decubitus position. Local oropharyngeal anesthetic was provided with Cetacaine. Sedation performed by different physician. The patient was monitored while under deep sedation. Anesthestetic sedation was provided intravenously by Anesthesiology: 436.61mg  of Propofol. Image quality was excellent. The patient's vital signs; including heart rate, blood pressure, and oxygen saturation; remained stable throughout the procedure. The patient developed no complications during the procedure.  IMPRESSIONS  1. Left ventricular ejection fraction, by estimation, is 30%. The left ventricle has moderately decreased function. The left ventricle demonstrates global hypokinesis and abnormal septal motion secondary to conduction delay.  2. Right ventricular systolic function is moderately reduced. The right ventricular size is normal.  3. Left atrial size was severely dilated. No left atrial/left atrial appendage thrombus was detected. The LAA emptying velocity was 25 cm/s.  4. Right atrial size was moderately dilated.  5. The mitral valve has  been repaired/replaced. Mild mitral valve regurgitation. Severe prosthetic mitral valve stenosis. Prosthetic valve leaflets are calcified and thickened with restricted opening. No definite valvular vegetations, no dehiscence of the prosthesis. The mean mitral valve gradient is 13.0 mmHg at HR 83 bpm. 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 Procedure Date: 09/05/2015. Echo findings are consistent with stenosis the mitral prosthesis.  6. There is a calcified small lesion on the atrial aspect of the tricuspid valve, likely anterior leaflet best seen at 45 deg (clip 86 and 90). This could represent degenerative calcium vs healed calcified vegetation. Patient has had a previous RV lead.  7. The aortic valve is calcified. There is severe calcifcation of the aortic valve. There is moderate thickening of the aortic valve. Aortic valve regurgitation is moderate, vena contracta 0.5 cm. Moderate aortic valve stenosis. Aortic valve area, by VTI measures 1.32 cm. Aortic valve mean gradient measures 24.0 mmHg. Aortic valve Vmax measures 3.11 m/s.  8. Large pleural effusion.  9. Aortic dilatation noted. There is mild dilatation of the ascending aorta, measuring 42 mm. There is Moderate (Grade III) plaque. FINDINGS  Left Ventricle: Left ventricular ejection fraction, by estimation, is 30%. The left ventricle has moderately decreased function. The  left ventricle demonstrates global hypokinesis. The left ventricular internal cavity size was normal in size. Concentric  left ventricular hypertrophy. Abnormal (paradoxical) septal motion, consistent with left bundle branch block. Right Ventricle: The right ventricular size is normal. Right vetricular wall thickness was not well visualized. Right ventricular systolic function is moderately reduced. Left Atrium: Left atrial size was severely dilated. No left atrial/left atrial appendage thrombus was detected. The LAA emptying velocity was 25  cm/s. Right Atrium: Right atrial size was moderately dilated. Pericardium: Trivial pericardial effusion is present. Mitral Valve: The mitral valve has been repaired/replaced. Mild mitral valve regurgitation. 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 Procedure Date: 09/05/2015. 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 Severe mitral valve stenosis. MV peak gradient, 16.8 mmHg. The mean mitral valve gradient is 13.0 mmHg with average heart rate of 87 bpm. Tricuspid Valve: There is a calcified small lesion on the atrial aspect of the tricuspid valve, likely anterior leaflet best seen at 45 deg (clip 86 and 90). This could represent degenerative calcium vs healed calcified vegetation. Patient has had a previous RV lead. The tricuspid valve is grossly normal. Tricuspid valve regurgitation is mild. Aortic Valve: AV vena contracta 0.5 cm. The aortic valve is calcified. There is severe calcifcation of the aortic valve. There is moderate thickening of the aortic valve. Aortic valve regurgitation is moderate. Aortic regurgitation PHT measures 297 msec.  Moderate aortic stenosis is present. Aortic valve mean gradient measures 24.0 mmHg. Aortic valve peak gradient measures 38.7 mmHg. Aortic valve area, by VTI measures 1.32 cm. There is no evidence of aortic valve vegetation. Pulmonic Valve: The pulmonic valve was not well visualized. Pulmonic valve regurgitation is mild. Aorta: The aortic root is normal in size and structure and aortic dilatation noted. There is mild dilatation of the ascending aorta, measuring 42 mm. There is moderate (Grade III) plaque. IAS/Shunts: No atrial level shunt detected by color flow Doppler. Additional Comments: There is a large pleural effusion. Spectral Doppler performed. LEFT VENTRICLE PLAX 2D LVOT diam:     2.20 cm LV SV:         82 LV SV Index:   36 LVOT Area:     3.80 cm  AORTIC VALVE AV Area (Vmax):    1.28 cm AV Area  (Vmean):   1.20 cm AV Area (VTI):     1.32 cm AV Vmax:           311.00 cm/s AV Vmean:          235.000 cm/s AV VTI:            0.627 m AV Peak Grad:      38.7 mmHg AV Mean Grad:      24.0 mmHg LVOT Vmax:         105.00 cm/s LVOT Vmean:        73.900 cm/s LVOT VTI:          0.217 m LVOT/AV VTI ratio: 0.35 AI PHT:            297 msec  AORTA Ao Root diam: 3.70 cm Ao Asc diam:  4.20 cm MITRAL VALVE MV Peak grad: 16.8 mmHg  SHUNTS MV Mean grad: 13.0 mmHg  Systemic VTI:  0.22 m MV Vmax:      2.05 m/s   Systemic Diam: 2.20 cm MV Vmean:     174.0 cm/s Weston Brass MD Electronically signed by Weston Brass MD Signature Date/Time: 02/15/2023/1:45:56 PM  Final    EP STUDY  Result Date: 02/15/2023 See surgical note for result.   Cardiac Studies    Right and Left Heart Cath:    Mid LM to Ost LAD lesion is 40% stenosed.   Ramus lesion is 100% stenosed.   Ost LAD to Prox LAD lesion is 80% stenosed.  Previously placed Mid LAD lesion is 100% stenosed.   Previously placed Ost Cx to Prox Cx stent of unknown type is  widely patent.   Prox Cx to Mid Cx lesion is 65% stenosed.   1st Mrg lesion is 50% stenosed.   Previously placed Prox RCA lesion is 40% stenosed -focal ISR.   Mid RCA lesion is 85% stenosed.   Mid RCA to Dist RCA lesion is 20% stenosed.  Dist RCA lesion is 20% stenosed.   SVG-RI graft was visualized by angiography and is large.  The graft exhibits no disease.   LIMA-LAD graft was visualized by angiography and is large.  The graft exhibits no disease.   TEE:   Severe prosthetic mitral valve stenosis, gradient @ HR 81. Mild MR.  Moderate AS and at least moderate-severe AI, mean sys gradient 24 mmHg. PHT 297 ms.  Mild TR No valvular vegetations identified. Degenerative calcifications on the mitral and aortic valves.  EF ~ 30%, dyssynchrony due to conduction delay. Moderate RV dysfunction.  Severe LA dilation, moderate RA dilation.   TTE 02/13/2023  1. There is no left ventricular  thrombus (Definity contrast was used).  There is severe septal-lateral dyssynchrony due to LBBB. Left ventricular  ejection fraction, by estimation, is 35 to 40%. The left ventricle has  moderately decreased function. The  left ventricle demonstrates global hypokinesis. The left ventricular  internal cavity size was mildly dilated. There is mild concentric left  ventricular hypertrophy. Indeterminate diastolic filling due to E-A  fusion.   2. Right ventricular systolic function is moderately reduced. The right  ventricular size is moderately enlarged. There is moderately elevated  pulmonary artery systolic pressure. The estimated right ventricular  systolic pressure is 57.3 mmHg.   3. Left atrial size was severely dilated.   4. Right atrial size was moderately dilated.   5. The mitral valve has been repaired/replaced. No evidence of mitral  valve regurgitation. Severe mitral stenosis. The mean mitral valve  gradient is 18.0 mmHg with average heart rate of 95 bpm. There is a 31 mm  Edwards Sapien bioprosthetic valve present   in the mitral position. Procedure Date: 09/05/2015. Echo findings are  consistent with stenosis the mitral prosthesis.   6. Large pleural effusion.   7. Tricuspid valve regurgitation is moderate to severe.   8. The aortic valve is tricuspid. There is moderate calcification of the  aortic valve. There is moderate thickening of the aortic valve. Aortic  valve regurgitation is moderate. Moderate aortic valve stenosis. Aortic  regurgitation PHT measures 319 msec.  Aortic valve mean gradient measures 23.3 mmHg. Aortic valve Vmax measures  3.21 m/s.   9. Aortic dilatation noted. There is mild dilatation of the ascending  aorta, measuring 39 mm. There is mild dilatation of the aortic root,  measuring 42 mm.  10. Hepatic vein flow reversal is seen is seen intermittently (likely with  respiration, spirometry was not performed). The inferior vena cava is  dilated in size  with >50% respiratory variability, suggesting right atrial  pressure of 8 mmHg.   Patient Profile     72 y.o. 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 Course complicated by acute systolic heart failure and volume overload. Also likely non-STEMI which is secondary.   Assessment & Plan    Syncope:  Secondary to bradycardia most likely.  Leadless pacer was discussed and the patient declined this.  No further work up. EP has signed off.   Avoid AV nodal blocking agets   CAD/NSTEMI/CABG:    Disease as above.  Possible staged PCI if he has valve replacement as below.  For now medical management. [name]    PAF:   Avoid beta blockers.  Continue amiodarone.  Maintaining regular rhythm. On heparin.   Resume Eliquis before discharge if no further attempts at invasive procedure.    AS:  Severe.  Will consider eventually for TAVR. However, given his indwelling HD catheter he is currently not a candidate for either.   He might not want this either.    Bioprosthetic MV:   Severe MS.  Plan medical management for now with consideration of transcatheter mitral valve replacement when other medical issues have been addressed.  Again, patient would likely decline  COPD:  Continue therapy.   He has home O2 that he uses continuously.    DM:    Continue current therapy.    ESRD:  Occluded fistula.  This could not be recanalized via IR.  Plan per renal.   Dialysis today.     Ischemic CM :  Worsening EF multifactorial.  Management limited with bradycardia and ESRD.  Volume management per dialysis.    Continue Imdur and hydralazine.    FEN:  Follow Na with dialysis.  Supplemented potassium.   For questions or updates, please contact [facility] Please consult [email or web address] for contact info under Cardiology/STEMI.    Signed, Rollene Rotunda, MD  02/17/2023, 7:35 AM

## 2023-02-17 NOTE — Progress Notes (Signed)
[facility] Progress Note   Subjective:    Seen and examined patient at bedside. 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 Will continue using his [facility] for today's HD. Reviewed Cardiology note: patient doesn't want to proceed with pacemaker and to continue medical management for CAD and severe MS. Discussed with Cardiology PA and planning for discharge after HD today. His AVF will need to be addressed at his outpatient hemodialysis clinic.   Objective Vitals:   02/17/23 0711 02/17/23 0737 02/17/23 0904 02/17/23 1124  BP: (!) 103/48  104/61 105/63  Pulse:      Resp:      Temp: 97.9 F (36.6 C)   98.6 F (37 C)  TempSrc: Oral   Oral  SpO2:  94%    Weight:      Height:       Physical Exam General: Alert male in NAD, on 4L 02 [place] (chronically on O2 at home) Heart: RRR, 2/6 murmur, no rubs or gallops Lungs: Mildly diminished at lowers, clear in uppers  Abdomen: Soft, non-distended, +BS Extremities: Trace L ankle edema, No edema RLE Dialysis Access: R internal jugular TDC, RUE AVF pulsatile   Filed Weights   02/15/23 1452 02/16/23 0417 02/17/23 0400  Weight: 112.8 kg 111.3 kg 111.8 kg    Intake/Output Summary (Last 24 hours) at 02/17/2023 1255 Last data filed at 02/17/2023 0911 Gross per 24 hour  Intake 810.2 ml  Output 700 ml  Net 110.2 ml    Additional Objective Labs: Basic Metabolic Panel: Recent Labs  Lab 02/13/23 0159 02/13/23 1017 02/15/23 0629 02/16/23 0221 02/17/23 0231  NA  --    < > 133* 135 131*  K  --    < > 3.9 3.6 3.3*  CL  --    < > 100 96* 96*  CO2  --    < > 19* 24 21*  GLUCOSE  --    < > 122* 139* 122*  BUN  --    < > 43* 27* 37*  CREATININE  --    < > 2.80* 2.48* 3.19*  CALCIUM  --    < > 8.4* 8.6* 8.4*  PHOS 3.7  --  3.7  --   --    < > = values in this interval not displayed.   Liver Function Tests: Recent Labs  Lab 02/12/23 0003  02/15/23 0629  AST 33  --   ALT 26  --   ALKPHOS 87  --   BILITOT 0.6  --   PROT 6.3*  --   ALBUMIN 2.2* 2.3*   No results for input(s): "LIPASE", "AMYLASE" in the last 168 hours. CBC: Recent Labs  Lab 02/12/23 0003 02/13/23 1017 02/14/23 0221 02/14/23 1417 02/14/23 1734 02/15/23 0630 02/16/23 0221 02/17/23 0231  WBC 7.0   < > 5.0  --  5.8 5.8 5.8 5.7  NEUTROABS 5.2  --   --   --   --   --   --   --   HGB 10.6*   < > 10.4*   < > 11.0* 10.3* 11.0* 10.8*  HCT 33.2*   < > 31.7*   < > 33.4* 31.9* 33.6* 33.2*  MCV 92.0   < > 90.1  --  90.8 88.6 87.3 87.1  PLT 179   < > 171  --  187 183 196 197   < > = values in this interval not displayed.  Blood Culture No results found for: "SDES", "SPECREQUEST", "CULT", "REPTSTATUS"  Cardiac Enzymes: No results for input(s): "CKTOTAL", "CKMB", "CKMBINDEX", "TROPONINI" in the last 168 hours. CBG: Recent Labs  Lab 02/16/23 2041 02/16/23 2339 02/17/23 0415 02/17/23 0733 02/17/23 1135  GLUCAP 149* 110* 135* 191* 160*   Iron Studies: No results for input(s): "IRON", "TIBC", "TRANSFERRIN", "FERRITIN" in the last 72 hours. Lab Results  Component Value Date   INR 1.2 02/12/2023   INR 1.2 06/28/2022   Studies/Results: IR DIALY SHUNT INTRO NEEDLE/INTRACATH INITIAL W/IMG RIGHT  Result Date: 02/16/2023 CLINICAL DATA:  End stage renal disease, difficult access of right upper arm hemodialysis fistula EXAM: DIALYSIS SHUNTOGRAM FLUOROSCOPY: Radiation Exposure Index (as provided by the fluoroscopic device): 3 mGy air Kerma COMPARISON:  None Available. TECHNIQUE: An 18-gauge angiocatheter was placed antegrade into the outflow vein of the patient's AV hemodialysis fistula for dialysis fistulography. The angiocatheter and surrounding skin were then prepped with Betadine, draped in usual sterile fashion, infiltrated locally with 1% lidocaine the angiocatheter was exchanged over a Benson wire for a 6 [place] vascular sheath, through which a 5 French  angled angiographic catheter was advanced in attempts to traverse central vein occlusion, which were ultimately unsuccessful. Once guidewire perforation occurred, attempts were terminated. After follow-up venography, the catheter, sheath, and guidewire were removed and hemostasis achieved with a 2-0 Ethilon purse-string suture. No immediate complication. FINDINGS: Patent brachiocephalic fistula above the elbow. The outflow vein occludes at the mid humeral shaft level, with no significant competing outflow veins. The outflow vein occlusion could not be traversed to reestablish good antegrade flow through the fistula. IMPRESSION: 1. Patent brachiocephalic fistula, with occlusion of the outflow vein at the level of the mid humeral shaft which could not be traversed. ACCESS: Not approachable for percutaneous intervention. Consider surgical consultation. Electronically Signed   By: Corlis Leak M.D.   On: 02/16/2023 18:01    Medications:  sodium chloride     sodium chloride     heparin 1,300 Units/hr (02/17/23 0740)    allopurinol  100 mg Oral Daily   amiodarone  200 mg Oral Daily   aspirin EC  81 mg Oral Daily   atorvastatin  40 mg Oral Daily   Chlorhexidine Gluconate Cloth  6 each Topical Daily   Chlorhexidine Gluconate Cloth  6 each Topical Q0600   cholecalciferol  2,000 Units Oral Daily   DULoxetine  60 mg Oral Daily   famotidine  10 mg Oral Daily   ferrous sulfate  325 mg Oral Q breakfast   hydrALAZINE  10 mg Oral TID   insulin aspart  0-6 Units Subcutaneous Q4H   isosorbide mononitrate  30 mg Oral Daily   midodrine  10 mg Oral Q T,Th,Sa-HD   mometasone-formoterol  2 puff Inhalation BID   nicotine  14 mg Transdermal Daily   potassium chloride  20 mEq Oral BID   sodium chloride flush  3 mL Intravenous Q12H   sodium chloride flush  3 mL Intravenous Q12H   sucralfate  1 g Oral BID    Dialysis Orders: [facility] TTS Dr Lequita Halt 3.5h 112kg   300/600   3K/2.5Ca bath  TDC   Heparin  none  Assessment/Plan: 1.Syncope/ bradycardia - cardiology consulting. EF 35% with severe stenosis of prosthetic mitral valve. Cardiology is hoping [facility] can be removed to get another CRT device at some point. Unfortunately, fistulogram from yesterday showed outflow occluded several cm central to brachiocephalic and could not be recanalized centrally. Will need to continue  using his [facility] for now. Per last Cardiology note: patient doesn't want to proceed with a pacemaker and to continue medical management for his CAD and severe MS. His access will need to be addressed with his primary nephrologist at his outpatient hemodialysis center.  2.Acute on chronic hypoxic resp failure - w/ pulm edema by CXR. Push UF as tolerated.  3.ESRD - on HD TTS. Has not missed HD lately, Continue TTS schedule. Noted K+ 3.3, using 4K bath with HD today. 4.HTN/ volume - BP's are borderline low. Holding home HTN meds and nitrates. Removing volume as tolerated. Started Midodrine 10mg  with HD here. Will continue this in outpatient and his primary nephrologist can make further adjustments with his regimen if needed. 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 Last phos from 9/5 at goal so not on a binder. 7.NSTEMI - per cardiology 8.PAF- bradycardia this admission, see above. Rate currently controlled.   Salome Holmes, NP [facility] 02/17/2023,12:55 PM  LOS: 5 days

## 2023-02-17 NOTE — Discharge Instructions (Addendum)
Medication Changes: - START Plavix 75mg  once daily. - START Midodrine 10mg  on the morning of dialysis (Tuesdays, Thursdays, Saturdays) - DECREASE Hydralazine to 10mg  twice daily. You can hold the morning dose on your dialysis. - DECREASE Isosorbide mononitrate (Imdur) to 30mg  daily. You can hold this on the mornings of dialysis. - STOP Torsemide. - STOP Potassium supplement.  - CONTINUE Eliquis 5mg  twice daily. - CONTINUE Amiodarone 200mg  daily. - CONTINUE Lipitor 40mg  daily.  Please continue all other medications as listed on your discharge summary. We discontinued your Glipizide because you said you were not taking this on admission. We also discontinue the Hydrocodone-Acetaminophen because it looked like this was an old prescription and you did not require it during admission. If you have any questions about these medications, please reach out to the provider who initially prescribed them.  Post Cardiac Catheterization: NO HEAVY LIFTING OR SEXUAL ACTIVITY X 7 DAYS. NO DRIVING X 1 WEEK. NO SOAKING BATHS, HOT TUBS, POOLS, ETC., X 7 DAYS.  Groin Site Care: Refer to this sheet in the next few weeks. These instructions provide you with information on caring for yourself after your procedure. Your caregiver may also give you more specific instructions. Your treatment has been planned according to current medical practices, but problems sometimes occur. Call your caregiver if you have any problems or questions after your procedure. HOME CARE INSTRUCTIONS You may shower 24 hours after the procedure. Remove the bandage (dressing) and gently wash the site with plain soap and water. Gently pat the site dry.  Do not apply powder or lotion to the site.  Do not sit in a bathtub, swimming pool, or whirlpool for 5 to 7 days.  No bending, squatting, or lifting anything over 10 pounds (4.5 kg) as directed by your caregiver.  Inspect the site at least twice daily.  Do not drive home if you are discharged  the same day of the procedure. Have someone else drive you.  What to expect: Any bruising will usually fade within 1 to 2 weeks.  Blood that collects in the tissue (hematoma) may be painful to the touch. It should usually decrease in size and tenderness within 1 to 2 weeks.  SEEK IMMEDIATE MEDICAL CARE IF: You have unusual pain at the groin site or down the affected leg.  You have redness, warmth, swelling, or pain at the groin site.  You have drainage (other than a small amount of blood on the dressing).  You have chills.  You have a fever or persistent symptoms for more than 72 hours.  You have a fever and your symptoms suddenly get worse.  Your leg becomes pale, cool, tingly, or numb. You have heavy bleeding from the site. Hold pressure on the site.

## 2023-02-17 NOTE — TOC Transition Note (Signed)
Transition of Care [facility]) - CM/SW Discharge Note   Patient Details  Name: Dominic [name] MRN: [id number] Date of Birth: 1951-05-14  [facility] CM/SW Contact:  Ronny Bacon, RN Phone Number: 02/17/2023, 2:11 PM   Clinical Narrative:  Patient is being discharged and has home oxygen services.     Final next level of care: Home/Self Care Barriers to Discharge: Continued Medical Work up   Patient Goals and CMS Choice      Discharge Placement                         Discharge Plan and Services Additional resources added to the After Visit Summary for     Discharge Planning Services: CM Consult            DME Arranged: N/A         HH Arranged: NA          Social Determinants of Health (SDOH) Interventions SDOH Screenings   Food Insecurity: No Food Insecurity (02/12/2023)  Housing: Low Risk  (02/12/2023)  Transportation Needs: No Transportation Needs (02/12/2023)  Utilities: Not At Risk (02/12/2023)  Financial Resource Strain: Medium Risk (02/11/2023)   Received from [facility]  Physical Activity: Inactive (02/11/2023)   Received from [facility]  Social Connections: Moderately Isolated (02/11/2023)   Received from [facility]  Stress: No Stress Concern Present (02/11/2023)   Received from [facility]  Tobacco Use: High Risk (02/15/2023)  Health Literacy: High Risk (02/11/2023)   Received from [facility]     Readmission Risk Interventions     No data to display

## 2023-02-17 NOTE — Progress Notes (Signed)
Patient given discharge instructions, all questions answered. Iv's removed. Patient wheeled to main entrance to private vehicle and oxygen transferred to home oxygen tank.

## 2023-02-19 NOTE — Telephone Encounter (Signed)
Left voicemail to return call to office.

## 2023-02-19 NOTE — Progress Notes (Signed)
Late Note Entry- February 19, 2023  Pt was d/c to home on Saturday. Contacted [facility] this morning to advise clinic staff of pt's d/c date and that pt should resume care tomorrow. Pt's d/c summary, Palliative Care consult, and last renal note faxed to clinic for continuation of care.   Olivia Canter Renal Navigator [phone]

## 2023-02-20 NOTE — Telephone Encounter (Signed)
Returned call to pt. LVM to return call to the office. Attempt 2

## 2023-02-21 NOTE — Telephone Encounter (Signed)
Patient contacted regarding discharge from [facility] on 02/17/23.  Patient understands to follow up with provider K. Lawrence on 9/13 at 2:45 at [address] office. Patient understands discharge instructions? Yes Patient understands medications and regiment? Yes Patient understands to bring all medications to this visit? Yes  Ask patient:  Are you enrolled in My Chart Yes                How is your pain controlled? Pain level? Controlled              If you require a refill on pain medications, know that the same medication/ amount may not be prescribed or a refill may not be given.  Please contact your pharmacy for refill requests.               Do you have help at home with ADL's? None needed at this time

## 2023-02-21 NOTE — Progress Notes (Deleted)
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  Seen during recent hospitalization from 02/12/19/2024 through 02/17/2019 for in the setting of syncope and bradycardia.  He was initially seen at [facility] ED is found to have elevated BNP of 1150.  But because of syncopal event he was transferred to [facility].  He was not felt to be a good candidate for temporary pacemaker, CRT-D reimplantation or Micra device.  Metoprolol was discontinued and he was to avoid all AV nodal agents.  He has also been referred to palliative care as the patient was not interested in any aggressive interventions procedure including valve replacement/repair or pacemaker implantation.  It was noted that his echocardiogram revealed LVEF of 35 to 40% with global hypokinesis and severe septal-lateral dyssynchrony due to left bundle branch block as well as moderately enlarged RV with moderately reduced systolic function and moderately elevated PASP of 57.3 mmHg.  He did have a left heart catheterization which showed severe multivessel disease and LIMA to LAD, SVG to RI but 85% stenosis of the mid RCA and 65% stenosis of the left circumflex.  Medical therapy was recommended would likely need for staged imaging guided PCI of the RCA and potential FFR guided PCI to the left circumflex after  dialysis fistula cleared and catheter removed.  He was started on Plavix 75 mg daily on day of discharge and was to continue high dose statin therapy.  Given Eliquis dose and aspirin was not prescribed.  It was noted that hydralazine and Imdur were decreased due to hypotension.  He was discharged on 02/17/2023.  Past Medical History    Past Medical History:  Diagnosis Date   Biventricular ICD (implantable cardioverter-defibrillator) - [facility] 09/05/2015   Jul 01, 2015 [facility] Dynagen X4   CAD (coronary artery disease) 09/05/2015   CABG 2015 PCI-BMS ostial LCX Sept 2015 04/05/2015 cath 50% distal LM, 90% prox LAD, old stent prox LAD 70% ISR, patent LIMA-LAD, 80% prox RCA PCI-DES x2 RCA and LCX    Chronic systolic CHF (congestive heart failure) (HCC) 09/05/2015   a. RHC 10/29/15: PCW 8, CI 2.2. Overall low filling presssures. Mean PA 20; b. echo 10/30/15: EF 45-50%, not tech suff to allwo for LV diastolic fxn, mild AI, nl appearing MVR   CKD stage 4 due to type 2 diabetes mellitus (HCC) 09/05/2015   Diabetes mellitus, type 2 (HCC) 09/05/2015   Essential hypertension 09/05/2015   History of mitral valve replacement with bioprosthetic valve 09/05/2015   31 mm Edwards 2015, Dr. Arvilla Market   Postoperative atrial fibrillation (HCC) 09/05/2015   Past Surgical History:  Procedure Laterality Date   CARDIAC CATHETERIZATION N/A 10/29/2015   Procedure: Right Heart Cath;  Surgeon: Dolores Patty, MD;  Location: [facility] INVASIVE CV LAB;  Service: Cardiovascular;  Laterality: N/A;   CARDIAC VALVE REPLACEMENT  2015   31mm Edwards bioprosthesis   CORONARY ANGIOPLASTY     CORONARY ARTERY BYPASS GRAFT  2015   [facility], Dr. Arvilla Market.   IR DIALY SHUNT INTRO NEEDLE/INTRACATH INITIAL W/IMG RIGHT Right 02/16/2023   RIGHT/LEFT HEART CATH AND CORONARY/GRAFT ANGIOGRAPHY N/A 02/14/2023   Procedure: RIGHT/LEFT HEART CATH AND CORONARY/GRAFT ANGIOGRAPHY;  Surgeon: Marykay Lex, MD;  Location: [facility] INVASIVE CV LAB;  Service: Cardiovascular;  Laterality:  N/A;   TEE WITHOUT CARDIOVERSION N/A 04/26/2021   Procedure: TRANSESOPHAGEAL ECHOCARDIOGRAM (TEE);  Surgeon: Chrystie Nose, MD;  Location: [facility] ENDOSCOPY;  Service: Cardiovascular;  Laterality: N/A;   TEE WITHOUT CARDIOVERSION N/A 02/15/2023   Procedure: TRANSESOPHAGEAL ECHOCARDIOGRAM;  Surgeon: Parke Poisson, MD;  Location: [facility] INVASIVE CV LAB;  Service: Cardiovascular;  Laterality: N/A;    Allergies  Allergies  Allergen Reactions   Hydrochlorothiazide Itching and Other (See Comments)    Hyponatremia   Amoxicillin-Pot Clavulanate Nausea And Vomiting   Clindamycin Nausea And Vomiting and Other (See Comments)    Chest pain   Lisinopril Other (See Comments) and Cough    hyponatremia    Meloxicam Itching and Nausea Only   Topiramate Other (See Comments)   Tamsulosin Other (See Comments)    dysuria     History of Present Illness    ***  Home Medications    Current Outpatient Medications  Medication Sig Dispense Refill   acetaminophen (TYLENOL) 500 MG tablet Take 500 mg by mouth every 6 (six) hours as needed for moderate pain.     albuterol (VENTOLIN HFA) 108 (90 Base) MCG/ACT inhaler Inhale 1-2 puffs into the lungs every 4 (four) hours as needed.     allopurinol (ZYLOPRIM) 100 MG tablet Take 100 mg by mouth daily.     amiodarone (PACERONE) 200 MG tablet Take 1 tablet (200 mg total) by mouth daily. 90 tablet 3   apixaban (ELIQUIS) 5 MG TABS tablet Take 5 mg by mouth in the morning and at bedtime.     atorvastatin (LIPITOR) 40 MG tablet Take 40 mg by mouth daily.     B Complex-C (SUPER B COMPLEX PO) Take 1 capsule by mouth daily.     Cholecalciferol (VITAMIN D) 50 MCG (2000 UT) tablet Take 2,000 Units by mouth daily.     clopidogrel (PLAVIX) 75 MG tablet Take 1 tablet (75 mg total) by mouth daily. 90 tablet 3   DULoxetine (CYMBALTA) 60 MG capsule Take 60 mg by mouth daily.      famotidine (PEPCID) 20 MG tablet Take 10 mg by mouth daily.     ferrous sulfate 325 (65 FE) MG  tablet Take 325 mg by mouth daily with breakfast.     fluticasone (FLONASE) 50 MCG/ACT nasal spray Place 1 spray into both nostrils daily as needed for allergies.     fluticasone-salmeterol (ADVAIR HFA) 45-21 MCG/ACT inhaler Inhale 2 puffs into the lungs 2 (two) times daily.     gabapentin (NEURONTIN) 100 MG capsule Take 100 mg by mouth 2 (two) times daily.     hydrALAZINE (APRESOLINE) 10 MG tablet Take 1 tablet (10 mg total) by mouth 2 (two) times daily. You can hold morning dose on dialysis days. 30 tablet 2   hydrocortisone cream 1 % Apply 1 application  topically daily as needed for itching.     INCRUSE ELLIPTA 62.5 MCG/ACT AEPB Inhale 1 puff into the lungs daily.     isosorbide mononitrate (IMDUR) 30 MG 24 hr tablet Take 1 tablet (30 mg total) by mouth daily. You can hold on mornings of dialysis days. 30 tablet 2   LANTUS SOLOSTAR 100 UNIT/ML  Solostar Pen Inject 30-40 Units into the skin 2 (two) times daily. Take 40 units in the morning and 30 units in the evening     midodrine (PROAMATINE) 10 MG tablet Take 1 tablet (10 mg total) by mouth Every Tuesday,Thursday,and Saturday with dialysis. 12 tablet 3   nitroGLYCERIN (NITROSTAT) 0.4 MG SL tablet Place 1 tablet (0.4 mg total) under the tongue every 5 (five) minutes as needed for chest pain. 25 tablet 6   polyethylene glycol powder (GLYCOLAX/MIRALAX) 17 GM/SCOOP powder Take 1 Container by mouth daily as needed for moderate constipation.     sucralfate (CARAFATE) 1 g tablet Take 1 tablet (1 g total) by mouth 2 (two) times daily.     tiZANidine (ZANAFLEX) 2 MG tablet Take 2 mg by mouth every 8 (eight) hours as needed for muscle spasms.     No current facility-administered medications for this visit.     Family History    Family History  Problem Relation Age of Onset   Hypertension Father    Hyperlipidemia Father    He indicated that the status of his father is unknown. He indicated that his maternal grandmother is deceased. He indicated  that his maternal grandfather is deceased. He indicated that his paternal grandmother is deceased. He indicated that his paternal grandfather is deceased.  Social History    Social History   Socioeconomic History   Marital status: Married    Spouse name: Not on file   Number of children: 0   Years of education: Not on file   Highest education level: Not on file  Occupational History   Not on file  Tobacco Use   Smoking status: Some Days    Current packs/day: 0.50    Average packs/day: 0.5 packs/day for 40.0 years (20.0 ttl pk-yrs)    Types: Cigarettes   Smokeless tobacco: Never   Tobacco comments:    01/02/2022- Patient smokes about 1/2 half pack daily    06/19/2022- Patient smokes about 10 cigarettes a day  Substance and Sexual Activity   Alcohol use: No    Alcohol/week: 0.0 standard drinks of alcohol   Drug use: No   Sexual activity: Not Currently  Other Topics Concern   Not on file  Social History Narrative   Not on file   Social Determinants of Health   Financial Resource Strain: Medium Risk (02/11/2023)   Received from [facility]   Overall Financial Resource Strain (CARDIA)    Difficulty of Paying Living Expenses: Somewhat hard  Food Insecurity: No Food Insecurity (02/12/2023)   Hunger Vital Sign    Worried About Running Out of Food in the Last Year: Never true    Ran Out of Food in the Last Year: Never true  Transportation Needs: No Transportation Needs (02/12/2023)   PRAPARE - Administrator, Civil Service (Medical): No    Lack of Transportation (Non-Medical): No  Physical Activity: Inactive (02/11/2023)   Received from [facility]   Exercise Vital Sign    Days of Exercise per Week: 0 days    Minutes of Exercise per Session: 0 min  Stress: No Stress Concern Present (02/11/2023)   Received from [facility] - Occupational Stress Questionnaire    Feeling of Stress : Only a little  Social Connections:  Moderately Isolated (02/11/2023)   Received from [facility]   Social Connection and Isolation Panel [NHANES]    Frequency of Communication with Friends  and Family: Once a week    Frequency of Social Gatherings with Friends and Family: Once a week    Attends Religious Services: More than 4 times per year    Active Member of [employer] or Organizations: No    Attends Banker Meetings: Never    Marital Status: Married  Catering manager Violence: Not At Risk (02/12/2023)   Humiliation, Afraid, Rape, and Kick questionnaire    Fear of Current or Ex-Partner: No    Emotionally Abused: No    Physically Abused: No    Sexually Abused: No     Review of Systems    General:  No chills, fever, night sweats or weight changes.  Cardiovascular:  No chest pain, dyspnea on exertion, edema, orthopnea, palpitations, paroxysmal nocturnal dyspnea. Dermatological: No rash, lesions/masses Respiratory: No cough, dyspnea Urologic: No hematuria, dysuria Abdominal:   No nausea, vomiting, diarrhea, bright red blood per rectum, melena, or hematemesis Neurologic:  No visual changes, wkns, changes in mental status. All other systems reviewed and are otherwise negative except as noted above.       Physical Exam    VS:  There were no vitals taken for this visit. , BMI There is no height or weight on file to calculate BMI.     GEN: Well nourished, well developed, in no acute distress. HEENT: normal. Neck: Supple, no JVD, carotid bruits, or masses. Cardiac: RRR, no murmurs, rubs, or gallops. No clubbing, cyanosis, edema.  Radials/DP/PT 2+ and equal bilaterally.  Respiratory:  Respirations regular and unlabored, clear to auscultation bilaterally. GI: Soft, nontender, nondistended, BS + x 4. MS: no deformity or atrophy. Skin: warm and dry, no rash. Neuro:  Strength and sensation are intact. Psych: Normal affect.      Lab Results  Component Value Date   WBC 5.7 02/17/2023   HGB 10.8 (L) 02/17/2023    HCT 33.2 (L) 02/17/2023   MCV 87.1 02/17/2023   PLT 197 02/17/2023   Lab Results  Component Value Date   CREATININE 3.19 (H) 02/17/2023   BUN 37 (H) 02/17/2023   NA 131 (L) 02/17/2023   K 3.3 (L) 02/17/2023   CL 96 (L) 02/17/2023   CO2 21 (L) 02/17/2023   Lab Results  Component Value Date   ALT 26 02/12/2023   AST 33 02/12/2023   ALKPHOS 87 02/12/2023   BILITOT 0.6 02/12/2023   Lab Results  Component Value Date   CHOL 128 02/12/2023   HDL 46 02/12/2023   LDLCALC 61 02/12/2023   TRIG 106 02/12/2023   CHOLHDL 2.8 02/12/2023    Lab Results  Component Value Date   HGBA1C 6.0 (H) 02/12/2023     Review of Prior Studies    TTE 02/13/2023: Impressions: 1. There is no left ventricular thrombus (Definity contrast was used).  There is severe septal-lateral dyssynchrony due to LBBB. Left ventricular  ejection fraction, by estimation, is 35 to 40%. The left ventricle has  moderately decreased function. The  left ventricle demonstrates global hypokinesis. The left ventricular  internal cavity size was mildly dilated. There is mild concentric left  ventricular hypertrophy. Indeterminate diastolic filling due to E-A  fusion.   2. Right ventricular systolic function is moderately reduced. The right  ventricular size is moderately enlarged. There is moderately elevated  pulmonary artery systolic pressure. The estimated right ventricular  systolic pressure is 57.3 mmHg.   3. Left atrial size was severely dilated.   4. Right atrial size was moderately dilated.   5.  The mitral valve has been repaired/replaced. No evidence of mitral  valve regurgitation. Severe mitral stenosis. The mean mitral valve  gradient is 18.0 mmHg with average heart rate of 95 bpm. There is a 31 mm  Edwards Sapien bioprosthetic valve present   in the mitral position. Procedure Date: 09/05/2015. Echo findings are  consistent with stenosis the mitral prosthesis.   6. Large pleural effusion.   7. Tricuspid  valve regurgitation is moderate to severe.   8. The aortic valve is tricuspid. There is moderate calcification of the  aortic valve. There is moderate thickening of the aortic valve. Aortic  valve regurgitation is moderate. Moderate aortic valve stenosis. Aortic  regurgitation PHT measures 319 msec.  Aortic valve mean gradient measures 23.3 mmHg. Aortic valve Vmax measures  3.21 m/s.   9. Aortic dilatation noted. There is mild dilatation of the ascending  aorta, measuring 39 mm. There is mild dilatation of the aortic root,  measuring 42 mm.  10. Hepatic vein flow reversal is seen is seen intermittently (likely with  respiration, spirometry was not performed). The inferior vena cava is  dilated in size with >50% respiratory variability, suggesting right atrial  pressure of 8 mmHg.   Comparison(s): The left ventricular function is worsened. There is marked  increase in mitral valve prosthesis gradients, which may be partly related  to the increase in heart rate, but could also be due to prosthetic valve  endocarditis. Pulmonary artery  hypertension is worse, likley due to increase left heart pressures. Aortic  stenosis is only slightly worse.  _____________   Right/ Left Cardiac Catheterization 02/14/2023:   Mid LM to Ost LAD lesion is 40% stenosed.   Ramus lesion is 100% stenosed.   Ost LAD to Prox LAD lesion is 80% stenosed.  Previously placed Mid LAD lesion is 100% stenosed.   Previously placed Ost Cx to Prox Cx stent of unknown type is  widely patent.   Prox Cx to Mid Cx lesion is 65% stenosed.   1st Mrg lesion is 50% stenosed.   Previously placed Prox RCA lesion is 40% stenosed -focal ISR.   Mid RCA lesion is 85% stenosed.   Mid RCA to Dist RCA lesion is 20% stenosed.  Dist RCA lesion is 20% stenosed.   SVG-RI graft was visualized by angiography and is large.  The graft exhibits no disease.   LIMA-LAD graft was visualized by angiography and is large.  The graft exhibits no  disease. -----------------------------------------------------------   Hemodynamic findings consistent with severe pulmonary hypertension.   There is moderate aortic valve stenosis.   There is severe mitral valve stenosis.   Severe multivessel CAD: -> Distal LM 40%, ostial LAD 80% followed by short normal segment with SP and small to Diag branch then 100% occluded stent 100% ostial RI occlusion\ Widely patent ostial-proximal LCx stent with ~65% stenosis just distal to the stent and prior to OM/AVG bifurcation with trivial disease in the OM branches 40% ostial RCA with heavily calcified RCA (difficult to assess stent versus calcification. The stent appears to be in the proximal to mid segment followed by a 85% stenosis in the mid RCA. -> Widely patent large LIMA to mid LAD with retrograde filling of the 2nd Diag branch; widely patent SVG-RI Right Heart Cath/Hemodynamics: RA P 13 mmHg; RVP-EDP 65/3-11 mmHg; PAP-mean 60/35-45 mmHg, PCWP 40 to 44 mmHg (V waves appeared more prominent), LV P-EDP 150/6-14 mmHg, AO P-MAP 114/64-82 mmHg Wedge-LVEDP gradient ~30 mmHg; AoV mean gradient 35 mmHg Ao sat  88%, PA sat 56%; (FICK) Cardiac Output-Index 6.76-2.94    Recommendations: Optimization of medical and fluid management Will likely need staged imaging-guided PCI of the RCA with potential FFR guided PCI to the LCx as well => discussion with Dr. Lennie Odor, this will need to be done after having his dialysis fistula cleared and dialysis catheter removed (this would prevent having to hold Plavix).  Diagnostic Dominance: Right  _______________   TEE 02/15/2023: Impressions: 1. Left ventricular ejection fraction, by estimation, is 30%. The left  ventricle has moderately decreased function. The left ventricle  demonstrates global hypokinesis and abnormal septal motion secondary to  conduction delay.   2. Right ventricular systolic function is moderately reduced. The right  ventricular size is  normal.   3. Left atrial size was severely dilated. No left atrial/left atrial  appendage thrombus was detected. The LAA emptying velocity was 25 cm/s.   4. Right atrial size was moderately dilated.   5. The mitral valve has been repaired/replaced. Mild mitral valve  regurgitation. Severe prosthetic mitral valve stenosis. Prosthetic valve  leaflets are calcified and thickened with restricted opening. No definite  valvular vegetations, no dehiscence of  the prosthesis. The mean mitral valve gradient is 13.0 mmHg at HR 83 bpm.  There is a 31 mm Edwards-Sapien bioprosthetic valve present in the mitral  position. Procedure Date: 09/05/2015. Echo findings are consistent with  stenosis the mitral prosthesis.   6. There is a calcified small lesion on the atrial aspect of the  tricuspid valve, likely anterior leaflet best seen at 45 deg (clip 86 and  90). This could represent degenerative calcium vs healed calcified  vegetation. Patient has had a previous RV lead.   7. The aortic valve is calcified. There is severe calcifcation of the  aortic valve. There is moderate thickening of the aortic valve. Aortic  valve regurgitation is moderate, vena contracta 0.5 cm. Moderate aortic  valve stenosis. Aortic valve area, by  VTI measures 1.32 cm. Aortic valve mean gradient measures 24.0 mmHg.  Aortic valve Vmax measures 3.11 m/s.   8. Large pleural effusion.   9. Aortic dilatation noted. There is mild dilatation of the ascending  aorta, measuring 42 mm. There is Moderate (Grade III) plaque.  _______________ Assessment & Plan   1.  ***     {Are you ordering a CV Procedure (e.g. stress test, cath, DCCV, TEE, etc)?   Press F2        :[id number]}   Signed, Bettey Mare. Liborio Nixon, ANP, AACC   02/21/2023 3:03 PM      Office [phone] Fax [phone]  Notice: This dictation was prepared with Dragon dictation along with smaller phrase technology. Any transcriptional errors that result from this  process are unintentional and may not be corrected upon review.

## 2023-02-23 ENCOUNTER — Ambulatory Visit: Payer: No Typology Code available for payment source | Admitting: Adult Health

## 2023-03-05 ENCOUNTER — Ambulatory Visit: Payer: No Typology Code available for payment source | Attending: Adult Health | Admitting: Physician Assistant

## 2023-03-05 NOTE — Progress Notes (Unsigned)
This encounter was created in error - please disregard.

## 2023-05-29 ENCOUNTER — Other Ambulatory Visit (HOSPITAL_COMMUNITY): Payer: Self-pay
